# Patient Record
Sex: Female | Born: 1940 | Race: Black or African American | Hispanic: No | Marital: Single | Smoking: Former smoker
Health system: Southern US, Community
[De-identification: ages and names within clinical notes are randomized; demographics above are authoritative.]

## PROBLEM LIST (undated history)

## (undated) DIAGNOSIS — I10 Essential (primary) hypertension: Secondary | ICD-10-CM

## (undated) DIAGNOSIS — D494 Neoplasm of unspecified behavior of bladder: Secondary | ICD-10-CM

## (undated) DIAGNOSIS — Z862 Personal history of diseases of the blood and blood-forming organs and certain disorders involving the immune mechanism: Secondary | ICD-10-CM

## (undated) DIAGNOSIS — E785 Hyperlipidemia, unspecified: Secondary | ICD-10-CM

## (undated) DIAGNOSIS — R0600 Dyspnea, unspecified: Secondary | ICD-10-CM

## (undated) DIAGNOSIS — K219 Gastro-esophageal reflux disease without esophagitis: Secondary | ICD-10-CM

## (undated) DIAGNOSIS — T7840XA Allergy, unspecified, initial encounter: Secondary | ICD-10-CM

## (undated) DIAGNOSIS — Z8639 Personal history of other endocrine, nutritional and metabolic disease: Secondary | ICD-10-CM

## (undated) DIAGNOSIS — H269 Unspecified cataract: Secondary | ICD-10-CM

## (undated) DIAGNOSIS — I739 Peripheral vascular disease, unspecified: Secondary | ICD-10-CM

## (undated) DIAGNOSIS — M1990 Unspecified osteoarthritis, unspecified site: Secondary | ICD-10-CM

## (undated) DIAGNOSIS — M25569 Pain in unspecified knee: Secondary | ICD-10-CM

## (undated) DIAGNOSIS — J309 Allergic rhinitis, unspecified: Secondary | ICD-10-CM

## (undated) DIAGNOSIS — Z86010 Personal history of colonic polyps: Secondary | ICD-10-CM

## (undated) DIAGNOSIS — Z923 Personal history of irradiation: Secondary | ICD-10-CM

## (undated) HISTORY — PX: TOTAL ABDOMINAL HYSTERECTOMY: SHX209

## (undated) HISTORY — PX: BREAST SURGERY: SHX581

## (undated) HISTORY — DX: Hyperlipidemia, unspecified: E78.5

## (undated) HISTORY — DX: Personal history of diseases of the blood and blood-forming organs and certain disorders involving the immune mechanism: Z86.2

## (undated) HISTORY — DX: Allergy, unspecified, initial encounter: T78.40XA

## (undated) HISTORY — PX: TONSILLECTOMY: SHX5217

## (undated) HISTORY — PX: DILATION AND CURETTAGE OF UTERUS: SHX78

## (undated) HISTORY — DX: Essential (primary) hypertension: I10

## (undated) HISTORY — PX: COLONOSCOPY: SHX174

## (undated) HISTORY — DX: Personal history of other endocrine, nutritional and metabolic disease: Z86.39

## (undated) HISTORY — DX: Allergic rhinitis, unspecified: J30.9

## (undated) HISTORY — DX: Unspecified cataract: H26.9

## (undated) HISTORY — DX: Unspecified osteoarthritis, unspecified site: M19.90

## (undated) HISTORY — DX: Personal history of colonic polyps: Z86.010

---

## 1961-10-16 HISTORY — PX: TONSILLECTOMY: SHX5217

## 1973-10-16 HISTORY — PX: BREAST EXCISIONAL BIOPSY: SUR124

## 1998-01-25 ENCOUNTER — Ambulatory Visit (HOSPITAL_COMMUNITY): Admission: RE | Admit: 1998-01-25 | Discharge: 1998-01-25 | Payer: Self-pay | Admitting: Obstetrics & Gynecology

## 2000-07-19 ENCOUNTER — Encounter: Admission: RE | Admit: 2000-07-19 | Discharge: 2000-07-19 | Payer: Self-pay | Admitting: Internal Medicine

## 2000-07-19 ENCOUNTER — Encounter: Payer: Self-pay | Admitting: Internal Medicine

## 2001-06-25 ENCOUNTER — Other Ambulatory Visit: Admission: RE | Admit: 2001-06-25 | Discharge: 2001-06-25 | Payer: Self-pay | Admitting: Obstetrics & Gynecology

## 2001-07-23 ENCOUNTER — Encounter: Admission: RE | Admit: 2001-07-23 | Discharge: 2001-07-23 | Payer: Self-pay | Admitting: Internal Medicine

## 2001-07-23 ENCOUNTER — Encounter: Payer: Self-pay | Admitting: Internal Medicine

## 2001-09-03 ENCOUNTER — Encounter: Payer: Self-pay | Admitting: Obstetrics & Gynecology

## 2001-09-04 ENCOUNTER — Encounter (INDEPENDENT_AMBULATORY_CARE_PROVIDER_SITE_OTHER): Payer: Self-pay

## 2001-09-04 ENCOUNTER — Inpatient Hospital Stay (HOSPITAL_COMMUNITY): Admission: RE | Admit: 2001-09-04 | Discharge: 2001-09-07 | Payer: Self-pay | Admitting: Obstetrics & Gynecology

## 2002-07-25 ENCOUNTER — Encounter: Admission: RE | Admit: 2002-07-25 | Discharge: 2002-07-25 | Payer: Self-pay | Admitting: Internal Medicine

## 2002-07-25 ENCOUNTER — Encounter: Payer: Self-pay | Admitting: Internal Medicine

## 2003-07-28 ENCOUNTER — Encounter: Admission: RE | Admit: 2003-07-28 | Discharge: 2003-07-28 | Payer: Self-pay | Admitting: Internal Medicine

## 2003-07-28 ENCOUNTER — Encounter: Payer: Self-pay | Admitting: Internal Medicine

## 2004-07-28 ENCOUNTER — Encounter: Admission: RE | Admit: 2004-07-28 | Discharge: 2004-07-28 | Payer: Self-pay | Admitting: Internal Medicine

## 2004-11-04 ENCOUNTER — Ambulatory Visit: Payer: Self-pay | Admitting: Internal Medicine

## 2005-02-15 ENCOUNTER — Ambulatory Visit: Payer: Self-pay | Admitting: Internal Medicine

## 2005-06-06 ENCOUNTER — Ambulatory Visit: Payer: Self-pay | Admitting: Internal Medicine

## 2005-06-13 ENCOUNTER — Ambulatory Visit: Payer: Self-pay | Admitting: Internal Medicine

## 2005-06-16 ENCOUNTER — Ambulatory Visit: Payer: Self-pay | Admitting: Internal Medicine

## 2005-09-13 ENCOUNTER — Encounter: Admission: RE | Admit: 2005-09-13 | Discharge: 2005-09-13 | Payer: Self-pay | Admitting: Internal Medicine

## 2006-09-18 ENCOUNTER — Encounter: Admission: RE | Admit: 2006-09-18 | Discharge: 2006-09-18 | Payer: Self-pay | Admitting: Internal Medicine

## 2006-09-26 ENCOUNTER — Encounter: Admission: RE | Admit: 2006-09-26 | Discharge: 2006-09-26 | Payer: Self-pay | Admitting: Internal Medicine

## 2007-06-06 DIAGNOSIS — Z860100 Personal history of colon polyps, unspecified: Secondary | ICD-10-CM

## 2007-06-06 DIAGNOSIS — I10 Essential (primary) hypertension: Secondary | ICD-10-CM | POA: Insufficient documentation

## 2007-06-06 HISTORY — DX: Essential (primary) hypertension: I10

## 2007-06-06 HISTORY — DX: Personal history of colon polyps, unspecified: Z86.0100

## 2007-06-06 HISTORY — DX: Personal history of colonic polyps: Z86.010

## 2007-09-24 ENCOUNTER — Encounter: Admission: RE | Admit: 2007-09-24 | Discharge: 2007-09-24 | Payer: Self-pay | Admitting: Internal Medicine

## 2008-09-24 ENCOUNTER — Encounter: Admission: RE | Admit: 2008-09-24 | Discharge: 2008-09-24 | Payer: Self-pay | Admitting: Internal Medicine

## 2009-09-20 ENCOUNTER — Ambulatory Visit: Payer: Self-pay | Admitting: Internal Medicine

## 2009-09-20 DIAGNOSIS — J309 Allergic rhinitis, unspecified: Secondary | ICD-10-CM | POA: Insufficient documentation

## 2009-09-20 DIAGNOSIS — E785 Hyperlipidemia, unspecified: Secondary | ICD-10-CM

## 2009-09-20 HISTORY — DX: Hyperlipidemia, unspecified: E78.5

## 2009-09-20 HISTORY — DX: Allergic rhinitis, unspecified: J30.9

## 2009-09-21 DIAGNOSIS — Z862 Personal history of diseases of the blood and blood-forming organs and certain disorders involving the immune mechanism: Secondary | ICD-10-CM

## 2009-09-21 DIAGNOSIS — Z8639 Personal history of other endocrine, nutritional and metabolic disease: Secondary | ICD-10-CM | POA: Insufficient documentation

## 2009-09-21 HISTORY — DX: Personal history of diseases of the blood and blood-forming organs and certain disorders involving the immune mechanism: Z86.2

## 2009-09-21 HISTORY — DX: Personal history of diseases of the blood and blood-forming organs and certain disorders involving the immune mechanism: Z86.39

## 2009-09-21 LAB — CONVERTED CEMR LAB
ALT: 108 units/L — ABNORMAL HIGH (ref 0–35)
AST: 196 units/L — ABNORMAL HIGH (ref 0–37)
Albumin: 4.2 g/dL (ref 3.5–5.2)
Basophils Relative: 0.9 % (ref 0.0–3.0)
Calcium: 9.7 mg/dL (ref 8.4–10.5)
Chloride: 102 meq/L (ref 96–112)
Eosinophils Relative: 0.4 % (ref 0.0–5.0)
GFR calc non Af Amer: 106.67 mL/min (ref >60)
Glucose, Bld: 109 mg/dL — ABNORMAL HIGH (ref 70–99)
HDL: 74.9 mg/dL (ref >39.00)
Hemoglobin: 16.4 g/dL — ABNORMAL HIGH (ref 12.0–15.0)
Lymphs Abs: 1.6 10*3/uL (ref 0.7–4.0)
Monocytes Absolute: 0.7 10*3/uL (ref 0.1–1.0)
Monocytes Relative: 7.4 % (ref 3.0–12.0)
Potassium: 4 meq/L (ref 3.5–5.1)
RDW: 12 % (ref 11.5–14.6)
TSH: 1.89 microintl units/mL (ref 0.35–5.50)
Total CHOL/HDL Ratio: 3
Total Protein: 8.3 g/dL (ref 6.0–8.3)
Triglycerides: 103 mg/dL (ref 0.0–149.0)
VLDL: 20.6 mg/dL (ref 0.0–40.0)

## 2009-09-22 ENCOUNTER — Ambulatory Visit: Payer: Self-pay | Admitting: Internal Medicine

## 2009-09-22 LAB — CONVERTED CEMR LAB
HCV Ab: NEGATIVE
Hep B S Ab: NEGATIVE

## 2009-09-27 ENCOUNTER — Encounter: Admission: RE | Admit: 2009-09-27 | Discharge: 2009-09-27 | Payer: Self-pay | Admitting: Internal Medicine

## 2009-10-14 ENCOUNTER — Encounter: Admission: RE | Admit: 2009-10-14 | Discharge: 2009-10-14 | Payer: Self-pay | Admitting: Internal Medicine

## 2009-10-20 ENCOUNTER — Ambulatory Visit: Payer: Self-pay | Admitting: Internal Medicine

## 2010-03-04 ENCOUNTER — Telehealth: Payer: Self-pay | Admitting: Internal Medicine

## 2010-04-01 ENCOUNTER — Encounter (INDEPENDENT_AMBULATORY_CARE_PROVIDER_SITE_OTHER): Payer: Self-pay | Admitting: *Deleted

## 2010-04-06 ENCOUNTER — Ambulatory Visit: Payer: Self-pay | Admitting: Internal Medicine

## 2010-04-20 ENCOUNTER — Ambulatory Visit: Payer: Self-pay | Admitting: Internal Medicine

## 2010-04-20 LAB — HM COLONOSCOPY

## 2010-04-28 ENCOUNTER — Encounter: Payer: Self-pay | Admitting: Internal Medicine

## 2010-09-28 ENCOUNTER — Encounter
Admission: RE | Admit: 2010-09-28 | Discharge: 2010-09-28 | Payer: Self-pay | Source: Home / Self Care | Attending: Internal Medicine | Admitting: Internal Medicine

## 2010-11-15 NOTE — Miscellaneous (Signed)
Summary: [facility]evisit/prep  Clinical Lists Changes  Medications: Added new medication of MOVIPREP 100 GM  SOLR (PEG-KCL-NACL-NASULF-NA ASC-C) As per prep instructions. - Signed Rx of MOVIPREP 100 GM  SOLR (PEG-KCL-NACL-NASULF-NA ASC-C) As per prep instructions.;  #1 x 0;  Signed;  Entered by: Wyona Almas RN;  Authorized by: Iva Boop MD, [facility];  Method used: [facility] [address] [id number]*, [address] [place], [place], [place]  [id number], Ph: [phone] or [phone], Fax: [phone] Allergies: Changed allergy or adverse reaction from DOXYCYCLINE to DOXYCYCLINE Observations: Added new observation of ALLERGY REV: Done (04/06/2010 13:58)    Prescriptions: MOVIPREP 100 GM  SOLR (PEG-KCL-NACL-NASULF-NA ASC-C) As per prep instructions.  #1 x 0   Entered by:   Wyona Almas RN   Authorized by:   Iva Boop MD, [facility]   Signed by:   Wyona Almas RN on 04/06/2010   Method used:   [facility] [phone][facility] (retail)       [address]       [place], [place]  [id number]       Ph: [phone] or [phone]       Fax: [phone]   RxID:   [id number]

## 2010-11-15 NOTE — Progress Notes (Signed)
Summary: Schedule Recall Colonosopy   Phone Note Outgoing Call   Call placed by: Lamona Curl CMA Duncan Dull),  Mar 04, 2010 11:32 AM Call placed to: Patient Summary of Call: Patient is overdue for colonoscopy at this time. Her last colonoscopy in January 2005 showed diverticulosis and a polyp which was biopsied and found to be adenomatous. I have left a message for patient to call back.  Initial call taken by: Lamona Curl CMA Duncan Dull),  Mar 04, 2010 11:34 AM  Follow-up for Phone Call        Patient has been scheduled for June 22 for her previsit and July 6 for her colonoscopy. Follow-up by: Lamona Curl CMA Duncan Dull),  Mar 10, 2010 8:05 AM

## 2010-11-15 NOTE — Procedures (Signed)
Summary: Colonoscopy  Patient: Tracy Davidson Note: All result statuses are Final unless otherwise noted.  Tests: (1) Colonoscopy (COL)   COL Colonoscopy           DONE     [facility]     [address]     [place], [place]  [address]           COLONOSCOPY PROCEDURE REPORT           PATIENT:  Tracy Davidson, Tracy Davidson  MR#:  [id number]     BIRTHDATE:  02-23-1941, 68 yrs. old  GENDER:  female     ENDOSCOPIST:  Iva Boop, MD, [facility]           PROCEDURE DATE:  04/20/2010     PROCEDURE:  Colonoscopy with biopsy and snare polypectomy     ASA CLASS:  Class II     INDICATIONS:  surveillance and high-risk screening, history of     pre-cancerous (adenomatous) colon polyps diminutive adenoma     removed January 2005     MEDICATIONS:   Fentanyl 50 mcg IV, Versed 5 mg           DESCRIPTION OF PROCEDURE:   After the risks benefits and     alternatives of the procedure were thoroughly explained, informed     consent was obtained.  Digital rectal exam was performed and     revealed no abnormalities.   The Pentax Colonoscope [id number]     endoscope was introduced through the anus and advanced to the     cecum, which was identified by both the appendix and ileocecal     valve, without limitations.  The quality of the prep was     excellent, using MoviPrep.  The instrument was then slowly     withdrawn as the colon was fully examined. Insertion: 1:54 minutes     Withdrawal: 10:30 minutes.     <<PROCEDUREIMAGES>>           FINDINGS:  Five polyps were found. Cecum (2mm), descending (5 and     7 mm), sigmoid (4mm) and rectum (2mm). The polyps were removed     using cold biopsy forceps and were snared without cautery.     Retrieval was successful. AVM changes seen in cecum. Not bleeding     and up to 5mm size.  Mild diverticulosis was found in the sigmoid     colon.  Scattered diverticula were found in the right colon.  This     was otherwise a normal examination of the colon.   Retroflexed   views in the rectum revealed internal and external hemorrhoids.     The scope was then withdrawn from the patient and the procedure     completed.           COMPLICATIONS:  None     ENDOSCOPIC IMPRESSION:     1) Five polyps removed, 2-27mm.     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  REPEAT EXAM:  In for Colonoscopy, pending biopsy results.           Iva Boop, MD, [facility]           CC:  The Patient           n.     eSIGNED:   Iva Boop at 04/20/2010 11:24 AM           Page 2 of 3   Tracy Davidson, Tracy Davidson, [id number]  Note: An exclamation mark (!) indicates a result that was not dispersed into the flowsheet. Document Creation Date: 04/20/2010 11:24 AM _______________________________________________________________________  (1) Order result status: Final Collection or observation date-time: 04/20/2010 11:13 Requested date-time:  Receipt date-time:  Reported date-time:  Referring Physician:   Ordering Physician: Stan Head [phone]) Specimen Source:  Source: Launa Grill Order Number: [id number] Lab site:   Appended Document: Colonoscopy     Procedures Next Due Date:    Colonoscopy: 04/2013  Appended Document: Colonoscopy     Procedures Next Due Date:    Colonoscopy: 04/2013

## 2010-11-15 NOTE — Letter (Signed)
Summary: Patient Notice- Polyp Results  [facility]  [address] [place], [place] [address]   Phone: [phone]  Fax: [phone]        April 28, 2010 MRN: [id number]    [facility] [address] [place], [place]  [address]    Dear Ms. Follmer,  The polyps removed from your colon were adenomatous. This means that they were pre-cancerous or that  they had the potential to change into cancer over time.   I recommend that you have a repeat colonoscopy in 3 years to determine if you have developed any new polyps over time. If you develop any new rectal bleeding, abdominal pain or significant bowel habit changes, please contact us before then.  Please call us if you are having persistent problems or have questions about your condition that have not been fully answered at this time.  Sincerely,  Iva Boop MD, [facility]  This letter has been electronically signed by your physician.  Appended Document: Patient Notice- Polyp Results letter mailed.

## 2010-11-15 NOTE — Letter (Signed)
Summary: [facility] Instructions  [facility]  [address] [place], [place] [address]   Phone: [phone]  Fax: [phone]       ANISSIA WESSELLS    1941-07-20    MRN: [id number]        Procedure Day Dorna Bloom:  [facility]  04/20/10     Arrival Time:  9:30AM     Procedure Time:  10:30AM     Location of Procedure:                    _ X_  [facility] (4th Floor)                        PREPARATION FOR COLONOSCOPY WITH MOVIPREP   Starting 5 days prior to your procedure 04/15/10 do not eat nuts, seeds, popcorn, corn, beans, peas,  salads, or any raw vegetables.  Do not take any fiber supplements (e.g. Metamucil, Citrucel, and Benefiber).  THE DAY BEFORE YOUR PROCEDURE         DATE: 04/19/10  DAY: TUESDAY  1.  Drink clear liquids the entire day-NO SOLID FOOD  2.  Do not drink anything colored red or purple.  Avoid juices with pulp.  No orange juice.  3.  Drink at least 64 oz. (8 glasses) of fluid/clear liquids during the day to prevent dehydration and help the prep work efficiently.  CLEAR LIQUIDS INCLUDE: Water Jello Ice Popsicles Tea (sugar ok, no milk/cream) Powdered fruit flavored drinks Coffee (sugar ok, no milk/cream) Gatorade Juice: apple, white grape, white cranberry  Lemonade Clear bullion, consomm, broth Carbonated beverages (any kind) Strained chicken noodle soup Hard Candy                             4.  In the morning, mix first dose of MoviPrep solution:    Empty 1 Pouch A and 1 Pouch B into the disposable container    Add lukewarm drinking water to the top line of the container. Mix to dissolve    Refrigerate (mixed solution should be used within 24 hrs)  5.  Begin drinking the prep at 5:00 p.m. The MoviPrep container is divided by 4 marks.   Every 15 minutes drink the solution down to the next mark (approximately 8 oz) until the full liter is complete.   6.  Follow completed prep with 16 oz of clear liquid of your choice  (Nothing red or purple).  Continue to drink clear liquids until bedtime.  7.  Before going to bed, mix second dose of MoviPrep solution:    Empty 1 Pouch A and 1 Pouch B into the disposable container    Add lukewarm drinking water to the top line of the container. Mix to dissolve    Refrigerate  THE DAY OF YOUR PROCEDURE      DATE: 04/20/10  DAY: WEDNESDAY  Beginning at 5:30AM (5 hours before procedure):         1. Every 15 minutes, drink the solution down to the next mark (approx 8 oz) until the full liter is complete.  2. Follow completed prep with 16 oz. of clear liquid of your choice.    3. You may drink clear liquids until 8:30AM (2 HOURS BEFORE PROCEDURE).   MEDICATION INSTRUCTIONS  Unless otherwise instructed, you should take regular prescription medications with a small sip of water   as early as possible the morning  of your procedure.  Additional medication instructions: Hold Hydrochlorothiazide the morning of procedure.         OTHER INSTRUCTIONS  You will need a responsible adult at least 71 years of age to accompany you and drive you home.   This person must remain in the waiting room during your procedure.  Wear loose fitting clothing that is easily removed.  Leave jewelry and other valuables at home.  However, you may wish to bring a book to read or  an iPod/MP3 player to listen to music as you wait for your procedure to start.  Remove all body piercing jewelry and leave at home.  Total time from sign-in until discharge is approximately 2-3 hours.  You should go home directly after your procedure and rest.  You can resume normal activities the  day after your procedure.  The day of your procedure you should not:   Drive   Make legal decisions   Operate machinery   Drink alcohol   Return to work  You will receive specific instructions about eating, activities and medications before you leave.    The above instructions have been reviewed and  explained to me by   Wyona Almas RN  April 06, 2010 2:23 PM     I fully understand and can verbalize these instructions _____________________________ Date _________

## 2010-11-15 NOTE — Assessment & Plan Note (Signed)
Summary: per PK   Vital Signs:  Patient profile:   70 year old female Weight:      170 pounds Temp:     98.6 degrees F oral BP sitting:   138 / 74  (left arm) Cuff size:   regular  Vitals Entered By: Raechel Ache, RN (October 20, 2009 11:15 AM) CC: Talk about LFT's. Is Patient Diabetic? No   CC:  Talk about LFT's..  History of Present Illness: 70 year old patient who is seen today for follow-up of her elevated liver function studies.  A chronic hepatitis screen has been negative as has an abdominal ultrasound.  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  She has hypertension, history of colonic polyps, and dyslipidemia.  Her only medication is hydrochlorothiazide.  She feels well today.  Allergies: 1)  ! Doxycycline  Past History:  Past Medical History: Reviewed history from 09/20/2009 and no changes required. Bipolar High Cholesterol Colonic polyps, hx of Hypertension Allergic rhinitis  Physical Exam  General:  Well-developed,well-nourished,in no acute distress; alert,appropriate and cooperative throughout examination Head:  Normocephalic and atraumatic without obvious abnormalities. No apparent alopecia or balding. Eyes:  No corneal or conjunctival inflammation noted. EOMI. Perrla. Funduscopic exam benign, without hemorrhages, exudates or papilledema. Vision grossly normal. Mouth:  Oral mucosa and oropharynx without lesions or exudates.  Teeth in good repair. Neck:  No deformities, masses, or tenderness noted. Lungs:  Normal respiratory effort, chest expands symmetrically. Lungs are clear to auscultation, no crackles or wheezes. Heart:  Normal rate and regular rhythm. S1 and S2 normal without gallop, murmur, click, rub or other extra sounds. Abdomen:  Bowel sounds positive,abdomen soft and non-tender without masses, organomegaly or hernias noted.   Impression & Recommendations:  Problem # 1:  LIVER FUNCTION TESTS, ABNORMAL, HX OF (ICD-V12.2)  Problem #  2:  HYPERTENSION (ICD-401.9)  Her updated medication list for this problem includes:    Hydrochlorothiazide 25 Mg Tabs (Hydrochlorothiazide) .Marland Kitchen... 1 once daily  Her updated medication list for this problem includes:    Hydrochlorothiazide 25 Mg Tabs (Hydrochlorothiazide) .Marland Kitchen... 1 once daily  Complete Medication List: 1)  Hydrochlorothiazide 25 Mg Tabs (Hydrochlorothiazide) .Marland Kitchen.. 1 once daily  Patient Instructions: 1)  Please schedule a follow-up appointment in 3 months. 2)  Limit your Sodium (Salt). 3)  It is important that you exercise regularly at least 20 minutes 5 times a week. If you develop chest pain, have severe difficulty breathing, or feel very tired , stop exercising immediately and seek medical attention. 4)  discontinue alsoholic products

## 2010-12-13 ENCOUNTER — Other Ambulatory Visit: Payer: Self-pay | Admitting: Internal Medicine

## 2011-02-08 ENCOUNTER — Other Ambulatory Visit: Payer: Self-pay | Admitting: Internal Medicine

## 2011-03-03 NOTE — Discharge Summary (Signed)
[facility]  Patient:    Tracy Davidson, Tracy Davidson Visit Number: [id number] MRN: [id number]          Service Type: GYN Location: 9300 [id number] Attending Physician:  Mickle Mallory Dictated by:   Gerrit Friends. Aldona Bar, M.D. Admit Date:  09/04/2001 Discharge Date: 09/07/2001                             Discharge Summary  DISCHARGE DIAGNOSIS:          Persistent postmenopausal bleeding secondary                               to 8-cm submucosal leiomyoma.  PROCEDURES:                   1. Total abdominal hysterectomy.                               2. Bilateral salpingo-oophorectomy.  SURGEON:                      Gerrit Friends. Aldona Bar, M.D.  HISTORY:                      This 70 year old patient was admitted for persistent postmenopausal bleeding suspected to be coming from a large submucosal leiomyoma. She was not on hormone replacement.  HOSPITAL COURSE:              After a normal preoperative workup, she underwent a total abdominal hysterectomy and bilateral salpingo-oophorectomy on November 20 which was carried out without difficulty. Pathologic specimen revealed an 8-cm submucosal leiomyoma and an endometrial polyp. Tubes and ovaries and the remainder of the specimen was normal. The patients postoperative course was relatively uncomplicated. Her discharge hemoglobin was 14.2 with a white count of 12,800 and platelet count of 182,000. She did well on November 21. 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 She was placed on Atarax with improvement and on the morning of November 23, was ambulating well, tolerating a regular diet well, having minimal dizziness, was having normal bowel and bladder functions, her wound was clean and dry, her vital signs were stable, and she was deemed ready for discharge.  DISCHARGE MEDICATIONS:        1. Darvocet-N 100 one  every four hours as needed                                  for pain.                               2. Atarax 25 mg one every six to eight hours as                                  needed for dizziness.  DISCHARGE FOLLOWUP:           The patient is to return to the office for followup in approximately four weeks time. She was given all careful explicit instructions at the time  of discharge and understood all instructions well.  CONDITION ON DISCHARGE:       Improved. Dictated by:   Gerrit Friends. Aldona Bar, M.D. Attending Physician:  Mickle Mallory DD:  09/07/01 TD:  09/07/01 Job: [id number]

## 2011-03-03 NOTE — H&P (Signed)
[facility]  Patient:    Tracy, Davidson Visit Number: [id number] MRN: [id number]          Service Type: Attending:  Gerrit Friends. Aldona Bar, M.D. Dictated by:   Gerrit Friends. Aldona Bar, M.D. Adm. Date:  09/04/01                           History and Physical  OFFICE NUMBER:                [id number]  REASON FOR ADMISSION:         This patient is being admitted after surgery on Wednesday, September 04, 2001, at [facility].  Please try to have this history and physical to the Day Surgery Admitting area on the morning of September 04, 2001.  HISTORY:                      Tracy Davidson is a 70 year old gravida 1, para 0, aborta 1, black female, admitted with a persistent minimal postmenopausal bleeding for a total abdominal hysterectomy and bilateral salpingo-oophorectomy.  This patient has known fibroids and is on no hormone replacement therapy and has been on no hormone replacement therapy for at least five years and continues to have some intermittent spotting which, on evaluation, has always been benign.  She has had two ultrasounds done, the most recent of which was at [facility] in April 1999.  At that time, a sonohysterogram was done, and there was a submucosal fibroid within the endometrial cavity, and the endometrial lining was noted to be thin, and normal ovaries were seen.  It is the fact that she is postmenopausal, not on any hormone replacement, and having continued postmenopausal spotting that she is undergoing this procedure.  Her most recent Pap smear is September 2002 was normal, and a mammogram was done October 2002, which reportedly, according to the patient, was normal.  She has been a patient at [facility] for a number of years - dating back to 1974.  I have been seeing her personally since 67.  Concurrently, she also sees Dr. Eleonore Chiquito in the [facility] and prior to that, was a patient of Dr. Rhys Martini  and Dr. Leanora Ivanoff when they were in private practice together.  Her only medical problem is hypertension which is controlled with hydrochlorothiazide 25 mg daily.  She is on no other medications.  She has had no other serious illnesses.  ALLERGIES:                    She has essentially no known allergies.  HABITS:                       She does smoke socially and does use alcohol socially.  PAST MEDICAL HISTORY:         Essentially negative with the exception of above.  FAMILY HISTORY:               Noncontributory.  REVIEW OF SYSTEMS:            Noncontributory.  PHYSICAL EXAMINATION AT ADMISSION:  GENERAL:                      Well-developed female.  VITAL SIGNS:                  Blood pressure 140/70, pulse 80 and regular, respirations 18  and regular, temperature 98.2, weight 183, height 5 feet 5 inches.  HEENT:                        Negative.  NECK:                         Thyroid not enlarged.  CHEST:                        Clear to auscultation and percussion.  CARDIOVASCULAR:               Regular rate and rhythm with no murmur.  BREASTS:                      Negative.  ABDOMEN:                      Unremarkable.  PELVIC:                       External genitalia, BUS normal.  Introitus by digital, vault fairly well-supported.  Cervix - no gross lesions.  Uterus is upper limits of normal size, deviated somewhat to the right.  Adnexal area is negative.  Rectovaginal confirmatory.  NEUROLOGIC:                   Physiologic.  IMPRESSION:                   1. Persistent postmenopausal bleeding.                               2. 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 degeneration.  PLAN:                         1. Total abdominal hysterectomy.                               2. Bilateral salpingo-oophorectomy. Dictated by:   Gerrit Friends. Aldona Bar, M.D. Attending:  Gerrit Friends. Aldona Bar, M.D. DD:  09/03/01 TD:  09/03/01 Job:  [id number]

## 2011-03-03 NOTE — Op Note (Signed)
[facility]  Patient:    Tracy Davidson, Tracy Davidson Visit Number: [id number] MRN: [id number]          Service Type: GYN Location: [id number] Attending Physician:  Mickle Mallory Dictated by:   Gerrit Friends. Aldona Bar, M.D. Proc. Date: 09/04/01 Admit Date:  09/04/2001                             Operative Report  PREOPERATIVE DIAGNOSES:       1. Persistent postmenopausal bleeding.                               2. Leiomyomatous uterus.  POSTOPERATIVE DIAGNOSES:      1. Persistent postmenopausal bleeding.                               2. Leiomyomatous uterus.                               3. Pathology pending.  PROCEDURE:                    Total abdominal hysterectomy with bilateral salpingo-oophorectomy.  SURGEON:                      Gerrit Friends. Aldona Bar, M.D.  ASSISTANT:                    Luvenia Redden, M.D.  ANESTHESIA:                   General endotracheal.  HISTORY:                      This 70 year old female who has not been on hormone replacement therapy for a number of years, continues to have postmenopausal spotting.  In spite of a totally normal evaluation with the exception of large leiomyoma, the problem continues.  Her workup has included a sonohystogram, a D&C, several ultrasounds, several endometrial biopsies, and even a course of Evista without success.  She is now taken to the operating room for total abdominal hysterectomy and bilateral salpingo-oophorectomy.  DESCRIPTION OF PROCEDURE:     The patient was taken to the operating room. After satisfactory induction of general endotracheal anesthesia, she was prepped and draped in the supine position with a Foley catheter inserted as part of the prep.  After the patient was adequately draped, the procedure was begun.  A Pfannenstiel incision was made and, with minimal difficulty, dissected down to and through the fascia in a low transverse fashion with hemostasis created at each  layer.  The subfascial space was created inferiorly and superiorly.  The muscles were separated in the midline.  The peritoneum was identified and entered appropriately, with care taken to avoid the bowel superiorly and the bladder inferiorly.  At this time, the abdomen was inspected.  The liver edge and the undersurface of the diaphragm felt normal, as did both kidneys.  The uterus was enlarged with what was felt to be a large posterior myoma.  The ovaries were atrophic.  The tubes were normal.  The appendix was totally normal.  At this time, the OConnor-OSullivan retractor was placed and bowel was packed off without difficulty.  Hysterectomy  at this time was begun in the usual fashion.  Two long Kelly clamps were placed at the cornua of the uterus and the uterus was elevated.  The round ligaments were tied off using 0 Vicryl.  Using the Bovie, the round ligaments were opened and rendered hemostatic, and a bladder flap was created anteriorly without difficulty.  At this time, the ureters were palpated and noted to be well out of the field. The infundibulopelvic pedicles were clamped, cut and doubly suture secured, removing both ovaries and tubes.  At this time, the anterior flap was created, pushing the bladder down off of the lower uterine segment.  The uterine artery pedicles at this time were clamped, cut and suture secured with 0 Vicryl suture.  Because of the large posterior myoma obstructing view, the fundal portion of the uterus was removed above the uterine artery pedicles.  This enabled great visualization. Thereafter, again assured that the bladder was pushed off of the lower segment, using straight Heaney clamps and suture ligature of 0 Vicryl, the parametrial pedicles were taken down to the vaginal angle and the uterosacrals were then clamped with a curved Heaney and suture secured with 0 Vicryl suture.  At this time, a stab wound was made anteriorly and, using the Satinsky  scissors, the cervix was circumferentially removed without difficulty.  Thereafter, the vaginal angles were secured using a figure-of-eight 0 Vicryl suture and the vaginal cuff was closed using figure-of-eight 0 Vicryl suture, as well.  Hemostasis was noted to be very adequate.  Again, the ureters were palpated and noted to be normal in their course.  With hemostasis very adequate and the procedure completed, removal of the packs and retractors was carried out.  Again, the appendix was visualized and palpated and noted to be completely normal.  At this time, with all counts being correct and no foreign bodies noted to be remaining in the abdominal cavity, closure of the abdomen was carried out in layers.  The abdominal peritoneum was closed with 2-0 Vicryl in a running fashion.  The muscles were secured with the same.  Assured of fascial hemostasis, the fascia was then reapproximated using 0 Vicryl from angle to midline bilaterally.  The subcu tissue was then rendered hemostatic and staples were then used to close the skin.  A sterile pressure dressing was applied.  At this time, the patient was transported to the recovery room in satisfactory condition, having tolerated the procedure well.  Estimated blood loss was 150 cc.  All counts were correct x 2.  The patient had good, clear urine output during the procedure and was taken to the recovery room in satisfactory condition.  PATHOLOGY SPECIMEN:           Uterus, tubes and cervix.  CONDITION ON ARRIVAL TO THE RECOVERY ROOM:         Satisfactory. Dictated by:   Gerrit Friends. Aldona Bar, M.D. Attending Physician:  Mickle Mallory DD:  09/04/01 TD:  09/04/01 Job: [id number]

## 2011-03-09 ENCOUNTER — Encounter: Payer: Self-pay | Admitting: Internal Medicine

## 2011-03-14 ENCOUNTER — Ambulatory Visit (INDEPENDENT_AMBULATORY_CARE_PROVIDER_SITE_OTHER): Payer: PRIVATE HEALTH INSURANCE | Admitting: Internal Medicine

## 2011-03-14 ENCOUNTER — Encounter: Payer: Self-pay | Admitting: Internal Medicine

## 2011-03-14 VITALS — BP 116/70 | HR 70 | Temp 98.5°F | Resp 18 | Ht 64.5 in | Wt 167.0 lb

## 2011-03-14 DIAGNOSIS — E785 Hyperlipidemia, unspecified: Secondary | ICD-10-CM

## 2011-03-14 DIAGNOSIS — Z860100 Personal history of colon polyps, unspecified: Secondary | ICD-10-CM

## 2011-03-14 DIAGNOSIS — Z23 Encounter for immunization: Secondary | ICD-10-CM

## 2011-03-14 DIAGNOSIS — Z0000 Encounter for general adult medical examination without abnormal findings: Secondary | ICD-10-CM

## 2011-03-14 DIAGNOSIS — I10 Essential (primary) hypertension: Secondary | ICD-10-CM

## 2011-03-14 LAB — BASIC METABOLIC PANEL
BUN: 11 mg/dL (ref 6–23)
CO2: 28 mEq/L (ref 19–32)
Chloride: 102 mEq/L (ref 96–112)

## 2011-03-14 LAB — CBC WITH DIFFERENTIAL/PLATELET
Basophils Relative: 0.3 % (ref 0.0–3.0)
HCT: 48.6 % — ABNORMAL HIGH (ref 36.0–46.0)
Hemoglobin: 16.8 g/dL — ABNORMAL HIGH (ref 12.0–15.0)
Lymphocytes Relative: 17 % (ref 12.0–46.0)
Neutrophils Relative %: 76.9 % (ref 43.0–77.0)
Platelets: 147 10*3/uL — ABNORMAL LOW (ref 150.0–400.0)
RDW: 13.1 % (ref 11.5–14.6)

## 2011-03-14 LAB — HEPATIC FUNCTION PANEL
Total Bilirubin: 1.8 mg/dL — ABNORMAL HIGH (ref 0.3–1.2)
Total Protein: 7.3 g/dL (ref 6.0–8.3)

## 2011-03-14 LAB — LIPID PANEL
Cholesterol: 205 mg/dL — ABNORMAL HIGH (ref 0–200)
HDL: 65.6 mg/dL (ref >39.00)
Triglycerides: 123 mg/dL (ref 0.0–149.0)
VLDL: 24.6 mg/dL (ref 0.0–40.0)

## 2011-03-14 LAB — TSH: TSH: 2.42 u[IU]/mL (ref 0.35–5.50)

## 2011-03-14 MED ORDER — HYDROCHLOROTHIAZIDE 25 MG PO TABS: 25.0000 mg | ORAL_TABLET | Freq: Every day | ORAL | Status: DC

## 2011-03-14 NOTE — Patient Instructions (Signed)
Limit your sodium (Salt) intake    It is important that you exercise regularly, at least 20 minutes 3 to 4 times per week.  If you develop chest pain or shortness of breath seek  medical attention.  Please check your blood pressure on a regular basis.  If it is consistently greater than 150/90, please make an office appointment.  Return in 6 months for follow-up  Take a calcium supplement, plus 800-1200 units of vitamin D 

## 2011-03-14 NOTE — Progress Notes (Signed)
Subjective:    Patient ID: Tracy Davidson, female    DOB: Jul 25, 1941, 70 y.o.   MRN: [id number]  HPI  70 year old patient seen today for an annual exam medical problems include hypertension well controlled on diuretic therapy;  she has a history of colonic polyps and underwent a colonoscopy in July of last year. She's had a total abdominal hysterectomy in 2002. 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 She has a history of hypertension, treated with diuretic therapy in the past. She has been out of her medication. She has a history of colonic polyps and was notified earlier this year about follow-up colonoscopy. She takes no chronic medications. No concerns or complaints today. She also has a history mauled dyslipidemia. That apparently has not been treated.  Preventive Screening-Counseling & Management  Alcohol-Tobacco  Smoking Cessation Counseling: yes  Allergies:  1) ! Doxycycline  Past History:  Past Medical History:  Bipolar  High Cholesterol  Colonic polyps, hx of  Hypertension  Allergic rhinitis  Past Surgical History:  TAH  Breast Biopsy  D&C  T/A 1963  Hysterectomy 2002 colonoscopy 2005, July 2011  Family History:  Reviewed history from 06/06/2007 and no changes required.  Family History of Bowel disease  Family History of CAD Female 1st degree relative <50  Family History Diabetes 1st degree relative  Family History of Cardiovascular disorder  Father-died patient age 31 ?intestinal blockage  Mother died age 32 MI, PVOD  One brother-died DM, PVOD, CAD  Four sisters- 3 deceased: MI  Social History:  Reviewed history from 06/06/2007 and no changes required.  Occupation: Land  Single  Current Smoker  Alcohol use-yes  1. Risk factors, based on past  M,S,F history-  cardiovascular risk factors include hypertension and family history of coronary  artery disease    2.  Physical activities:  No exercise limitations but fairly sedentary   3. Hearing: No deficits  4.  Depression-  history depression in the past but no recent treatment 5.  ADL's:  Independent in all aspects of daily living   6.  Fall risk:  Low  7.  Home safety:  No problems identified    8.  Height weight, and visual acuity;  height and weight stable no change in visual acuity   9.  Counseling:  More regular exercise heart healthy diet calcium and vitamin D encouraged    10. Lab orders based on risk factors:  Laboratory profile including lipid panel will be reviewed       11. Referral:  Not appropriate at this time      12. Care plan:  Heart healthy diet encouraged     13. Cognitive assessment:  Alert and oriented with normal affect no cognitive dysfunction       Review of Systems  Constitutional: Negative for fever, appetite change, fatigue and unexpected weight change.  HENT: Negative for hearing loss, ear pain, nosebleeds, congestion, sore throat, mouth sores, trouble swallowing, neck stiffness, dental problem, voice change, sinus pressure and tinnitus.   Eyes: Negative for photophobia, pain, redness and visual disturbance.  Respiratory: Negative for cough, chest tightness and shortness of breath.   Cardiovascular: Negative for chest pain, palpitations and leg swelling.  Gastrointestinal: Negative for nausea, vomiting, abdominal pain, diarrhea, constipation, blood in stool, abdominal distention and rectal pain.  Genitourinary: Negative for dysuria, urgency, frequency, hematuria,  flank pain, vaginal bleeding, vaginal discharge, difficulty urinating, genital sores, vaginal pain, menstrual problem and pelvic pain.  Musculoskeletal: Negative for back pain and arthralgias.  Skin: Negative for rash.  Neurological: Negative for dizziness, syncope, speech difficulty, weakness, light-headedness, numbness and headaches.  Hematological: Negative for adenopathy. Does  not bruise/bleed easily.  Psychiatric/Behavioral: Negative for suicidal ideas, behavioral problems, self-injury, dysphoric mood and agitation. The patient is not nervous/anxious.        Objective:   Physical Exam  Constitutional: She is oriented to person, place, and time. She appears well-developed and well-nourished.  HENT:  Head: Normocephalic and atraumatic.  Right Ear: External ear normal.  Left Ear: External ear normal.  Mouth/Throat: Oropharynx is clear and moist.  Eyes: Conjunctivae and EOM are normal.  Neck: Normal range of motion. Neck supple. No JVD present. No thyromegaly present.  Cardiovascular: Normal rate, regular rhythm, normal heart sounds and intact distal pulses.   No murmur heard. Pulmonary/Chest: Effort normal and breath sounds normal. She has no wheezes. She has no rales.  Abdominal: Soft. Bowel sounds are normal. She exhibits no distension and no mass. There is no tenderness. There is no rebound and no guarding.  Musculoskeletal: Normal range of motion. She exhibits no edema and no tenderness.  Neurological: She is alert and oriented to person, place, and time. She has normal reflexes. No cranial nerve deficit. She exhibits normal muscle tone. Coordination normal.  Skin: Skin is warm and dry. No rash noted.  Psychiatric: She has a normal mood and affect. Her behavior is normal.          Assessment & Plan:   Annual health assessment  Hypertension stable  We'll continue present regimen heart healthy diet encouraged calcium and vitamin D encouraged as well as more regular exercise. We'll recheck in 6 months

## 2011-04-11 ENCOUNTER — Other Ambulatory Visit: Payer: Self-pay | Admitting: Internal Medicine

## 2011-06-06 ENCOUNTER — Other Ambulatory Visit: Payer: Self-pay | Admitting: Family Medicine

## 2011-08-07 ENCOUNTER — Other Ambulatory Visit: Payer: Self-pay | Admitting: Family Medicine

## 2011-08-24 ENCOUNTER — Other Ambulatory Visit: Payer: Self-pay | Admitting: Internal Medicine

## 2011-08-24 DIAGNOSIS — Z1231 Encounter for screening mammogram for malignant neoplasm of breast: Secondary | ICD-10-CM

## 2011-10-02 ENCOUNTER — Ambulatory Visit
Admission: RE | Admit: 2011-10-02 | Discharge: 2011-10-02 | Disposition: A | Discharge status: Home / Self Care | Payer: PRIVATE HEALTH INSURANCE | Source: Ambulatory Visit | Attending: Internal Medicine | Admitting: Internal Medicine

## 2011-10-02 DIAGNOSIS — Z1231 Encounter for screening mammogram for malignant neoplasm of breast: Secondary | ICD-10-CM

## 2011-10-03 ENCOUNTER — Other Ambulatory Visit: Payer: Self-pay | Admitting: Family Medicine

## 2011-12-04 ENCOUNTER — Other Ambulatory Visit: Payer: Self-pay | Admitting: Family Medicine

## 2012-03-14 ENCOUNTER — Encounter: Payer: Self-pay | Admitting: Internal Medicine

## 2012-03-14 ENCOUNTER — Ambulatory Visit (INDEPENDENT_AMBULATORY_CARE_PROVIDER_SITE_OTHER): Payer: PRIVATE HEALTH INSURANCE | Admitting: Internal Medicine

## 2012-03-14 VITALS — BP 140/80 | HR 82 | Temp 98.0°F | Resp 18 | Ht 64.5 in | Wt 168.0 lb

## 2012-03-14 DIAGNOSIS — J309 Allergic rhinitis, unspecified: Secondary | ICD-10-CM

## 2012-03-14 DIAGNOSIS — Z0000 Encounter for general adult medical examination without abnormal findings: Secondary | ICD-10-CM

## 2012-03-14 DIAGNOSIS — Z720 Tobacco use: Secondary | ICD-10-CM | POA: Insufficient documentation

## 2012-03-14 DIAGNOSIS — Z862 Personal history of diseases of the blood and blood-forming organs and certain disorders involving the immune mechanism: Secondary | ICD-10-CM

## 2012-03-14 DIAGNOSIS — I10 Essential (primary) hypertension: Secondary | ICD-10-CM

## 2012-03-14 DIAGNOSIS — Z136 Encounter for screening for cardiovascular disorders: Secondary | ICD-10-CM

## 2012-03-14 DIAGNOSIS — E785 Hyperlipidemia, unspecified: Secondary | ICD-10-CM

## 2012-03-14 DIAGNOSIS — Z8639 Personal history of other endocrine, nutritional and metabolic disease: Secondary | ICD-10-CM

## 2012-03-14 LAB — CBC WITH DIFFERENTIAL/PLATELET
Basophils Absolute: 0 10*3/uL (ref 0.0–0.1)
Basophils Relative: 0.3 % (ref 0.0–3.0)
HCT: 48.4 % — ABNORMAL HIGH (ref 36.0–46.0)
Hemoglobin: 16 g/dL — ABNORMAL HIGH (ref 12.0–15.0)
Lymphocytes Relative: 16.4 % (ref 12.0–46.0)
Lymphs Abs: 1.8 10*3/uL (ref 0.7–4.0)
MCHC: 33.1 g/dL (ref 30.0–36.0)
MCV: 102.8 fl — ABNORMAL HIGH (ref 78.0–100.0)
Neutrophils Relative %: 76.9 % (ref 43.0–77.0)
Platelets: 144 10*3/uL — ABNORMAL LOW (ref 150.0–400.0)
RBC: 4.71 Mil/uL (ref 3.87–5.11)
RDW: 13.6 % (ref 11.5–14.6)

## 2012-03-14 LAB — LIPID PANEL
Cholesterol: 187 mg/dL (ref 0–200)
HDL: 59.8 mg/dL (ref >39.00)
VLDL: 20.4 mg/dL (ref 0.0–40.0)

## 2012-03-14 LAB — COMPREHENSIVE METABOLIC PANEL
AST: 95 U/L — ABNORMAL HIGH (ref 0–37)
Alkaline Phosphatase: 63 U/L (ref 39–117)
Calcium: 9.3 mg/dL (ref 8.4–10.5)
Glucose, Bld: 121 mg/dL — ABNORMAL HIGH (ref 70–99)

## 2012-03-14 LAB — TSH: TSH: 2.54 u[IU]/mL (ref 0.35–5.50)

## 2012-03-14 MED ORDER — HYDROCHLOROTHIAZIDE 25 MG PO TABS: 25.0000 mg | ORAL_TABLET | Freq: Every day | ORAL | Status: DC

## 2012-03-14 NOTE — Progress Notes (Signed)
Patient ID: Tracy Davidson, female   DOB: 09-11-1941, 71 y.o.   MRN: [id number]  Subjective:    Patient ID: Tracy Davidson, female    DOB: Jun 20, 1941, 71 y.o.   MRN: [id number]  Hypertension Pertinent negatives include no chest pain, headaches, palpitations or shortness of breath.  71 -year-old patient seen today for an annual exam;  medical problems include hypertension well controlled on diuretic therapy;  she has a history of colonic polyps and underwent a colonoscopy in July of last year. She's had a total abdominal hysterectomy in 2002. She has a history mild LFT  related to EtOH. No concerns or complaints today     Preventive Screening-Counseling & Management  Alcohol-Tobacco  Smoking Cessation Counseling: yes   Allergies:  1) ! Doxycycline   Past History:  Past Medical History:  Bipolar  High Cholesterol  Colonic polyps, hx of  Hypertension  Allergic rhinitis   Past Surgical History:  TAH  Breast Biopsy  D&C  T/A 1963  Hysterectomy 2002 colonoscopy 2005, July 2011   Family History:  Reviewed history from 06/06/2007 and no changes required.  Family History of Bowel disease  Family History of CAD Female 1st degree relative <50  Family History Diabetes 1st degree relative  Family History of Cardiovascular disorder  Father-died patient age 72 ?intestinal blockage  Mother died age 725 MI, PVOD  One brother-died DM, PVOD, CAD  Four sisters- 3 deceased: MI   Social History:  Reviewed history from 06/06/2007 and no changes required.  Occupation: Land  Single  Current Smoker  Alcohol use-yes  1. Risk factors, based on past  M,S,F history-  cardiovascular risk factors include hypertension and family history of coronary artery disease    2.  Physical activities:  No exercise limitations but fairly sedentary   3. Hearing: No deficits  4.  Depression-  history depression in the past but no recent treatment 5.  ADL's:  Independent in all aspects of  daily living   6.  Fall risk:  Low  7.  Home safety:  No problems identified    8.  Height weight, and visual acuity;  height and weight stable no change in visual acuity   9.  Counseling:  More regular exercise heart healthy diet calcium and vitamin D encouraged    10. Lab orders based on risk factors:  Laboratory profile including lipid panel will be reviewed       11. Referral:  Not appropriate at this time      12. Care plan:  Heart healthy diet encouraged     13. Cognitive assessment:  Alert and oriented with normal affect no cognitive dysfunction       Review of Systems  Constitutional: Negative for fever, appetite change, fatigue and unexpected weight change.  HENT: Negative for hearing loss, ear pain, nosebleeds, congestion, sore throat, mouth sores, trouble swallowing, neck stiffness, dental problem, voice change, sinus pressure and tinnitus.   Eyes: Negative for photophobia, pain, redness and visual disturbance.  Respiratory: Negative for cough, chest tightness and shortness of breath.   Cardiovascular: Negative for chest pain, palpitations and leg swelling.  Gastrointestinal: Negative for nausea, vomiting, abdominal pain, diarrhea, constipation, blood in stool, abdominal distention and rectal pain.  Genitourinary: Negative for dysuria, urgency, frequency, hematuria, flank pain, vaginal bleeding, vaginal discharge, difficulty urinating, genital sores, vaginal pain, menstrual problem and pelvic pain.  Musculoskeletal: Negative for back pain and arthralgias.  Skin: Negative for rash.  Neurological: Negative for dizziness, syncope,  speech difficulty, weakness, light-headedness, numbness and headaches.  Hematological: Negative for adenopathy. Does not bruise/bleed easily.  Psychiatric/Behavioral: Negative for suicidal ideas, behavioral problems, self-injury, dysphoric mood and agitation. The patient is not nervous/anxious.        Objective:   Physical Exam  Constitutional:  She is oriented to person, place, and time. She appears well-developed and well-nourished.  HENT:  Head: Normocephalic and atraumatic.  Right Ear: External ear normal.  Left Ear: External ear normal.  Mouth/Throat: Oropharynx is clear and moist.  Eyes: Conjunctivae and EOM are normal.  Neck: Normal range of motion. Neck supple. No JVD present. No thyromegaly present.  Cardiovascular: Normal rate, regular rhythm, normal heart sounds and intact distal pulses.   No murmur heard. Pulmonary/Chest: Effort normal and breath sounds normal. She has no wheezes. She has no rales.  Abdominal: Soft. Bowel sounds are normal. She exhibits no distension and no mass. There is no tenderness. There is no rebound and no guarding.  Musculoskeletal: Normal range of motion. She exhibits no edema and no tenderness.  Neurological: She is alert and oriented to person, place, and time. She has normal reflexes. No cranial nerve deficit. She exhibits normal muscle tone. Coordination normal.  Skin: Skin is warm and dry. No rash noted.  Psychiatric: She has a normal mood and affect. Her behavior is normal.          Assessment & Plan:   Annual health assessment  Hypertension stable  We'll continue present regimen heart healthy diet encouraged calcium and vitamin D encouraged as well as more regular exercise. We'll recheck in 6 months

## 2012-03-14 NOTE — Patient Instructions (Signed)
Please check your blood pressure on a regular basis.  If it is consistently greater than 150/90, please make an office appointment.  Limit your sodium (Salt) intake  Smoking tobacco is very bad for your health. You should stop smoking immediately.  Take a calcium supplement, plus [phone] units of vitamin D    It is important that you exercise regularly, at least 20 minutes 3 to 4 times per week.  If you develop chest pain or shortness of breath seek  medical attention.

## 2012-03-15 ENCOUNTER — Other Ambulatory Visit: Payer: Self-pay | Admitting: Internal Medicine

## 2012-03-15 MED ORDER — POTASSIUM CHLORIDE CRYS ER 20 MEQ PO TBCR: 20.0000 meq | EXTENDED_RELEASE_TABLET | Freq: Every day | ORAL | Status: DC

## 2012-03-15 NOTE — Progress Notes (Signed)
Quick Note:  Spoke with pt- informed of lab and new rx to start - [facility] - change to med list done ______

## 2012-05-31 ENCOUNTER — Other Ambulatory Visit: Payer: Self-pay | Admitting: Internal Medicine

## 2012-06-03 ENCOUNTER — Other Ambulatory Visit: Payer: Self-pay | Admitting: Internal Medicine

## 2012-08-23 ENCOUNTER — Other Ambulatory Visit: Payer: Self-pay | Admitting: Internal Medicine

## 2012-08-23 DIAGNOSIS — Z1231 Encounter for screening mammogram for malignant neoplasm of breast: Secondary | ICD-10-CM

## 2012-10-02 ENCOUNTER — Ambulatory Visit
Admission: RE | Admit: 2012-10-02 | Discharge: 2012-10-02 | Disposition: A | Discharge status: Home / Self Care | Payer: PRIVATE HEALTH INSURANCE | Source: Ambulatory Visit | Attending: Internal Medicine | Admitting: Internal Medicine

## 2012-10-02 DIAGNOSIS — Z1231 Encounter for screening mammogram for malignant neoplasm of breast: Secondary | ICD-10-CM

## 2012-10-15 ENCOUNTER — Other Ambulatory Visit: Payer: Self-pay | Admitting: Internal Medicine

## 2012-10-15 DIAGNOSIS — R928 Other abnormal and inconclusive findings on diagnostic imaging of breast: Secondary | ICD-10-CM

## 2012-10-22 ENCOUNTER — Ambulatory Visit
Admission: RE | Admit: 2012-10-22 | Discharge: 2012-10-22 | Disposition: A | Discharge status: Home / Self Care | Payer: PRIVATE HEALTH INSURANCE | Source: Ambulatory Visit | Attending: Internal Medicine | Admitting: Internal Medicine

## 2012-10-22 DIAGNOSIS — R928 Other abnormal and inconclusive findings on diagnostic imaging of breast: Secondary | ICD-10-CM

## 2013-04-22 ENCOUNTER — Encounter: Payer: Self-pay | Admitting: Internal Medicine

## 2013-05-23 ENCOUNTER — Other Ambulatory Visit: Payer: Self-pay | Admitting: Internal Medicine

## 2013-05-28 ENCOUNTER — Other Ambulatory Visit: Payer: Self-pay | Admitting: Internal Medicine

## 2013-05-29 ENCOUNTER — Other Ambulatory Visit: Payer: Self-pay | Admitting: Internal Medicine

## 2013-07-28 ENCOUNTER — Other Ambulatory Visit: Payer: Self-pay | Admitting: Internal Medicine

## 2013-09-02 ENCOUNTER — Other Ambulatory Visit: Payer: Self-pay

## 2013-09-02 DIAGNOSIS — Z1231 Encounter for screening mammogram for malignant neoplasm of breast: Secondary | ICD-10-CM

## 2013-09-23 ENCOUNTER — Other Ambulatory Visit: Payer: Self-pay | Admitting: Internal Medicine

## 2013-09-26 ENCOUNTER — Other Ambulatory Visit: Payer: Self-pay | Admitting: Internal Medicine

## 2013-09-30 ENCOUNTER — Other Ambulatory Visit: Payer: Self-pay | Admitting: Internal Medicine

## 2013-10-06 ENCOUNTER — Ambulatory Visit
Admission: RE | Admit: 2013-10-06 | Discharge: 2013-10-06 | Disposition: A | Discharge status: Home / Self Care | Payer: Medicare Other | Source: Ambulatory Visit

## 2013-10-06 DIAGNOSIS — Z1231 Encounter for screening mammogram for malignant neoplasm of breast: Secondary | ICD-10-CM

## 2013-10-07 ENCOUNTER — Encounter: Payer: PRIVATE HEALTH INSURANCE | Admitting: Internal Medicine

## 2013-10-30 ENCOUNTER — Encounter: Payer: Self-pay | Admitting: Internal Medicine

## 2013-10-30 ENCOUNTER — Ambulatory Visit (INDEPENDENT_AMBULATORY_CARE_PROVIDER_SITE_OTHER): Payer: Medicare Other | Admitting: Internal Medicine

## 2013-10-30 VITALS — BP 146/90 | HR 88 | Temp 98.5°F | Resp 20 | Ht 63.5 in | Wt 158.0 lb

## 2013-10-30 DIAGNOSIS — Z0000 Encounter for general adult medical examination without abnormal findings: Secondary | ICD-10-CM

## 2013-10-30 DIAGNOSIS — E785 Hyperlipidemia, unspecified: Secondary | ICD-10-CM

## 2013-10-30 DIAGNOSIS — Z720 Tobacco use: Secondary | ICD-10-CM

## 2013-10-30 DIAGNOSIS — F17200 Nicotine dependence, unspecified, uncomplicated: Secondary | ICD-10-CM

## 2013-10-30 DIAGNOSIS — I10 Essential (primary) hypertension: Secondary | ICD-10-CM

## 2013-10-30 DIAGNOSIS — Z23 Encounter for immunization: Secondary | ICD-10-CM

## 2013-10-30 LAB — CBC WITH DIFFERENTIAL/PLATELET
Basophils Absolute: 0 10*3/uL (ref 0.0–0.1)
Basophils Relative: 0.3 % (ref 0.0–3.0)
EOS ABS: 0 10*3/uL (ref 0.0–0.7)
Eosinophils Relative: 0.3 % (ref 0.0–5.0)
HCT: 48.5 % — ABNORMAL HIGH (ref 36.0–46.0)
HEMOGLOBIN: 16.5 g/dL — AB (ref 12.0–15.0)
LYMPHS ABS: 1.5 10*3/uL (ref 0.7–4.0)
LYMPHS PCT: 14 % (ref 12.0–46.0)
MCHC: 33.9 g/dL (ref 30.0–36.0)
MCV: 100.2 fl — AB (ref 78.0–100.0)
MONO ABS: 0.6 10*3/uL (ref 0.1–1.0)
Monocytes Relative: 5.2 % (ref 3.0–12.0)
NEUTROS ABS: 8.8 10*3/uL — AB (ref 1.4–7.7)
Neutrophils Relative %: 80.2 % — ABNORMAL HIGH (ref 43.0–77.0)
Platelets: 156 10*3/uL (ref 150.0–400.0)
RBC: 4.84 Mil/uL (ref 3.87–5.11)
RDW: 13.2 % (ref 11.5–14.6)
WBC: 11 10*3/uL — ABNORMAL HIGH (ref 4.5–10.5)

## 2013-10-30 LAB — LIPID PANEL
CHOL/HDL RATIO: 3
CHOLESTEROL: 197 mg/dL (ref 0–200)
HDL: 68.6 mg/dL (ref >39.00)
LDL CALC: 111 mg/dL — AB (ref 0–99)
TRIGLYCERIDES: 88 mg/dL (ref 0.0–149.0)
VLDL: 17.6 mg/dL (ref 0.0–40.0)

## 2013-10-30 LAB — COMPREHENSIVE METABOLIC PANEL
ALK PHOS: 64 U/L (ref 39–117)
ALT: 37 U/L — AB (ref 0–35)
AST: 50 U/L — AB (ref 0–37)
Albumin: 4.1 g/dL (ref 3.5–5.2)
BUN: 8 mg/dL (ref 6–23)
CO2: 31 mEq/L (ref 19–32)
CREATININE: 0.7 mg/dL (ref 0.4–1.2)
Calcium: 9.6 mg/dL (ref 8.4–10.5)
Chloride: 98 mEq/L (ref 96–112)
GFR: 110.9 mL/min (ref >60.00)
Glucose, Bld: 124 mg/dL — ABNORMAL HIGH (ref 70–99)
Potassium: 3.6 mEq/L (ref 3.5–5.1)
Sodium: 138 mEq/L (ref 135–145)
Total Bilirubin: 1.2 mg/dL (ref 0.3–1.2)
Total Protein: 7.9 g/dL (ref 6.0–8.3)

## 2013-10-30 LAB — TSH: TSH: 1.02 u[IU]/mL (ref 0.35–5.50)

## 2013-10-30 MED ORDER — POTASSIUM CHLORIDE CRYS ER 20 MEQ PO TBCR: EXTENDED_RELEASE_TABLET | ORAL | Status: DC

## 2013-10-30 MED ORDER — HYDROCHLOROTHIAZIDE 25 MG PO TABS: ORAL_TABLET | ORAL | Status: DC

## 2013-10-30 MED ORDER — LISINOPRIL-HYDROCHLOROTHIAZIDE 20-25 MG PO TABS: 1.0000 | ORAL_TABLET | Freq: Every day | ORAL | Status: DC

## 2013-10-30 MED ORDER — TRIAMCINOLONE ACETONIDE 0.025 % EX OINT: 1.0000 "application " | TOPICAL_OINTMENT | Freq: Two times a day (BID) | CUTANEOUS | Status: DC

## 2013-10-30 NOTE — Patient Instructions (Signed)
Limit your sodium (Salt) intake    It is important that you exercise regularly, at least 20 minutes 3 to 4 times per week.  If you develop chest pain or shortness of breath seek  medical attention.  Moderate alcohol use  Smoking tobacco is very bad for your health. You should stop smoking immediately.

## 2013-10-30 NOTE — Progress Notes (Signed)
Patient ID: Tracy Davidson, female   DOB: [birth year], 73 y.o.   MRN: [id number]  Subjective:    Patient ID: Tracy Davidson, female    DOB: [birth year], 73 y.o.   MRN: [id number]  Hypertension Pertinent negatives include no chest pain, headaches, palpitations or shortness of breath.   73 -year-old patient seen today for an annual exam;  medical problems include hypertension well controlled on diuretic therapy;  she has a history of colonic polyps and underwent a colonoscopy in July of 2011. She's had a total abdominal hysterectomy in 2002. She has a history mild LFT  related to EtOH. No concerns or complaints today     Preventive Screening-Counseling & Management  Alcohol-Tobacco  Smoking Cessation Counseling: yes  Alcohol  "too much"  Allergies:  1) ! Doxycycline   Past History:   Bipolar  High Cholesterol  Colonic polyps, hx of  Hypertension  Allergic rhinitis   Past Surgical History:  TAH  Breast Biopsy  D&C  T/A 1963  Hysterectomy 2002 colonoscopy 2005, July 2011   Family History:   Family History of Bowel disease  Family History of CAD Female 1st degree relative <50  Family History Diabetes 1st degree relative  Family History of Cardiovascular disorder  Father-died patient age 1 ?intestinal blockage  Mother died age 74 MI, PVOD  One brother-died DM, PVOD, CAD  Four sisters- 3 deceased: MI   Social History:   Occupation: Manufacturing systems engineer  Single  Current Smoker  Alcohol use-yes  1. Risk factors, based on past  M,S,F history-  cardiovascular risk factors include hypertension and family history of coronary artery disease;   in addition her risk factors include tobacco use  2.  Physical activities:  No exercise limitations but fairly sedentary   3. Hearing: No deficits  4.  Depression-  history depression in the past but no recent treatment 5.  ADL's:  Independent in all aspects of daily living   6.  Fall risk:  Low  7.  Home safety:  No problems  identified    8.  Height weight, and visual acuity;  height and weight stable no change in visual acuity   9.  Counseling:  More regular exercise heart healthy diet calcium and vitamin D encouraged    10. Lab orders based on risk factors:  Laboratory profile including lipid panel will be reviewed       11. Referral:  Not appropriate at this time      12. Care plan:  Heart healthy diet encouraged     13. Cognitive assessment:  Alert and oriented with normal affect no cognitive dysfunction       Review of Systems  Constitutional: Negative for fever, appetite change, fatigue and unexpected weight change.  HENT: Negative for congestion, dental problem, ear pain, hearing loss, mouth sores, nosebleeds, sinus pressure, sore throat, tinnitus, trouble swallowing and voice change.   Eyes: Negative for photophobia, pain, redness and visual disturbance.  Respiratory: Negative for cough, chest tightness and shortness of breath.   Cardiovascular: Negative for chest pain, palpitations and leg swelling.  Gastrointestinal: Negative for nausea, vomiting, abdominal pain, diarrhea, constipation, blood in stool, abdominal distention and rectal pain.  Genitourinary: Negative for dysuria, urgency, frequency, hematuria, flank pain, vaginal bleeding, vaginal discharge, difficulty urinating, genital sores, vaginal pain, menstrual problem and pelvic pain.  Musculoskeletal: Negative for arthralgias, back pain and neck stiffness.  Skin: Negative for rash.  Neurological: Negative for dizziness, syncope, speech difficulty, weakness, light-headedness, numbness and headaches.  Hematological: Negative for adenopathy. Does not bruise/bleed easily.  Psychiatric/Behavioral: Negative for suicidal ideas, behavioral problems, self-injury, dysphoric mood and agitation. The patient is not nervous/anxious.        Objective:   Physical Exam  Constitutional: She is oriented to person, place, and time. She appears well-developed  and well-nourished.  Blood pressure 160/84  HENT:  Head: Normocephalic and atraumatic.  Right Ear: External ear normal.  Left Ear: External ear normal.  Mouth/Throat: Oropharynx is clear and moist.  Eyes: Conjunctivae and EOM are normal.  Neck: Normal range of motion. Neck supple. No JVD present. No thyromegaly present.  Cardiovascular: Normal rate, regular rhythm, normal heart sounds and intact distal pulses.   No murmur heard. Dorsalis pedis pulses full posterior tibial pulses not easily palpable  Pulmonary/Chest: Effort normal and breath sounds normal. She has no wheezes. She has no rales.  Abdominal: Soft. Bowel sounds are normal. She exhibits no distension and no mass. There is no tenderness. There is no rebound and no guarding.  Musculoskeletal: Normal range of motion. She exhibits no edema and no tenderness.  Mild swelling left knee  Neurological: She is alert and oriented to person, place, and time. She has normal reflexes. No cranial nerve deficit. She exhibits normal muscle tone. Coordination normal.  Skin: Skin is warm and dry. No rash noted.  Patchy dermatitis over the dorsal aspect of the right hand with some thickening and hyperpigmentation as well as scattered areas of alopecia  Psychiatric: She has a normal mood and affect. Her behavior is normal.          Assessment & Plan:   Annual health assessment  Hypertension suboptimal control. We'll switch to a combination of lisinopril hydrochlorothiazide- low salt diet home blood pressure monitoring and regular exercise all encouraged  Tobacco abuse. Total smoking cessation encouraged  We'll continue present regimen heart healthy diet encouraged calcium and vitamin D encouraged as well as more regular exercise. We'll recheck in 6 months  History of elevated LFTs related to alcohol use. moderate alcohol use

## 2013-10-30 NOTE — Progress Notes (Signed)
Pre-visit discussion using our clinic review tool. No additional management support is needed unless otherwise documented below in the visit note.  

## 2013-11-13 ENCOUNTER — Encounter: Payer: Self-pay | Admitting: Internal Medicine

## 2013-11-16 ENCOUNTER — Telehealth: Payer: Self-pay | Admitting: Internal Medicine

## 2013-11-16 NOTE — Telephone Encounter (Signed)
Relevant patient education mailed to patient.  

## 2013-11-19 ENCOUNTER — Telehealth: Payer: Self-pay | Admitting: Internal Medicine

## 2013-11-19 NOTE — Telephone Encounter (Signed)
Relevant patient education mailed to patient.  

## 2014-06-11 ENCOUNTER — Encounter: Payer: Self-pay | Admitting: Physician Assistant

## 2014-06-11 ENCOUNTER — Telehealth: Payer: Self-pay | Admitting: Internal Medicine

## 2014-06-11 ENCOUNTER — Ambulatory Visit (INDEPENDENT_AMBULATORY_CARE_PROVIDER_SITE_OTHER): Payer: Medicare Other | Admitting: Physician Assistant

## 2014-06-11 VITALS — BP 138/78 | HR 84 | Temp 99.1°F | Resp 18 | Wt 149.6 lb

## 2014-06-11 DIAGNOSIS — N390 Urinary tract infection, site not specified: Secondary | ICD-10-CM

## 2014-06-11 DIAGNOSIS — R319 Hematuria, unspecified: Secondary | ICD-10-CM

## 2014-06-11 LAB — POCT URINALYSIS DIPSTICK
Nitrite, UA: POSITIVE
PH UA: 8.5
SPEC GRAV UA: 1.015
Urobilinogen, UA: >=8

## 2014-06-11 MED ORDER — CIPROFLOXACIN HCL 500 MG PO TABS: 500.0000 mg | ORAL_TABLET | Freq: Two times a day (BID) | ORAL | Status: DC

## 2014-06-11 NOTE — Telephone Encounter (Signed)
Patient Information:  Caller Name: Carmesha  Phone: [phone]  Patient: Tracy Davidson, Tracy Davidson  Gender: Female  DOB: [birth year]  Age: 73 Years  PCP: Bluford Kaufmann (Family Practice > 16yrs old)  Office Follow Up:  Does the office need to follow up with this patient?: No  Instructions For The Office: N/A   Symptoms  Reason For Call & Symptoms: Patient reports blood in urine onset 06/10/14.  Grossly bloody urine reported; cannot see the bottom of toilet through the urine.  Go to Office Now per nursing judgment and No Protocol Available - Sick Adult guideline.  Reviewed Health History In EMR: Yes  Reviewed Medications In EMR: Yes  Reviewed Allergies In EMR: Yes  Reviewed Surgeries / Procedures: Yes  Date of Onset of Symptoms: 06/10/2014  Treatments Tried: increased liquids  Treatments Tried Worked: No  Guideline(s) Used:  No Protocol Available - Sick Adult  Disposition Per Guideline:   Go to Office Now  Reason For Disposition Reached:   Nursing judgment  Advice Given:  Call Back If:  New symptoms develop  You become worse.  Patient Will Follow Care Advice:  YES  Appointment Scheduled:  06/11/2014 13:00:00 Appointment Scheduled Provider:  Kela Millin (only sees ages 31 and up)

## 2014-06-11 NOTE — Patient Instructions (Addendum)
Cipro twice daily for 7 days for UTI.  Push fluid hydration with water.  We will call you with the results of the urine test when they're available.  If emergency symptoms discussed during visit developed, seek medical attention immediately.  Followup in 2-3 weeks for repeat urine testing, or for worsening or persistent symptoms despite treatment.    Hematuria Hematuria is blood in your urine. It can be caused by a bladder infection, kidney infection, prostate infection, kidney stone, or cancer of your urinary tract. Infections can usually be treated with medicine, and a kidney stone usually will pass through your urine. If neither of these is the cause of your hematuria, further workup to find out the reason may be needed. It is very important that you tell your health care provider about any blood you see in your urine, even if the blood stops without treatment or happens without causing pain. Blood in your urine that happens and then stops and then happens again can be a symptom of a very serious condition. Also, pain is not a symptom in the initial stages of many urinary cancers. HOME CARE INSTRUCTIONS   Drink lots of fluid, 3-4 quarts a day. If you have been diagnosed with an infection, cranberry juice is especially recommended, in addition to large amounts of water.  Avoid caffeine, tea, and carbonated beverages because they tend to irritate the bladder.  Avoid alcohol because it may irritate the prostate.  Take all medicines as directed by your health care provider.  If you were prescribed an antibiotic medicine, finish it all even if you start to feel better.  If you have been diagnosed with a kidney stone, follow your health care provider's instructions regarding straining your urine to catch the stone.  Empty your bladder often. Avoid holding urine for long periods of time.  After a bowel movement, women should cleanse front to back. Use each tissue only once.  Empty your  bladder before and after sexual intercourse if you are a female. SEEK MEDICAL CARE IF:  You develop back pain.  You have a fever.  You have a feeling of sickness in your stomach (nausea) or vomiting.  Your symptoms are not better in 3 days. Return sooner if you are getting worse. SEEK IMMEDIATE MEDICAL CARE IF:   You develop severe vomiting and are unable to keep the medicine down.  You develop severe back or abdominal pain despite taking your medicines.  You begin passing a large amount of blood or clots in your urine.  You feel extremely weak or faint, or you pass out. MAKE SURE YOU:   Understand these instructions.  Will watch your condition.  Will get help right away if you are not doing well or get worse. Document Released: 10/02/2005 Document Revised: 02/16/2014 Document Reviewed: 06/02/2013 [facility] Patient Information 2015 [place], [place]. This information is not intended to replace advice given to you by your health care provider. Make sure you discuss any questions you have with your health care provider.

## 2014-06-11 NOTE — Progress Notes (Signed)
Subjective:    Patient ID: Tracy Davidson, female    DOB: [birth year], 73 y.o.   MRN: [id number]  Hematuria This is a new problem. The current episode started yesterday (also had 1 episode of this 2 weeks ago, but resolved spontaneously, and was not seen for this.). The problem is unchanged. She describes the hematuria as gross hematuria. The hematuria occurs throughout her entire urinary stream. She reports no clotting in her urine stream. Her pain is at a severity of 0/10. She is experiencing no pain. She describes her urine color as dark red. Irritative symptoms do not include frequency, nocturia or urgency. Obstructive symptoms do not include dribbling, incomplete emptying, an intermittent stream, a slower stream, straining or a weak stream. Pertinent negatives include no abdominal pain, chills, dysuria, facial swelling, fever, flank pain, genital pain, hesitancy, inability to urinate, nausea or vomiting. She is not sexually active. Her past medical history is significant for hypertension and tobacco use. There is no history of GU trauma, kidney stones, recent infection, sickle cell disease or STDs. (Patient also denies ever having a UTI before, to her knowledge)      Review of Systems  Constitutional: Negative for fever and chills.  HENT: Negative for facial swelling.   Respiratory: Negative for cough.   Cardiovascular: Negative for chest pain.  Gastrointestinal: Negative for nausea, vomiting, abdominal pain and diarrhea.  Genitourinary: Positive for hematuria. Negative for dysuria, hesitancy, urgency, frequency, flank pain, incomplete emptying and nocturia.  Neurological: Negative for syncope and headaches.  All other systems reviewed and are negative.  Past Medical History  Diagnosis Date  . ALLERGIC RHINITIS 09/20/2009  . COLONIC POLYPS, HX OF 06/06/2007  . DYSLIPIDEMIA 09/20/2009  . HYPERTENSION 06/06/2007  . LIVER FUNCTION TESTS, ABNORMAL, HX OF 09/21/2009  . Bipolar affective  disorder     History   Social History  . Marital Status: Single    Spouse Name: N/A    Number of Children: N/A  . Years of Education: N/A   Occupational History  . Not on file.   Social History Main Topics  . Smoking status: Current Every Day Smoker -- 1.00 packs/day    Types: Cigarettes  . Smokeless tobacco: Never Used  . Alcohol Use: Yes  . Drug Use: No  . Sexual Activity: Not on file   Other Topics Concern  . Not on file   Social History Narrative  . No narrative on file    Past Surgical History  Procedure Laterality Date  . Total abdominal hysterectomy    . Dilation and curettage of uterus    . Tonsillectomy    . Breast surgery      breast bx    No family history on file.  Allergies  Allergen Reactions  . Doxycycline     REACTION: tachycardia    Current Outpatient Prescriptions on File Prior to Visit  Medication Sig Dispense Refill  . lisinopril-hydrochlorothiazide (PRINZIDE,ZESTORETIC) 20-25 MG per tablet Take 1 tablet by mouth daily.  90 tablet  3  . potassium chloride SA (K-DUR,KLOR-CON) 20 MEQ tablet TAKE 1 TABLET (20 MEQ TOTAL) BY MOUTH DAILY.  90 tablet  1  . triamcinolone (KENALOG) 0.025 % ointment Apply 1 application topically 2 (two) times daily.  30 g  3   No current facility-administered medications on file prior to visit.    EXAM: BP 138/78  Pulse 84  Temp(Src) 99.1 F (37.3 C) (Oral)  Resp 18  Wt 149 lb 9.6 oz (67.858 kg)  Objective:   Physical Exam  Nursing note and vitals reviewed. Constitutional: She is oriented to person, place, and time. She appears well-developed and well-nourished. No distress.  HENT:  Head: Normocephalic and atraumatic.  Eyes: Conjunctivae and EOM are normal. Pupils are equal, round, and reactive to light.  Cardiovascular: Normal rate, regular rhythm and intact distal pulses.   Pulmonary/Chest: Effort normal and breath sounds normal. No respiratory distress. She has no wheezes. She has no rales. She  exhibits no tenderness.  No CVA tenderness  Abdominal: Soft. There is no tenderness.  Neurological: She is alert and oriented to person, place, and time.  Skin: Skin is warm and dry. She is not diaphoretic.  Psychiatric: She has a normal mood and affect. Her behavior is normal. Judgment and thought content normal.     Lab Results  Component Value Date   WBC 11.0* 10/30/2013   HGB 16.5* 10/30/2013   HCT 48.5* 10/30/2013   PLT 156.0 10/30/2013   GLUCOSE 124* 10/30/2013   CHOL 197 10/30/2013   TRIG 88.0 10/30/2013   HDL 68.60 10/30/2013   LDLDIRECT 143.2 03/14/2011   LDLCALC 111* 10/30/2013   ALT 37* 10/30/2013   AST 50* 10/30/2013   NA 138 10/30/2013   K 3.6 10/30/2013   CL 98 10/30/2013   CREATININE 0.7 10/30/2013   BUN 8 10/30/2013   CO2 31 10/30/2013   TSH 1.02 10/30/2013    Urinalysis Component     Latest Ref Rng 06/11/2014  Color, UA      red  Clarity, UA      cloudy  Glucose      trace  Bilirubin, UA      3+  Ketones, UA      2+  Specific Gravity, UA      1.015  RBC, UA      3+  pH, UA      8.5  Protein, UA      3+  Urobilinogen, UA      >=8.0  Nitrite, UA      pos  Leukocytes, UA      large (3+)        Assessment & Plan:  Tracy Davidson was seen today for hematuria.  Diagnoses and associated orders for this visit:  Hematuria Comments: UA with 3+ blood, nit and Leuks. Culture. - POCT urinalysis dipstick; Standing - Culture, Urine - POCT urinalysis dipstick  Urinary tract infection, site not specified Comments: Due to Gross hematuria, will tx with cipro 7days. Follow up UA in 2- 3 weeks. - ciprofloxacin (CIPRO) 500 MG tablet; Take 1 tablet (500 mg total) by mouth 2 (two) times daily. - POC Urinalysis Dipstick; Future    Spoke with Dr. Burnice Logan regarding the patient, and I agree with him that we should start with aggressive treatment of UTI, with close followup of urine testing in 2-3 weeks. Patient is amenable to this.  Return precautions provided, and  patient handout on hematuria.  Plan to follow up in 2-3 weeks for repeat urine testing, or for worsening or persistent symptoms despite treatment.  Patient Instructions  Cipro twice daily for 7 days for UTI.  Push fluid hydration with water.  We will call you with the results of the urine test when they're available.  If emergency symptoms discussed during visit developed, seek medical attention immediately.  Followup in 2-3 weeks for repeat urine testing, or for worsening or persistent symptoms despite treatment.

## 2014-06-11 NOTE — Telephone Encounter (Signed)
Noted  

## 2014-06-11 NOTE — Progress Notes (Signed)
Pre visit review using our clinic review tool, if applicable. No additional management support is needed unless otherwise documented below in the visit note. 

## 2014-06-13 LAB — URINE CULTURE
Colony Count: NO GROWTH
Organism ID, Bacteria: NO GROWTH

## 2014-06-29 ENCOUNTER — Other Ambulatory Visit (INDEPENDENT_AMBULATORY_CARE_PROVIDER_SITE_OTHER): Payer: Medicare Other

## 2014-06-29 DIAGNOSIS — N390 Urinary tract infection, site not specified: Secondary | ICD-10-CM

## 2014-06-29 LAB — POCT URINALYSIS DIPSTICK
BILIRUBIN UA: NEGATIVE
Glucose, UA: NEGATIVE
Ketones, UA: NEGATIVE
LEUKOCYTES UA: NEGATIVE
NITRITE UA: NEGATIVE
Protein, UA: NEGATIVE
Spec Grav, UA: 1.01
Urobilinogen, UA: 1
pH, UA: 7

## 2014-06-29 LAB — URINALYSIS, MICROSCOPIC ONLY

## 2014-06-30 ENCOUNTER — Other Ambulatory Visit: Payer: Self-pay | Admitting: Physician Assistant

## 2014-06-30 DIAGNOSIS — R319 Hematuria, unspecified: Secondary | ICD-10-CM

## 2014-08-04 ENCOUNTER — Other Ambulatory Visit: Payer: Self-pay | Admitting: Internal Medicine

## 2014-09-03 ENCOUNTER — Other Ambulatory Visit: Payer: Self-pay

## 2014-09-03 DIAGNOSIS — Z1231 Encounter for screening mammogram for malignant neoplasm of breast: Secondary | ICD-10-CM

## 2014-10-13 ENCOUNTER — Ambulatory Visit
Admission: RE | Admit: 2014-10-13 | Discharge: 2014-10-13 | Disposition: A | Discharge status: Home / Self Care | Payer: Medicare Other | Source: Ambulatory Visit

## 2014-10-13 DIAGNOSIS — Z1231 Encounter for screening mammogram for malignant neoplasm of breast: Secondary | ICD-10-CM

## 2014-10-16 ENCOUNTER — Other Ambulatory Visit: Payer: Self-pay | Admitting: Internal Medicine

## 2015-01-18 ENCOUNTER — Other Ambulatory Visit: Payer: Self-pay | Admitting: Internal Medicine

## 2015-02-15 ENCOUNTER — Other Ambulatory Visit: Payer: Self-pay | Admitting: Internal Medicine

## 2015-02-19 ENCOUNTER — Other Ambulatory Visit: Payer: Self-pay | Admitting: Internal Medicine

## 2015-03-05 ENCOUNTER — Other Ambulatory Visit: Payer: Self-pay | Admitting: Internal Medicine

## 2015-03-22 ENCOUNTER — Other Ambulatory Visit: Payer: Self-pay | Admitting: Internal Medicine

## 2015-03-23 ENCOUNTER — Other Ambulatory Visit: Payer: Self-pay | Admitting: Internal Medicine

## 2015-04-07 ENCOUNTER — Other Ambulatory Visit: Payer: Self-pay | Admitting: Internal Medicine

## 2015-04-19 ENCOUNTER — Other Ambulatory Visit: Payer: Self-pay | Admitting: Internal Medicine

## 2015-05-15 ENCOUNTER — Other Ambulatory Visit: Payer: Self-pay | Admitting: Internal Medicine

## 2015-06-15 ENCOUNTER — Other Ambulatory Visit: Payer: Self-pay | Admitting: Internal Medicine

## 2015-07-12 ENCOUNTER — Other Ambulatory Visit: Payer: Self-pay | Admitting: Internal Medicine

## 2015-09-06 ENCOUNTER — Other Ambulatory Visit: Payer: Self-pay

## 2015-09-06 DIAGNOSIS — Z1231 Encounter for screening mammogram for malignant neoplasm of breast: Secondary | ICD-10-CM

## 2015-09-13 ENCOUNTER — Other Ambulatory Visit: Payer: Self-pay | Admitting: Internal Medicine

## 2015-10-04 ENCOUNTER — Other Ambulatory Visit: Payer: Self-pay | Admitting: Internal Medicine

## 2015-10-15 ENCOUNTER — Ambulatory Visit
Admission: RE | Admit: 2015-10-15 | Discharge: 2015-10-15 | Disposition: A | Discharge status: Home / Self Care | Payer: Medicare Other | Source: Ambulatory Visit

## 2015-10-15 DIAGNOSIS — Z1231 Encounter for screening mammogram for malignant neoplasm of breast: Secondary | ICD-10-CM

## 2015-12-08 ENCOUNTER — Ambulatory Visit (INDEPENDENT_AMBULATORY_CARE_PROVIDER_SITE_OTHER): Payer: Medicare Other | Admitting: Internal Medicine

## 2015-12-08 ENCOUNTER — Encounter: Payer: Self-pay | Admitting: Internal Medicine

## 2015-12-08 VITALS — BP 138/80 | HR 83 | Temp 98.5°F | Resp 18 | Ht 63.25 in | Wt 146.0 lb

## 2015-12-08 DIAGNOSIS — Z86010 Personal history of colonic polyps: Secondary | ICD-10-CM | POA: Diagnosis not present

## 2015-12-08 DIAGNOSIS — Z720 Tobacco use: Secondary | ICD-10-CM

## 2015-12-08 DIAGNOSIS — I10 Essential (primary) hypertension: Secondary | ICD-10-CM | POA: Diagnosis not present

## 2015-12-08 DIAGNOSIS — Z0000 Encounter for general adult medical examination without abnormal findings: Secondary | ICD-10-CM

## 2015-12-08 DIAGNOSIS — E785 Hyperlipidemia, unspecified: Secondary | ICD-10-CM | POA: Diagnosis not present

## 2015-12-08 LAB — POCT URINALYSIS DIPSTICK
GLUCOSE UA: NEGATIVE
LEUKOCYTES UA: NEGATIVE
NITRITE UA: NEGATIVE
Spec Grav, UA: 1.015
UROBILINOGEN UA: 1
pH, UA: 6.5

## 2015-12-08 LAB — CBC WITH DIFFERENTIAL/PLATELET
BASOS ABS: 0 10*3/uL (ref 0.0–0.1)
BASOS PCT: 0.3 % (ref 0.0–3.0)
Eosinophils Absolute: 0.1 10*3/uL (ref 0.0–0.7)
Eosinophils Relative: 1 % (ref 0.0–5.0)
HEMATOCRIT: 46.8 % — AB (ref 36.0–46.0)
Hemoglobin: 15.9 g/dL — ABNORMAL HIGH (ref 12.0–15.0)
LYMPHS PCT: 16.8 % (ref 12.0–46.0)
Lymphs Abs: 1.7 10*3/uL (ref 0.7–4.0)
MCHC: 34 g/dL (ref 30.0–36.0)
MCV: 102 fl — ABNORMAL HIGH (ref 78.0–100.0)
Monocytes Absolute: 0.8 10*3/uL (ref 0.1–1.0)
Monocytes Relative: 7.7 % (ref 3.0–12.0)
NEUTROS ABS: 7.5 10*3/uL (ref 1.4–7.7)
Neutrophils Relative %: 74.2 % (ref 43.0–77.0)
Platelets: 161 10*3/uL (ref 150.0–400.0)
RBC: 4.58 Mil/uL (ref 3.87–5.11)
RDW: 13.4 % (ref 11.5–15.5)
WBC: 10.1 10*3/uL (ref 4.0–10.5)

## 2015-12-08 LAB — COMPREHENSIVE METABOLIC PANEL
ALT: 28 U/L (ref 0–35)
AST: 43 U/L — AB (ref 0–37)
Albumin: 4.4 g/dL (ref 3.5–5.2)
Alkaline Phosphatase: 66 U/L (ref 39–117)
BUN: 11 mg/dL (ref 6–23)
CALCIUM: 10.1 mg/dL (ref 8.4–10.5)
CHLORIDE: 99 meq/L (ref 96–112)
CO2: 32 meq/L (ref 19–32)
CREATININE: 0.66 mg/dL (ref 0.40–1.20)
GFR: 112.19 mL/min (ref >60.00)
GLUCOSE: 114 mg/dL — AB (ref 70–99)
Potassium: 3.7 mEq/L (ref 3.5–5.1)
SODIUM: 140 meq/L (ref 135–145)
Total Bilirubin: 1 mg/dL (ref 0.2–1.2)
Total Protein: 8.1 g/dL (ref 6.0–8.3)

## 2015-12-08 LAB — LIPID PANEL
CHOL/HDL RATIO: 3
Cholesterol: 190 mg/dL (ref 0–200)
HDL: 66.8 mg/dL (ref >39.00)
LDL Cholesterol: 104 mg/dL — ABNORMAL HIGH (ref 0–99)
NonHDL: 122.87
Triglycerides: 95 mg/dL (ref 0.0–149.0)
VLDL: 19 mg/dL (ref 0.0–40.0)

## 2015-12-08 LAB — TSH: TSH: 2.25 u[IU]/mL (ref 0.35–4.50)

## 2015-12-08 NOTE — Patient Instructions (Signed)
Limit your sodium (Salt) intake  Please check your blood pressure on a regular basis.  If it is consistently greater than 150/90, please make an office appointment.    It is important that you exercise regularly, at least 20 minutes 3 to 4 times per week.  If you develop chest pain or shortness of breath seek  medical attention.  Moderate your alcohol intake  Consider final screening colonoscopy  Return in 6 months for follow-up

## 2015-12-08 NOTE — Progress Notes (Signed)
Subjective:    Patient ID: Tracy Davidson, female    DOB: [birth year], 75 y.o.   MRN: [id number]  HPI  Patient ID: Tracy Davidson, female   DOB: 04-02-1941, 75 y.o.   MRN: [id number]  Subjective:    Patient ID: Tracy Davidson, female    DOB: 1941/05/26, 75 y.o.   MRN: [id number]  Hypertension Pertinent negatives include no chest pain, headaches, palpitations or shortness of breath.   75  -year-old patient seen today for an annual exam;    Medical problems include hypertension well controlled on diuretic therapy;  she has a history of colonic polyps and underwent a colonoscopy in July of 2011. She's had a total abdominal hysterectomy in 2002. She has a history mild LFT  related to EtOH. No concerns or complaints today.  She does complain of some mild stiffness of both knees  Mammogram December 2016     Preventive Screening-Counseling & Management  Alcohol-Tobacco  Smoking Cessation Counseling: yes  Alcohol  "too much"  Allergies:  1) ! Doxycycline   Past History:   Bipolar  High Cholesterol  Colonic polyps, hx of  Hypertension  Allergic rhinitis  History of daily alcohol use/increased LFTs  Past Surgical History:  TAH  Breast Biopsy  D&C  T/A 1963  Hysterectomy 2002 colonoscopy 2005, July 2011   Family History:   Family History of Bowel disease  Family History of CAD Female 1st degree relative <50  Family History Diabetes 1st degree relative  Family History of Cardiovascular disorder  Father-died patient age 1 ?intestinal blockage  Mother died age 85 MI, PVOD  One brother-died DM, PVOD, CAD  Four sisters- 3 deceased: MI   Social History:   Occupation: Manufacturing systems engineer  Single  Current Smoker  Alcohol use-yes  1. Risk factors, based on past  M,S,F history-  cardiovascular risk factors include hypertension and family history of coronary artery disease;   in addition her risk factors include tobacco use  2.  Physical activities:  No exercise  limitations but fairly sedentary; bilateral knee pain  3. Hearing: No deficits  4.  Depression-  history depression in the past but no recent treatment 5.  ADL's:  Independent in all aspects of daily living   6.  Fall risk:  Low  7.  Home safety:  No problems identified    8.  Height weight, and visual acuity;  height and weight stable no change in visual acuity   9.  Counseling:  More regular exercise heart healthy diet calcium and vitamin D encouraged    10. Lab orders based on risk factors:  Laboratory profile including lipid panel will be reviewed       11. Referral:  Not appropriate at this time; patient will consider final colonoscopy     12. Care plan:  Heart healthy diet encouraged     13. Cognitive assessment:  Alert and oriented with normal affect no cognitive dysfunction    14.  Preventive services will include annual clinical examinations with screening lab.  Annual eye examinations recommended.  Patient will consider final colonoscopy 15.  Provider list includes ophthalmology primary care and GI     Review of Systems  Constitutional: Negative for fever, appetite change, fatigue and unexpected weight change.  HENT: Negative for congestion, dental problem, ear pain, hearing loss, mouth sores, nosebleeds, sinus pressure, sore throat, tinnitus, trouble swallowing and voice change.   Eyes: Negative for photophobia, pain, redness and visual disturbance.  Respiratory: Negative for  cough, chest tightness and shortness of breath.   Cardiovascular: Negative for chest pain, palpitations and leg swelling.  Gastrointestinal: Negative for nausea, vomiting, abdominal pain, diarrhea, constipation, blood in stool, abdominal distention and rectal pain.  Genitourinary: Negative for dysuria, urgency, frequency, hematuria, flank pain, vaginal bleeding, vaginal discharge, difficulty urinating, genital sores, vaginal pain, menstrual problem and pelvic pain.  Musculoskeletal: Negative for  arthralgias, back pain and neck stiffness.  Skin: Negative for rash.  Neurological: Negative for dizziness, syncope, speech difficulty, weakness, light-headedness, numbness and headaches.  Hematological: Negative for adenopathy. Does not bruise/bleed easily.  Psychiatric/Behavioral: Negative for suicidal ideas, behavioral problems, self-injury, dysphoric mood and agitation. The patient is not nervous/anxious.        Objective:   Physical Exam  Constitutional: She is oriented to person, place, and time. She appears well-developed and well-nourished.  Blood pressure  130/82 HENT:  Head: Normocephalic and atraumatic.  Right Ear: External ear normal.  Left Ear: External ear normal.  Mouth/Throat: Oropharynx is clear and moist.  Eyes: Conjunctivae and EOM are normal.  Neck: Normal range of motion. Neck supple. No JVD present. No thyromegaly present.  Cardiovascular: Normal rate, regular rhythm, normal heart sounds and intact distal pulses.   No murmur heard. Dorsalis pedis pulses full posterior tibial pulses not easily palpable  Pulmonary/Chest: Effort normal and breath sounds normal. She has no wheezes. She has no rales.  Abdominal: Soft. Bowel sounds are normal. She exhibits no distension and no mass. There is no tenderness. There is no rebound and no guarding.  Musculoskeletal: Normal range of motion. She exhibits no edema and no tenderness.  Mild swelling left knee  Neurological: She is alert and oriented to person, place, and time. She has normal reflexes. No cranial nerve deficit. She exhibits normal muscle tone. Coordination normal.  Skin: Skin is warm and dry. No rash noted.   Psychiatric: She has a normal mood and affect. Her behavior is normal.          Assessment & Plan:   Annual health assessment      Review of Systems  Musculoskeletal:       Bilateral knee pain       Objective:   Physical Exam  Musculoskeletal:  Mild hypertrophic changes left knee.  No  signs of active inflammation          Assessment & Plan:   Preventive health exam Knee osteoarthritis Essential hypertension, well-controlled Dyslipidemia.  Will check a lipid profile History of alcohol use and elevated LFTs.  Will review History colonic polyps.  Consider follow colonoscopy

## 2015-12-08 NOTE — Progress Notes (Signed)
Pre visit review using our clinic review tool, if applicable. No additional management support is needed unless otherwise documented below in the visit note. 

## 2016-03-06 ENCOUNTER — Other Ambulatory Visit: Payer: Self-pay | Admitting: Internal Medicine

## 2016-03-31 ENCOUNTER — Other Ambulatory Visit: Payer: Self-pay | Admitting: Internal Medicine

## 2016-06-05 ENCOUNTER — Other Ambulatory Visit: Payer: Self-pay | Admitting: Internal Medicine

## 2016-06-05 NOTE — Telephone Encounter (Signed)
Rx refill sent to pharmacy. 

## 2016-09-01 ENCOUNTER — Other Ambulatory Visit: Payer: Self-pay | Admitting: Internal Medicine

## 2016-09-04 ENCOUNTER — Other Ambulatory Visit: Payer: Self-pay | Admitting: Internal Medicine

## 2016-09-05 ENCOUNTER — Other Ambulatory Visit: Payer: Self-pay | Admitting: Internal Medicine

## 2016-09-05 DIAGNOSIS — Z1231 Encounter for screening mammogram for malignant neoplasm of breast: Secondary | ICD-10-CM

## 2016-10-17 ENCOUNTER — Ambulatory Visit
Admission: RE | Admit: 2016-10-17 | Discharge: 2016-10-17 | Disposition: A | Discharge status: Home / Self Care | Payer: Medicare Other | Source: Ambulatory Visit | Attending: Internal Medicine | Admitting: Internal Medicine

## 2016-10-17 DIAGNOSIS — Z1231 Encounter for screening mammogram for malignant neoplasm of breast: Secondary | ICD-10-CM

## 2016-10-30 ENCOUNTER — Other Ambulatory Visit: Payer: Self-pay | Admitting: Internal Medicine

## 2017-01-01 ENCOUNTER — Other Ambulatory Visit: Payer: Self-pay | Admitting: Internal Medicine

## 2017-02-26 ENCOUNTER — Other Ambulatory Visit: Payer: Self-pay | Admitting: Internal Medicine

## 2017-02-26 NOTE — Telephone Encounter (Signed)
Patient is due for physical before next refill

## 2017-03-02 ENCOUNTER — Other Ambulatory Visit: Payer: Self-pay | Admitting: Internal Medicine

## 2017-04-13 ENCOUNTER — Other Ambulatory Visit: Payer: Self-pay | Admitting: Internal Medicine

## 2017-05-13 ENCOUNTER — Other Ambulatory Visit: Payer: Self-pay | Admitting: Internal Medicine

## 2017-06-12 ENCOUNTER — Other Ambulatory Visit: Payer: Self-pay | Admitting: Internal Medicine

## 2017-06-14 ENCOUNTER — Other Ambulatory Visit: Payer: Self-pay | Admitting: Internal Medicine

## 2017-07-11 ENCOUNTER — Other Ambulatory Visit: Payer: Self-pay | Admitting: Internal Medicine

## 2017-08-13 ENCOUNTER — Other Ambulatory Visit: Payer: Self-pay | Admitting: Internal Medicine

## 2017-08-24 ENCOUNTER — Other Ambulatory Visit: Payer: Self-pay | Admitting: Internal Medicine

## 2017-09-10 ENCOUNTER — Other Ambulatory Visit: Payer: Self-pay | Admitting: Internal Medicine

## 2017-09-10 DIAGNOSIS — Z1231 Encounter for screening mammogram for malignant neoplasm of breast: Secondary | ICD-10-CM

## 2017-09-11 ENCOUNTER — Telehealth: Payer: Self-pay | Admitting: Family Medicine

## 2017-09-11 NOTE — Telephone Encounter (Signed)
Copied from [place]. Topic: Quick Communication - See Telephone Encounter >> Sep 11, 2017  3:25 PM Percell Belt A wrote: CRM for notification. See Telephone encounter for: pt called in and about her refill on her lisinopril-hydrochlorothiazide (PRINZIDE,ZESTORETIC) 20-25 MG tablet [[id number]] .  I made a cpe appt for her in [place] and she would like to know if she can get a 30 day supply until she can come in in Jan?    09/11/17.

## 2017-09-12 ENCOUNTER — Other Ambulatory Visit: Payer: Self-pay | Admitting: Internal Medicine

## 2017-09-12 MED ORDER — LISINOPRIL-HYDROCHLOROTHIAZIDE 20-25 MG PO TABS
1.0000 | ORAL_TABLET | Freq: Every day | ORAL | 0 refills | Status: DC
Start: 2017-09-12 — End: 2017-10-13

## 2017-09-12 NOTE — Telephone Encounter (Signed)
PRINZIDE,ZESTORETIC) 20-25 MG #30 was e-scribed to pharmacy.

## 2017-10-13 ENCOUNTER — Other Ambulatory Visit: Payer: Self-pay | Admitting: Internal Medicine

## 2017-10-18 ENCOUNTER — Ambulatory Visit
Admission: RE | Admit: 2017-10-18 | Discharge: 2017-10-18 | Disposition: A | Discharge status: Home / Self Care | Payer: Medicare Other | Source: Ambulatory Visit | Attending: Internal Medicine | Admitting: Internal Medicine

## 2017-10-18 DIAGNOSIS — Z1231 Encounter for screening mammogram for malignant neoplasm of breast: Secondary | ICD-10-CM

## 2017-10-24 ENCOUNTER — Telehealth: Payer: Self-pay | Admitting: *Deleted

## 2017-10-24 ENCOUNTER — Encounter: Payer: Self-pay | Admitting: Internal Medicine

## 2017-10-24 ENCOUNTER — Ambulatory Visit (INDEPENDENT_AMBULATORY_CARE_PROVIDER_SITE_OTHER): Payer: Medicare Other | Admitting: Internal Medicine

## 2017-10-24 VITALS — BP 140/80 | HR 98 | Temp 98.8°F | Ht 63.25 in | Wt 132.0 lb

## 2017-10-24 DIAGNOSIS — Z860100 Personal history of colon polyps, unspecified: Secondary | ICD-10-CM

## 2017-10-24 DIAGNOSIS — Z0000 Encounter for general adult medical examination without abnormal findings: Secondary | ICD-10-CM | POA: Diagnosis not present

## 2017-10-24 DIAGNOSIS — E785 Hyperlipidemia, unspecified: Secondary | ICD-10-CM | POA: Diagnosis not present

## 2017-10-24 DIAGNOSIS — Z720 Tobacco use: Secondary | ICD-10-CM | POA: Diagnosis not present

## 2017-10-24 DIAGNOSIS — I10 Essential (primary) hypertension: Secondary | ICD-10-CM

## 2017-10-24 DIAGNOSIS — R319 Hematuria, unspecified: Secondary | ICD-10-CM | POA: Diagnosis not present

## 2017-10-24 DIAGNOSIS — Z23 Encounter for immunization: Secondary | ICD-10-CM | POA: Diagnosis not present

## 2017-10-24 LAB — CBC WITH DIFFERENTIAL/PLATELET
BASOS PCT: 0.5 % (ref 0.0–3.0)
Basophils Absolute: 0 10*3/uL (ref 0.0–0.1)
EOS PCT: 0.5 % (ref 0.0–5.0)
Eosinophils Absolute: 0 10*3/uL (ref 0.0–0.7)
LYMPHS PCT: 13.3 % (ref 12.0–46.0)
Lymphs Abs: 1.1 10*3/uL (ref 0.7–4.0)
MCHC: 33.7 g/dL (ref 30.0–36.0)
MCV: 105.9 fl — AB (ref 78.0–100.0)
MONOS PCT: 5.5 % (ref 3.0–12.0)
Monocytes Absolute: 0.5 10*3/uL (ref 0.1–1.0)
NEUTROS ABS: 6.9 10*3/uL (ref 1.4–7.7)
Neutrophils Relative %: 80.2 % — ABNORMAL HIGH (ref 43.0–77.0)
PLATELETS: 150 10*3/uL (ref 150.0–400.0)
RBC: 5.23 Mil/uL — ABNORMAL HIGH (ref 3.87–5.11)
RDW: 13.1 % (ref 11.5–15.5)
WBC: 8.6 10*3/uL (ref 4.0–10.5)

## 2017-10-24 LAB — POCT URINALYSIS DIPSTICK
BILIRUBIN UA: NEGATIVE
Glucose, UA: NEGATIVE
Ketones, UA: NEGATIVE
Leukocytes, UA: NEGATIVE
NITRITE UA: NEGATIVE
Spec Grav, UA: 1.015 (ref 1.010–1.025)
Urobilinogen, UA: 0.2 E.U./dL
pH, UA: 7 (ref 5.0–8.0)

## 2017-10-24 LAB — COMPREHENSIVE METABOLIC PANEL
ALBUMIN: 4.6 g/dL (ref 3.5–5.2)
ALT: 22 U/L (ref 0–35)
AST: 35 U/L (ref 0–37)
Alkaline Phosphatase: 62 U/L (ref 39–117)
BUN: 9 mg/dL (ref 6–23)
CALCIUM: 10.4 mg/dL (ref 8.4–10.5)
CHLORIDE: 98 meq/L (ref 96–112)
CO2: 34 meq/L — AB (ref 19–32)
Creatinine, Ser: 0.64 mg/dL (ref 0.40–1.20)
GFR: 115.66 mL/min (ref >60.00)
Glucose, Bld: 94 mg/dL (ref 70–99)
POTASSIUM: 3.4 meq/L — AB (ref 3.5–5.1)
Sodium: 142 mEq/L (ref 135–145)
Total Bilirubin: 1 mg/dL (ref 0.2–1.2)
Total Protein: 7.7 g/dL (ref 6.0–8.3)

## 2017-10-24 LAB — LIPID PANEL
CHOL/HDL RATIO: 2
Cholesterol: 185 mg/dL (ref 0–200)
HDL: 76.7 mg/dL (ref >39.00)
LDL CALC: 90 mg/dL (ref 0–99)
NonHDL: 107.99
TRIGLYCERIDES: 91 mg/dL (ref 0.0–149.0)
VLDL: 18.2 mg/dL (ref 0.0–40.0)

## 2017-10-24 LAB — TSH: TSH: 1.89 u[IU]/mL (ref 0.35–4.50)

## 2017-10-24 NOTE — Patient Instructions (Addendum)
Limit your sodium (Salt) intake  Please check your blood pressure on a regular basis.  If it is consistently greater than 150/90, please make an office appointment.    It is important that you exercise regularly, at least 20 minutes 3 to 4 times per week.  If you develop chest pain or shortness of breath seek  medical attention.  Return in one year for follow-up  Please see your eye doctor yearly to check for eye damage  Schedule your colonoscopy to help detect colon cancer.

## 2017-10-24 NOTE — Progress Notes (Signed)
Subjective:    Patient ID: Tracy Davidson, female    DOB: 1941-09-17, 77 y.o.   MRN: [id number]  HPI  77 year old patient who is seen today for a preventive health examination as well as subsequent Medicare wellness visit.  He has not been seen in over one year.  She has a history of essential hypertension as well as history of colonic polyps. She has a history also of ongoing tobacco use.  No cardiopulmonary complaints  Patient has had a history of gross hematuria for the past 3 days No flu vaccine this year Mammogram obtained 6 days ago  Past Medical History:  Diagnosis Date  . ALLERGIC RHINITIS 09/20/2009  . Bipolar affective disorder ([place])   . COLONIC POLYPS, HX OF 06/06/2007  . DYSLIPIDEMIA 09/20/2009  . HYPERTENSION 06/06/2007  . LIVER FUNCTION TESTS, ABNORMAL, HX OF 09/21/2009     Social History   Socioeconomic History  . Marital status: Single    Spouse name: Not on file  . Number of children: Not on file  . Years of education: Not on file  . Highest education level: Not on file  Social Needs  . Financial resource strain: Not on file  . Food insecurity - worry: Not on file  . Food insecurity - inability: Not on file  . Transportation needs - medical: Not on file  . Transportation needs - non-medical: Not on file  Occupational History  . Not on file  Tobacco Use  . Smoking status: Current Every Day Smoker    Packs/day: 1.00    Types: Cigarettes  . Smokeless tobacco: Never Used  Substance and Sexual Activity  . Alcohol use: Yes  . Drug use: No  . Sexual activity: Not on file  Other Topics Concern  . Not on file  Social History Narrative  . Not on file    Past Surgical History:  Procedure Laterality Date  . BREAST EXCISIONAL BIOPSY Left 1975  . BREAST SURGERY     breast bx  . DILATION AND CURETTAGE OF UTERUS    . TONSILLECTOMY    . TOTAL ABDOMINAL HYSTERECTOMY      History reviewed. No pertinent family history.  Allergies  Allergen Reactions    . Doxycycline     REACTION: tachycardia    Current Outpatient Medications on File Prior to Visit  Medication Sig Dispense Refill  . KLOR-CON M20 20 MEQ tablet TAKE 1 TABLET BY MOUTH EVERY DAY 90 tablet 1  . lisinopril-hydrochlorothiazide (PRINZIDE,ZESTORETIC) 20-25 MG tablet TAKE 1 TABLET BY MOUTH EVERY DAY 30 tablet 0  . triamcinolone (KENALOG) 0.025 % ointment Apply 1 application topically 2 (two) times daily. 30 g 3   No current facility-administered medications on file prior to visit.     BP 140/80 (BP Location: Left Arm, Patient Position: Sitting, Cuff Size: Small)   Pulse 98   Temp 98.8 F (37.1 C) (Oral)   Ht 5' 3.25" (1.607 m)   Wt 132 lb (59.9 kg)   SpO2 96%   BMI 23.20 kg/m   Subsequent Medicare wellness visit  1. Risk factors, based on past  M,S,F history.  Cardiovascular risk factors include hypertension and ongoing tobacco use  2.  Physical activities: Very sedentary.  Walks infrequently due to knee discomfort  3.  Depression/mood: No history of major depression  4.  Hearing: No deficits  5.  ADL's: Independent  6.  Fall risk: Low  7.  Home safety: No problems identified  8.  Height weight, and  visual acuity; height and weight stable no change in visual acuity.  She does have a history of cataracts.  Has ophthalmology follow-up scheduled soon  9.  Counseling: Continue heart healthy diet.  More regular exercise encouraged  10. Lab orders based on risk factors: Laboratory update will be reviewed  11. Referral : Ophthalmology.  Refer to GI for follow-up colonoscopy  12. Care plan: Continue efforts at aggressive risk factor modification  13. Cognitive assessment: Alert and oriented with normal affect.  No cognitive dysfunction  14. Screening: Patient provided with a written and personalized 5-10 year screening schedule in the AVS.    15. Provider List Update: Ophthalmology primary care GI    Review of Systems  Constitutional: Negative.   HENT:  Negative for congestion, dental problem, hearing loss, rhinorrhea, sinus pressure, sore throat and tinnitus.   Eyes: Negative for pain, discharge and visual disturbance.  Respiratory: Negative for cough and shortness of breath.   Cardiovascular: Negative for chest pain, palpitations and leg swelling.  Gastrointestinal: Negative for abdominal distention, abdominal pain, blood in stool, constipation, diarrhea, nausea and vomiting.  Genitourinary: Positive for hematuria. Negative for difficulty urinating, dysuria, flank pain, frequency, pelvic pain, urgency, vaginal bleeding, vaginal discharge and vaginal pain.  Musculoskeletal: Positive for arthralgias and back pain. Negative for gait problem and joint swelling.  Skin: Negative for rash.  Neurological: Negative for dizziness, syncope, speech difficulty, weakness, numbness and headaches.  Hematological: Negative for adenopathy.  Psychiatric/Behavioral: Negative for agitation, behavioral problems and dysphoric mood. The patient is not nervous/anxious.        Objective:   Physical Exam  Constitutional: She is oriented to person, place, and time. She appears well-developed and well-nourished.  HENT:  Head: Normocephalic and atraumatic.  Right Ear: External ear normal.  Left Ear: External ear normal.  Mouth/Throat: Oropharynx is clear and moist.  Eyes: Conjunctivae and EOM are normal.  Neck: Normal range of motion. Neck supple. No JVD present. No thyromegaly present.  Cardiovascular: Normal rate, regular rhythm, normal heart sounds and intact distal pulses.  No murmur heard. Pedal pulses faint  Pulmonary/Chest: Effort normal and breath sounds normal. She has no wheezes. She has no rales.  Abdominal: Soft. Bowel sounds are normal. She exhibits no distension and no mass. There is no tenderness. There is no rebound and no guarding.  Genitourinary: Vagina normal.  Musculoskeletal: Normal range of motion. She exhibits no edema or tenderness.    Neurological: She is alert and oriented to person, place, and time. She has normal reflexes. No cranial nerve deficit. She exhibits normal muscle tone. Coordination normal.  Skin: Skin is warm and dry. No rash noted.  Psychiatric: She has a normal mood and affect. Her behavior is normal.          Assessment & Plan:   Preventive health examination Subsequent Medicare wellness visit  Total smoking cessation encouraged.  Flu vaccine administered we will review screening lab.  Patient has had recent mammogram  History of colonic polyps.  Schedule follow-up colonoscopy Essential hypertension well-controlled Gross hematuria.  We will set up for urological evaluation if hematuria confirmed Cataracts.  Follow-up ophthalmology  Nyoka Cowden

## 2017-10-24 NOTE — Telephone Encounter (Signed)
CRITICAL VALUE STICKER  CRITICAL VALUE: Hgb 18.7  RECEIVER (on-site recipient of call): Honor Loh  DATE & TIME NOTIFIED: 10/24/17 1334  MESSENGER (representative from lab): Santiago Glad @ [facility]  MD NOTIFIED: Dr. Bluford Kaufmann  TIME OF NOTIFICATION: 2162  RESPONSE: No new orders received.

## 2017-10-25 ENCOUNTER — Telehealth: Payer: Self-pay | Admitting: *Deleted

## 2017-10-25 NOTE — Telephone Encounter (Signed)
Patient would like to know if you would fill sign a handicap placard for her due to chronic knee/leg pain that limits mobility. States she forgot to mention this at appt yesterday. Please advise.

## 2017-10-25 NOTE — Telephone Encounter (Signed)
Yes, I will complete forms for [employer] parking placard.  Okay to mail or have patient come by for pickup

## 2017-10-25 NOTE — Addendum Note (Signed)
Addended by: Dorrene German on: 10/25/2017 11:33 AM   Modules accepted: Orders

## 2017-10-26 NOTE — Telephone Encounter (Signed)
Form completed by Dr. Burnice Logan. Mailed to address on file per pt's request. Copy sent for scanning.

## 2017-11-12 ENCOUNTER — Other Ambulatory Visit: Payer: Self-pay | Admitting: Internal Medicine

## 2017-11-27 ENCOUNTER — Ambulatory Visit (INDEPENDENT_AMBULATORY_CARE_PROVIDER_SITE_OTHER): Payer: Medicare Other | Admitting: Internal Medicine

## 2017-11-27 ENCOUNTER — Encounter: Payer: Self-pay | Admitting: *Deleted

## 2017-11-27 VITALS — BP 176/80 | HR 80 | Temp 98.3°F | Ht 63.2 in | Wt 132.0 lb

## 2017-11-27 DIAGNOSIS — D751 Secondary polycythemia: Secondary | ICD-10-CM

## 2017-11-27 DIAGNOSIS — I10 Essential (primary) hypertension: Secondary | ICD-10-CM | POA: Diagnosis not present

## 2017-11-27 DIAGNOSIS — R310 Gross hematuria: Secondary | ICD-10-CM

## 2017-11-27 DIAGNOSIS — E785 Hyperlipidemia, unspecified: Secondary | ICD-10-CM

## 2017-11-27 DIAGNOSIS — Z720 Tobacco use: Secondary | ICD-10-CM

## 2017-11-27 LAB — CBC WITH DIFFERENTIAL/PLATELET
BASOS PCT: 0.6 % (ref 0.0–3.0)
Basophils Absolute: 0 10*3/uL (ref 0.0–0.1)
EOS ABS: 0.1 10*3/uL (ref 0.0–0.7)
EOS PCT: 0.9 % (ref 0.0–5.0)
HEMATOCRIT: 54.8 % — AB (ref 36.0–46.0)
Hemoglobin: 18.5 g/dL (ref 12.0–15.0)
LYMPHS PCT: 16.9 % (ref 12.0–46.0)
Lymphs Abs: 1.3 10*3/uL (ref 0.7–4.0)
MCHC: 33.8 g/dL (ref 30.0–36.0)
MCV: 104.8 fl — ABNORMAL HIGH (ref 78.0–100.0)
Monocytes Absolute: 0.5 10*3/uL (ref 0.1–1.0)
Monocytes Relative: 6.6 % (ref 3.0–12.0)
Neutro Abs: 5.8 10*3/uL (ref 1.4–7.7)
Neutrophils Relative %: 75 % (ref 43.0–77.0)
Platelets: 137 10*3/uL — ABNORMAL LOW (ref 150.0–400.0)
RBC: 5.23 Mil/uL — AB (ref 3.87–5.11)
RDW: 13.5 % (ref 11.5–15.5)
WBC: 7.8 10*3/uL (ref 4.0–10.5)

## 2017-11-27 NOTE — Patient Instructions (Signed)
Urology consultation as discussed  Colonoscopy as scheduled  Smoking tobacco is very bad for your health. You should stop smoking immediately.  Please check your blood pressure on a regular basis.  If it is consistently greater than 150/90, please make an office appointment.  Return in 3 months for follow-up

## 2017-11-27 NOTE — Progress Notes (Signed)
Subjective:    Patient ID: Tracy Davidson, female    DOB: 1941/06/20, 77 y.o.   MRN: [id number]  HPI  77 year old patient who was seen last month for her annual physical.  At that time she gave a history of intermittent gross hematuria.  Urinalysis confirmed microscopic hematuria CBC also revealed polycythemia in the setting of ongoing tobacco use. She was asked to return today for repeat CBC. She is on lisinopril hydrochlorothiazide for essential hypertension.  Blood pressure on arrival today 180/80  Past Medical History:  Diagnosis Date  . ALLERGIC RHINITIS 09/20/2009  . Bipolar affective disorder ([place])   . COLONIC POLYPS, HX OF 06/06/2007  . DYSLIPIDEMIA 09/20/2009  . HYPERTENSION 06/06/2007  . LIVER FUNCTION TESTS, ABNORMAL, HX OF 09/21/2009     Social History   Socioeconomic History  . Marital status: Single    Spouse name: Not on file  . Number of children: Not on file  . Years of education: Not on file  . Highest education level: Not on file  Social Needs  . Financial resource strain: Not on file  . Food insecurity - worry: Not on file  . Food insecurity - inability: Not on file  . Transportation needs - medical: Not on file  . Transportation needs - non-medical: Not on file  Occupational History  . Not on file  Tobacco Use  . Smoking status: Current Every Day Smoker    Packs/day: 1.00    Types: Cigarettes  . Smokeless tobacco: Never Used  Substance and Sexual Activity  . Alcohol use: Yes  . Drug use: No  . Sexual activity: Not on file  Other Topics Concern  . Not on file  Social History Narrative  . Not on file    Past Surgical History:  Procedure Laterality Date  . BREAST EXCISIONAL BIOPSY Left 1975  . BREAST SURGERY     breast bx  . DILATION AND CURETTAGE OF UTERUS    . TONSILLECTOMY    . TOTAL ABDOMINAL HYSTERECTOMY      History reviewed. No pertinent family history.  Allergies  Allergen Reactions  . Doxycycline     REACTION: tachycardia      Current Outpatient Medications on File Prior to Visit  Medication Sig Dispense Refill  . KLOR-CON M20 20 MEQ tablet TAKE 1 TABLET BY MOUTH EVERY DAY 90 tablet 1  . lisinopril-hydrochlorothiazide (PRINZIDE,ZESTORETIC) 20-25 MG tablet TAKE 1 TABLET BY MOUTH EVERY DAY 30 tablet 0   No current facility-administered medications on file prior to visit.     BP (!) 176/80 (BP Location: Right Arm, Patient Position: Sitting, Cuff Size: Normal)   Pulse 80   Temp 98.3 F (36.8 C) (Oral)   Ht 5' 3.2" (1.605 m)   Wt 132 lb (59.9 kg)   BMI 23.23 kg/m     Review of Systems  Constitutional: Negative.   HENT: Negative for congestion, dental problem, hearing loss, rhinorrhea, sinus pressure, sore throat and tinnitus.   Eyes: Negative for pain, discharge and visual disturbance.  Respiratory: Negative for cough and shortness of breath.   Cardiovascular: Negative for chest pain, palpitations and leg swelling.  Gastrointestinal: Negative for abdominal distention, abdominal pain, blood in stool, constipation, diarrhea, nausea and vomiting.  Genitourinary: Positive for hematuria. Negative for difficulty urinating, dysuria, flank pain, frequency, pelvic pain, urgency, vaginal bleeding, vaginal discharge and vaginal pain.  Musculoskeletal: Negative for arthralgias, gait problem and joint swelling.  Skin: Negative for rash.  Neurological: Negative for dizziness, syncope, speech  difficulty, weakness, numbness and headaches.  Hematological: Negative for adenopathy.  Psychiatric/Behavioral: Negative for agitation, behavioral problems and dysphoric mood. The patient is not nervous/anxious.        Objective:   Physical Exam  Constitutional: She appears well-developed and well-nourished. No distress.  Repeat blood pressure 144/78          Assessment & Plan:  Intermittent gross hematuria.  We will set up for urological evaluation  Essential hypertension Secondary polycythemia.  Total smoking  cessation encouraged  Health maintenance.  Colonoscopy scheduled for next month  Nyoka Cowden

## 2017-12-10 ENCOUNTER — Other Ambulatory Visit: Payer: Self-pay | Admitting: Internal Medicine

## 2017-12-11 NOTE — Telephone Encounter (Signed)
Spoke to [place] at [facility] and she confirmed that the medication was received. No further action needed.

## 2017-12-13 ENCOUNTER — Other Ambulatory Visit: Payer: Self-pay

## 2017-12-13 ENCOUNTER — Ambulatory Visit (AMBULATORY_SURGERY_CENTER): Payer: Self-pay | Admitting: *Deleted

## 2017-12-13 VITALS — Ht 63.0 in | Wt 132.2 lb

## 2017-12-13 DIAGNOSIS — Z86010 Personal history of colonic polyps: Secondary | ICD-10-CM

## 2017-12-13 NOTE — Progress Notes (Signed)
No egg or soy allergy known to patient  No issues with past sedation with any surgeries  or procedures, no intubation problems  No diet pills per patient No home 02 use per patient  No blood thinners per patient  Pt denies issues with constipation  No A fib or A flutter  EMMI video sent to pt's e mail pt. Does not have Email

## 2017-12-20 ENCOUNTER — Encounter: Payer: Self-pay | Admitting: Internal Medicine

## 2017-12-27 ENCOUNTER — Other Ambulatory Visit: Payer: Self-pay

## 2017-12-27 ENCOUNTER — Encounter: Payer: Self-pay | Admitting: Internal Medicine

## 2017-12-27 ENCOUNTER — Ambulatory Visit (AMBULATORY_SURGERY_CENTER): Payer: Medicare Other | Admitting: Internal Medicine

## 2017-12-27 VITALS — BP 112/73 | HR 75 | Temp 99.3°F | Resp 18 | Ht 63.0 in | Wt 132.0 lb

## 2017-12-27 DIAGNOSIS — D128 Benign neoplasm of rectum: Secondary | ICD-10-CM

## 2017-12-27 DIAGNOSIS — D122 Benign neoplasm of ascending colon: Secondary | ICD-10-CM

## 2017-12-27 DIAGNOSIS — D125 Benign neoplasm of sigmoid colon: Secondary | ICD-10-CM

## 2017-12-27 DIAGNOSIS — Z86010 Personal history of colonic polyps: Secondary | ICD-10-CM | POA: Diagnosis present

## 2017-12-27 DIAGNOSIS — K621 Rectal polyp: Secondary | ICD-10-CM | POA: Diagnosis not present

## 2017-12-27 MED ORDER — SODIUM CHLORIDE 0.9 % IV SOLN: 500.0000 mL | Freq: Once | INTRAVENOUS | Status: DC

## 2017-12-27 NOTE — Progress Notes (Signed)
Called to room to assist during endoscopic procedure.  Patient ID and intended procedure confirmed with present staff. Received instructions for my participation in the procedure from the performing physician.  

## 2017-12-27 NOTE — Progress Notes (Signed)
No change in medical or surgical hx since PV per pt

## 2017-12-27 NOTE — Patient Instructions (Addendum)
I found and removed 3 polyps - 2 of the three were collected. The one that was not picked up was very tiny and not a problem.  You also have a condition called diverticulosis - common and not usually a problem. Please read the handout provided.  I also saw blood vessels on the surface called AVM's or angiodysplasia. These are not causing you any problems and hopefully will not.  Based upon your overall history and age I do not think you will need a routine repeat colonoscopy.  I appreciate the opportunity to care for you. Gatha Mayer, MD, FACG YOU HAD AN ENDOSCOPIC PROCEDURE TODAY AT [facility]:   Refer to the procedure report that was given to you for any specific questions about what was found during the examination.  If the procedure report does not answer your questions, please call your gastroenterologist to clarify.  If you requested that your care partner not be given the details of your procedure findings, then the procedure report has been included in a sealed envelope for you to review at your convenience later.  YOU SHOULD EXPECT: Some feelings of bloating in the abdomen. Passage of more gas than usual.  Walking can help get rid of the air that was put into your GI tract during the procedure and reduce the bloating. If you had a lower endoscopy (such as a colonoscopy or flexible sigmoidoscopy) you may notice spotting of blood in your stool or on the toilet paper. If you underwent a bowel prep for your procedure, you may not have a normal bowel movement for a few days.  Please Note:  You might notice some irritation and congestion in your nose or some drainage.  This is from the oxygen used during your procedure.  There is no need for concern and it should clear up in a day or so.  SYMPTOMS TO REPORT IMMEDIATELY:   Following lower endoscopy (colonoscopy or flexible sigmoidoscopy):  Excessive amounts of blood in the stool  Significant tenderness or  worsening of abdominal pains  Swelling of the abdomen that is new, acute  Fever of 100F or higher   For urgent or emergent issues, a gastroenterologist can be reached at any hour by calling [phone].   DIET:  We do recommend a small meal at first, but then you may proceed to your regular diet.  Drink plenty of fluids but you should avoid alcoholic beverages for 24 hours.  MEDICATIONS: Continue present medications. No Aspirin, Ibuprofen, Naproxen, or other non-steroidal anti-inflammatory drugs for 2 weeks after polyp removal.   Please see handouts given to you by your recovery nurse.  ACTIVITY:  You should plan to take it easy for the rest of today and you should NOT DRIVE or use heavy machinery until tomorrow (because of the sedation medicines used during the test).    FOLLOW UP: Our staff will call the number listed on your records the next business day following your procedure to check on you and address any questions or concerns that you may have regarding the information given to you following your procedure. If we do not reach you, we will leave a message.  However, if you are feeling well and you are not experiencing any problems, there is no need to return our call.  We will assume that you have returned to your regular daily activities without incident.  If any biopsies were taken you will be contacted by phone or by letter within the  next 1-3 weeks.  Please call us at [phone] if you have not heard about the biopsies in 3 weeks.   Thank you for allowing [place] to provide for your healthcare needs today.  SIGNATURES/CONFIDENTIALITY: You and/or your care partner have signed paperwork which will be entered into your electronic medical record.  These signatures attest to the fact that that the information above on your After Visit Summary has been reviewed and is understood.  Full responsibility of the confidentiality of this discharge information lies with you and/or your  care-partner.

## 2017-12-27 NOTE — Op Note (Signed)
[facility] Patient Name: Tracy Davidson Procedure Date: 12/27/2017 9:02 AM MRN: [id number] Endoscopist: Gatha Mayer , MD Age: 77 Referring MD:  Date of Birth: June 01, 1941 Gender: Female Account #: [id number] Procedure:                Colonoscopy Indications:              Surveillance: Personal history of adenomatous                            polyps on last colonoscopy > 5 years ago Medicines:                Propofol per Anesthesia, Monitored Anesthesia Care Procedure:                Pre-Anesthesia Assessment:                           - Prior to the procedure, a History and Physical                            was performed, and patient medications and                            allergies were reviewed. The patient's tolerance of                            previous anesthesia was also reviewed. The risks                            and benefits of the procedure and the sedation                            options and risks were discussed with the patient.                            All questions were answered, and informed consent                            was obtained. Prior Anticoagulants: The patient has                            taken no previous anticoagulant or antiplatelet                            agents. ASA Grade Assessment: II - A patient with                            mild systemic disease. After reviewing the risks                            and benefits, the patient was deemed in                            satisfactory condition to undergo the procedure.  After obtaining informed consent, the colonoscope                            was passed under direct vision. Throughout the                            procedure, the patient's blood pressure, pulse, and                            oxygen saturations were monitored continuously. The                            Model PCF-H190DL [id number]) scope was introduced      through the anus and advanced to the the cecum,                            identified by appendiceal orifice and ileocecal                            valve. The colonoscopy was performed without                            difficulty. The patient tolerated the procedure                            well. The quality of the bowel preparation was                            good. The ileocecal valve, appendiceal orifice, and                            rectum were photographed. The bowel preparation                            used was Miralax. Scope In: 9:08:24 AM Scope Out: 9:23:36 AM Scope Withdrawal Time: 0 hours 11 minutes 58 seconds  Total Procedure Duration: 0 hours 15 minutes 12 seconds  Findings:                 The perianal and digital rectal examinations were                            normal.                           A 8 mm polyp was found in the ascending colon. The                            polyp was semi-pedunculated. The polyp was removed                            with a hot snare. Resection and retrieval were                            complete. Verification  of patient identification                            for the specimen was done. Estimated blood loss:                            none.                           Two sessile polyps were found in the rectum and                            sigmoid colon. The polyps were diminutive in size.                            These polyps were removed with a cold snare.                            Resection was complete, but the polyp tissue was                            only partially retrieved. Verification of patient                            identification for the specimen was done. Estimated                            blood loss was minimal.                           Three small angiodysplastic lesions without                            bleeding were found in the ascending colon and in                            the cecum.                            Diverticula were found in the sigmoid colon,                            descending colon and ascending colon.                           The exam was otherwise without abnormality on                            direct and retroflexion views. Complications:            No immediate complications. Estimated Blood Loss:     Estimated blood loss was minimal. Impression:               - One 8 mm polyp in the ascending colon, removed  with a hot snare. Resected and retrieved.                           - Two diminutive polyps in the rectum and in the                            sigmoid colon, removed with a cold snare. Complete                            resection. Partial retrieval.                           - Three non-bleeding colonic angiodysplastic                            lesions.                           - Diverticulosis in the sigmoid colon, in the                            descending colon and in the ascending colon.                           - The examination was otherwise normal on direct                            and retroflexion views.                           - Personal history of colonic polyps. Recommendation:           - Patient has a contact number available for                            emergencies. The signs and symptoms of potential                            delayed complications were discussed with the                            patient. Return to normal activities tomorrow.                            Written discharge instructions were provided to the                            patient.                           - Resume previous diet.                           - Continue present medications.                           - No  aspirin, ibuprofen, naproxen, or other                            non-steroidal anti-inflammatory drugs for 2 weeks                            after polyp removal.                           - No  repeat colonoscopy due to age. Gatha Mayer, MD 12/27/2017 9:35:49 AM This report has been signed electronically.

## 2017-12-27 NOTE — Progress Notes (Signed)
Report given to PACU, vss 

## 2017-12-28 ENCOUNTER — Telehealth: Payer: Self-pay | Admitting: *Deleted

## 2017-12-28 NOTE — Telephone Encounter (Signed)
  Follow up Call-  Call back number 12/27/2017  Post procedure Call Back phone  # [phone]  Permission to leave phone message Yes  Some recent data might be hidden     Patient questions:  Do you have a fever, pain , or abdominal swelling? No. Pain Score  0 *  Have you tolerated food without any problems? Yes.    Have you been able to return to your normal activities? Yes.    Do you have any questions about your discharge instructions: Diet   No. Medications  No. Follow up visit  No.  Do you have questions or concerns about your Care? No.  Actions: * If pain score is 4 or above: No action needed, pain <4.

## 2018-01-02 ENCOUNTER — Encounter: Payer: Self-pay | Admitting: Internal Medicine

## 2018-01-02 NOTE — Progress Notes (Signed)
8 mm adenoma No recall (age)

## 2018-01-07 ENCOUNTER — Other Ambulatory Visit: Payer: Self-pay | Admitting: Internal Medicine

## 2018-01-31 ENCOUNTER — Other Ambulatory Visit: Payer: Self-pay | Admitting: Internal Medicine

## 2018-02-19 ENCOUNTER — Other Ambulatory Visit: Payer: Self-pay | Admitting: Internal Medicine

## 2018-02-28 ENCOUNTER — Other Ambulatory Visit: Payer: Self-pay | Admitting: Internal Medicine

## 2018-03-08 ENCOUNTER — Telehealth: Payer: Self-pay | Admitting: Family Medicine

## 2018-03-08 NOTE — Telephone Encounter (Signed)
Copied from [place] [phone]. Topic: General - Other >> Mar 08, 2018  4:05 PM Oneta Rack wrote:  Relation to pt: self Call back number:[phone]   Reason for call:  Patient got her car serviced and her handicap place card was placed in the sun visor and missing when car was returned Patient contacted mechanic and they advised they didn't see it. Patient requesting another place card, please advise >> Mar 08, 2018  4:08 PM Oneta Rack wrote:  Relation to pt: self Call back number:[phone]   Reason for call:  Patient got her car serviced and her handicap place card was placed in the sun visor and missing when car was returned Patient contacted mechanic and they advised they didn't see it. Patient requesting another place card, please advise

## 2018-03-12 NOTE — Telephone Encounter (Signed)
Noted! Placed in [facility] folder for completion if appropriate.

## 2018-03-13 NOTE — Telephone Encounter (Signed)
Pt called and asked if the placard can be mailed to her, call pt if needed

## 2018-03-13 NOTE — Telephone Encounter (Signed)
Left message informing pt that placard is ready for pick up

## 2018-03-15 NOTE — Telephone Encounter (Signed)
Spoke to pt to verify address. Placard mailed!

## 2018-03-26 ENCOUNTER — Other Ambulatory Visit: Payer: Self-pay | Admitting: Internal Medicine

## 2018-04-18 ENCOUNTER — Other Ambulatory Visit: Payer: Self-pay | Admitting: Internal Medicine

## 2018-05-16 ENCOUNTER — Other Ambulatory Visit: Payer: Self-pay | Admitting: Internal Medicine

## 2018-06-20 ENCOUNTER — Encounter: Payer: Self-pay | Admitting: Family Medicine

## 2018-06-20 ENCOUNTER — Other Ambulatory Visit: Payer: Self-pay | Admitting: Internal Medicine

## 2018-06-20 ENCOUNTER — Ambulatory Visit (INDEPENDENT_AMBULATORY_CARE_PROVIDER_SITE_OTHER): Payer: Medicare Other | Admitting: Family Medicine

## 2018-06-20 ENCOUNTER — Ambulatory Visit (INDEPENDENT_AMBULATORY_CARE_PROVIDER_SITE_OTHER): Payer: Medicare Other

## 2018-06-20 VITALS — BP 120/80 | HR 80 | Temp 98.3°F | Ht 63.0 in | Wt 125.0 lb

## 2018-06-20 DIAGNOSIS — R319 Hematuria, unspecified: Secondary | ICD-10-CM

## 2018-06-20 DIAGNOSIS — M25561 Pain in right knee: Secondary | ICD-10-CM | POA: Diagnosis not present

## 2018-06-20 DIAGNOSIS — R634 Abnormal weight loss: Secondary | ICD-10-CM

## 2018-06-20 DIAGNOSIS — M25511 Pain in right shoulder: Secondary | ICD-10-CM | POA: Diagnosis not present

## 2018-06-20 DIAGNOSIS — I10 Essential (primary) hypertension: Secondary | ICD-10-CM | POA: Diagnosis not present

## 2018-06-20 DIAGNOSIS — F17210 Nicotine dependence, cigarettes, uncomplicated: Secondary | ICD-10-CM

## 2018-06-20 NOTE — Patient Instructions (Addendum)
To schedule your appointment with urology all you need to do is call.  The number for [facility] is [phone].  Their office is here in [place].  You can try Biofreeze for your knee pain.  This can be found over-the-counter at [employer], [employer], or your local drugstore.  When your x-ray results are available we will call you. Knee Pain, Adult Many things can cause knee pain. The pain often goes away on its own with time and rest. If the pain does not go away, tests may be done to find out what is causing the pain. Follow these instructions at home: Activity  Rest your knee.  Do not do things that cause pain.  Avoid activities where both feet leave the ground at the same time (high-impact activities). Examples are running, jumping rope, and doing jumping jacks. General instructions  Take medicines only as told by your doctor.  Raise (elevate) your knee when you are resting. Make sure your knee is higher than your heart.  Sleep with a pillow under your knee.  If told, put ice on the knee: ? Put ice in a plastic bag. ? Place a towel between your skin and the bag. ? Leave the ice on for 20 minutes, 2-3 times a day.  Ask your doctor if you should wear an elastic knee support.  Lose weight if you are overweight. Being overweight can make your knee hurt more.  Do not use any tobacco products. These include cigarettes, chewing tobacco, or electronic cigarettes. If you need help quitting, ask your doctor. Smoking may slow down healing. Contact a doctor if:  The pain does not stop.  The pain changes or gets worse.  You have a fever along with knee pain.  Your knee gives out or locks up.  Your knee swells, and becomes worse. Get help right away if:  Your knee feels warm.  You cannot move your knee.  You have very bad knee pain.  You have chest pain.  You have trouble breathing. Summary  Many things can cause knee pain. The pain often goes away on its own with  time and rest.  Avoid activities that put stress on your knee. These include running and jumping rope.  Get help right away if you cannot move your knee, or if your knee feels warm, or if you have trouble breathing. This information is not intended to replace advice given to you by your health care provider. Make sure you discuss any questions you have with your health care provider. Document Released: 12/29/2008 Document Revised: 09/26/2016 Document Reviewed: 09/26/2016 Elsevier Interactive Patient Education  2017 [employer].  Steps to Quit Smoking Smoking tobacco can be bad for your health. It can also affect almost every organ in your body. Smoking puts you and people around you at risk for many serious long-lasting (chronic) diseases. Quitting smoking is hard, but it is one of the best things that you can do for your health. It is never too late to quit. What are the benefits of quitting smoking? When you quit smoking, you lower your risk for getting serious diseases and conditions. They can include:  Lung cancer or lung disease.  Heart disease.  Stroke.  Heart attack.  Not being able to have children (infertility).  Weak bones (osteoporosis) and broken bones (fractures).  If you have coughing, wheezing, and shortness of breath, those symptoms may get better when you quit. You may also get sick less often. If you are pregnant, quitting smoking  can help to lower your chances of having a baby of low birth weight. What can I do to help me quit smoking? Talk with your doctor about what can help you quit smoking. Some things you can do (strategies) include:  Quitting smoking totally, instead of slowly cutting back how much you smoke over a period of time.  Going to in-person counseling. You are more likely to quit if you go to many counseling sessions.  Using resources and support systems, such as: ? Database administrator with a Social worker. ? Phone quitlines. ? Clinical research associate. ? Support groups or group counseling. ? Text messaging programs. ? Mobile phone apps or applications.  Taking medicines. Some of these medicines may have nicotine in them. If you are pregnant or breastfeeding, do not take any medicines to quit smoking unless your doctor says it is okay. Talk with your doctor about counseling or other things that can help you.  Talk with your doctor about using more than one strategy at the same time, such as taking medicines while you are also going to in-person counseling. This can help make quitting easier. What things can I do to make it easier to quit? Quitting smoking might feel very hard at first, but there is a lot that you can do to make it easier. Take these steps:  Talk to your family and friends. Ask them to support and encourage you.  Call phone quitlines, reach out to support groups, or work with a Social worker.  Ask people who smoke to not smoke around you.  Avoid places that make you want (trigger) to smoke, such as: ? Bars. ? Parties. ? Smoke-break areas at work.  Spend time with people who do not smoke.  Lower the stress in your life. Stress can make you want to smoke. Try these things to help your stress: ? Getting regular exercise. ? Deep-breathing exercises. ? Yoga. ? Meditating. ? Doing a body scan. To do this, close your eyes, focus on one area of your body at a time from head to toe, and notice which parts of your body are tense. Try to relax the muscles in those areas.  Download or buy apps on your mobile phone or tablet that can help you stick to your quit plan. There are many free apps, such as QuitGuide from the [employer] Office manager for Disease Control and Prevention). You can find more support from smokefree.gov and other websites.  This information is not intended to replace advice given to you by your health care provider. Make sure you discuss any questions you have with your health care provider. Document Released:  07/29/2009 Document Revised: 05/30/2016 Document Reviewed: 02/16/2015 Elsevier Interactive Patient Education  2018 [employer].

## 2018-06-20 NOTE — Progress Notes (Signed)
Subjective:    Patient ID: Tracy Davidson, female    DOB: Jun 03, 1941, 77 y.o.   MRN: [id number]  No chief complaint on file.   HPI Patient was seen today for f/u on chronic conditions and TOC, previously seen by Dr. Cordelia Pen.  HTN: -Taking lisinopril- 20-25 mg daily and klor-con 20 mEq  Nicotine use: -smoking 1ppd -started in 1963  Wt loss: -pt endorses continued wt loss for a while -noted decreased appetite x 2-3 yrs. -may eat 1 meal per day as does not feel hungry  R knee pain: -pt notes h/o stiffness in b/l knees x yrs -x 2 wks notes pain in lateral R knee with radiation up and down R leg. -pain between a sharp and dull sensation. -notes first thing in am and throughout the day. -worse with standing.   -tried soltice and blu emu without relief.  Hematuria: -microscopic hematuria noted in Feb 2018 on [facility]. -pt states she did not hear anything about the referral. -still has occasional hematuria  R shoulder pain: -recently notes pain with certain movements -denies injury or inability to move arm. -has not tried anything for this  Past Medical History:  Diagnosis Date  . ALLERGIC RHINITIS 09/20/2009  . Allergy   . Arthritis   . Bipolar affective disorder ([place])   . Cataract   . COLONIC POLYPS, HX OF 06/06/2007  . DYSLIPIDEMIA 09/20/2009  . HYPERTENSION 06/06/2007  . LIVER FUNCTION TESTS, ABNORMAL, HX OF 09/21/2009    Allergies  Allergen Reactions  . Doxycycline     REACTION: tachycardia    ROS General: Denies fever, chills, night sweats  +weight loss, decreased appetite  HEENT: Denies headaches, ear pain, rhinorrhea, sore throat +blurred vision 2/2 cataracts CV: Denies CP, palpitations, SOB, orthopnea Pulm: Denies SOB, cough, wheezing GI: Denies abdominal pain, nausea, vomiting, diarrhea, constipation GU: Denies dysuria, frequency, vaginal discharge  +hematuria Msk: Denies muscle cramps   +b/l knee stiffness, R knee pain, R shoulder pain Neuro: Denies  weakness, numbness, tingling Skin: Denies rashes, bruising Psych: Denies depression, anxiety, hallucinations     Objective:    Blood pressure 120/80, pulse 80, temperature 98.3 F (36.8 C), temperature source Oral, height 5\' 3"  (1.6 m), weight 125 lb (56.7 kg), SpO2 98 %.   Gen. Pleasant, well-nourished, in no distress, normal affect   HEENT: wearing glasses, [place]/AT, face symmetric, no scleral icterus, nares patent without drainage, pharynx without erythema or exudate. Lungs: no accessory muscle use, CTAB, no wheezes or rales Cardiovascular: RRR, no m/r/g, no peripheral edema Musculoskeletal: No TTP of R shoulder, FROM, no deformities, +R cross arm causing pain in R shoulder.  Negative empty can, neers, hawkins.  R knee with crepitus, TTP of lateral R knee joint line.  No deformities, no cyanosis or clubbing, normal tone Neuro:  A&Ox3, CN II-XII intact, normal gait Skin:  Warm, no lesions/ rash   Wt Readings from Last 3 Encounters:  06/20/18 125 lb (56.7 kg)  12/27/17 132 lb (59.9 kg)  12/13/17 132 lb 3.2 oz (60 kg)    Lab Results  Component Value Date   WBC 7.8 11/27/2017   HGB 18.5 (HH) 11/27/2017   HCT 54.8 (H) 11/27/2017   PLT 137.0 (L) 11/27/2017   GLUCOSE 94 10/24/2017   CHOL 185 10/24/2017   TRIG 91.0 10/24/2017   HDL 76.70 10/24/2017   LDLDIRECT 143.2 03/14/2011   LDLCALC 90 10/24/2017   ALT 22 10/24/2017   AST 35 10/24/2017   NA 142 10/24/2017   K  3.4 (L) 10/24/2017   CL 98 10/24/2017   CREATININE 0.64 10/24/2017   BUN 9 10/24/2017   CO2 34 (H) 10/24/2017   TSH 1.89 10/24/2017    Assessment/Plan:  Essential hypertension -controlled -continue lisinopril-HCTZ 20-25 and klor-con 20 mEq  Weight loss  -concern for malignancy given h/o hematuria and cigarette use.  Will also evaluate other causes such as medications, gerd, etc. - Plan: CBC (no diff), Comprehensive metabolic panel, TSH, Urinalysis with Reflex Microscopic  Acute pain of right knee   -discussed possible causes of pain including arthritis -can use biofreeze and tylenol arthritis strength - Plan: DG Knee Complete 4 Views Right  Acute pain of right shoulder -likely 2/2 AC arthritis, impingement on exam -discussed using biofreeze or other OTC products.  Consider tylenol prn  -f/u for continue symptoms  Cigarette nicotine dependence without complication -Smoking cessation counseling greater than 3 minutes, less than 10 minutes -Patient encouraged to consider cutting down.  Currently smoking 1 ppd. -will continue to address at each visit  Hematuria, unspecified type  -referral still in place. Pt advised to contact [facility] to schedule an appointment - Plan: Urinalysis with Reflex Microscopic  F/u in the next month  [name], MD

## 2018-07-19 ENCOUNTER — Other Ambulatory Visit: Payer: Self-pay | Admitting: Internal Medicine

## 2018-07-19 NOTE — Telephone Encounter (Signed)
Now a Dr.Banks Pt  Okay for refill?

## 2018-08-12 ENCOUNTER — Other Ambulatory Visit: Payer: Self-pay | Admitting: Urology

## 2018-08-13 ENCOUNTER — Encounter (HOSPITAL_BASED_OUTPATIENT_CLINIC_OR_DEPARTMENT_OTHER): Payer: Self-pay | Admitting: *Deleted

## 2018-08-13 ENCOUNTER — Other Ambulatory Visit: Payer: Self-pay

## 2018-08-13 NOTE — Progress Notes (Signed)
Spoke with Tracy Davidson after midnight food clear liquids from midnight until 800 am then Davidson  arrrive 1220 pm 08-16-18 [facility] No meds to take Will arrange driver  Needs I stat 4 and ekg Surgery orders need 2nd sign

## 2018-08-14 ENCOUNTER — Other Ambulatory Visit: Payer: Self-pay | Admitting: Internal Medicine

## 2018-08-15 ENCOUNTER — Other Ambulatory Visit: Payer: Self-pay | Admitting: Family Medicine

## 2018-08-16 ENCOUNTER — Ambulatory Visit (HOSPITAL_BASED_OUTPATIENT_CLINIC_OR_DEPARTMENT_OTHER)
Admission: RE | Admit: 2018-08-16 | Discharge: 2018-08-16 | Disposition: A | Discharge status: Home / Self Care | Payer: Medicare Other | Source: Ambulatory Visit | Attending: Urology | Admitting: Urology

## 2018-08-16 ENCOUNTER — Encounter (HOSPITAL_BASED_OUTPATIENT_CLINIC_OR_DEPARTMENT_OTHER): Payer: Self-pay | Admitting: *Deleted

## 2018-08-16 ENCOUNTER — Ambulatory Visit (HOSPITAL_BASED_OUTPATIENT_CLINIC_OR_DEPARTMENT_OTHER): Payer: Medicare Other | Admitting: Certified Registered Nurse Anesthetist

## 2018-08-16 ENCOUNTER — Encounter (HOSPITAL_BASED_OUTPATIENT_CLINIC_OR_DEPARTMENT_OTHER)
Admission: RE | Disposition: A | Discharge status: Home / Self Care | Payer: Self-pay | Source: Ambulatory Visit | Attending: Urology

## 2018-08-16 ENCOUNTER — Other Ambulatory Visit: Payer: Self-pay

## 2018-08-16 DIAGNOSIS — F17210 Nicotine dependence, cigarettes, uncomplicated: Secondary | ICD-10-CM | POA: Insufficient documentation

## 2018-08-16 DIAGNOSIS — C673 Malignant neoplasm of anterior wall of bladder: Secondary | ICD-10-CM | POA: Insufficient documentation

## 2018-08-16 DIAGNOSIS — E7800 Pure hypercholesterolemia, unspecified: Secondary | ICD-10-CM | POA: Insufficient documentation

## 2018-08-16 DIAGNOSIS — C674 Malignant neoplasm of posterior wall of bladder: Secondary | ICD-10-CM | POA: Diagnosis not present

## 2018-08-16 DIAGNOSIS — Z79899 Other long term (current) drug therapy: Secondary | ICD-10-CM | POA: Insufficient documentation

## 2018-08-16 DIAGNOSIS — C672 Malignant neoplasm of lateral wall of bladder: Secondary | ICD-10-CM | POA: Diagnosis not present

## 2018-08-16 DIAGNOSIS — R310 Gross hematuria: Secondary | ICD-10-CM | POA: Diagnosis not present

## 2018-08-16 DIAGNOSIS — Z881 Allergy status to other antibiotic agents status: Secondary | ICD-10-CM | POA: Insufficient documentation

## 2018-08-16 DIAGNOSIS — I10 Essential (primary) hypertension: Secondary | ICD-10-CM | POA: Insufficient documentation

## 2018-08-16 HISTORY — PX: TRANSURETHRAL RESECTION OF BLADDER TUMOR WITH MITOMYCIN-C: SHX6459

## 2018-08-16 HISTORY — DX: Pain in unspecified knee: M25.569

## 2018-08-16 LAB — POCT I-STAT 4, (NA,K, GLUC, HGB,HCT)
Glucose, Bld: 87 mg/dL (ref 70–99)
HCT: 49 % — ABNORMAL HIGH (ref 36.0–46.0)
Hemoglobin: 16.7 g/dL — ABNORMAL HIGH (ref 12.0–15.0)
Potassium: 3.6 mmol/L (ref 3.5–5.1)
Sodium: 140 mmol/L (ref 135–145)

## 2018-08-16 SURGERY — TRANSURETHRAL RESECTION OF BLADDER TUMOR WITH MITOMYCIN-C
Anesthesia: General | Site: Bladder

## 2018-08-16 MED ORDER — CEFAZOLIN SODIUM 1 G IJ SOLR
INTRAMUSCULAR | Status: AC
  Filled 2018-08-16: qty 20

## 2018-08-16 MED ORDER — FENTANYL CITRATE (PF) 100 MCG/2ML IJ SOLN
25.0000 ug | INTRAMUSCULAR | Status: DC | PRN
  Filled 2018-08-16: qty 1

## 2018-08-16 MED ORDER — SODIUM CHLORIDE 0.9 % IR SOLN
Status: DC | PRN
  Administered 2018-08-16: 15000 mL via INTRAVESICAL

## 2018-08-16 MED ORDER — OXYCODONE HCL 5 MG PO TABS
5.0000 mg | ORAL_TABLET | Freq: Once | ORAL | Status: DC | PRN
  Filled 2018-08-16: qty 1

## 2018-08-16 MED ORDER — PROPOFOL 10 MG/ML IV BOLUS
INTRAVENOUS | Status: AC
  Filled 2018-08-16: qty 20

## 2018-08-16 MED ORDER — FENTANYL CITRATE (PF) 100 MCG/2ML IJ SOLN
INTRAMUSCULAR | Status: DC | PRN
  Administered 2018-08-16 (×2): 50 ug via INTRAVENOUS

## 2018-08-16 MED ORDER — LACTATED RINGERS IV SOLN
INTRAVENOUS | Status: DC
  Administered 2018-08-16: 13:00:00 via INTRAVENOUS
  Filled 2018-08-16: qty 1000

## 2018-08-16 MED ORDER — HYDROCODONE-ACETAMINOPHEN 5-325 MG PO TABS: 1.0000 | ORAL_TABLET | ORAL | 0 refills | Status: DC | PRN

## 2018-08-16 MED ORDER — CEFAZOLIN SODIUM-DEXTROSE 2-4 GM/100ML-% IV SOLN
2.0000 g | INTRAVENOUS | Status: AC
  Administered 2018-08-16: 2 g via INTRAVENOUS
  Filled 2018-08-16: qty 100

## 2018-08-16 MED ORDER — SUGAMMADEX SODIUM 200 MG/2ML IV SOLN
INTRAVENOUS | Status: AC
  Filled 2018-08-16: qty 2

## 2018-08-16 MED ORDER — OXYCODONE HCL 5 MG/5ML PO SOLN
5.0000 mg | Freq: Once | ORAL | Status: DC | PRN
  Filled 2018-08-16: qty 5

## 2018-08-16 MED ORDER — LIDOCAINE 2% (20 MG/ML) 5 ML SYRINGE
INTRAMUSCULAR | Status: AC
  Filled 2018-08-16: qty 5

## 2018-08-16 MED ORDER — SUGAMMADEX SODIUM 200 MG/2ML IV SOLN
INTRAVENOUS | Status: DC | PRN
  Administered 2018-08-16: 120 mg via INTRAVENOUS

## 2018-08-16 MED ORDER — ROCURONIUM BROMIDE 100 MG/10ML IV SOLN
INTRAVENOUS | Status: DC | PRN
  Administered 2018-08-16: 30 mg via INTRAVENOUS

## 2018-08-16 MED ORDER — LIDOCAINE HCL (CARDIAC) PF 100 MG/5ML IV SOSY
PREFILLED_SYRINGE | INTRAVENOUS | Status: DC | PRN
  Administered 2018-08-16: 50 mg via INTRAVENOUS

## 2018-08-16 MED ORDER — DEXAMETHASONE SODIUM PHOSPHATE 10 MG/ML IJ SOLN
INTRAMUSCULAR | Status: AC
  Filled 2018-08-16: qty 1

## 2018-08-16 MED ORDER — PROPOFOL 10 MG/ML IV BOLUS
INTRAVENOUS | Status: DC | PRN
  Administered 2018-08-16: 50 mg via INTRAVENOUS
  Administered 2018-08-16: 100 mg via INTRAVENOUS

## 2018-08-16 MED ORDER — ONDANSETRON HCL 4 MG/2ML IJ SOLN
INTRAMUSCULAR | Status: AC
  Filled 2018-08-16: qty 2

## 2018-08-16 MED ORDER — ONDANSETRON HCL 4 MG/2ML IJ SOLN
4.0000 mg | Freq: Once | INTRAMUSCULAR | Status: DC | PRN
  Filled 2018-08-16: qty 2

## 2018-08-16 MED ORDER — FENTANYL CITRATE (PF) 100 MCG/2ML IJ SOLN
INTRAMUSCULAR | Status: AC
  Filled 2018-08-16: qty 2

## 2018-08-16 MED ORDER — ONDANSETRON HCL 4 MG/2ML IJ SOLN
INTRAMUSCULAR | Status: DC | PRN
  Administered 2018-08-16: 4 mg via INTRAVENOUS

## 2018-08-16 MED ORDER — DEXAMETHASONE SODIUM PHOSPHATE 10 MG/ML IJ SOLN
INTRAMUSCULAR | Status: DC | PRN
  Administered 2018-08-16: 10 mg via INTRAVENOUS

## 2018-08-16 SURGICAL SUPPLY — 21 items
BAG DRAIN URO-CYSTO SKYTR STRL (DRAIN) ×2 IMPLANT
BAG URINE DRAINAGE (UROLOGICAL SUPPLIES) IMPLANT
BAG URINE LEG 19OZ MD ST LTX (BAG) IMPLANT
BAG URINE LEG 500ML (DRAIN) IMPLANT
CATH FOLEY 2WAY SLVR  5CC 22FR (CATHETERS)
CATH FOLEY 2WAY SLVR 30CC 20FR (CATHETERS) IMPLANT
CATH FOLEY 2WAY SLVR 5CC 22FR (CATHETERS) IMPLANT
CLOTH BEACON ORANGE TIMEOUT ST (SAFETY) ×2 IMPLANT
ELECT REM PT RETURN 9FT ADLT (ELECTROSURGICAL)
ELECTRODE REM PT RTRN 9FT ADLT (ELECTROSURGICAL) IMPLANT
EVACUATOR MICROVAS BLADDER (UROLOGICAL SUPPLIES) IMPLANT
GLOVE BIO SURGEON STRL SZ7.5 (GLOVE) ×2 IMPLANT
GOWN STRL REUS W/ TWL LRG LVL3 (GOWN DISPOSABLE) ×1 IMPLANT
GOWN STRL REUS W/TWL LRG LVL3 (GOWN DISPOSABLE) ×1
IV NS IRRIG 3000ML ARTHROMATIC (IV SOLUTION) ×10 IMPLANT
KIT TURNOVER CYSTO (KITS) ×2 IMPLANT
LOOP CUT BIPOLAR 24F LRG (ELECTROSURGICAL) ×2 IMPLANT
MANIFOLD NEPTUNE II (INSTRUMENTS) IMPLANT
PACK CYSTO (CUSTOM PROCEDURE TRAY) ×2 IMPLANT
SYRINGE IRR TOOMEY STRL 70CC (SYRINGE) ×2 IMPLANT
TUBE CONNECTING 12X1/4 (SUCTIONS) IMPLANT

## 2018-08-16 NOTE — Op Note (Signed)
Operative Note  Preoperative diagnosis:  1.  Bladder tumor  Postoperative diagnosis: 1.  Bladder tumor--large, multifocal  Procedure(s): 1.  Transurethral resection of bladder tumor--large  Surgeon: Link Snuffer, MD  Assistants: None  Anesthesia: General  Complications: None immediate  EBL: Minimal  Specimens: 1.  Bladder tumor  Drains/Catheters: 1.  None  Intraoperative findings: 1.  Normal urethra 2.  Bilateral ureteral orifices were normal.  Resection was well away from these. 3.  3 bladder tumors.  1 cm tumor on the posterior bladder wall, 2 cm tumor on the left lateral wall, 5 cm tumor on the anterior bladder wall.  All appeared to be completely resected.  There was some resection down to bladder muscle fibers  Indication: 77 year old female found to have bladder tumor on hematuria work-up presents for the previously mentioned operation  Description of procedure:  The patient was identified and consent was obtained.  The patient was taken to the operating room and placed in the supine position.  The patient was placed under general anesthesia.  Perioperative antibiotics were administered.  The patient was placed in dorsal lithotomy.  Patient was prepped and draped in a standard sterile fashion and a timeout was performed.  A 26 French resectoscope with the blunt obturator in place was advanced into the urethra and into the bladder.  This was exchanged for the resectoscope.  On bipolar settings, all bladder tumors were completely resected.  Given the extensive resection and the potential for invasiveness, I decided not to place gemcitabine.  All the bladder tumor bed was fulgurated as well as the surrounding area.  Resection was well away from the ureteral orifice.  There is no evidence of overt bladder perforation.  All bladder specimens were collected for permanent pathology.  The bladder mucosa was reinspected and there was no active bleeding noted.  No other bladder tumors  were seen.  Bladder was drained and the scope withdrawn.  The patient tolerated the procedure well and was stable postoperatively.  Plan: Return in 1 week for pathology review.

## 2018-08-16 NOTE — H&P (Signed)
CC/HPI: Cc: Bladder tumors  HPI:  08/09/2018  Referral by Dr. Matilde Sprang. Patient underwent a hematuria workup. CT scan revealed a bladder tumor. No upper tract abnormalities. This was performed with an without contrast with delayed images. The patient continues to have intermittent gross hematuria. Cystoscopy by the previously mentioned position was positive for multiple bladder tumors up to 2 cm in size. These apparently appeared to be superficial. Patient currently has no complaints today. She presents to discuss options for treatment.     ALLERGIES: Doxycycline - Tachycardia    MEDICATIONS: Lisinopril  Klor-Con      GU PSH: Cystoscopy - 07/17/2018 Locm 300-399Mg /Ml Iodine,1Ml - 07/12/2018     NON-GU PSH: Breast Biopsy Tonsillectomy - 1960          GU PMH: Gross hematuria - 07/11/2018 Nocturia - 07/11/2018 Urinary Frequency - 07/11/2018     NON-GU PMH: Arthritis Encounter for general adult medical examination without abnormal findings, Encounter for preventive health examination Hypercholesterolemia Hypertension     FAMILY HISTORY: Diabetes - Sister, Mother father deceased - Other mother deceased - Other    SOCIAL HISTORY: Marital Status: Divorced Current Smoking Status: Patient smokes. Has smoked since 06/16/1968. Smokes 1 pack per day.  <DIV'  Tobacco Use Assessment Completed:  Used Tobacco in last 30 days?   Drinks 2 drinks per day.  Drinks 1 caffeinated drink per day.     REVIEW OF SYSTEMS:      GU Review Female:  Patient denies frequent urination, hard to postpone urination, burning /pain with urination, get up at night to urinate, leakage of urine, stream starts and stops, trouble starting your stream, have to strain to urinate, and being pregnant.     Gastrointestinal (Upper):  Patient denies nausea, vomiting, and indigestion/ heartburn.     Gastrointestinal (Lower):  Patient denies diarrhea and constipation.     Constitutional:  Patient denies fever, night sweats,  weight loss, and fatigue.     Skin:  Patient denies skin rash/ lesion and itching.     Eyes:  Patient denies blurred vision and double vision.     Ears/ Nose/ Throat:  Patient denies sore throat and sinus problems.     Hematologic/Lymphatic:  Patient denies swollen glands and easy bruising.     Cardiovascular:  Patient denies leg swelling and chest pains.     Respiratory:  Patient denies cough and shortness of breath.     Endocrine:  Patient denies excessive thirst.     Musculoskeletal:  Patient denies back pain and joint pain.     Neurological:  Patient denies headaches and dizziness.     Psychologic:  Patient denies depression and anxiety.     VITAL SIGNS:        08/09/2018 10:33 AM      BP 159/84 mmHg      Heart Rate 109 /min      Temperature 97.2 F / 36.2 C      MULTI-SYSTEM PHYSICAL EXAMINATION:       Constitutional: Well-nourished. No physical deformities. Normally developed. Good grooming.      Respiratory: No labored breathing, no use of accessory muscles.       Cardiovascular: Normal temperature, adequate perfusion of extremities      Skin: No paleness, no jaundice      Neurologic / Psychiatric: Oriented to time, oriented to place, oriented to person. No depression, no anxiety, no agitation.      Gastrointestinal: No mass, no tenderness, no rigidity, non obese abdomen.  Eyes: Normal conjunctivae. Normal eyelids.      Musculoskeletal: Normal gait and station of head and neck.              PAST DATA REVIEWED:   Source Of History:  Patient  X-Ray Review: C.T. Abdomen/Pelvis: Reviewed Films. Reviewed Report. Discussed With Patient.     PROCEDURES:    Urinalysis w/Scope  Dipstick Dipstick Cont'd Micro  Color: Yellow Bilirubin: Neg mg/dL WBC/hpf: 6 - 10/hpf  Appearance: Cloudy Ketones: Neg mg/dL RBC/hpf: 3 - 10/hpf  Specific Gravity: 1.015 Blood: 2+ ery/uL Bacteria: Mod (26-50/hpf)  pH: 6.5 Protein: Trace mg/dL Cystals: NS (Not Seen)  Glucose: Neg mg/dL Urobilinogen:  0.2 mg/dL Casts: NS (Not Seen)   Nitrites: Neg Trichomonas: Not Present   Leukocyte Esterase: Neg leu/uL Mucous: Not Present    Epithelial Cells: 6 - 10/hpf    Yeast: NS (Not Seen)    Sperm: Not Present    ASSESSMENT:     ICD-10 Details  1 GU:  Bladder tumor/neoplasm - D41.4   2  Gross hematuria - R31.0    PLAN:   Orders  Labs Urine Culture  Document  Letter(s):  Created for Patient: Clinical Summary   Notes:  Proceed with transurethral resection of bladder tumor. She understands potential risks including but not limited to bleeding, infection, injury to surrounding structures including potential for bladder perforation which would require intraoperative repair or prolonged catheterization. This is an uncommon complication but possible.   CC: Royetta Car, M.D.  [name], M.D.

## 2018-08-16 NOTE — Anesthesia Procedure Notes (Signed)
Procedure Name: Intubation Date/Time: 08/16/2018 3:13 PM Performed by: Raenette Rover, CRNA Pre-anesthesia Checklist: Patient identified, Emergency Drugs available, Suction available and Patient being monitored Patient Re-evaluated:Patient Re-evaluated prior to induction Oxygen Delivery Method: Circle system utilized Preoxygenation: Pre-oxygenation with 100% oxygen Induction Type: IV induction Ventilation: Mask ventilation without difficulty and Oral airway inserted - appropriate to patient size Laryngoscope Size: Mac and 3 Grade View: Grade I Tube type: Oral Tube size: 7.0 mm Number of attempts: 1 Airway Equipment and Method: Stylet Placement Confirmation: ETT inserted through vocal cords under direct vision,  positive ETCO2,  CO2 detector and breath sounds checked- equal and bilateral Secured at: 21 cm Tube secured with: Tape Dental Injury: Teeth and Oropharynx as per pre-operative assessment

## 2018-08-16 NOTE — Transfer of Care (Signed)
Immediate Anesthesia Transfer of Care Note  Patient: Tracy Davidson  Procedure(s) Performed: TRANSURETHRAL RESECTION OF BLADDER TUMOR (N/A Bladder)  Patient Location: PACU  Anesthesia Type:General  Level of Consciousness: awake, alert , oriented and patient cooperative  Airway & Oxygen Therapy: Patient Spontanous Breathing and Patient connected to face mask oxygen  Post-op Assessment: Report given to RN and Post -op Vital signs reviewed and stable  Post vital signs: Reviewed and stable  Last Vitals:  Vitals Value Taken Time  BP 183/91 08/16/2018  4:08 PM  Temp    Pulse 87 08/16/2018  4:10 PM  Resp 22 08/16/2018  4:10 PM  SpO2 100 % 08/16/2018  4:10 PM  Vitals shown include unvalidated device data.  Last Pain:  Vitals:   08/16/18 1244  TempSrc:   PainSc: 0-No pain      Patients Stated Pain Goal: 7 ([id number])  Complications: No apparent anesthesia complications

## 2018-08-16 NOTE — Discharge Instructions (Addendum)
Transurethral Resection of Bladder Tumor (TURBT) or Bladder Biopsy   Definition:  Transurethral Resection of the Bladder Tumor is a surgical procedure used to diagnose and remove tumors within the bladder. TURBT is the most common treatment for early stage bladder cancer.  General instructions:     Your recent bladder surgery requires very little post hospital care but some definite precautions.  Despite the fact that no skin incisions were used, the area around the bladder incisions are raw and covered with scabs to promote healing and prevent bleeding. Certain precautions are needed to insure that the scabs are not disturbed over the next 2-4 weeks while the healing proceeds.  Because the raw surface inside your bladder and the irritating effects of urine you may expect frequency of urination and/or urgency (a stronger desire to urinate) and perhaps even getting up at night more often. This will usually resolve or improve slowly over the healing period. You may see some blood in your urine over the first 6 weeks. Do not be alarmed, even if the urine was clear for a while. Get off your feet and drink lots of fluids until clearing occurs. If you start to pass clots or don't improve call us.  Diet:  You may return to your normal diet immediately. Because of the raw surface of your bladder, alcohol, spicy foods, foods high in acid and drinks with caffeine may cause irritation or frequency and should be used in moderation. To keep your urine flowing freely and avoid constipation, drink plenty of fluids during the day (8-10 glasses). Tip: Avoid cranberry juice because it is very acidic.  Activity:  Your physical activity doesn't need to be restricted. However, if you are very active, you may see some blood in the urine. We suggest that you reduce your activity under the circumstances until the bleeding has stopped.  Bowels:  It is important to keep your bowels regular during the postoperative  period. Straining with bowel movements can cause bleeding. A bowel movement every other day is reasonable. Use a mild laxative if needed, such as milk of magnesia 2-3 tablespoons, or 2 Dulcolax tablets. Call if you continue to have problems. If you had been taking narcotics for pain, before, during or after your surgery, you may be constipated. Take a laxative if necessary.    Medication:  You should resume your pre-surgery medications unless told not to. In addition you may be given an antibiotic to prevent or treat infection. Antibiotics are not always necessary. All medication should be taken as prescribed until the bottles are finished unless you are having an unusual reaction to one of the drugs.   Post Anesthesia Home Care Instructions  Activity: Get plenty of rest for the remainder of the day. A responsible individual must stay with you for 24 hours following the procedure.  For the next 24 hours, DO NOT: -Drive a car -Operate machinery -Drink alcoholic beverages -Take any medication unless instructed by your physician -Make any legal decisions or sign important papers.  Meals: Start with liquid foods such as gelatin or soup. Progress to regular foods as tolerated. Avoid greasy, spicy, heavy foods. If nausea and/or vomiting occur, drink only clear liquids until the nausea and/or vomiting subsides. Call your physician if vomiting continues.  Special Instructions/Symptoms: Your throat may feel dry or sore from the anesthesia or the breathing tube placed in your throat during surgery. If this causes discomfort, gargle with warm salt water. The discomfort should disappear within 24 hours.       

## 2018-08-16 NOTE — Anesthesia Preprocedure Evaluation (Addendum)
Anesthesia Evaluation  Patient identified by MRN, date of birth, ID band Patient awake    Reviewed: Allergy & Precautions, NPO status , Patient's Chart, lab work & pertinent test results  History of Anesthesia Complications Negative for: history of anesthetic complications  Airway Mallampati: II  TM Distance: >3 FB Neck ROM: Full    Dental no notable dental hx.    Pulmonary Current Smoker,     + decreased breath sounds      Cardiovascular hypertension, Pt. on medications Normal cardiovascular exam     Neuro/Psych negative neurological ROS  negative psych ROS   GI/Hepatic negative GI ROS, Neg liver ROS,   Endo/Other  negative endocrine ROS  Renal/GU negative Renal ROS  negative genitourinary   Musculoskeletal negative musculoskeletal ROS (+)   Abdominal   Peds  Hematology negative hematology ROS (+)   Anesthesia Other Findings   Reproductive/Obstetrics                            Anesthesia Physical Anesthesia Plan  ASA: III  Anesthesia Plan: General   Post-op Pain Management:    Induction: Intravenous  PONV Risk Score and Plan: 3 and Ondansetron, Dexamethasone and Treatment may vary due to age or medical condition  Airway Management Planned: LMA  Additional Equipment: None  Intra-op Plan:   Post-operative Plan: Extubation in OR  Informed Consent: I have reviewed the patients History and Physical, chart, labs and discussed the procedure including the risks, benefits and alternatives for the proposed anesthesia with the patient or authorized representative who has indicated his/her understanding and acceptance.     Plan Discussed with:   Anesthesia Plan Comments:        Anesthesia Quick Evaluation

## 2018-08-19 ENCOUNTER — Encounter (HOSPITAL_BASED_OUTPATIENT_CLINIC_OR_DEPARTMENT_OTHER): Payer: Self-pay | Admitting: Urology

## 2018-08-19 ENCOUNTER — Other Ambulatory Visit: Payer: Self-pay | Admitting: Family Medicine

## 2018-08-19 NOTE — Anesthesia Postprocedure Evaluation (Signed)
Anesthesia Post Note  Patient: Tracy Davidson  Procedure(s) Performed: TRANSURETHRAL RESECTION OF BLADDER TUMOR (N/A Bladder)     Patient location during evaluation: PACU Anesthesia Type: General Level of consciousness: awake and alert Pain management: pain level controlled Vital Signs Assessment: post-procedure vital signs reviewed and stable Respiratory status: spontaneous breathing, nonlabored ventilation, respiratory function stable and patient connected to nasal cannula oxygen Cardiovascular status: blood pressure returned to baseline and stable Postop Assessment: no apparent nausea or vomiting Anesthetic complications: no    Last Vitals:  Vitals:   08/16/18 1630 08/16/18 1715  BP: (!) 163/115 (!) 178/86  Pulse: 94 85  Resp: 16 14  Temp:  37.3 C  SpO2: 99% 99%    Last Pain:  Vitals:   08/16/18 1715  TempSrc:   PainSc: 0-No pain                 Lidia Collum

## 2018-08-20 NOTE — Telephone Encounter (Signed)
Rx already sent to pharmacy for refills

## 2018-08-30 ENCOUNTER — Other Ambulatory Visit: Payer: Self-pay | Admitting: Urology

## 2018-09-10 ENCOUNTER — Encounter (HOSPITAL_BASED_OUTPATIENT_CLINIC_OR_DEPARTMENT_OTHER): Payer: Self-pay | Admitting: *Deleted

## 2018-09-10 ENCOUNTER — Other Ambulatory Visit: Payer: Self-pay

## 2018-09-10 NOTE — Progress Notes (Signed)
Spoke with Glada  npo after midnight, arrive 900 am 09-18-18 [facility] No meds to take  Records on chart/epic ekg 08-16-18 Needs I stat 4 Friend katherine carter driver

## 2018-09-11 ENCOUNTER — Other Ambulatory Visit: Payer: Self-pay | Admitting: Internal Medicine

## 2018-09-11 ENCOUNTER — Other Ambulatory Visit: Payer: Self-pay | Admitting: Family Medicine

## 2018-09-11 DIAGNOSIS — Z1231 Encounter for screening mammogram for malignant neoplasm of breast: Secondary | ICD-10-CM

## 2018-09-18 ENCOUNTER — Ambulatory Visit (HOSPITAL_BASED_OUTPATIENT_CLINIC_OR_DEPARTMENT_OTHER)
Admission: RE | Admit: 2018-09-18 | Discharge: 2018-09-18 | Disposition: A | Discharge status: Home / Self Care | Payer: Medicare Other | Source: Ambulatory Visit | Attending: Urology | Admitting: Urology

## 2018-09-18 ENCOUNTER — Encounter (HOSPITAL_BASED_OUTPATIENT_CLINIC_OR_DEPARTMENT_OTHER): Payer: Self-pay | Admitting: Certified Registered"

## 2018-09-18 ENCOUNTER — Encounter (HOSPITAL_BASED_OUTPATIENT_CLINIC_OR_DEPARTMENT_OTHER)
Admission: RE | Disposition: A | Discharge status: Home / Self Care | Payer: Self-pay | Source: Ambulatory Visit | Attending: Urology

## 2018-09-18 ENCOUNTER — Ambulatory Visit (HOSPITAL_BASED_OUTPATIENT_CLINIC_OR_DEPARTMENT_OTHER): Payer: Medicare Other | Admitting: Certified Registered"

## 2018-09-18 DIAGNOSIS — C679 Malignant neoplasm of bladder, unspecified: Secondary | ICD-10-CM | POA: Diagnosis not present

## 2018-09-18 DIAGNOSIS — F17210 Nicotine dependence, cigarettes, uncomplicated: Secondary | ICD-10-CM | POA: Diagnosis not present

## 2018-09-18 DIAGNOSIS — I10 Essential (primary) hypertension: Secondary | ICD-10-CM | POA: Insufficient documentation

## 2018-09-18 DIAGNOSIS — Z881 Allergy status to other antibiotic agents status: Secondary | ICD-10-CM | POA: Insufficient documentation

## 2018-09-18 DIAGNOSIS — Z79899 Other long term (current) drug therapy: Secondary | ICD-10-CM | POA: Insufficient documentation

## 2018-09-18 DIAGNOSIS — E7800 Pure hypercholesterolemia, unspecified: Secondary | ICD-10-CM | POA: Insufficient documentation

## 2018-09-18 HISTORY — PX: TRANSURETHRAL RESECTION OF BLADDER TUMOR: SHX2575

## 2018-09-18 LAB — POCT I-STAT 4, (NA,K, GLUC, HGB,HCT)
Glucose, Bld: 102 mg/dL — ABNORMAL HIGH (ref 70–99)
HEMATOCRIT: 48 % — AB (ref 36.0–46.0)
Hemoglobin: 16.3 g/dL — ABNORMAL HIGH (ref 12.0–15.0)
POTASSIUM: 3.4 mmol/L — AB (ref 3.5–5.1)
SODIUM: 141 mmol/L (ref 135–145)

## 2018-09-18 SURGERY — TURBT (TRANSURETHRAL RESECTION OF BLADDER TUMOR)
Anesthesia: General | Site: Bladder

## 2018-09-18 MED ORDER — ONDANSETRON HCL 4 MG/2ML IJ SOLN
4.0000 mg | Freq: Four times a day (QID) | INTRAMUSCULAR | Status: DC | PRN
  Filled 2018-09-18: qty 2

## 2018-09-18 MED ORDER — PROPOFOL 10 MG/ML IV BOLUS
INTRAVENOUS | Status: AC
  Filled 2018-09-18: qty 20

## 2018-09-18 MED ORDER — SUGAMMADEX SODIUM 200 MG/2ML IV SOLN
INTRAVENOUS | Status: AC
  Filled 2018-09-18: qty 2

## 2018-09-18 MED ORDER — DEXAMETHASONE SODIUM PHOSPHATE 10 MG/ML IJ SOLN
INTRAMUSCULAR | Status: AC
  Filled 2018-09-18: qty 1

## 2018-09-18 MED ORDER — OXYCODONE HCL 5 MG PO TABS
5.0000 mg | ORAL_TABLET | Freq: Once | ORAL | Status: DC | PRN
  Filled 2018-09-18: qty 1

## 2018-09-18 MED ORDER — FENTANYL CITRATE (PF) 100 MCG/2ML IJ SOLN
INTRAMUSCULAR | Status: AC
  Filled 2018-09-18: qty 2

## 2018-09-18 MED ORDER — LACTATED RINGERS IV SOLN
INTRAVENOUS | Status: DC
  Administered 2018-09-18: 09:00:00 via INTRAVENOUS
  Filled 2018-09-18: qty 1000

## 2018-09-18 MED ORDER — SUGAMMADEX SODIUM 200 MG/2ML IV SOLN
INTRAVENOUS | Status: DC | PRN
  Administered 2018-09-18: 115 mg via INTRAVENOUS

## 2018-09-18 MED ORDER — LIDOCAINE 2% (20 MG/ML) 5 ML SYRINGE
INTRAMUSCULAR | Status: DC | PRN
  Administered 2018-09-18: 50 mg via INTRAVENOUS

## 2018-09-18 MED ORDER — FENTANYL CITRATE (PF) 100 MCG/2ML IJ SOLN
INTRAMUSCULAR | Status: DC | PRN
  Administered 2018-09-18 (×2): 25 ug via INTRAVENOUS
  Administered 2018-09-18: 50 ug via INTRAVENOUS

## 2018-09-18 MED ORDER — LIDOCAINE 2% (20 MG/ML) 5 ML SYRINGE
INTRAMUSCULAR | Status: AC
  Filled 2018-09-18: qty 5

## 2018-09-18 MED ORDER — CEFAZOLIN SODIUM-DEXTROSE 2-4 GM/100ML-% IV SOLN
INTRAVENOUS | Status: AC
  Filled 2018-09-18: qty 100

## 2018-09-18 MED ORDER — PROPOFOL 10 MG/ML IV BOLUS
INTRAVENOUS | Status: DC | PRN
  Administered 2018-09-18: 120 mg via INTRAVENOUS

## 2018-09-18 MED ORDER — FENTANYL CITRATE (PF) 100 MCG/2ML IJ SOLN
25.0000 ug | INTRAMUSCULAR | Status: DC | PRN
  Filled 2018-09-18: qty 1

## 2018-09-18 MED ORDER — ROCURONIUM BROMIDE 10 MG/ML (PF) SYRINGE
PREFILLED_SYRINGE | INTRAVENOUS | Status: AC
  Filled 2018-09-18: qty 10

## 2018-09-18 MED ORDER — ONDANSETRON HCL 4 MG/2ML IJ SOLN
INTRAMUSCULAR | Status: AC
  Filled 2018-09-18: qty 2

## 2018-09-18 MED ORDER — DEXAMETHASONE SODIUM PHOSPHATE 10 MG/ML IJ SOLN
INTRAMUSCULAR | Status: DC | PRN
  Administered 2018-09-18: 4 mg via INTRAVENOUS

## 2018-09-18 MED ORDER — OXYCODONE HCL 5 MG/5ML PO SOLN
5.0000 mg | Freq: Once | ORAL | Status: DC | PRN
  Filled 2018-09-18: qty 5

## 2018-09-18 MED ORDER — CEFAZOLIN SODIUM-DEXTROSE 2-4 GM/100ML-% IV SOLN
2.0000 g | INTRAVENOUS | Status: AC
  Administered 2018-09-18: 2 g via INTRAVENOUS
  Filled 2018-09-18: qty 100

## 2018-09-18 MED ORDER — ONDANSETRON HCL 4 MG/2ML IJ SOLN
INTRAMUSCULAR | Status: DC | PRN
  Administered 2018-09-18: 4 mg via INTRAVENOUS

## 2018-09-18 MED ORDER — ROCURONIUM BROMIDE 10 MG/ML (PF) SYRINGE
PREFILLED_SYRINGE | INTRAVENOUS | Status: DC | PRN
  Administered 2018-09-18: 40 mg via INTRAVENOUS

## 2018-09-18 SURGICAL SUPPLY — 21 items
BAG DRAIN URO-CYSTO SKYTR STRL (DRAIN) ×2 IMPLANT
BAG URINE DRAINAGE (UROLOGICAL SUPPLIES) IMPLANT
BAG URINE LEG 19OZ MD ST LTX (BAG) IMPLANT
BAG URINE LEG 500ML (DRAIN) IMPLANT
CATH FOLEY 2WAY SLVR  5CC 22FR (CATHETERS)
CATH FOLEY 2WAY SLVR 30CC 20FR (CATHETERS) IMPLANT
CATH FOLEY 2WAY SLVR 5CC 22FR (CATHETERS) IMPLANT
CLOTH BEACON ORANGE TIMEOUT ST (SAFETY) ×2 IMPLANT
ELECT REM PT RETURN 9FT ADLT (ELECTROSURGICAL)
ELECTRODE REM PT RTRN 9FT ADLT (ELECTROSURGICAL) IMPLANT
EVACUATOR MICROVAS BLADDER (UROLOGICAL SUPPLIES) IMPLANT
GLOVE BIO SURGEON STRL SZ7.5 (GLOVE) ×2 IMPLANT
GOWN STRL REUS W/ TWL LRG LVL3 (GOWN DISPOSABLE) ×1 IMPLANT
GOWN STRL REUS W/TWL LRG LVL3 (GOWN DISPOSABLE) ×1
IV NS IRRIG 3000ML ARTHROMATIC (IV SOLUTION) ×6 IMPLANT
KIT TURNOVER CYSTO (KITS) ×2 IMPLANT
LOOP CUT BIPOLAR 24F LRG (ELECTROSURGICAL) ×2 IMPLANT
MANIFOLD NEPTUNE II (INSTRUMENTS) ×2 IMPLANT
PACK CYSTO (CUSTOM PROCEDURE TRAY) ×2 IMPLANT
SYRINGE IRR TOOMEY STRL 70CC (SYRINGE) IMPLANT
TUBE CONNECTING 12X1/4 (SUCTIONS) ×2 IMPLANT

## 2018-09-18 NOTE — Anesthesia Postprocedure Evaluation (Signed)
Anesthesia Post Note  Patient: Tracy Davidson  Procedure(s) Performed: RESTAGE TRANSURETHRAL RESECTION OF BLADDER TUMOR (TURBT) (N/A Bladder)     Patient location during evaluation: PACU Anesthesia Type: General Level of consciousness: awake and alert Pain management: pain level controlled Vital Signs Assessment: post-procedure vital signs reviewed and stable Respiratory status: spontaneous breathing, nonlabored ventilation, respiratory function stable and patient connected to nasal cannula oxygen Cardiovascular status: blood pressure returned to baseline and stable Postop Assessment: no apparent nausea or vomiting Anesthetic complications: no    Last Vitals:  Vitals:   09/18/18 1200 09/18/18 1245  BP:  (!) 167/94  Pulse: 80 90  Resp: 16 16  Temp:  36.8 C  SpO2: 100% 100%    Last Pain:  Vitals:   09/18/18 1245  TempSrc:   PainSc: 0-No pain                 [name]

## 2018-09-18 NOTE — Transfer of Care (Signed)
Immediate Anesthesia Transfer of Care Note  Patient: Tracy Davidson  Procedure(s) Performed: Procedure(s) (LRB): RESTAGE TRANSURETHRAL RESECTION OF BLADDER TUMOR (TURBT) (N/A)  Patient Location: PACU  Anesthesia Type: General  Level of Consciousness: awake, oriented, sedated and patient cooperative  Airway & Oxygen Therapy: Patient Spontanous Breathing and Patient connected to face mask oxygen  Post-op Assessment: Report given to PACU RN and Post -op Vital signs reviewed and stable  Post vital signs: Reviewed and stable  Complications: No apparent anesthesia complications Last Vitals:  Vitals Value Taken Time  BP 173/89 09/18/2018 11:19 AM  Temp    Pulse    Resp 21 09/18/2018 11:21 AM  SpO2    Vitals shown include unvalidated device data.  Last Pain:  Vitals:   09/18/18 0900  TempSrc: Oral

## 2018-09-18 NOTE — Anesthesia Preprocedure Evaluation (Signed)
Anesthesia Evaluation  Patient identified by MRN, date of birth, ID band Patient awake    Reviewed: Allergy & Precautions, H&P , NPO status , Patient's Chart, lab work & pertinent test results  Airway Mallampati: II   Neck ROM: full    Dental   Pulmonary Current Smoker,    breath sounds clear to auscultation       Cardiovascular hypertension,  Rhythm:regular Rate:Normal     Neuro/Psych    GI/Hepatic   Endo/Other    Renal/GU    Bladder CA    Musculoskeletal  (+) Arthritis ,   Abdominal   Peds  Hematology   Anesthesia Other Findings   Reproductive/Obstetrics                             Anesthesia Physical Anesthesia Plan  ASA: II  Anesthesia Plan: General   Post-op Pain Management:    Induction: Intravenous  PONV Risk Score and Plan: 2 and Ondansetron, Dexamethasone and Treatment may vary due to age or medical condition  Airway Management Planned: LMA  Additional Equipment:   Intra-op Plan:   Post-operative Plan:   Informed Consent: I have reviewed the patients History and Physical, chart, labs and discussed the procedure including the risks, benefits and alternatives for the proposed anesthesia with the patient or authorized representative who has indicated his/her understanding and acceptance.     Plan Discussed with: CRNA, Anesthesiologist and Surgeon  Anesthesia Plan Comments:         Anesthesia Quick Evaluation

## 2018-09-18 NOTE — H&P (Signed)
CC/HPI: Cc: Bladder tumors  HPI:  08/09/2018  Referral by Dr. Matilde Sprang. Patient underwent a hematuria workup. CT scan revealed a bladder tumor. No upper tract abnormalities. This was performed with an without contrast with delayed images. The patient continues to have intermittent gross hematuria. Cystoscopy by the previously mentioned position was positive for multiple bladder tumors up to 2 cm in size. These apparently appeared to be superficial. Patient currently has no complaints today. She presents to discuss options for treatment.   08/29/2018:  Patient returns after undergoing a TURBT-large about 1 week ago. 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 Invasion was below muscularis mucosa. There is no muscularis propria present. The patient has been doing well. Denies gross hematuria. CT staging was negative for metastatic disease.     ALLERGIES: Doxycycline - Tachycardia    MEDICATIONS: Lisinopril  Klor-Con     GU PSH: Cystoscopy - 07/17/2018 Cystoscopy TURBT >5 cm - 08/16/2018 Locm 300-399Mg /Ml Iodine,1Ml - 07/12/2018    NON-GU PSH: Breast Biopsy Tonsillectomy - 1960    GU PMH: Bladder tumor/neoplasm - 08/09/2018 Gross hematuria - 07/11/2018 Nocturia - 07/11/2018 Urinary Frequency - 07/11/2018    NON-GU PMH: Arthritis Encounter for general adult medical examination without abnormal findings, Encounter for preventive health examination Hypercholesterolemia Hypertension    FAMILY HISTORY: Diabetes - Sister, Mother father deceased - Other mother deceased - Other   SOCIAL HISTORY: Marital Status: Divorced Current Smoking Status: Patient smokes. Has smoked since 06/16/1968. Smokes 1 pack per day.   Tobacco Use Assessment Completed: Used Tobacco in last 30 days? Drinks 2 drinks per day.  Drinks 1 caffeinated drink per day.    REVIEW OF SYSTEMS:    GU Review Female:   Patient denies frequent  urination, hard to postpone urination, burning /pain with urination, get up at night to urinate, leakage of urine, stream starts and stops, trouble starting your stream, have to strain to urinate, and being pregnant.  Gastrointestinal (Upper):   Patient denies indigestion/ heartburn, vomiting, and nausea.  Gastrointestinal (Lower):   Patient denies diarrhea and constipation.  Constitutional:   Patient denies fever, night sweats, weight loss, and fatigue.  Skin:   Patient denies skin rash/ lesion and itching.  Eyes:   Patient denies blurred vision and double vision.  Ears/ Nose/ Throat:   Patient denies sore throat and sinus problems.  Hematologic/Lymphatic:   Patient denies swollen glands and easy bruising.  Cardiovascular:   Patient denies leg swelling and chest pains.  Respiratory:   Patient denies cough and shortness of breath.  Endocrine:   Patient denies excessive thirst.  Musculoskeletal:   Patient denies back pain and joint pain.  Neurological:   Patient denies headaches and dizziness.  Psychologic:   Patient denies depression and anxiety.   VITAL SIGNS:      08/29/2018 10:30 AM  BP 147/79 mmHg  Pulse 100 /min  Temperature 98.2 F / 36.7 C   MULTI-SYSTEM PHYSICAL EXAMINATION:    Constitutional: Well-nourished. No physical deformities. Normally developed. Good grooming.  Respiratory: No labored breathing, no use of accessory muscles.   Cardiovascular: Normal temperature, adequate perfusion of extremities  Skin: No paleness, no jaundice  Neurologic / Psychiatric: Oriented to time, oriented to place, oriented to person. No depression, no anxiety, no agitation.  Gastrointestinal: No mass, no tenderness, no rigidity, non obese abdomen.  Eyes: Normal conjunctivae. Normal eyelids.  Musculoskeletal: Normal gait and station of head and neck.     PAST DATA REVIEWED:  Source Of History:  Patient  Lab Test Review:   Path Report   PROCEDURES:          Urinalysis w/Scope Dipstick  Dipstick Cont'd Micro  Color: Yellow Bilirubin: Neg mg/dL WBC/hpf: 0 - 5/hpf  Appearance: Cloudy Ketones: Neg mg/dL RBC/hpf: 3 - 10/hpf  Specific Gravity: 1.015 Blood: 3+ ery/uL Bacteria: NS (Not Seen)  pH: 7.5 Protein: 1+ mg/dL Cystals: NS (Not Seen)  Glucose: Neg mg/dL Urobilinogen: 0.2 mg/dL Casts: NS (Not Seen)    Nitrites: Neg Trichomonas: Not Present    Leukocyte Esterase: 1+ leu/uL Mucous: Not Present      Epithelial Cells: 0 - 5/hpf      Yeast: NS (Not Seen)      Sperm: Not Present    ASSESSMENT:      ICD-10 Details  1 GU:   Bladder Cancer overlapping sites - 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 She understands potential risks including but not limited to bleeding, infection, injury to surrounding structures, possible bladder perforation, possible need for additional procedures.   Cc: [name], M.D.  Royetta Car, M.D.    igned by Link Snuffer, III, M.D. on 08/29/18 at 10:42 AM (EST

## 2018-09-18 NOTE — Anesthesia Procedure Notes (Signed)
Procedure Name: Intubation Date/Time: 09/18/2018 10:30 AM Performed by: Suan Halter, CRNA Pre-anesthesia Checklist: Patient identified, Emergency Drugs available, Suction available and Patient being monitored Patient Re-evaluated:Patient Re-evaluated prior to induction Oxygen Delivery Method: Circle system utilized Preoxygenation: Pre-oxygenation with 100% oxygen Induction Type: IV induction Ventilation: Mask ventilation without difficulty Laryngoscope Size: Mac and 3 Tube type: Oral Tube size: 7.0 mm Number of attempts: 1 Airway Equipment and Method: Stylet and Oral airway Placement Confirmation: ETT inserted through vocal cords under direct vision,  positive ETCO2 and breath sounds checked- equal and bilateral Secured at: 21 cm Tube secured with: Tape Dental Injury: Teeth and Oropharynx as per pre-operative assessment

## 2018-09-18 NOTE — Discharge Instructions (Addendum)
Transurethral Resection of Bladder Tumor (TURBT) or Bladder Biopsy   Definition:  Transurethral Resection of the Bladder Tumor is a surgical procedure used to diagnose and remove tumors within the bladder. TURBT is the most common treatment for early stage bladder cancer.  General instructions:     Your recent bladder surgery requires very little post hospital care but some definite precautions.  Despite the fact that no skin incisions were used, the area around the bladder incisions are raw and covered with scabs to promote healing and prevent bleeding. Certain precautions are needed to insure that the scabs are not disturbed over the next 2-4 weeks while the healing proceeds.  Because the raw surface inside your bladder and the irritating effects of urine you may expect frequency of urination and/or urgency (a stronger desire to urinate) and perhaps even getting up at night more often. This will usually resolve or improve slowly over the healing period. You may see some blood in your urine over the first 6 weeks. Do not be alarmed, even if the urine was clear for a while. Get off your feet and drink lots of fluids until clearing occurs. If you start to pass clots or don't improve call us.  Diet:  You may return to your normal diet immediately. Because of the raw surface of your bladder, alcohol, spicy foods, foods high in acid and drinks with caffeine may cause irritation or frequency and should be used in moderation. To keep your urine flowing freely and avoid constipation, drink plenty of fluids during the day (8-10 glasses). Tip: Avoid cranberry juice because it is very acidic.  Activity:  Your physical activity doesn't need to be restricted. However, if you are very active, you may see some blood in the urine. We suggest that you reduce your activity under the circumstances until the bleeding has stopped.  Bowels:  It is important to keep your bowels regular during the postoperative  period. Straining with bowel movements can cause bleeding. A bowel movement every other day is reasonable. Use a mild laxative if needed, such as milk of magnesia 2-3 tablespoons, or 2 Dulcolax tablets. Call if you continue to have problems. If you had been taking narcotics for pain, before, during or after your surgery, you may be constipated. Take a laxative if necessary.   Medication:  You should resume your pre-surgery medications unless told not to. In addition you may be given an antibiotic to prevent or treat infection. Antibiotics are not always necessary. All medication should be taken as prescribed until the bottles are finished unless you are having an unusual reaction to one of the drugs.  Call your surgeon if you experience:   1.  Fever over 101.0. 2.  Inability to urinate. 3.  Nausea and/or vomiting. 4.  Extreme swelling or bruising at the surgical site. 5.  Continued bleeding from the incision. 6.  Increased pain, redness or drainage from the incision. 7.  Problems related to your pain medication. 8.  Any problems and/or concerns   Post Anesthesia Home Care Instructions  Activity: Get plenty of rest for the remainder of the day. A responsible individual must stay with you for 24 hours following the procedure.  For the next 24 hours, DO NOT: -Drive a car -Paediatric nurse -Drink alcoholic beverages -Take any medication unless instructed by your physician -Make any legal decisions or sign important papers.  Meals: Start with liquid foods such as gelatin or soup. Progress to regular foods as tolerated. Avoid greasy, spicy,  heavy foods. If nausea and/or vomiting occur, drink only clear liquids until the nausea and/or vomiting subsides. Call your physician if vomiting continues.  Special Instructions/Symptoms: Your throat may feel dry or sore from the anesthesia or the breathing tube placed in your throat during surgery. If this causes discomfort, gargle with warm salt  water. The discomfort should disappear within 24 hours.

## 2018-09-18 NOTE — Op Note (Signed)
Operative Note  Preoperative diagnosis:  1.  Bladder cancer  Postoperative diagnosis: 1.  Bladder cancer  Procedure(s): 1.  Transurethral resection of bladder tumor, restage, medium  Surgeon: Link Snuffer, MD  Assistants: None  Anesthesia: General  Complications: None immediate  EBL: Minimal  Specimens: 1.  Bladder tumor  Drains/Catheters: 1.  None  Intraoperative findings: Normal urethra 2.  3 separate resection sites re-resected down to muscle fibers and sent for specimen.  Maximum resection site was about 2 cm.  Indication: 77 year old female with a history of bladder cancer recently underwent a TURBT that revealed invasion into lamina propria but no muscularis propria was present.  Presents for the previously mentioned operation.  Description of procedure:  The patient was identified and consent was obtained.  The patient was taken to the operating room and placed in the supine position.  The patient was placed under general anesthesia.  Perioperative antibiotics were administered.  The patient was placed in dorsal lithotomy.  Patient was prepped and draped in a standard sterile fashion and a timeout was performed.  A 26 French resectoscope with the visual obturator in place was advanced into the urethra and into the bladder.  Visual obturator was exchanged for the working element.  On bipolar settings, the areas of interest were resected down to muscle fibers.  The resection beds were fulgurated.  All specimen was collected and sent for permanent specimen.  I reinspected the bladder mucosa and there was no obvious active bleeding.  I drained the bladder and withdrew the scope.  Patient tolerated the procedure well and was stable postoperatively.  Plan: Follow-up in 1 week for pathology review

## 2018-09-19 ENCOUNTER — Encounter (HOSPITAL_BASED_OUTPATIENT_CLINIC_OR_DEPARTMENT_OTHER): Payer: Self-pay | Admitting: Urology

## 2018-10-24 ENCOUNTER — Ambulatory Visit
Admission: RE | Admit: 2018-10-24 | Discharge: 2018-10-24 | Disposition: A | Discharge status: Home / Self Care | Payer: Medicare Other | Source: Ambulatory Visit | Attending: Family Medicine | Admitting: Family Medicine

## 2018-10-24 DIAGNOSIS — Z1231 Encounter for screening mammogram for malignant neoplasm of breast: Secondary | ICD-10-CM

## 2018-11-01 ENCOUNTER — Other Ambulatory Visit: Payer: Self-pay | Admitting: Family Medicine

## 2018-11-01 MED ORDER — LISINOPRIL-HYDROCHLOROTHIAZIDE 20-25 MG PO TABS: 1.0000 | ORAL_TABLET | Freq: Every day | ORAL | 0 refills | Status: DC

## 2018-11-01 NOTE — Telephone Encounter (Signed)
Requested Prescriptions  Pending Prescriptions Disp Refills  . lisinopril-hydrochlorothiazide (PRINZIDE,ZESTORETIC) 20-25 MG tablet 90 tablet 0    Sig: Take 1 tablet by mouth daily.     Cardiovascular:  ACEI + Diuretic Combos Failed - 11/01/2018  1:28 PM      Failed - K in normal range and within 180 days    Potassium  Date Value Ref Range Status  09/18/2018 3.4 (L) 3.5 - 5.1 mmol/L Final         Failed - Cr in normal range and within 180 days    Creatinine, Ser  Date Value Ref Range Status  10/24/2017 0.64 0.40 - 1.20 mg/dL Final         Failed - Ca in normal range and within 180 days    Calcium  Date Value Ref Range Status  10/24/2017 10.4 8.4 - 10.5 mg/dL Final         Failed - Last BP in normal range    BP Readings from Last 1 Encounters:  09/18/18 (!) 167/94         Passed - Na in normal range and within 180 days    Sodium  Date Value Ref Range Status  09/18/2018 141 135 - 145 mmol/L Final         Passed - Patient is not pregnant      Passed - Valid encounter within last 6 months    Recent Outpatient Visits          4 months ago Essential hypertension   Therapist, music at [place], [place]   11 months ago Essential hypertension   Therapist, music at [facility], Doretha Sou, [place]   1 year ago Essential hypertension   Therapist, music at [facility], Doretha Sou, [place]   2 years ago [place] at [facility], Doretha Sou, [place]   4 years ago Hematuria   Therapist, music at [facility], Imogene Burn, [place]

## 2018-11-01 NOTE — Telephone Encounter (Signed)
Copied from [place] [id number]. Topic: Quick Communication - Rx Refill/Question >> Nov 01, 2018  1:11 PM Keene Breath wrote: Medication: lisinopril-hydrochlorothiazide (PRINZIDE,ZESTORETIC) 20-25 MG tablet  Patient called to request a refill for the above medication  Preferred Pharmacy (with phone number or street name): [id number] [place], [place] [phone] (Phone) [phone] (Fax)

## 2019-02-10 ENCOUNTER — Telehealth: Payer: Self-pay | Admitting: Family Medicine

## 2019-02-10 NOTE — Telephone Encounter (Signed)
Copied from [place] [id number]. Topic: Quick Communication - Rx Refill/Question >> Feb 10, 2019  2:00 PM Nils Flack, Marland Kitchen wrote: Medication: lisinopril-hydrochlorothiazide (PRINZIDE,ZESTORETIC) 20-25 MG tablet Has the patient contacted their pharmacy? Yes.   (Agent: If no, request that the patient contact the pharmacy for the refill.) (Agent: If yes, when and what did the pharmacy advise?)  Preferred Pharmacy (with phone number or street name): [place] [address] [facility]  Said that pharm is waiting on provider to call it in   Agent: Please be advised that RX refills may take up to 3 business days. We ask that you follow-up with your pharmacy.

## 2019-02-11 NOTE — Telephone Encounter (Signed)
Called pt LVM for pt to return my call in the office regarding an virtual appointment for a med refill f/u

## 2019-02-12 ENCOUNTER — Other Ambulatory Visit: Payer: Self-pay

## 2019-02-12 ENCOUNTER — Encounter: Payer: Self-pay | Admitting: Family Medicine

## 2019-02-12 ENCOUNTER — Ambulatory Visit (INDEPENDENT_AMBULATORY_CARE_PROVIDER_SITE_OTHER): Payer: Medicare Other | Admitting: Family Medicine

## 2019-02-12 DIAGNOSIS — M1711 Unilateral primary osteoarthritis, right knee: Secondary | ICD-10-CM

## 2019-02-12 DIAGNOSIS — I10 Essential (primary) hypertension: Secondary | ICD-10-CM | POA: Diagnosis not present

## 2019-02-12 MED ORDER — DICLOFENAC SODIUM 1 % TD GEL: 4.0000 g | Freq: Three times a day (TID) | TRANSDERMAL | 3 refills | Status: DC | PRN

## 2019-02-12 MED ORDER — LISINOPRIL-HYDROCHLOROTHIAZIDE 20-25 MG PO TABS: 1.0000 | ORAL_TABLET | Freq: Every day | ORAL | 0 refills | Status: DC

## 2019-02-12 NOTE — Progress Notes (Signed)
Virtual Visit via Telephone Note  I connected with Tracy Davidson on 02/12/19 at  2:00 PM EDT by telephone and verified that I am speaking with the correct person using two identifiers.   I discussed the limitations, risks, security and privacy concerns of performing an evaluation and management service by telephone and the availability of in person appointments. I also discussed with the patient that there may be a patient responsible charge related to this service. The patient expressed understanding and agreed to proceed.  Location patient: home Location provider: home office Participants present for the call: patient, provider Patient did not have a visit in the prior 7 days to address this/these issue(s).   History of Present Illness: Pt states she is doing well overall.  She has seen the urologist for her hematuria.  She completed 6 wks of treatment.  Has a f/u appt in May.  Pt requesting refill on bp med.  Taking lisinopril-HCTZ 20-25 mg daily.  States bp has been good at urology office.  Not checking bp at home.  Pt still having some R knee pain/stiffness.  Seen in Sept, xray with severe tricompartmental arthritis.  Has been taking tylenol, used biofreeze, and blue emu ointment without relief.  Note the pain seems to be on the lateral R knee and goes up into R lateral leg and R hip.  At time pt feels unstable.  Using a cane for ambulation.   Observations/Objective: Patient sounds cheerful and well on the phone. I do not appreciate any SOB. Speech and thought processing are grossly intact. Patient reported vitals:  Assessment and Plan: Essential hypertension  - Plan: lisinopril-hydrochlorothiazide (ZESTORETIC) 20-25 MG tablet -discussed lifestyle modifications  Primary osteoarthritis of right knee  -discussed treatment options - Plan: diclofenac sodium (VOLTAREN) 1 % GEL -if symptoms continue of pain in R hip continues pt to notify clinic.  Will consider xray R hip.  Also  discussed Ortho referral for continued symptoms  Follow Up Instructions: F/u prn in the next month, sooner if needed  I did not refer this patient for an OV in the next 24 hours for this/these issue(s).  I discussed the assessment and treatment plan with the patient. The patient was provided an opportunity to ask questions and all were answered. The patient agreed with the plan and demonstrated an understanding of the instructions.   The patient was advised to call back or seek an in-person evaluation if the symptoms worsen or if the condition fails to improve as anticipated.  I provided 15 minutes of non-face-to-face time during this encounter.   Billie Ruddy, MD

## 2019-02-17 NOTE — Telephone Encounter (Signed)
Pt had a virtual appointment on 02/12/2019

## 2019-04-07 ENCOUNTER — Other Ambulatory Visit: Payer: Self-pay | Admitting: Family Medicine

## 2019-04-08 ENCOUNTER — Telehealth: Payer: Self-pay | Admitting: Family Medicine

## 2019-04-08 NOTE — Telephone Encounter (Signed)
Pt has an appt on 04-10-2019 and needs a refill on [facility] [address]

## 2019-04-10 ENCOUNTER — Ambulatory Visit (INDEPENDENT_AMBULATORY_CARE_PROVIDER_SITE_OTHER): Payer: Medicare Other | Admitting: Family Medicine

## 2019-04-10 ENCOUNTER — Other Ambulatory Visit: Payer: Self-pay

## 2019-04-10 ENCOUNTER — Encounter: Payer: Self-pay | Admitting: Family Medicine

## 2019-04-10 DIAGNOSIS — M25562 Pain in left knee: Secondary | ICD-10-CM

## 2019-04-10 DIAGNOSIS — M8588 Other specified disorders of bone density and structure, other site: Secondary | ICD-10-CM | POA: Diagnosis not present

## 2019-04-10 DIAGNOSIS — M1711 Unilateral primary osteoarthritis, right knee: Secondary | ICD-10-CM | POA: Diagnosis not present

## 2019-04-10 MED ORDER — TRAMADOL HCL 50 MG PO TABS: 25.0000 mg | ORAL_TABLET | Freq: Two times a day (BID) | ORAL | 0 refills | Status: AC | PRN

## 2019-04-10 NOTE — Progress Notes (Signed)
Virtual Visit via Telephone Note  I connected with Tracy Davidson on 04/10/19 at 11:00 AM EDT by telephone and verified that I am speaking with the correct person using two identifiers.   I discussed the limitations, risks, security and privacy concerns of performing an evaluation and management service by telephone and the availability of in person appointments. I also discussed with the patient that there may be a patient responsible charge related to this service. The patient expressed understanding and agreed to proceed.  Location patient: home Location provider: work or home office Participants present for the call: patient, provider Patient did not have a visit in the prior 7 days to address this/these issue(s).   History of Present Illness: Pt is a 78 yo female with pmh sig for HTN, OA, HLD, h/o cataracts now with L knee pain.  States she has never had pain in R knee like she has in her L knee. Pain started 2 wks ago when pt was sitting in her chair.  Tried extra strength tylenol, biofreeze, and voltaren gel.  Pt denies edema, erythema, or recent falls.  Pt occasionally hears noise from that knee but it is not new.  R knee not causing any issues at the moment.  OA noted on xray.   Observations/Objective: Patient sounds cheerful and well on the phone. I do not appreciate any SOB. Speech and thought processing are grossly intact. Patient reported vitals:  Assessment and Plan: Acute pain of left knee  -likely 2/2 OA, fx less likely as no injury reported however has h/o of osteopenia per xray R knee. -discussed obtaining xray of L knee.  Pt wishes to wait until Ortho appt -discussed supportive care such as heat, massage, rest, topical creams. -Discussed short course of tramadol prn until ortho appt. - Plan: Ambulatory referral to Orthopedic Surgery, traMADol (ULTRAM) 50 MG tablet  Primary osteoarthritis of right knee  -stable -last xray 06/20/18 - Plan: Ambulatory referral to  Orthopedic Surgery, traMADol (ULTRAM) 50 MG tablet  Osteopenia of other site -severe osteopenia noted on xray of R knee  -discussed taking Vitamin D and calcium daily - Plan: DG Bone Density  Follow Up Instructions: F/u prn  I did not refer this patient for an OV in the next 24 hours for this/these issue(s).  I discussed the assessment and treatment plan with the patient. The patient was provided an opportunity to ask questions and all were answered. The patient agreed with the plan and demonstrated an understanding of the instructions.   The patient was advised to call back or seek an in-person evaluation if the symptoms worsen or if the condition fails to improve as anticipated.  I provided 12 minutes of non-face-to-face time during this encounter.   Billie Ruddy, MD

## 2019-04-10 NOTE — Telephone Encounter (Signed)
This Rx was d/c by Dr Volanda Napoleon on 04/10/2019

## 2019-04-23 ENCOUNTER — Ambulatory Visit (INDEPENDENT_AMBULATORY_CARE_PROVIDER_SITE_OTHER): Payer: Medicare Other | Admitting: Orthopedic Surgery

## 2019-04-23 ENCOUNTER — Other Ambulatory Visit: Payer: Self-pay

## 2019-04-23 ENCOUNTER — Ambulatory Visit: Payer: Self-pay

## 2019-04-23 ENCOUNTER — Encounter: Payer: Self-pay | Admitting: Orthopedic Surgery

## 2019-04-23 DIAGNOSIS — M170 Bilateral primary osteoarthritis of knee: Secondary | ICD-10-CM | POA: Diagnosis not present

## 2019-04-23 DIAGNOSIS — M25562 Pain in left knee: Secondary | ICD-10-CM | POA: Diagnosis not present

## 2019-04-23 DIAGNOSIS — M25561 Pain in right knee: Secondary | ICD-10-CM

## 2019-04-23 DIAGNOSIS — G8929 Other chronic pain: Secondary | ICD-10-CM

## 2019-04-24 ENCOUNTER — Encounter: Payer: Self-pay | Admitting: Orthopedic Surgery

## 2019-04-24 DIAGNOSIS — M170 Bilateral primary osteoarthritis of knee: Secondary | ICD-10-CM | POA: Diagnosis not present

## 2019-04-24 MED ORDER — BUPIVACAINE HCL 0.25 % IJ SOLN
4.0000 mL | INTRAMUSCULAR | Status: AC | PRN
  Administered 2019-04-24: 4 mL via INTRA_ARTICULAR

## 2019-04-24 MED ORDER — LIDOCAINE HCL 1 % IJ SOLN
5.0000 mL | INTRAMUSCULAR | Status: AC | PRN
  Administered 2019-04-24: 5 mL

## 2019-04-24 MED ORDER — METHYLPREDNISOLONE ACETATE 40 MG/ML IJ SUSP
40.0000 mg | INTRAMUSCULAR | Status: AC | PRN
  Administered 2019-04-24: 40 mg via INTRA_ARTICULAR

## 2019-04-24 NOTE — Progress Notes (Signed)
Office Visit Note   Patient: Tracy Davidson           Date of Birth: October 05, 1941           MRN: [id number] Visit Date: 04/23/2019 Requested by: Billie Ruddy, MD [place],  [place] [address] PCP: Billie Ruddy, MD  Subjective: Chief Complaint  Patient presents with  . Right Knee - Pain  . Left Knee - Pain    HPI: Tracy Davidson is a patient with bilateral knee pain left worse than right.  She ambulates with a cane.  In general she ambulates in her household but does not get out too much.  Denies any history of injury.  Symptoms have been going on for several months.  She describes weakness and giving way.  Denies any numbness and tingling in the pain does not wake her from sleep.  No history of surgery or injections in either knee.  Pain is worse with standing and going up and down stairs.  Patient does live by herself.  She has no stairs at her house.  She does use a cane to assist with ambulation.              ROS: All systems reviewed are negative as they relate to the chief complaint within the history of present illness.  Patient denies  fevers or chills.   Assessment & Plan: Visit Diagnoses:  1. Chronic pain of both knees     Plan: Impression is bilateral knee pain left worse than right.  She wants to avoid surgical intervention due to the fact that she does live by herself and does not have much family support around.  Nonetheless I think we can help her at least temporarily with an injection.  That is done today.  Plan to inject the right knee in several weeks.  Follow-Up Instructions: Return in about 4 weeks (around 05/21/2019).   Orders:  Orders Placed This Encounter  Procedures  . XR Knee 1-2 Views Right  . XR Knee 1-2 Views Left   No orders of the defined types were placed in this encounter.     Procedures: Large Joint Inj: L knee on 04/24/2019 9:41 PM Indications: diagnostic evaluation, joint swelling and pain Details: 18 G 1.5 in needle,  superolateral approach  Arthrogram: No  Medications: 5 mL lidocaine 1 %; 40 mg methylPREDNISolone acetate 40 MG/ML; 4 mL bupivacaine 0.25 % Outcome: tolerated well, no immediate complications Procedure, treatment alternatives, risks and benefits explained, specific risks discussed. Consent was given by the patient. Immediately prior to procedure a time out was called to verify the correct patient, procedure, equipment, support staff and site/side marked as required. Patient was prepped and draped in the usual sterile fashion.       Clinical Data: No additional findings.  Objective: Vital Signs: There were no vitals taken for this visit.  Physical Exam:   Constitutional: Patient appears well-developed HEENT:  Head: Normocephalic Eyes:EOM are normal Neck: Normal range of motion Cardiovascular: Normal rate Pulmonary/chest: Effort normal Neurologic: Patient is alert Skin: Skin is warm Psychiatric: Patient has normal mood and affect    Ortho Exam: Ortho exam demonstrates slight valgus alignment bilateral lower extremities with palpable pedal pulses.  No masses lymphadenopathy or skin changes palpable in either knee.  Extensor mechanism is intact bilaterally.  No groin pain with internal X rotation of the leg.  She has stable collateral cruciate ligaments.  Muscle tone symmetric bilaterally with no atrophy.  I  cannot palpate the calcified loose body in the posterior aspect of that left knee.  She does have a mild effusion in the left knee no effusion in the right knee.  Specialty Comments:  No specialty comments available.  Imaging: No results found.   PMFS History: Patient Active Problem List   Diagnosis Date Noted  . Tobacco abuse 03/14/2012  . LIVER FUNCTION TESTS, ABNORMAL, HX OF 09/21/2009  . Dyslipidemia 09/20/2009  . ALLERGIC RHINITIS 09/20/2009  . Essential hypertension 06/06/2007  . COLONIC POLYPS, HX OF 06/06/2007   Past Medical History:  Diagnosis Date  .  ALLERGIC RHINITIS 09/20/2009  . Allergy   . Arthritis   . Cataract    both eyes, right worse thsn left  . COLONIC POLYPS, HX OF 06/06/2007  . DYSLIPIDEMIA 09/20/2009  . HYPERTENSION 06/06/2007  . Knee pain    stiff and pain on side  . LIVER FUNCTION TESTS, ABNORMAL, HX OF    feb or march 2019    Family History  Problem Relation Age of Onset  . Colon cancer Neg Hx   . Colon polyps Neg Hx   . Esophageal cancer Neg Hx   . Rectal cancer Neg Hx   . Stomach cancer Neg Hx   . Pancreatic cancer Neg Hx   . Breast cancer Neg Hx     Past Surgical History:  Procedure Laterality Date  . BREAST EXCISIONAL BIOPSY Left 1975  . BREAST SURGERY     breast bx  . COLONOSCOPY    . DILATION AND CURETTAGE OF UTERUS    . TONSILLECTOMY    . TOTAL ABDOMINAL HYSTERECTOMY     complete  . 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 TRANSURETHRAL RESECTION OF BLADDER TUMOR WITH MITOMYCIN-C N/A 08/16/2018   Procedure: TRANSURETHRAL RESECTION OF BLADDER TUMOR;  Surgeon: Lucas Mallow, MD;  Location: [facility];  Service: Urology;  Laterality: N/A;   Social History   Occupational History  . Not on file  Tobacco Use  . Smoking status: Current Every Day Smoker    Packs/day: 1.00    Years: 50.00    Pack years: 50.00    Types: Cigarettes  . Smokeless tobacco: Never Used  Substance and Sexual Activity  . Alcohol use: Yes    Alcohol/week: 2.0 standard drinks    Types: 2 Shots of liquor per week    Comment: daily 2 -3 per day  . Drug use: No  . Sexual activity: Not on file

## 2019-04-28 ENCOUNTER — Telehealth: Payer: Self-pay | Admitting: Orthopedic Surgery

## 2019-04-28 NOTE — Telephone Encounter (Signed)
Please advise Knee injection done 07/08.

## 2019-04-28 NOTE — Telephone Encounter (Signed)
Try OTC first along with ice.  If that does not work tramadol 1/day for 10 days may help.

## 2019-04-28 NOTE — Telephone Encounter (Signed)
Patient having pain going down the leg. Wants to know what she can take for this pain?  Please call patient to advise [phone]

## 2019-04-29 NOTE — Telephone Encounter (Signed)
IC s/w patient and advised  

## 2019-05-07 ENCOUNTER — Ambulatory Visit
Admission: RE | Admit: 2019-05-07 | Discharge: 2019-05-07 | Disposition: A | Discharge status: Home / Self Care | Payer: Medicare Other | Source: Ambulatory Visit | Attending: Family Medicine | Admitting: Family Medicine

## 2019-05-07 ENCOUNTER — Other Ambulatory Visit: Payer: Self-pay

## 2019-05-07 DIAGNOSIS — M8588 Other specified disorders of bone density and structure, other site: Secondary | ICD-10-CM

## 2019-05-08 ENCOUNTER — Telehealth: Payer: Self-pay | Admitting: Family Medicine

## 2019-05-08 ENCOUNTER — Other Ambulatory Visit: Payer: Self-pay | Admitting: *Deleted

## 2019-05-08 DIAGNOSIS — I10 Essential (primary) hypertension: Secondary | ICD-10-CM

## 2019-05-08 MED ORDER — LISINOPRIL-HYDROCHLOROTHIAZIDE 20-25 MG PO TABS: 1.0000 | ORAL_TABLET | Freq: Every day | ORAL | 0 refills | Status: DC

## 2019-05-08 NOTE — Telephone Encounter (Signed)
Rx refill sent.

## 2019-05-08 NOTE — Telephone Encounter (Signed)
Copied from [place] [id number]. Topic: Quick Communication - Rx Refill/Question >> May 08, 2019 12:44 PM Leward Quan A wrote: Medication: lisinopril-hydrochlorothiazide (ZESTORETIC) 20-25 MG tablet   Has the patient contacted their pharmacy? Yes.   (Agent: If no, request that the patient contact the pharmacy for the refill.) (Agent: If yes, when and what did the pharmacy advise?)  Preferred Pharmacy (with phone number or street name): [id number] [place], [place] [phone] (Phone) [phone] (Fax)    Agent: Please be advised that RX refills may take up to 3 business days. We ask that you follow-up with your pharmacy.

## 2019-05-14 ENCOUNTER — Telehealth: Payer: Self-pay | Admitting: *Deleted

## 2019-05-14 NOTE — Telephone Encounter (Signed)
Copied from [place] [id number]. Topic: General - Other >> May 13, 2019  3:48 PM Sheran Luz wrote: Patient requesting call back from [facility] regarding bone density scan.

## 2019-05-14 NOTE — Telephone Encounter (Signed)
Spoke with patient. Patient wanted to know what osteopetrosis is and can she take calcium OTC. Explained what osteopetrosis is and that she can take OTC.

## 2019-05-28 ENCOUNTER — Ambulatory Visit (INDEPENDENT_AMBULATORY_CARE_PROVIDER_SITE_OTHER): Payer: Medicare Other | Admitting: Orthopedic Surgery

## 2019-05-28 ENCOUNTER — Other Ambulatory Visit: Payer: Self-pay

## 2019-05-28 ENCOUNTER — Ambulatory Visit: Payer: Self-pay

## 2019-05-28 DIAGNOSIS — M545 Low back pain, unspecified: Secondary | ICD-10-CM

## 2019-05-28 MED ORDER — MELOXICAM 7.5 MG PO TABS: 7.5000 mg | ORAL_TABLET | Freq: Every day | ORAL | 0 refills | Status: DC

## 2019-05-28 MED ORDER — MELOXICAM 7.5 MG PO TABS: ORAL_TABLET | ORAL | 0 refills | Status: DC

## 2019-05-28 NOTE — Progress Notes (Signed)
lumbar

## 2019-05-30 ENCOUNTER — Encounter: Payer: Self-pay | Admitting: Orthopedic Surgery

## 2019-05-30 NOTE — Progress Notes (Signed)
Office Visit Note   Patient: Tracy Davidson           Date of Birth: 1941-03-11           MRN: [id number] Visit Date: 05/28/2019 Requested by: Billie Ruddy, MD [place],  [place] [address] PCP: Billie Ruddy, MD  Subjective: No chief complaint on file.   HPI: Tracy Davidson is a patient who presents for follow-up.  Had an injection 04/23/2019.  This was into the left knee.  The pain in the left knee is actually better but now she reports bilateral tibial pain along with decreased standing and walking endurance.  She has what she describes as "a little" low back pain.  She has been using extra strength Tylenol and salon pas.  Not taking any pain medicine.  We were considering right knee injection but her right knee is feeling a little bit better now.  She does describe some paresthesias in both legs..              ROS: All systems reviewed are negative as they relate to the chief complaint within the history of present illness.  Patient denies  fevers or chills.   Assessment & Plan: Visit Diagnoses:  1. Low back pain, unspecified back pain laterality, unspecified chronicity, unspecified whether sciatica present     Plan: Impression is low back pain which is mild with what sounds like bilateral neurogenic claudication.  Pedal pulses are palpable.  Radiographs do show some spondylolisthesis.  I talked with her at length about getting an MRI scan and potentially doing injections into her back.  She may also need surgery if she has significant spinal stenosis which by her symptoms I suspect she might have.  Rena try Mobic instead 7-1/2 mg a day for 30 days and then depending on her standing and walking endurance and progression we can potentially do MRI scanning at that time.  Follow-Up Instructions: Return if symptoms worsen or fail to improve.   Orders:  Orders Placed This Encounter  Procedures  . XR Lumbar Spine 2-3 Views   Meds ordered this encounter  Medications   . meloxicam (MOBIC) 7.5 MG tablet    Sig: Take 1 tablet (7.5 mg total) by mouth daily.    Dispense:  30 tablet    Refill:  0  . meloxicam (MOBIC) 7.5 MG tablet    Sig: 1 po q d x 30 days    Dispense:  30 tablet    Refill:  0      Procedures: No procedures performed   Clinical Data: No additional findings.  Objective: Vital Signs: There were no vitals taken for this visit.  Physical Exam:   Constitutional: Patient appears well-developed HEENT:  Head: Normocephalic Eyes:EOM are normal Neck: Normal range of motion Cardiovascular: Normal rate Pulmonary/chest: Effort normal Neurologic: Patient is alert Skin: Skin is warm Psychiatric: Patient has normal mood and affect    Ortho Exam: Ortho exam demonstrates full active and passive range of motion of the knees hips and ankles.  No nerve root tension signs.  Both feet are perfused.  Pedal pulses palpable.  Reflexes symmetric.  Bilateral Achilles and patella.  No muscle atrophy in the legs.  Not much in the way of pain with forward lateral bending.  No right or left knee effusion is present.  Specialty Comments:  No specialty comments available.  Imaging: No results found.   PMFS History: Patient Active Problem List   Diagnosis Date  Noted  . Tobacco abuse 03/14/2012  . LIVER FUNCTION TESTS, ABNORMAL, HX OF 09/21/2009  . Dyslipidemia 09/20/2009  . ALLERGIC RHINITIS 09/20/2009  . Essential hypertension 06/06/2007  . COLONIC POLYPS, HX OF 06/06/2007   Past Medical History:  Diagnosis Date  . ALLERGIC RHINITIS 09/20/2009  . Allergy   . Arthritis   . Cataract    both eyes, right worse thsn left  . COLONIC POLYPS, HX OF 06/06/2007  . DYSLIPIDEMIA 09/20/2009  . HYPERTENSION 06/06/2007  . Knee pain    stiff and pain on side  . LIVER FUNCTION TESTS, ABNORMAL, HX OF    feb or march 2019    Family History  Problem Relation Age of Onset  . Colon cancer Neg Hx   . Colon polyps Neg Hx   . Esophageal cancer Neg Hx   .  Rectal cancer Neg Hx   . Stomach cancer Neg Hx   . Pancreatic cancer Neg Hx   . Breast cancer Neg Hx     Past Surgical History:  Procedure Laterality Date  . BREAST EXCISIONAL BIOPSY Left 1975  . BREAST SURGERY     breast bx  . COLONOSCOPY    . DILATION AND CURETTAGE OF UTERUS    . TONSILLECTOMY    . TOTAL ABDOMINAL HYSTERECTOMY     complete  . 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 TRANSURETHRAL RESECTION OF BLADDER TUMOR WITH MITOMYCIN-C N/A 08/16/2018   Procedure: TRANSURETHRAL RESECTION OF BLADDER TUMOR;  Surgeon: Lucas Mallow, MD;  Location: [facility];  Service: Urology;  Laterality: N/A;   Social History   Occupational History  . Not on file  Tobacco Use  . Smoking status: Current Every Day Smoker    Packs/day: 1.00    Years: 50.00    Pack years: 50.00    Types: Cigarettes  . Smokeless tobacco: Never Used  Substance and Sexual Activity  . Alcohol use: Yes    Alcohol/week: 2.0 standard drinks    Types: 2 Shots of liquor per week    Comment: daily 2 -3 per day  . Drug use: No  . Sexual activity: Not on file

## 2019-06-10 ENCOUNTER — Telehealth: Payer: Self-pay

## 2019-06-10 NOTE — Telephone Encounter (Signed)
Copied from [place] [id number]. 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    Abigail Butts would like for provider to assist pt further with this.   CB: [phone] - if needed.

## 2019-06-12 NOTE — Telephone Encounter (Signed)
Please advise 

## 2019-06-16 NOTE — Telephone Encounter (Signed)
ok 

## 2019-06-17 ENCOUNTER — Other Ambulatory Visit: Payer: Self-pay

## 2019-06-17 DIAGNOSIS — I779 Disorder of arteries and arterioles, unspecified: Secondary | ICD-10-CM

## 2019-06-17 NOTE — Telephone Encounter (Signed)
Referral to vascular surgery has been placed

## 2019-06-24 NOTE — Telephone Encounter (Signed)
Referral already placed.

## 2019-07-22 ENCOUNTER — Telehealth (INDEPENDENT_AMBULATORY_CARE_PROVIDER_SITE_OTHER): Payer: Medicare Other | Admitting: Family Medicine

## 2019-07-22 ENCOUNTER — Other Ambulatory Visit: Payer: Self-pay

## 2019-07-22 DIAGNOSIS — K5900 Constipation, unspecified: Secondary | ICD-10-CM

## 2019-07-22 DIAGNOSIS — R630 Anorexia: Secondary | ICD-10-CM

## 2019-07-22 DIAGNOSIS — K219 Gastro-esophageal reflux disease without esophagitis: Secondary | ICD-10-CM

## 2019-07-22 MED ORDER — OMEPRAZOLE 20 MG PO CPDR: 20.0000 mg | DELAYED_RELEASE_CAPSULE | Freq: Every day | ORAL | 3 refills | Status: DC

## 2019-07-22 NOTE — Progress Notes (Signed)
Virtual Visit via Telephone Note  I connected with Tracy Davidson on 07/22/19 at  1:00 PM EDT by telephone and verified that I am speaking with the correct person using two identifiers.   I discussed the limitations, risks, security and privacy concerns of performing an evaluation and management service by telephone and the availability of in person appointments. I also discussed with the patient that there may be a patient responsible charge related to this service. The patient expressed understanding and agreed to proceed.  Location patient: home Location provider: work or home office Participants present for the call: patient, provider Patient did not have a visit in the prior 7 days to address this/these issue(s).   History of Present Illness: Pt is a 78 yo female with pmh sig for HTN, polycythemia, tobacco use, seasonal allergies seen for acute concern.  Notes "gathering of water in mouth" x 3-4 days.  Happens at night, then started occurring during the day.  Pt endorses constipation, no appetite x 1 wk, rhinorrhea, and an occasional productive cough that happens when the water gathers in her mouth.  Unsure if having chills.  Last "good bowel movement was Saturday".  Used dulcolax.  Not drinking much water. Pt denies nausea, emesis, fever, bloating, .  Eating a few carrots, mashed potatoes, broccoli, salisbury steak, watermelon.  Pt does not recall sick contacts. Pt has not been in an store in 2 wks.    Pt being followed by vein and vascular for pain in lateral thighs.   Observations/Objective: Patient sounds cheerful and well on the phone. I do not appreciate any SOB. Speech and thought processing are grossly intact. Patient reported vitals:  Assessment and Plan: Mild acid reflux  -watery mouth possibly 2/2 increased gastric acid. - Plan: omeprazole (PRILOSEC) 20 MG capsule  Constipation, unspecified constipation type -ok to use ducolax -discussed trying Miralax -pt encouraged  to increase po intake of water/fluids, and increase fiber intake.  Decreased appetite -discussed possible causes including increased gastric acid, viral etiology-including COVID, stress/anxiety, constipation -will start prilosec -discussed importance of staying hydrated, eating small meals/advancing diet as tolerated. -consider COVID testing.   Follow Up Instructions: In the next few days prn for continued symptoms  I did not refer this patient for an OV in the next 24 hours for this/these issue(s).  I discussed the assessment and treatment plan with the patient. The patient was provided an opportunity to ask questions and all were answered. The patient agreed with the plan and demonstrated an understanding of the instructions.   The patient was advised to call back or seek an in-person evaluation if the symptoms worsen or if the condition fails to improve as anticipated.  I provided 22 minutes of non-face-to-face time during this encounter.   Billie Ruddy, MD

## 2019-07-23 ENCOUNTER — Other Ambulatory Visit: Payer: Self-pay

## 2019-07-23 DIAGNOSIS — I739 Peripheral vascular disease, unspecified: Secondary | ICD-10-CM

## 2019-07-25 DIAGNOSIS — C678 Malignant neoplasm of overlapping sites of bladder: Secondary | ICD-10-CM | POA: Diagnosis not present

## 2019-07-28 ENCOUNTER — Other Ambulatory Visit: Payer: Self-pay | Admitting: Family Medicine

## 2019-07-28 DIAGNOSIS — I10 Essential (primary) hypertension: Secondary | ICD-10-CM

## 2019-07-29 ENCOUNTER — Ambulatory Visit (HOSPITAL_COMMUNITY)
Admission: RE | Admit: 2019-07-29 | Discharge: 2019-07-29 | Disposition: A | Discharge status: Home / Self Care | Payer: Medicare Other | Source: Ambulatory Visit | Attending: Vascular Surgery | Admitting: Vascular Surgery

## 2019-07-29 ENCOUNTER — Other Ambulatory Visit: Payer: Self-pay

## 2019-07-29 ENCOUNTER — Ambulatory Visit (INDEPENDENT_AMBULATORY_CARE_PROVIDER_SITE_OTHER): Payer: Medicare Other | Admitting: Vascular Surgery

## 2019-07-29 ENCOUNTER — Encounter: Payer: Self-pay | Admitting: Vascular Surgery

## 2019-07-29 DIAGNOSIS — I739 Peripheral vascular disease, unspecified: Secondary | ICD-10-CM

## 2019-07-29 MED ORDER — CILOSTAZOL 100 MG PO TABS: 100.0000 mg | ORAL_TABLET | Freq: Two times a day (BID) | ORAL | 11 refills | Status: DC

## 2019-07-29 NOTE — Progress Notes (Signed)
Patient name: Tracy Davidson MRN: [id number] DOB: 12-20-1940 Sex: female  REASON FOR CONSULT: Evaluate right leg artery disease  HPI: Tracy Davidson is a 78 y.o. female, with history of hypertension and dyslipidemia that presents for evaluation of possible arterial disease in her right leg.  Patient states she had a home health screening when they identified a blood flow problem in the right leg.  She states she has been having issues with her right leg for a while that has been worse over the last month.  Has been having some problems in the leg for 3-4+ months.  She describes a lot of cramping pain in her calf and thigh that is particularly worse when walking.  She has somewhat limited ambulation but does walk around the house.  No tissue loss on her lower extremities.  No pain in the feet themselves or toes.  She has had no previous revascularizations.  She does smoke a pack a day and has done so for years.  Does not take an aspirin.  Past Medical History:  Diagnosis Date  . ALLERGIC RHINITIS 09/20/2009  . Allergy   . Arthritis   . Cataract    both eyes, right worse thsn left  . COLONIC POLYPS, HX OF 06/06/2007  . DYSLIPIDEMIA 09/20/2009  . HYPERTENSION 06/06/2007  . Knee pain    stiff and pain on side  . LIVER FUNCTION TESTS, ABNORMAL, HX OF    feb or march 2019    Past Surgical History:  Procedure Laterality Date  . BREAST EXCISIONAL BIOPSY Left 1975  . BREAST SURGERY     breast bx  . COLONOSCOPY    . DILATION AND CURETTAGE OF UTERUS    . TONSILLECTOMY    . TOTAL ABDOMINAL HYSTERECTOMY     complete  . 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 TRANSURETHRAL RESECTION OF BLADDER TUMOR WITH MITOMYCIN-C N/A 08/16/2018   Procedure: TRANSURETHRAL RESECTION OF BLADDER TUMOR;  Surgeon:  Lucas Mallow, MD;  Location: [facility];  Service: Urology;  Laterality: N/A;    Family History  Problem Relation Age of Onset  . Colon cancer Neg Hx   . Colon polyps Neg Hx   . Esophageal cancer Neg Hx   . Rectal cancer Neg Hx   . Stomach cancer Neg Hx   . Pancreatic cancer Neg Hx   . Breast cancer Neg Hx     SOCIAL HISTORY: Social History   Socioeconomic History  . Marital status: Single    Spouse name: Not on file  . Number of children: Not on file  . Years of education: Not on file  . Highest education level: Not on file  Occupational History  . Not on file  Social Needs  . Financial resource strain: Not on file  . Food insecurity    Worry: Not on file    Inability: Not on file  . Transportation needs    Medical: Not on file    Non-medical: Not on file  Tobacco Use  . Smoking status: Current Every Day Smoker    Packs/day: 1.00    Years: 50.00    Pack years: 50.00    Types: Cigarettes  . Smokeless tobacco: Never Used  Substance and Sexual Activity  . Alcohol use: Yes  Alcohol/week: 2.0 standard drinks    Types: 2 Shots of liquor per week    Comment: daily 2 -3 per day  . Drug use: No  . Sexual activity: Not on file  Lifestyle  . Physical activity    Days per week: Not on file    Minutes per session: Not on file  . Stress: Not on file  Relationships  . Social Herbalist on phone: Not on file    Gets together: Not on file    Attends religious service: Not on file    Active member of club or organization: Not on file    Attends meetings of clubs or organizations: Not on file    Relationship status: Not on file  . Intimate partner violence    Fear of current or ex partner: Not on file    Emotionally abused: Not on file    Physically abused: Not on file    Forced sexual activity: Not on file  Other Topics Concern  . Not on file  Social History Narrative  . Not on file    Allergies  Allergen Reactions  .  Doxycycline     REACTION: tachycardia    Current Outpatient Medications  Medication Sig Dispense Refill  . lisinopril-hydrochlorothiazide (ZESTORETIC) 20-25 MG tablet TAKE 1 TABLET BY MOUTH EVERY DAY 90 tablet 0  . Menthol, Topical Analgesic, 2.5 % LIQD Apply topically daily. blu emu to side of right knee and thigh and hip    . omeprazole (PRILOSEC) 20 MG capsule Take 1 capsule (20 mg total) by mouth daily. 30 capsule 3  . potassium chloride SA (KLOR-CON M20) 20 MEQ tablet Take 1 tablet (20 mEq total) by mouth daily. 90 tablet 2  . cilostazol (PLETAL) 100 MG tablet Take 1 tablet (100 mg total) by mouth 2 (two) times daily before a meal. 60 tablet 11  . diclofenac sodium (VOLTAREN) 1 % GEL Apply 4 g topically 3 (three) times daily as needed. (Patient not taking: Reported on 07/29/2019) 100 g 3  . HYDROcodone-acetaminophen (NORCO) 5-325 MG tablet Take 1 tablet by mouth every 4 (four) hours as needed for moderate pain. (Patient not taking: Reported on 07/29/2019) 10 tablet 0  . meloxicam (MOBIC) 7.5 MG tablet Take 1 tablet (7.5 mg total) by mouth daily. (Patient not taking: Reported on 07/29/2019) 30 tablet 0  . meloxicam (MOBIC) 7.5 MG tablet 1 po q d x 30 days (Patient not taking: Reported on 07/29/2019) 30 tablet 0  . Menthol, Topical Analgesic, (BIOFREEZE) 4 % GEL Apply topically. prn     No current facility-administered medications for this visit.     REVIEW OF SYSTEMS:  [X]  denotes positive finding, [ ]  denotes negative finding Cardiac  Comments:  Chest pain or chest pressure:    Shortness of breath upon exertion:    Short of breath when lying flat:    Irregular heart rhythm:        Vascular    Pain in calf, thigh, or hip brought on by ambulation: x   Pain in feet at night that wakes you up from your sleep:     Blood clot in your veins:    Leg swelling:         Pulmonary    Oxygen at home:    Productive cough:     Wheezing:         Neurologic    Sudden weakness in arms or  legs:     Sudden  numbness in arms or legs:     Sudden onset of difficulty speaking or slurred speech:    Temporary loss of vision in one eye:     Problems with dizziness:         Gastrointestinal    Blood in stool:     Vomited blood:         Genitourinary    Burning when urinating:     Blood in urine:        Psychiatric    Major depression:         Hematologic    Bleeding problems:    Problems with blood clotting too easily:        Skin    Rashes or ulcers:        Constitutional    Fever or chills:      PHYSICAL EXAM: Vitals:   07/29/19 0956  BP: (!) 163/100  Pulse: 71  Resp: 14  Temp: (!) 97 F (36.1 C)  TempSrc: Temporal  SpO2: 99%  Weight: 112 lb (50.8 kg)  Height: 5\' 3"  (1.6 m)    GENERAL: The patient is a well-nourished female, in no acute distress. The vital signs are documented above. CARDIAC: There is a regular rate and rhythm.  VASCULAR:  1+ palpable right radial pulse 2+ palpable left radial pulse 2+ palpable femoral pulses bilateral Left dorsalis pedis strongly palpable Right dorsalis pedis palpable but weaker PULMONARY: There is good air exchange bilaterally without wheezing or rales. ABDOMEN: Soft and non-tender with normal pitched bowel sounds.  MUSCULOSKELETAL: There are no major deformities or cyanosis. NEUROLOGIC: No focal weakness or paresthesias are detected. SKIN: There are no ulcers or rashes noted. PSYCHIATRIC: The patient has a normal affect.  DATA:   I independently reviewed ABIs which are 0.87 on the right but she has no posterior tibial signal and abnormal toe pressure. On the left she has an ABI 1.04 with a biphasic waveform with flat toe tracing.  Assessment/Plan:  78 year old female with history of heavy tobacco abuse that presents with a mild to moderate peripheral arterial disease.  Her symptoms are a bit vague but she does state that her right leg is worse than the left and her ABIs are lower on the right with a very weak  dorsalis pedis and no posterior tibial pulse on exam.  She does state worse with walking..  Discussed that certainly her symptoms could be consistent with claudication.  Discussed that we typically try to manage claudication conservatively given it is not a limb threatening situation.  I have recommended that she start Pletal twice daily to see if this helps her lower extremity symptoms and claudication symptoms as well as starting a baby aspirin daily.  I will plan to see her back in 3 months with repeat ABI's and we we can monitor symptoms.  Certainly discussed that if she has progression we could always do an arteriogram to further evaluate.  ABI's would suggest tibial and small vessel disease.   Marty Heck, MD [facility] [place] Office: [phone] Pager: [phone]

## 2019-08-18 ENCOUNTER — Telehealth: Payer: Self-pay | Admitting: Family Medicine

## 2019-08-18 NOTE — Telephone Encounter (Signed)
Copied from [place] [id number]. Topic: General - Other >> Aug 18, 2019  3:57 PM Keene Breath wrote: Reason for CRM: Patient called to ask the nurse or doctor if she should continue to take the Prilosec medication and why.  Please advise and call to discuss at [phone]

## 2019-08-19 ENCOUNTER — Ambulatory Visit: Payer: Medicare Other | Admitting: Family Medicine

## 2019-08-22 NOTE — Telephone Encounter (Signed)
Spoke with pt aware that she needs to pick up Rx and start taking for her acid reflux, pt verbalized understanding

## 2019-09-22 ENCOUNTER — Other Ambulatory Visit: Payer: Self-pay | Admitting: Family Medicine

## 2019-09-22 DIAGNOSIS — Z1231 Encounter for screening mammogram for malignant neoplasm of breast: Secondary | ICD-10-CM

## 2019-09-25 ENCOUNTER — Telehealth: Payer: Self-pay

## 2019-09-25 NOTE — Telephone Encounter (Signed)
Pt called and said that she is having increased pain in her legs. She said that she has been doing the recommended things that Dr Carlis Abbott had advised her of but really just feels like she needs something more.   Spoke with Dr Carlis Abbott and called pt to offer the [place] that was commented on in his notes. She declined and said that she would rather wait till January to discuss possible surgery with him but did request that we work her in for a visit   Appt made   York Cerise, [place]

## 2019-10-03 ENCOUNTER — Ambulatory Visit (INDEPENDENT_AMBULATORY_CARE_PROVIDER_SITE_OTHER): Payer: Medicare Other | Admitting: Physician Assistant

## 2019-10-03 ENCOUNTER — Other Ambulatory Visit: Payer: Self-pay

## 2019-10-03 VITALS — BP 184/98 | HR 110 | Temp 95.1°F | Ht 63.0 in | Wt 113.0 lb

## 2019-10-03 DIAGNOSIS — I739 Peripheral vascular disease, unspecified: Secondary | ICD-10-CM | POA: Diagnosis not present

## 2019-10-03 DIAGNOSIS — Z720 Tobacco use: Secondary | ICD-10-CM

## 2019-10-03 NOTE — Progress Notes (Signed)
Established PAD   History of Present Illness   Tracy Davidson is a 78 y.o. (08/15/1941) female who presents to further discuss pain in BLE.  She was evaluated by Dr. Carlis Abbott in October of this year however states that her symptoms have worsened.  Her main complaint is burning tingling pain along the outside of her right leg extending from her lateral calf up to her hip and buttock.  She cannot describe any pattern when this pain occurs.  It can come on at rest as well as with movement.  She states over the past couple weeks she has noticed the same sensation starting in her left hip and lateral/posterior thigh however is not as intense.  She denies any classic claudication pain in her calves with ambulation.  ABIs performed in October demonstrate right ABI of 0.9 and left ABI of 1.  She is an everyday smoker.  She has not had any change in symptoms since starting Pletal in October.  She does have some lower back pain.  The patient's PMH, PSH, SH, and FamHx were reviewed and are unchanged from prior visit.  Current Outpatient Medications  Medication Sig Dispense Refill  . calcium carbonate (CALCIUM 600) 600 MG TABS tablet Take 600 mg by mouth 2 (two) times daily with a meal.    . Cholecalciferol (VITAMIN D) 50 MCG (2000 UT) CAPS Take 1 capsule by mouth daily.    . cilostazol (PLETAL) 100 MG tablet Take 1 tablet (100 mg total) by mouth 2 (two) times daily before a meal. 60 tablet 11  . lisinopril-hydrochlorothiazide (ZESTORETIC) 20-25 MG tablet TAKE 1 TABLET BY MOUTH EVERY DAY 90 tablet 0  . omeprazole (PRILOSEC) 20 MG capsule Take 1 capsule (20 mg total) by mouth daily. 30 capsule 3  . potassium chloride SA (KLOR-CON M20) 20 MEQ tablet Take 1 tablet (20 mEq total) by mouth daily. 90 tablet 2   No current facility-administered medications for this visit.    On ROS today: 10 system ROS is negative unless otherwise noted in HPI   Physical Examination   Vitals:   10/03/19 1457  BP: (!)  184/98  Pulse: (!) 110  Temp: (!) 95.1 F (35.1 C)  TempSrc: Temporal  SpO2: 96%  Weight: 113 lb (51.3 kg)  Height: 5\' 3"  (1.6 m)   Body mass index is 20.02 kg/m.  General Alert, O x 3, WD, NAD  Pulmonary Sym exp, good B air movt,   Cardiac RRR, Nl S1, S2  Vascular Vessel Right Left  Radial Palpable Palpable  Aorta Not palpable N/A  Femoral Palpable Palpable  Popliteal palpable palpable  PT Not palpable Faintly palpable  DP palpable ATA palpable ATA    Gastro- intestinal soft, non-distended, non-tender to palpation,   Musculo- skeletal M/S 5/5 throughout  , Extremities without ischemic changes  , No edema present, No visible varicosities , No Lipodermatosclerosis present  Neurologic Pain and light touch intact in extremities , Motor exam as listed above    Non-Invasive Vascular imaging   ABI (07/2019)  R:   ABI: 0.87  L:   ABI: 1.05    Medical Decision Making   Tracy Davidson is a 78 y.o. female who presents with BLE burning and tingling pain along lateral/posterior leg R more than L  Based on ABIs from prior visit and on physical exam I do not believe symptoms are related to arterial disease.  She has 2+ palpable femoral pulses bilaterally and 2+ palpable anterior tibial  artery pulses bilaterally.  Patient describes her pain with characteristics of burning and tingling along the posterior\lateral leg right more than left and are more likely neuropathic in nature.  Differential includes lumbar stenosis radiculopathy and sciatica.  She will be referred back to her primary care provider Dr. Volanda Napoleon for further work-up.  I discussed with the patient that aortogram is an invasive way of evaluating circulation.  She does not have a threatened limb.  I will further discuss utility of aortogram with Dr. Carlis Abbott however I believe it may be worthwhile to pursue other etiology prior to performing angiography.    Dagoberto Ligas PA-C [facility]  [place] Office: [phone]  Clinic MD: Donzetta Matters

## 2019-10-06 ENCOUNTER — Ambulatory Visit (INDEPENDENT_AMBULATORY_CARE_PROVIDER_SITE_OTHER): Payer: Medicare Other

## 2019-10-06 VITALS — Ht 63.0 in | Wt 112.0 lb

## 2019-10-06 DIAGNOSIS — Z0000 Encounter for general adult medical examination without abnormal findings: Secondary | ICD-10-CM

## 2019-10-06 NOTE — Progress Notes (Signed)
This visit is being conducted via phone call due to the COVID-19 pandemic. This patient has given me verbal consent via phone to conduct this visit, patient states they are participating from their home address. Some vital signs may be absent or patient reported.   Patient identification: identified by name, DOB, and current address.  Location provider: [facility], Office Persons participating in the virtual visit: Tracy Davidson and Tracy Forts, LPN.   Subjective:   Tracy Davidson is a 78 y.o. female who presents for Medicare Annual (Subsequent) preventive examination.  Tracy Davidson is complaining of chronic, worsening, BLE pain. She has recently seen vascular provider for this. She states the leg pain makes it difficult for her to walk/stand. She is not getting any physical exercise and continues to smoke. She stated she doesn't have much of an appetite and usually consumes only one meal daily and occasionally drinks Boost.   Review of Systems:   Cardiac Risk Factors include: advanced age (>52men, >57 women);smoking/ tobacco exposure;sedentary lifestyle;hypertension     Objective:     Vitals: Ht 5\' 3"  (1.6 m)   Wt 112 lb (50.8 kg)   BMI 19.84 kg/m   Body mass index is 19.84 kg/m.  Advanced Directives 10/06/2019 09/18/2018 08/16/2018 12/27/2017  Does Patient Have a Medical Advance Directive? No No No No  Would patient like information on creating a medical advance directive? No - Patient declined Yes (MAU/Ambulatory/Procedural Areas - Information given) Yes (MAU/Ambulatory/Procedural Areas - Information given) -    Tobacco Social History   Tobacco Use  Smoking Status Current Every Day Smoker  . Packs/day: 1.00  . Years: 50.00  . Pack years: 50.00  . Types: Cigarettes  Smokeless Tobacco Never Used     Ready to quit: Not Answered Counseling given: Not Answered   Clinical Intake:  Pre-visit preparation completed: Yes  Pain : 0-10 Pain Score: 7  Pain Type:  Chronic pain Pain Location: Leg Pain Descriptors / Indicators: Burning, Tingling     Nutritional Status: BMI of 19-24  Normal Nutritional Risks: None Diabetes: No  How often do you need to have someone help you when you read instructions, pamphlets, or other written materials from your doctor or pharmacy?: 1 - Never What is the last grade level you completed in school?: master's degree  Interpreter Needed?: No  Information entered by :: Tracy Forts, LPN.  Past Medical History:  Diagnosis Date  . ALLERGIC RHINITIS 09/20/2009  . Allergy   . Arthritis   . Cataract    both eyes, right worse thsn left  . COLONIC POLYPS, HX OF 06/06/2007  . DYSLIPIDEMIA 09/20/2009  . HYPERTENSION 06/06/2007  . Knee pain    stiff and pain on side  . LIVER FUNCTION TESTS, ABNORMAL, HX OF    feb or march 2019   Past Surgical History:  Procedure Laterality Date  . BREAST EXCISIONAL BIOPSY Left 1975  . BREAST SURGERY     breast bx  . COLONOSCOPY    . DILATION AND CURETTAGE OF UTERUS    . TONSILLECTOMY    . TOTAL ABDOMINAL HYSTERECTOMY     complete  . TRANSURETHRAL RESECTION OF BLADDER TUMOR N/A 09/18/2018   Procedure: RESTAGE TRANSURETHRAL RESECTION OF BLADDER TUMOR (TURBT);  Surgeon: Lucas Mallow, MD;  Location: [facility];  Service: Urology;  Laterality: N/A;  . TRANSURETHRAL RESECTION OF BLADDER TUMOR WITH MITOMYCIN-C N/A 08/16/2018   Procedure: TRANSURETHRAL RESECTION OF BLADDER TUMOR;  Surgeon: Marton Redwood III,  MD;  Location: [facility];  Service: Urology;  Laterality: N/A;   Family History  Problem Relation Age of Onset  . Colon cancer Neg Hx   . Colon polyps Neg Hx   . Esophageal cancer Neg Hx   . Rectal cancer Neg Hx   . Stomach cancer Neg Hx   . Pancreatic cancer Neg Hx   . Breast cancer Neg Hx    Social History   Socioeconomic History  . Marital status: Single    Spouse name: Not on file  . Number of children: 0  . Years of  education: 6 years college  . Highest education level: Master's degree (e.g., MA, MS, MEng, MEd, MSW, MBA)  Occupational History  . Occupation: retired  Tobacco Use  . Smoking status: Current Every Day Smoker    Packs/day: 1.00    Years: 50.00    Pack years: 50.00    Types: Cigarettes  . Smokeless tobacco: Never Used  Substance and Sexual Activity  . Alcohol use: Yes    Alcohol/week: 2.0 standard drinks    Types: 2 Shots of liquor per week    Comment: daily 2 -3 per day  . Drug use: No  . Sexual activity: Not on file  Other Topics Concern  . Not on file  Social History Narrative  . Not on file   Social Determinants of Health   Financial Resource Strain: Low Risk   . Difficulty of Paying Living Expenses: Not hard at all  Food Insecurity: No Food Insecurity  . Worried About Charity fundraiser in the Last Year: Never true  . Ran Out of Food in the Last Year: Never true  Transportation Needs:   . Lack of Transportation (Medical): Not on file  . Lack of Transportation (Non-Medical): Not on file  Physical Activity:   . Days of Exercise per Week: Not on file  . Minutes of Exercise per Session: Not on file  Stress:   . Feeling of Stress : Not on file  Social Connections:   . Frequency of Communication with Friends and Family: Not on file  . Frequency of Social Gatherings with Friends and Family: Not on file  . Attends Religious Services: Not on file  . Active Member of Clubs or Organizations: Not on file  . Attends Archivist Meetings: Not on file  . Marital Status: Not on file    Outpatient Encounter Medications as of 10/06/2019  Medication Sig  . calcium carbonate (CALCIUM 600) 600 MG TABS tablet Take 600 mg by mouth 2 (two) times daily with a meal.  . Cholecalciferol (VITAMIN D) 50 MCG (2000 UT) CAPS Take 1 capsule by mouth daily.  . cilostazol (PLETAL) 100 MG tablet Take 1 tablet (100 mg total) by mouth 2 (two) times daily before a meal.  .  lisinopril-hydrochlorothiazide (ZESTORETIC) 20-25 MG tablet TAKE 1 TABLET BY MOUTH EVERY DAY  . omeprazole (PRILOSEC) 20 MG capsule Take 1 capsule (20 mg total) by mouth daily.  . potassium chloride SA (KLOR-CON M20) 20 MEQ tablet Take 1 tablet (20 mEq total) by mouth daily.   No facility-administered encounter medications on file as of 10/06/2019.    Activities of Daily Living In your present state of health, do you have any difficulty performing the following activities: 10/06/2019  Hearing? N  Vision? N  Difficulty concentrating or making decisions? N  Walking or climbing stairs? N  Dressing or bathing? N  Preparing Food and eating ? N  Using the Toilet? N  In the past six months, have you accidently leaked urine? N  Do you have problems with loss of bowel control? N  Managing your Medications? N  Managing your Finances? N  Housekeeping or managing your Housekeeping? N  Some recent data might be hidden    Patient Care Team: Billie Ruddy, MD as PCP - General (Family Medicine)    Assessment:   This is a routine wellness examination for Kaydynce.  Exercise Activities and Dietary recommendations Current Exercise Habits: The patient does not participate in regular exercise at present, Exercise limited by: Other - see comments(PVD pain in legs)  Goals    . Have 3 meals a day     Try to eat 3 meals per day; even if they are small.        Fall Risk Fall Risk  10/06/2019 12/08/2015 10/30/2013  Falls in the past year? - No Yes  Number falls in past yr: 1 - 2 or more  Injury with Fall? 0 - -  Risk for fall due to : History of fall(s);Medication side effect - Impaired balance/gait  Follow up Falls evaluation completed;Education provided;Falls prevention discussed - -   Is the patient's home free of loose throw rugs in walkways, pet beds, electrical cords, etc?   yes      Grab bars in the bathroom? yes      Handrails on the stairs?   yes      Adequate lighting?   yes  Timed  Get Up and Go performed: N/A due to telephone visit.  Depression Screen PHQ 2/9 Scores 10/06/2019 12/08/2015 10/30/2013  PHQ - 2 Score 0 0 0     Cognitive Function     6CIT Screen 10/06/2019  What Year? 0 points  What month? 0 points  What time? 0 points  Count back from 20 0 points  Months in reverse 0 points  Repeat phrase 0 points  Total Score 0    Immunization History  Administered Date(s) Administered  . Influenza Split 08/24/2013  . Influenza Whole 08/16/1997  . Influenza, High Dose Seasonal PF 10/24/2017, 08/21/2018, 07/29/2019, 07/29/2019  . Influenza-Unspecified 09/06/2014, 08/07/2015, 08/21/2018  . Pneumococcal Conjugate-13 10/30/2013  . Pneumococcal Polysaccharide-23 10/16/2008  . Tdap 03/14/2011    Qualifies for Shingles Vaccine?Yes, patient instructed to check with her pharmacy for her out of pocket costs as this will be her most affordable option.  Screening Tests Health Maintenance  Topic Date Due  . MAMMOGRAM  10/25/2019  . TETANUS/TDAP  03/13/2021  . INFLUENZA VACCINE  Completed  . DEXA SCAN  Completed  . PNA vac Low Risk Adult  Completed    Cancer Screenings: Lung: Low Dose CT Chest recommended if Age 32-80 years, 30 pack-year currently smoking OR have quit w/in 15years. Patient does not qualify. Breast:  Up to date on Mammogram? Yes   Up to date of Bone Density/Dexa? Yes Colorectal: yes and no longer medically necessary.  Additional Screenings:  Hepatitis C Screening: N/A due to her age.     Plan:   Ms. Waldrip was encouraged to try to increase eating to  3 small meals daily and to continue drinking Boost. Smoking cessation encouraged. She is scheduled for a mammogram in late January 2021. Appointment was scheduled with Dr. Volanda Napoleon for a physical + labs. Vascular suggested she follow up with Dr. Volanda Napoleon for her leg pain.  I have personally reviewed and noted the following in the patient's chart:   .  Medical and social history . Use of alcohol,  tobacco or illicit drugs  . Current medications and supplements . Functional ability and status . Nutritional status . Physical activity . Advanced directives . List of other physicians . Hospitalizations, surgeries, and ER visits in previous 12 months . Vitals . Screenings to include cognitive, depression, and falls . Referrals and appointments  In addition, I have reviewed and discussed with patient certain preventive protocols, quality metrics, and best practice recommendations. A written personalized care plan for preventive services as well as general preventive health recommendations were provided to patient.     Tracy Forts, LPN  [id number]

## 2019-10-06 NOTE — Patient Instructions (Addendum)
[name] , Thank you for taking time to participate in your Medicare Wellness Visit. I appreciate your ongoing commitment to your health goals. Please review the following plan we discussed and let me know if I can assist you in the future.   Screening recommendations/referrals: Colorectal Screening: completed 12/27/2017. Not necessary to have another one. Mammogram: next one scheduled 11/12/2019. Bone Density: completed on 05/07/2019. Currently up to date.  Vision and Dental Exams: Recommended annual ophthalmology exams for early detection of glaucoma and other disorders of the eye Recommended annual dental exams for proper oral hygiene  Diabetic Exams: Diabetic Eye Exam: N/A Diabetic Foot Exam: N/A  Vaccinations: Influenza vaccine: completed on 07/29/2019; repeat in Fall 2021. Pneumococcal vaccine: completed 10/30/2013; currently up to date. Tdap vaccine: completed on 03/14/2011. Due again around 03/13/2021.  Shingles vaccine: Please contact your pharmacy to determine your out of pocket expense for the Shingrix vaccine. This is a series of two injections that can be given 2-6 months apart. Advanced directives: Advance directives discussed with you today. Advance directives discussed with you today. You have declined to receive documents for completion. Goals: Recommend to drink at least 6-8 8oz glasses of water per day. Try to eat 3 meals per day; even if they are small.  Recommend to remove any items from the home that may cause slips or trips.  Recommend to begin DASH diet (low sodium)as directed below  Recommend to continue efforts to reduce smoking habits until no longer smoking. Smoking Cessation literature is attached below.  Next appointment: Please schedule your Annual Wellness Visit with your Nurse Health Advisor in one year.  Preventive Care 40 Years and Older, Female Preventive care refers to lifestyle choices and visits with your health care provider that can promote  health and wellness. What does preventive care include?  A yearly physical exam. This is also called an annual well check.  Dental exams once or twice a year.  Routine eye exams. Ask your health care provider how often you should have your eyes checked.  Personal lifestyle choices, including:  Daily care of your teeth and gums.  Regular physical activity.  Eating a healthy diet.  Avoiding tobacco and drug use.  Limiting alcohol use.  Practicing safe sex.  Taking low-dose aspirin every day if recommended by your health care provider.  Taking vitamin and mineral supplements as recommended by your health care provider. What happens during an annual well check? The services and screenings done by your health care provider during your annual well check will depend on your age, overall health, lifestyle risk factors, and family history of disease. Counseling  Your health care provider may ask you questions about your:  Alcohol use.  Tobacco use.  Drug use.  Emotional well-being.  Home and relationship well-being.  Sexual activity.  Eating habits.  History of falls.  Memory and ability to understand (cognition).  Work and work Statistician.  Reproductive health. Screening  You may have the following tests or measurements:  Height, weight, and BMI.  Blood pressure.  Lipid and cholesterol levels. These may be checked every 5 years, or more frequently if you are over 21 years old.  Skin check.  Lung cancer screening. You may have this screening every year starting at age 38 if you have a 30-pack-year history of smoking and currently smoke or have quit within the past 15 years.  Fecal occult blood test (FOBT) of the stool. You may have this test every year starting at age 20.  Flexible sigmoidoscopy or colonoscopy. You may have a sigmoidoscopy every 5 years or a colonoscopy every 10 years starting at age 84.  Hepatitis C blood test.  Hepatitis B blood  test.  Sexually transmitted disease (STD) testing.  Diabetes screening. This is done by checking your blood sugar (glucose) after you have not eaten for a while (fasting). You may have this done every 1-3 years.  Bone density scan. This is done to screen for osteoporosis. You may have this done starting at age 40.  Mammogram. This may be done every 1-2 years. Talk to your health care provider about how often you should have regular mammograms. Talk with your health care provider about your test results, treatment options, and if necessary, the need for more tests. Vaccines  Your health care provider may recommend certain vaccines, such as:  Influenza vaccine. This is recommended every year.  Tetanus, diphtheria, and acellular pertussis (Tdap, Td) vaccine. You may need a Td booster every 10 years.  Zoster vaccine. You may need this after age 46.  Pneumococcal 13-valent conjugate (PCV13) vaccine. One dose is recommended after age 49.  Pneumococcal polysaccharide (PPSV23) vaccine. One dose is recommended after age 82. Talk to your health care provider about which screenings and vaccines you need and how often you need them. This information is not intended to replace advice given to you by your health care provider. Make sure you discuss any questions you have with your health care provider. Document Released: 10/29/2015 Document Revised: 06/21/2016 Document Reviewed: 08/03/2015 Elsevier Interactive Patient Education  2017 [employer] Prevention in the Home Falls can cause injuries. They can happen to people of all ages. There are many things you can do to make your home safe and to help prevent falls. What can I do on the outside of my home?  Regularly fix the edges of walkways and driveways and fix any cracks.  Remove anything that might make you trip as you walk through a door, such as a raised step or threshold.  Trim any bushes or trees on the path to your home.  Use  bright outdoor lighting.  Clear any walking paths of anything that might make someone trip, such as rocks or tools.  Regularly check to see if handrails are loose or broken. Make sure that both sides of any steps have handrails.  Any raised decks and porches should have guardrails on the edges.  Have any leaves, snow, or ice cleared regularly.  Use sand or salt on walking paths during winter.  Clean up any spills in your garage right away. This includes oil or grease spills. What can I do in the bathroom?  Use night lights.  Install grab bars by the toilet and in the tub and shower. Do not use towel bars as grab bars.  Use non-skid mats or decals in the tub or shower.  If you need to sit down in the shower, use a plastic, non-slip stool.  Keep the floor dry. Clean up any water that spills on the floor as soon as it happens.  Remove soap buildup in the tub or shower regularly.  Attach bath mats securely with double-sided non-slip rug tape.  Do not have throw rugs and other things on the floor that can make you trip. What can I do in the bedroom?  Use night lights.  Make sure that you have a light by your bed that is easy to reach.  Do not use any sheets or blankets  that are too big for your bed. They should not hang down onto the floor.  Have a firm chair that has side arms. You can use this for support while you get dressed.  Do not have throw rugs and other things on the floor that can make you trip. What can I do in the kitchen?  Clean up any spills right away.  Avoid walking on wet floors.  Keep items that you use a lot in easy-to-reach places.  If you need to reach something above you, use a strong step stool that has a grab bar.  Keep electrical cords out of the way.  Do not use floor polish or wax that makes floors slippery. If you must use wax, use non-skid floor wax.  Do not have throw rugs and other things on the floor that can make you trip. What can  I do with my stairs?  Do not leave any items on the stairs.  Make sure that there are handrails on both sides of the stairs and use them. Fix handrails that are broken or loose. Make sure that handrails are as long as the stairways.  Check any carpeting to make sure that it is firmly attached to the stairs. Fix any carpet that is loose or worn.  Avoid having throw rugs at the top or bottom of the stairs. If you do have throw rugs, attach them to the floor with carpet tape.  Make sure that you have a light switch at the top of the stairs and the bottom of the stairs. If you do not have them, ask someone to add them for you. What else can I do to help prevent falls?  Wear shoes that:  Do not have high heels.  Have rubber bottoms.  Are comfortable and fit you well.  Are closed at the toe. Do not wear sandals.  If you use a stepladder:  Make sure that it is fully opened. Do not climb a closed stepladder.  Make sure that both sides of the stepladder are locked into place.  Ask someone to hold it for you, if possible.  Clearly mark and make sure that you can see:  Any grab bars or handrails.  First and last steps.  Where the edge of each step is.  Use tools that help you move around (mobility aids) if they are needed. These include:  Canes.  Walkers.  Scooters.  Crutches.  Turn on the lights when you go into a dark area. Replace any light bulbs as soon as they burn out.  Set up your furniture so you have a clear path. Avoid moving your furniture around.  If any of your floors are uneven, fix them.  If there are any pets around you, be aware of where they are.  Review your medicines with your doctor. Some medicines can make you feel dizzy. This can increase your chance of falling. Ask your doctor what other things that you can do to help prevent falls. This information is not intended to replace advice given to you by your health care provider. Make sure you  discuss any questions you have with your health care provider. Document Released: 07/29/2009 Document Revised: 03/09/2016 Document Reviewed: 11/06/2014 Elsevier Interactive Patient Education  2017 [employer].

## 2019-10-16 ENCOUNTER — Other Ambulatory Visit: Payer: Self-pay | Admitting: Family Medicine

## 2019-10-16 DIAGNOSIS — I10 Essential (primary) hypertension: Secondary | ICD-10-CM

## 2019-10-28 DIAGNOSIS — C678 Malignant neoplasm of overlapping sites of bladder: Secondary | ICD-10-CM | POA: Diagnosis not present

## 2019-10-28 DIAGNOSIS — C679 Malignant neoplasm of bladder, unspecified: Secondary | ICD-10-CM | POA: Diagnosis not present

## 2019-11-04 ENCOUNTER — Encounter (HOSPITAL_COMMUNITY): Payer: Medicare Other

## 2019-11-04 ENCOUNTER — Ambulatory Visit: Payer: Medicare Other | Admitting: Vascular Surgery

## 2019-11-04 DIAGNOSIS — C678 Malignant neoplasm of overlapping sites of bladder: Secondary | ICD-10-CM | POA: Diagnosis not present

## 2019-11-12 ENCOUNTER — Other Ambulatory Visit: Payer: Self-pay

## 2019-11-12 ENCOUNTER — Ambulatory Visit
Admission: RE | Admit: 2019-11-12 | Discharge: 2019-11-12 | Disposition: A | Discharge status: Home / Self Care | Payer: Medicare Other | Source: Ambulatory Visit | Attending: Family Medicine | Admitting: Family Medicine

## 2019-11-12 DIAGNOSIS — Z1231 Encounter for screening mammogram for malignant neoplasm of breast: Secondary | ICD-10-CM

## 2019-11-13 ENCOUNTER — Other Ambulatory Visit: Payer: Self-pay | Admitting: Family Medicine

## 2019-11-13 ENCOUNTER — Encounter: Payer: Self-pay | Admitting: Family Medicine

## 2019-11-13 DIAGNOSIS — K219 Gastro-esophageal reflux disease without esophagitis: Secondary | ICD-10-CM

## 2019-11-13 LAB — HM MAMMOGRAPHY

## 2019-11-17 DIAGNOSIS — B029 Zoster without complications: Secondary | ICD-10-CM

## 2019-11-17 HISTORY — DX: Zoster without complications: B02.9

## 2019-11-24 ENCOUNTER — Other Ambulatory Visit: Payer: Self-pay

## 2019-11-24 ENCOUNTER — Emergency Department (HOSPITAL_COMMUNITY): Payer: Medicare Other

## 2019-11-24 ENCOUNTER — Emergency Department (HOSPITAL_COMMUNITY)
Admission: EM | Admit: 2019-11-24 | Discharge: 2019-11-25 | Disposition: A | Discharge status: Home / Self Care | Payer: Medicare Other | Attending: Emergency Medicine | Admitting: Emergency Medicine

## 2019-11-24 ENCOUNTER — Telehealth: Payer: Self-pay | Admitting: Family Medicine

## 2019-11-24 ENCOUNTER — Encounter (HOSPITAL_COMMUNITY): Payer: Self-pay | Admitting: Emergency Medicine

## 2019-11-24 DIAGNOSIS — I10 Essential (primary) hypertension: Secondary | ICD-10-CM | POA: Diagnosis not present

## 2019-11-24 DIAGNOSIS — R000 Tachycardia, unspecified: Secondary | ICD-10-CM | POA: Diagnosis not present

## 2019-11-24 DIAGNOSIS — R21 Rash and other nonspecific skin eruption: Secondary | ICD-10-CM | POA: Diagnosis present

## 2019-11-24 DIAGNOSIS — B029 Zoster without complications: Secondary | ICD-10-CM

## 2019-11-24 DIAGNOSIS — Z79899 Other long term (current) drug therapy: Secondary | ICD-10-CM | POA: Diagnosis not present

## 2019-11-24 DIAGNOSIS — R0602 Shortness of breath: Secondary | ICD-10-CM | POA: Diagnosis not present

## 2019-11-24 DIAGNOSIS — F17210 Nicotine dependence, cigarettes, uncomplicated: Secondary | ICD-10-CM | POA: Diagnosis not present

## 2019-11-24 DIAGNOSIS — I739 Peripheral vascular disease, unspecified: Secondary | ICD-10-CM | POA: Diagnosis not present

## 2019-11-24 LAB — CBC
HCT: 59.1 % — ABNORMAL HIGH (ref 36.0–46.0)
Hemoglobin: 19.8 g/dL — ABNORMAL HIGH (ref 12.0–15.0)
MCH: 34.4 pg — ABNORMAL HIGH (ref 26.0–34.0)
MCHC: 33.5 g/dL (ref 30.0–36.0)
MCV: 102.8 fL — ABNORMAL HIGH (ref 80.0–100.0)
Platelets: 159 10*3/uL (ref 150–400)
RBC: 5.75 MIL/uL — ABNORMAL HIGH (ref 3.87–5.11)
RDW: 12.2 % (ref 11.5–15.5)
WBC: 8 10*3/uL (ref 4.0–10.5)
nRBC: 0 % (ref 0.0–0.2)

## 2019-11-24 LAB — TROPONIN I (HIGH SENSITIVITY)
Troponin I (High Sensitivity): 5 ng/L (ref <18)
Troponin I (High Sensitivity): 5 ng/L (ref <18)

## 2019-11-24 LAB — BASIC METABOLIC PANEL
Anion gap: 13 (ref 5–15)
BUN: 11 mg/dL (ref 8–23)
CO2: 30 mmol/L (ref 22–32)
Calcium: 11 mg/dL — ABNORMAL HIGH (ref 8.9–10.3)
Chloride: 96 mmol/L — ABNORMAL LOW (ref 98–111)
Creatinine, Ser: 0.69 mg/dL (ref 0.44–1.00)
GFR calc Af Amer: >60 mL/min (ref >60)
GFR calc non Af Amer: >60 mL/min (ref >60)
Glucose, Bld: 86 mg/dL (ref 70–99)
Potassium: 3.2 mmol/L — ABNORMAL LOW (ref 3.5–5.1)
Sodium: 139 mmol/L (ref 135–145)

## 2019-11-24 MED ORDER — VALACYCLOVIR HCL 1 G PO TABS: 1000.0000 mg | ORAL_TABLET | Freq: Three times a day (TID) | ORAL | 0 refills | Status: DC

## 2019-11-24 MED ORDER — DEXAMETHASONE 4 MG PO TABS
10.0000 mg | ORAL_TABLET | Freq: Once | ORAL | Status: AC
  Administered 2019-11-25: 10 mg via ORAL
  Filled 2019-11-24: qty 3

## 2019-11-24 MED ORDER — SODIUM CHLORIDE 0.9% FLUSH: 3.0000 mL | Freq: Once | INTRAVENOUS | Status: DC

## 2019-11-24 NOTE — ED Triage Notes (Signed)
Pt endorses pain to left shoulder. Pt has rash up arm to her back and neck with open blisters. Pt reports some mild SOB but is a smoker.

## 2019-11-24 NOTE — Discharge Instructions (Addendum)
Follow up with your doctor.  Return for spreading of the rash or fever.  Take the medication as prescribed.  Follow-up with your family doctor.

## 2019-11-25 ENCOUNTER — Ambulatory Visit: Payer: Medicare Other | Admitting: Family Medicine

## 2019-11-25 ENCOUNTER — Telehealth: Payer: Self-pay | Admitting: Family Medicine

## 2019-11-25 NOTE — ED Provider Notes (Signed)
[facility] DEPARTMENT Provider Note   CSN: [id number] Arrival date & time: 11/24/19  1702     History Chief Complaint  Patient presents with  . Arm Pain    Tracy Davidson is a 79 y.o. female.  79 yo F with a chief complaints of a rash.  Noticed about 3 to 4 days ago.  Tells me that its not painful but told someone in triage that it was.  No fevers.  Unsure if she had chickenpox in the past.  Rash goes from the left side of her neck down her arm.  Not affecting the other side.  The history is provided by the patient.  Arm Pain Pertinent negatives include no chest pain, no headaches and no shortness of breath.  Illness Severity:  Moderate Onset quality:  Gradual Duration:  3 days Timing:  Constant Progression:  Worsening Chronicity:  New Associated symptoms: rash   Associated symptoms: no chest pain, no congestion, no fever, no headaches, no myalgias, no nausea, no rhinorrhea, no shortness of breath, no vomiting and no wheezing        Past Medical History:  Diagnosis Date  . ALLERGIC RHINITIS 09/20/2009  . Allergy   . Arthritis   . Cataract    both eyes, right worse thsn left  . COLONIC POLYPS, HX OF 06/06/2007  . DYSLIPIDEMIA 09/20/2009  . HYPERTENSION 06/06/2007  . Knee pain    stiff and pain on side  . LIVER FUNCTION TESTS, ABNORMAL, HX OF    feb or march 2019    Patient Active Problem List   Diagnosis Date Noted  . PAD (peripheral artery disease) ([place]) 07/29/2019  . Tobacco abuse 03/14/2012  . LIVER FUNCTION TESTS, ABNORMAL, HX OF 09/21/2009  . Dyslipidemia 09/20/2009  . ALLERGIC RHINITIS 09/20/2009  . Essential hypertension 06/06/2007  . COLONIC POLYPS, HX OF 06/06/2007    Past Surgical History:  Procedure Laterality Date  . BREAST EXCISIONAL BIOPSY Left 1975  . BREAST SURGERY     breast bx  . COLONOSCOPY    . DILATION AND CURETTAGE OF UTERUS    . TONSILLECTOMY    . TOTAL ABDOMINAL HYSTERECTOMY     complete  .  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 TRANSURETHRAL RESECTION OF BLADDER TUMOR WITH MITOMYCIN-C N/A 08/16/2018   Procedure: TRANSURETHRAL RESECTION OF BLADDER TUMOR;  Surgeon: Lucas Mallow, MD;  Location: [facility];  Service: Urology;  Laterality: N/A;     OB History   No obstetric history on file.     Family History  Problem Relation Age of Onset  . Colon cancer Neg Hx   . Colon polyps Neg Hx   . Esophageal cancer Neg Hx   . Rectal cancer Neg Hx   . Stomach cancer Neg Hx   . Pancreatic cancer Neg Hx   . Breast cancer Neg Hx     Social History   Tobacco Use  . Smoking status: Current Every Day Smoker    Packs/day: 1.00    Years: 50.00    Pack years: 50.00    Types: Cigarettes  . Smokeless tobacco: Never Used  Substance Use Topics  . Alcohol use: Yes    Alcohol/week: 2.0 standard drinks    Types: 2 Shots of liquor per week  Comment: daily 2 -3 per day  . Drug use: No    Home Medications Prior to Admission medications   Medication Sig Start Date End Date Taking? Authorizing Provider  calcium carbonate (CALCIUM 600) 600 MG TABS tablet Take 600 mg by mouth 2 (two) times daily with a meal.    [provider]  Cholecalciferol (VITAMIN D) 50 MCG (2000 UT) CAPS Take 1 capsule by mouth daily.    [provider]  cilostazol (PLETAL) 100 MG tablet Take 1 tablet (100 mg total) by mouth 2 (two) times daily before a meal. 07/29/19   Marty Heck, MD  lisinopril-hydrochlorothiazide (ZESTORETIC) 20-25 MG tablet TAKE 1 TABLET BY MOUTH EVERY DAY 10/16/19   Billie Ruddy, MD  omeprazole (PRILOSEC) 20 MG capsule TAKE 1 CAPSULE BY MOUTH EVERY DAY 11/13/19   Billie Ruddy, MD  potassium chloride SA (KLOR-CON M20) 20 MEQ tablet Take 1 tablet  (20 mEq total) by mouth daily. 04/10/19   Billie Ruddy, MD  valACYclovir (VALTREX) 1000 MG tablet Take 1 tablet (1,000 mg total) by mouth 3 (three) times daily. 11/24/19   Deno Etienne, DO    Allergies    Doxycycline  Review of Systems   Review of Systems  Constitutional: Negative for chills and fever.  HENT: Negative for congestion and rhinorrhea.   Eyes: Negative for redness and visual disturbance.  Respiratory: Negative for shortness of breath and wheezing.   Cardiovascular: Negative for chest pain and palpitations.  Gastrointestinal: Negative for nausea and vomiting.  Genitourinary: Negative for dysuria and urgency.  Musculoskeletal: Negative for arthralgias and myalgias.  Skin: Positive for rash. Negative for pallor and wound.  Neurological: Negative for dizziness and headaches.    Physical Exam Updated Vital Signs BP (!) 156/84 (BP Location: Left Arm)   Pulse (!) 102   Temp 97.8 F (36.6 C) (Oral)   Resp 17   Ht 5\' 3"  (1.6 m)   Wt 49.9 kg   SpO2 96%   BMI 19.49 kg/m   Physical Exam Vitals and nursing note reviewed.  Constitutional:      General: She is not in acute distress.    Appearance: She is well-developed. She is not diaphoretic.  HENT:     Head: Normocephalic and atraumatic.  Eyes:     Pupils: Pupils are equal, round, and reactive to light.  Cardiovascular:     Rate and Rhythm: Normal rate and regular rhythm.     Heart sounds: No murmur. No friction rub. No gallop.   Pulmonary:     Effort: Pulmonary effort is normal.     Breath sounds: No wheezing or rales.  Abdominal:     General: There is no distension.     Palpations: Abdomen is soft.     Tenderness: There is no abdominal tenderness.  Musculoskeletal:        General: No tenderness.     Cervical back: Normal range of motion and neck supple.  Skin:    General: Skin is warm and dry.     Findings: Rash present.     Comments: Vesicular rash not crossing midline on the left arm starting at the base  of the neck.  Neurological:     Mental Status: She is alert and oriented to person, place, and time.  Psychiatric:        Behavior: Behavior normal.     ED Results / Procedures / Treatments   Labs (all labs ordered are listed, but only abnormal results  are displayed) Labs Reviewed  BASIC METABOLIC PANEL - Abnormal; Notable for the following components:      Result Value   Potassium 3.2 (*)    Chloride 96 (*)    Calcium 11.0 (*)    All other components within normal limits  CBC - Abnormal; Notable for the following components:   RBC 5.75 (*)    Hemoglobin 19.8 (*)    HCT 59.1 (*)    MCV 102.8 (*)    MCH 34.4 (*)    All other components within normal limits  TROPONIN I (HIGH SENSITIVITY)  TROPONIN I (HIGH SENSITIVITY)    EKG EKG Interpretation  Date/Time:  Monday November 24 2019 17:29:24 EST Ventricular Rate:  111 PR Interval:  156 QRS Duration: 64 QT Interval:  332 QTC Calculation: 451 R Axis:   65 Text Interpretation: Sinus tachycardia Right atrial enlargement Cannot rule out Anterior infarct , age undetermined Abnormal ECG Since last tracing rate faster Otherwise no significant change Confirmed by Deno Etienne [phone]) on 11/24/2019 11:18:29 PM   Radiology DG Chest 2 View  Result Date: 11/24/2019 CLINICAL DATA:  Tobacco abuse, short of breath, left arm rash for 3 days EXAM: CHEST - 2 VIEW COMPARISON:  None. FINDINGS: The heart size and mediastinal contours are within normal limits. Both lungs are clear. The visualized skeletal structures are unremarkable. IMPRESSION: No active cardiopulmonary disease. Electronically Signed   By: Randa Ngo M.D.   On: 11/24/2019 18:04    Procedures Procedures (including critical care time)  Medications Ordered in ED Medications  sodium chloride flush (NS) 0.9 % injection 3 mL (has no administration in time range)  dexamethasone (DECADRON) tablet 10 mg (has no administration in time range)    ED Course  I have reviewed the  triage vital signs and the nursing notes.  Pertinent labs & imaging results that were available during my care of the patient were reviewed by me and considered in my medical decision making (see chart for details).    MDM Rules/Calculators/A&P                      79 yo F with a chief complaints of a rash.  Clinically the patient has shingles.  I am unsure why she had a work-up done in triage.  Her troponin is negative.  Chest x-ray was performed viewed by me negative for focal infiltrate.  She is mildly tachycardic which I suspect is due to pain.  Will start on acyclovir.  Give a dose of Decadron.  PCP follow-up.  12:04 AM:  I have discussed the diagnosis/risks/treatment options with the patient and believe the pt to be eligible for discharge home to follow-up with PCP. We also discussed returning to the ED immediately if new or worsening sx occur. We discussed the sx which are most concerning (e.g., sudden worsening pain, fever, inability to tolerate by mouth) that necessitate immediate return. Medications administered to the patient during their visit and any new prescriptions provided to the patient are listed below.  Medications given during this visit Medications  sodium chloride flush (NS) 0.9 % injection 3 mL (has no administration in time range)  dexamethasone (DECADRON) tablet 10 mg (has no administration in time range)     The patient appears reasonably screen and/or stabilized for discharge and I doubt any other medical condition or other [facility] requiring further screening, evaluation, or treatment in the ED at this time prior to discharge.   Final Clinical Impression(s) /  ED Diagnoses Final diagnoses:  Herpes zoster without complication    Rx / DC Orders ED Discharge Orders         Ordered    valACYclovir (VALTREX) 1000 MG tablet  3 times daily     11/24/19 2357           Deno Etienne, DO 11/25/19 0004

## 2019-11-25 NOTE — Telephone Encounter (Signed)
Pt was seen in [facility] ER and was diagnosed with shingles they prescribed her vlacyclovir. She wants to know is there anything else she needs to do or not do.     Patient Phone: [phone]

## 2019-11-25 NOTE — Telephone Encounter (Signed)
FYI  Spoke with pt advised to take medication prescribed at the ED, Pt also advised to say hydrated and if still not feeling better after completing the 7 days dose of antibiotic to contact our office for further evaluation

## 2019-11-25 NOTE — ED Notes (Signed)
Discharge instructions reviewed with pt. Pt verbalized understanding.   

## 2019-11-26 NOTE — Telephone Encounter (Signed)
Patient called today wanting to know if it will be okay to still have her 2nd COVID vaccine on Tuesday since she was diagnosed with Shingles on Monday at the ER.  The patient also has an appointment on 12/04/2019 with Dr. Volanda Napoleon for her physical.  Please advise

## 2019-11-26 NOTE — Telephone Encounter (Signed)
Pt should postpone 2nd COVID vaccine until resolution of the rash.

## 2019-11-26 NOTE — Telephone Encounter (Signed)
Spoke with pt verbalized understanding of Dr Volanda Napoleon recommendation.

## 2019-11-26 NOTE — Telephone Encounter (Signed)
Please advise 

## 2019-12-03 ENCOUNTER — Other Ambulatory Visit: Payer: Self-pay

## 2019-12-04 ENCOUNTER — Encounter: Payer: Self-pay | Admitting: Family Medicine

## 2019-12-09 ENCOUNTER — Encounter: Payer: Self-pay | Admitting: Vascular Surgery

## 2019-12-09 ENCOUNTER — Other Ambulatory Visit: Payer: Self-pay

## 2019-12-09 ENCOUNTER — Ambulatory Visit (INDEPENDENT_AMBULATORY_CARE_PROVIDER_SITE_OTHER): Payer: Medicare Other | Admitting: Vascular Surgery

## 2019-12-09 ENCOUNTER — Ambulatory Visit (HOSPITAL_COMMUNITY)
Admission: RE | Admit: 2019-12-09 | Discharge: 2019-12-09 | Disposition: A | Discharge status: Home / Self Care | Payer: Medicare Other | Source: Ambulatory Visit | Attending: Vascular Surgery | Admitting: Vascular Surgery

## 2019-12-09 VITALS — BP 150/91 | HR 115 | Temp 97.3°F | Resp 14 | Ht 63.0 in | Wt 109.0 lb

## 2019-12-09 DIAGNOSIS — I739 Peripheral vascular disease, unspecified: Secondary | ICD-10-CM | POA: Diagnosis not present

## 2019-12-09 NOTE — Progress Notes (Signed)
Virtual Visit via Telephone Note  I connected with Tracy Davidson on 12/09/2019 using the [employer] by telephone and verified that I was speaking with the correct person using two identifiers. P   The limitations of evaluation and management by telemedicine and the availability of in person appointments have been previously discussed with the patient and are documented in the patients chart. The patient expressed understanding and consented to proceed.  PCP: Billie Ruddy, MD   Chief Complaint: Follow-up for PAD  History of Present Illness: Tracy Davidson is a 79 y.o. female with with history of hypertension and dyslipidemia that presents for follow-up on the phone to discuss her right lower extremity symptoms.  She was seen initially in October last year and had 3 to 4 months of symptoms in the right leg described as cramping in the calf and other vague symptoms.  She only had mildly abnormal ABIs and we elected conservative management.  I did trial Pletal with her.  On follow-up today she states no improvement but things are not worse.  She feels like there is pain that radiates down the leg and then comes back up the leg even when she is walking and sitting still.  No tissue loss or other wounds.  Pain is same walking or sitting.  She does smoke a pack a day.  She was seen in follow-up by PA in December with the same vague symptoms and referred back to her PCP to rule out neuropathy and other etiologies but states that the visit was delayed.     Past Medical History:  Diagnosis Date  . ALLERGIC RHINITIS 09/20/2009  . Allergy   . Arthritis   . Cataract    both eyes, right worse thsn left  . COLONIC POLYPS, HX OF 06/06/2007  . DYSLIPIDEMIA 09/20/2009  . HYPERTENSION 06/06/2007  . Knee pain    stiff and pain on side  . LIVER FUNCTION TESTS, ABNORMAL, HX OF    feb or march 2019    Past Surgical History:  Procedure Laterality Date  . BREAST EXCISIONAL BIOPSY Left 1975  .  BREAST SURGERY     breast bx  . COLONOSCOPY    . DILATION AND CURETTAGE OF UTERUS    . TONSILLECTOMY    . TOTAL ABDOMINAL HYSTERECTOMY     complete  . 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 TRANSURETHRAL RESECTION OF BLADDER TUMOR WITH MITOMYCIN-C N/A 08/16/2018   Procedure: TRANSURETHRAL RESECTION OF BLADDER TUMOR;  Surgeon: Lucas Mallow, MD;  Location: [facility];  Service: Urology;  Laterality: N/A;    Current Meds  Medication Sig  . calcium carbonate (CALCIUM 600) 600 MG TABS tablet Take 600 mg by mouth 2 (two) times daily with a meal.  . Cholecalciferol (VITAMIN D) 50 MCG (2000 UT) CAPS Take 1 capsule by mouth daily.  . cilostazol (PLETAL) 100 MG tablet Take 1 tablet (100 mg total) by mouth 2 (two) times daily before a meal.  . lisinopril-hydrochlorothiazide (ZESTORETIC) 20-25 MG tablet TAKE 1 TABLET BY MOUTH EVERY DAY  . omeprazole (PRILOSEC) 20 MG capsule TAKE 1 CAPSULE BY MOUTH EVERY DAY  . potassium chloride SA (KLOR-CON M20) 20 MEQ tablet Take 1 tablet (20 mEq total) by mouth daily.    12 system ROS was  negative unless otherwise noted in HPI   Observations/Objective:  ABIs today are 0.75 on the right biphasic and 0.92 on the left biphasic  Assessment and Plan:  79 year old female with vague right lower extremity symptoms in the setting of heavy tobacco abuse and evidence of at least moderate PAD.  In discussing her symptoms further today it is still unclear to me if this is truly claudication/PAD or some other etiology like neuropathy etc.  Her symptoms occur all the time even when she is not walking and they radiate down and then back up the leg which is very unusual.  Her ABIs are not terrible at 0.75 with a biphasic waveform at the ankle.  I discussed that  I think she should follow-up with her PCP as previously recommended to rule out any other etiologies and if Dr. Volanda Napoleon does not feel there is any other explanation for this the next step would be a lower extremity arteriogram and possible intervention.  I will have her follow-up with me after PCP visit.   Follow Up Instructions:   Follow up after PCP visit.   I discussed the assessment and treatment plan with the patient. The patient was provided an opportunity to ask questions and all were answered. The patient agreed with the plan and demonstrated an understanding of the instructions.   The patient was advised to call back or seek an in-person evaluation if the symptoms worsen or if the condition fails to improve as anticipated.  I spent 10 minutes with the patient via telephone encounter.   Signed, Marty Heck [facility] Office: [phone]  12/09/2019, 12:22 PM

## 2019-12-15 NOTE — Telephone Encounter (Signed)
Note was not needed

## 2019-12-16 ENCOUNTER — Telehealth (INDEPENDENT_AMBULATORY_CARE_PROVIDER_SITE_OTHER): Payer: Medicare Other | Admitting: Family Medicine

## 2019-12-16 DIAGNOSIS — B029 Zoster without complications: Secondary | ICD-10-CM

## 2019-12-16 DIAGNOSIS — I739 Peripheral vascular disease, unspecified: Secondary | ICD-10-CM | POA: Diagnosis not present

## 2019-12-16 DIAGNOSIS — E876 Hypokalemia: Secondary | ICD-10-CM | POA: Diagnosis not present

## 2019-12-16 DIAGNOSIS — M25622 Stiffness of left elbow, not elsewhere classified: Secondary | ICD-10-CM | POA: Diagnosis not present

## 2019-12-16 NOTE — Progress Notes (Signed)
Virtual Visit via Telephone Note  I connected with Tracy Davidson on 12/16/19 at  4:30 PM EST by telephone and verified that I am speaking with the correct person using two identifiers.   I discussed the limitations, risks, security and privacy concerns of performing an evaluation and management service by telephone and the availability of in person appointments. I also discussed with the patient that there may be a patient responsible charge related to this service. The patient expressed understanding and agreed to proceed.  Location patient: home Location provider: work or home office Participants present for the call: patient, provider Patient did not have a visit in the prior 7 days to address this/these issue(s).   History of Present Illness: Pt is a 79 yo female with pmh sig for PAD, HTN, tobacco use, HLD.   Pt seen in ED for shingles on inner L arm/shoulder 11/24/19.  Denies pain in area of rash.  Treated with valtrex.  Now having stiffness and weakness in L arm.  Denies edema, numbness, and tingling in arm.    Pt with h/o PAD.  Endorses pain in R leg occurring all the time. Pt smoking cigarettes. Seen by Vascular Surgery. States cilostazol is not helping the pain.    Pt had the 2nd COVID vaccine on last Friday without issue.    Observations/Objective: Patient sounds cheerful and well on the phone. I do not appreciate any SOB. Speech and thought processing are grossly intact. Patient reported vitals:  Assessment and Plan: Stiffness of left upper arm joint -unclear if 2/2 herpes zoster  -discussed need for in person visit to further evaluate  -appt scheduled for tomorrow.  Pt will reschedule if unable to arrange transportation.  PAD (peripheral artery disease) ([place]) -continue f/u with Vascular  Herpes zoster without complication -improved  Hypokalemia -potassium 3.2 on 11/24/19 in ED -continue klor-con 20 mEq daily -will recheck potassium  Follow Up Instructions: F/u in  person on 12/17/19  I did not refer this patient for an OV in the next 24 hours for this/these issue(s).  I discussed the assessment and treatment plan with the patient. The patient was provided an opportunity to ask questions and all were answered. The patient agreed with the plan and demonstrated an understanding of the instructions.   The patient was advised to call back or seek an in-person evaluation if the symptoms worsen or if the condition fails to improve as anticipated.  I provided 15 minutes of non-face-to-face time during this encounter.   Tracy Ruddy, MD

## 2019-12-17 ENCOUNTER — Ambulatory Visit (INDEPENDENT_AMBULATORY_CARE_PROVIDER_SITE_OTHER): Payer: Medicare Other | Admitting: Family Medicine

## 2019-12-17 ENCOUNTER — Other Ambulatory Visit: Payer: Self-pay

## 2019-12-17 ENCOUNTER — Ambulatory Visit (INDEPENDENT_AMBULATORY_CARE_PROVIDER_SITE_OTHER): Payer: Medicare Other

## 2019-12-17 ENCOUNTER — Encounter: Payer: Self-pay | Admitting: Family Medicine

## 2019-12-17 VITALS — BP 148/90 | HR 110 | Temp 97.8°F | Wt 112.0 lb

## 2019-12-17 DIAGNOSIS — B028 Zoster with other complications: Secondary | ICD-10-CM

## 2019-12-17 DIAGNOSIS — M25512 Pain in left shoulder: Secondary | ICD-10-CM | POA: Diagnosis not present

## 2019-12-17 DIAGNOSIS — M25612 Stiffness of left shoulder, not elsewhere classified: Secondary | ICD-10-CM

## 2019-12-17 DIAGNOSIS — M19012 Primary osteoarthritis, left shoulder: Secondary | ICD-10-CM | POA: Diagnosis not present

## 2019-12-17 NOTE — Progress Notes (Signed)
Subjective:    Patient ID: Tracy Davidson, female    DOB: Feb 17, 1941, 79 y.o.   MRN: [id number]  No chief complaint on file.   HPI Patient was seen today for f/u on L shoulder.  Pt with stiffness and pain in L shoulder s/p shingles outbreak 11/23/18.  Pt unable to lift L arm. Completed course of valtrex.  Pt denies falls or other injury.  Past Medical History:  Diagnosis Date  . ALLERGIC RHINITIS 09/20/2009  . Allergy   . Arthritis   . Cataract    both eyes, right worse thsn left  . COLONIC POLYPS, HX OF 06/06/2007  . DYSLIPIDEMIA 09/20/2009  . HYPERTENSION 06/06/2007  . Knee pain    stiff and pain on side  . LIVER FUNCTION TESTS, ABNORMAL, HX OF    feb or march 2019    Allergies  Allergen Reactions  . Doxycycline     REACTION: tachycardia    ROS General: Denies fever, chills, night sweats, changes in weight, changes in appetite HEENT: Denies headaches, ear pain, changes in vision, rhinorrhea, sore throat CV: Denies CP, palpitations, SOB, orthopnea Pulm: Denies SOB, cough, wheezing GI: Denies abdominal pain, nausea, vomiting, diarrhea, constipation GU: Denies dysuria, hematuria, frequency, vaginal discharge Msk: Denies muscle cramps, joint pains  +L shoulder and arm pain and stiffness Neuro: Denies weakness, numbness, tingling Skin: Denies rashes, bruising  +rash on LUE and back Psych: Denies depression, anxiety, hallucinations      Objective:    Blood pressure (!) 148/90, pulse (!) 110, temperature 97.8 F (36.6 C), temperature source Temporal, weight 112 lb (50.8 kg), SpO2 98 %.   Gen. Pleasant, well-nourished, in no distress, normal affect   HEENT: /AT, face symmetric, no scleral icterus, PERRLA, nares patent without drainage Lungs: no accessory muscle use, CTAB, no wheezes or rales Cardiovascular: RRR, no m/r/g, no peripheral edema Musculoskeletal: No edema or obvious deformity of b/l shoulders.  Pt thin, clavicles prominent. TTP of distal L clavicle and L  lateral scapular border.  No cyanosis or clubbing, normal tone Neuro:  A&Ox3, CN II-XII intact, normal gait Skin:  Warm, dry.   LUE and L upper back/shoulder with healing dried lesions.  L posterior shoulder with hyperpigmented area with excoriation.   Wt Readings from Last 3 Encounters:  12/17/19 112 lb (50.8 kg)  12/09/19 109 lb (49.4 kg)  11/24/19 110 lb (49.9 kg)    Lab Results  Component Value Date   WBC 8.0 11/24/2019   HGB 19.8 (H) 11/24/2019   HCT 59.1 (H) 11/24/2019   PLT 159 11/24/2019   GLUCOSE 86 11/24/2019   CHOL 185 10/24/2017   TRIG 91.0 10/24/2017   HDL 76.70 10/24/2017   LDLDIRECT 143.2 03/14/2011   LDLCALC 90 10/24/2017   ALT 22 10/24/2017   AST 35 10/24/2017   NA 139 11/24/2019   K 3.2 (L) 11/24/2019   CL 96 (L) 11/24/2019   CREATININE 0.69 11/24/2019   BUN 11 11/24/2019   CO2 30 11/24/2019   TSH 1.89 10/24/2017    Assessment/Plan:  Herpes zoster with other complication -resolving  Stiffness of left shoulder joint  -discussed possible causes including arthritis, adhesive capsulitis -will obtain imaging.  Further recommendations based on imaging. -consider PT - Plan: DG Shoulder Left  Acute pain of left shoulder  -Tylenol prn  -consider Tramadol for continued pain - Plan: DG Shoulder Left  F/u prn  [name], MD

## 2019-12-17 NOTE — Patient Instructions (Signed)
Shingles  Shingles, which is also known as herpes zoster, is an infection that causes a painful skin rash and fluid-filled blisters. It is caused by a virus. Shingles only develops in people who:  Have had chickenpox.  Have been given a medicine to protect against chickenpox (have been vaccinated). Shingles is rare in this group. What are the causes? Shingles is caused by varicella-zoster virus (VZV). This is the same virus that causes chickenpox. After a person is exposed to VZV, the virus stays in the body in an inactive (dormant) state. Shingles develops if the virus is reactivated. This can happen many years after the first (initial) exposure to VZV. It is not known what causes this virus to be reactivated. What increases the risk? People who have had chickenpox or received the chickenpox vaccine are at risk for shingles. Shingles infection is more common in people who:  Are older than age 39.  Have a weakened disease-fighting system (immune system), such as people with: ? HIV. ? AIDS. ? Cancer.  Are taking medicines that weaken the immune system, such as transplant medicines.  Are experiencing a lot of stress. What are the signs or symptoms? Early symptoms of this condition include itching, tingling, and pain in an area on your skin. Pain may be described as burning, stabbing, or throbbing. A few days or weeks after early symptoms start, a painful red rash appears. The rash is usually on one side of the body and has a band-like or belt-like pattern. The rash eventually turns into fluid-filled blisters that break open, change into scabs, and dry up in about 2-3 weeks. At any time during the infection, you may also develop:  A fever.  Chills.  A headache.  An upset stomach. How is this diagnosed? This condition is diagnosed with a skin exam. Skin or fluid samples may be taken from the blisters before a diagnosis is made. These samples are examined under a microscope or sent to  a lab for testing. How is this treated? The rash may last for several weeks. There is not a specific cure for this condition. Your health care provider will probably prescribe medicines to help you manage pain, recover more quickly, and avoid long-term problems. Medicines may include:  Antiviral drugs.  Anti-inflammatory drugs.  Pain medicines.  Anti-itching medicines (antihistamines). If the area involved is on your face, you may be referred to a specialist, such as an eye doctor (ophthalmologist) or an ear, nose, and throat (ENT) doctor (otolaryngologist) to help you avoid eye problems, chronic pain, or disability. Follow these instructions at home: Medicines  Take over-the-counter and prescription medicines only as told by your health care provider.  Apply an anti-itch cream or numbing cream to the affected area as told by your health care provider. Relieving itching and discomfort   Apply cold, wet cloths (cold compresses) to the area of the rash or blisters as told by your health care provider.  Cool baths can be soothing. Try adding baking soda or dry oatmeal to the water to reduce itching. Do not bathe in hot water. Blister and rash care  Keep your rash covered with a loose bandage (dressing). Wear loose-fitting clothing to help ease the pain of material rubbing against the rash.  Keep your rash and blisters clean by washing the area with mild soap and cool water as told by your health care provider.  Check your rash every day for signs of infection. Check for: ? More redness, swelling, or pain. ? Fluid  or blood. ? Warmth. ? Pus or a bad smell.  Do not scratch your rash or pick at your blisters. To help avoid scratching: ? Keep your fingernails clean and cut short. ? Wear gloves or mittens while you sleep, if scratching is a problem. General instructions  Rest as told by your health care provider.  Keep all follow-up visits as told by your health care provider. This  is important.  Wash your hands often with soap and water. If soap and water are not available, use hand sanitizer. Doing this lowers your chance of getting a bacterial skin infection.  Before your blisters change into scabs, your shingles infection can cause chickenpox in people who have never had it or have never been vaccinated against it. To prevent this from happening, avoid contact with other people, especially: ? Babies. ? Pregnant women. ? Children who have eczema. ? Elderly people who have transplants. ? People who have chronic illnesses, such as cancer or AIDS. Contact a health care provider if:  Your pain is not relieved with prescribed medicines.  Your pain does not get better after the rash heals.  You have signs of infection in the rash area, such as: ? More redness, swelling, or pain around the rash. ? Fluid or blood coming from the rash. ? The rash area feeling warm to the touch. ? Pus or a bad smell coming from the rash. Get help right away if:  The rash is on your face or nose.  You have facial pain, pain around your eye area, or loss of feeling on one side of your face.  You have difficulty seeing.  You have ear pain or have ringing in your ear.  You have a loss of taste.  Your condition gets worse. Summary  Shingles, which is also known as herpes zoster, is an infection that causes a painful skin rash and fluid-filled blisters.  This condition is diagnosed with a skin exam. Skin or fluid samples may be taken from the blisters and examined before the diagnosis is made.  Keep your rash covered with a loose bandage (dressing). Wear loose-fitting clothing to help ease the pain of material rubbing against the rash.  Before your blisters change into scabs, your shingles infection can cause chickenpox in people who have never had it or have never been vaccinated against it. This information is not intended to replace advice given to you by your health care  provider. Make sure you discuss any questions you have with your health care provider. Document Revised: 01/24/2019 Document Reviewed: 06/06/2017 Elsevier Patient Education  [place].  Shoulder Pain Many things can cause shoulder pain, including:  An injury to the shoulder.  Overuse of the shoulder.  Arthritis. The source of the pain can be:  Inflammation.  An injury to the shoulder joint.  An injury to a tendon, ligament, or bone. Follow these instructions at home: Pay attention to changes in your symptoms. Let your health care provider know about them. Follow these instructions to relieve your pain. If you have a sling:  Wear the sling as told by your health care provider. Remove it only as told by your health care provider.  Loosen the sling if your fingers tingle, become numb, or turn cold and blue.  Keep the sling clean.  If the sling is not waterproof: ? Do not let it get wet. Remove it to shower or bathe.  Move your arm as little as possible, but keep your hand moving  to prevent swelling. Managing pain, stiffness, and swelling   If directed, put ice on the painful area: ? Put ice in a plastic bag. ? Place a towel between your skin and the bag. ? Leave the ice on for 20 minutes, 2-3 times per day. Stop applying ice if it does not help with the pain.  Squeeze a soft ball or a foam pad as much as possible. This helps to keep the shoulder from swelling. It also helps to strengthen the arm. General instructions  Take over-the-counter and prescription medicines only as told by your health care provider.  Keep all follow-up visits as told by your health care provider. This is important. Contact a health care provider if:  Your pain gets worse.  Your pain is not relieved with medicines.  New pain develops in your arm, hand, or fingers. Get help right away if:  Your arm, hand, or fingers: ? Tingle. ? Become numb. ? Become swollen. ? Become  painful. ? Turn white or blue. Summary  Shoulder pain can be caused by an injury, overuse, or arthritis.  Pay attention to changes in your symptoms. Let your health care provider know about them.  This condition may be treated with a sling, ice, and pain medicines.  Contact your health care provider if the pain gets worse or new pain develops. Get help right away if your arm, hand, or fingers tingle or become numb, swollen, or painful.  Keep all follow-up visits as told by your health care provider. This is important. This information is not intended to replace advice given to you by your health care provider. Make sure you discuss any questions you have with your health care provider. Document Revised: 04/16/2018 Document Reviewed: 04/16/2018 Elsevier Patient Education  [place].

## 2020-01-09 ENCOUNTER — Telehealth: Payer: Self-pay | Admitting: Family Medicine

## 2020-01-09 ENCOUNTER — Other Ambulatory Visit: Payer: Self-pay

## 2020-01-09 MED ORDER — POTASSIUM CHLORIDE CRYS ER 20 MEQ PO TBCR: 20.0000 meq | EXTENDED_RELEASE_TABLET | Freq: Every day | ORAL | 2 refills | Status: DC

## 2020-01-09 NOTE — Telephone Encounter (Signed)
Rx sent to pt pharmacy as requested 

## 2020-01-09 NOTE — Telephone Encounter (Signed)
Medication Refill: [facility]acy: [facility] [address] [place]: [phone]

## 2020-01-13 ENCOUNTER — Other Ambulatory Visit: Payer: Self-pay | Admitting: Family Medicine

## 2020-01-13 DIAGNOSIS — I10 Essential (primary) hypertension: Secondary | ICD-10-CM

## 2020-01-28 ENCOUNTER — Other Ambulatory Visit: Payer: Self-pay

## 2020-01-29 ENCOUNTER — Encounter: Payer: Self-pay | Admitting: Family Medicine

## 2020-01-29 ENCOUNTER — Ambulatory Visit (INDEPENDENT_AMBULATORY_CARE_PROVIDER_SITE_OTHER): Payer: Medicare Other | Admitting: Family Medicine

## 2020-01-29 VITALS — BP 138/78 | HR 127 | Temp 97.6°F | Wt 111.4 lb

## 2020-01-29 DIAGNOSIS — I739 Peripheral vascular disease, unspecified: Secondary | ICD-10-CM

## 2020-01-29 DIAGNOSIS — F17210 Nicotine dependence, cigarettes, uncomplicated: Secondary | ICD-10-CM

## 2020-01-29 DIAGNOSIS — Z0000 Encounter for general adult medical examination without abnormal findings: Secondary | ICD-10-CM

## 2020-01-29 DIAGNOSIS — M19012 Primary osteoarthritis, left shoulder: Secondary | ICD-10-CM

## 2020-01-29 DIAGNOSIS — R000 Tachycardia, unspecified: Secondary | ICD-10-CM

## 2020-01-29 LAB — LIPID PANEL
Cholesterol: 199 mg/dL (ref 0–200)
HDL: 98.9 mg/dL (ref >39.00)
LDL Cholesterol: 88 mg/dL (ref 0–99)
NonHDL: 99.89
Total CHOL/HDL Ratio: 2
Triglycerides: 60 mg/dL (ref 0.0–149.0)
VLDL: 12 mg/dL (ref 0.0–40.0)

## 2020-01-29 LAB — CBC WITH DIFFERENTIAL/PLATELET
Basophils Absolute: 0 10*3/uL (ref 0.0–0.1)
Basophils Relative: 0.7 % (ref 0.0–3.0)
Eosinophils Absolute: 0 10*3/uL (ref 0.0–0.7)
Eosinophils Relative: 0.4 % (ref 0.0–5.0)
HCT: 50.2 % — ABNORMAL HIGH (ref 36.0–46.0)
Hemoglobin: 17.3 g/dL — ABNORMAL HIGH (ref 12.0–15.0)
Lymphocytes Relative: 17.4 % (ref 12.0–46.0)
Lymphs Abs: 1.2 10*3/uL (ref 0.7–4.0)
MCHC: 34.5 g/dL (ref 30.0–36.0)
MCV: 103.6 fl — ABNORMAL HIGH (ref 78.0–100.0)
Monocytes Absolute: 0.5 10*3/uL (ref 0.1–1.0)
Monocytes Relative: 7.3 % (ref 3.0–12.0)
Neutro Abs: 5.3 10*3/uL (ref 1.4–7.7)
Neutrophils Relative %: 74.2 % (ref 43.0–77.0)
Platelets: 163 10*3/uL (ref 150.0–400.0)
RBC: 4.85 Mil/uL (ref 3.87–5.11)
RDW: 14.7 % (ref 11.5–15.5)
WBC: 7.1 10*3/uL (ref 4.0–10.5)

## 2020-01-29 LAB — BASIC METABOLIC PANEL
BUN: 9 mg/dL (ref 6–23)
CO2: 32 mEq/L (ref 19–32)
Calcium: 11.5 mg/dL — ABNORMAL HIGH (ref 8.4–10.5)
Chloride: 97 mEq/L (ref 96–112)
Creatinine, Ser: 0.66 mg/dL (ref 0.40–1.20)
GFR: 104.41 mL/min (ref >60.00)
Glucose, Bld: 95 mg/dL (ref 70–99)
Potassium: 3.8 mEq/L (ref 3.5–5.1)
Sodium: 139 mEq/L (ref 135–145)

## 2020-01-29 LAB — HEMOGLOBIN A1C: Hgb A1c MFr Bld: 4.5 % — ABNORMAL LOW (ref 4.6–6.5)

## 2020-01-29 LAB — TSH: TSH: 2.35 u[IU]/mL (ref 0.35–4.50)

## 2020-01-29 LAB — T4, FREE: Free T4: 1 ng/dL (ref 0.60–1.60)

## 2020-01-29 MED ORDER — ASPIRIN EC 81 MG PO TBEC: 81.0000 mg | DELAYED_RELEASE_TABLET | Freq: Every day | ORAL | 11 refills | Status: DC

## 2020-01-29 NOTE — Progress Notes (Signed)
Subjective:   Tracy Davidson is a 79 y.o. female who presents for Medicare Annual (Subsequent) preventive examination.  Pt notes heart racing.  Started this am. Denies chest pain, pressure, n/v, dizziness, HA, numbness or tingling in LUEs.  Smoking cigarettes 1 pack/day.  Not checking BP at home.  Had follow-up with vein and vascular for PAD.  States had ultrasound done but may need to have dye injected into the vessels and legs for better imaging.  Patient notes continued left shoulder stiffness.  Now interested in Physical therapy.         Objective:     Vitals: BP 138/78 (BP Location: Left Arm, Patient Position: Sitting, Cuff Size: Normal)   Pulse (!) 127   Temp 97.6 F (36.4 C) (Temporal)   Wt 111 lb 6.4 oz (50.5 kg)   SpO2 96%   BMI 19.73 kg/m   Body mass index is 19.73 kg/m.  Advanced Directives 10/06/2019 09/18/2018 08/16/2018 12/27/2017  Does Patient Have a Medical Advance Directive? No No No No  Would patient like information on creating a medical advance directive? No - Patient declined Yes (MAU/Ambulatory/Procedural Areas - Information given) Yes (MAU/Ambulatory/Procedural Areas - Information given) -    Tobacco Social History   Tobacco Use  Smoking Status Current Every Day Smoker  . Packs/day: 1.00  . Years: 50.00  . Pack years: 50.00  . Types: Cigarettes  Smokeless Tobacco Never Used     Ready to quit: Not Answered Counseling given: Not Answered   Past Medical History:  Diagnosis Date  . ALLERGIC RHINITIS 09/20/2009  . Allergy   . Arthritis   . Cataract    both eyes, right worse thsn left  . COLONIC POLYPS, HX OF 06/06/2007  . DYSLIPIDEMIA 09/20/2009  . HYPERTENSION 06/06/2007  . Knee pain    stiff and pain on side  . LIVER FUNCTION TESTS, ABNORMAL, HX OF    feb or march 2019   Past Surgical History:  Procedure Laterality Date  . BREAST EXCISIONAL BIOPSY Left 1975  . BREAST SURGERY     breast bx  . COLONOSCOPY    . DILATION AND CURETTAGE OF  UTERUS    . TONSILLECTOMY    . TOTAL ABDOMINAL HYSTERECTOMY     complete  . 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 TRANSURETHRAL RESECTION OF BLADDER TUMOR WITH MITOMYCIN-C N/A 08/16/2018   Procedure: TRANSURETHRAL RESECTION OF BLADDER TUMOR;  Surgeon: Lucas Mallow, MD;  Location: [facility];  Service: Urology;  Laterality: N/A;   Family History  Problem Relation Age of Onset  . Colon cancer Neg Hx   . Colon polyps Neg Hx   . Esophageal cancer Neg Hx   . Rectal cancer Neg Hx   . Stomach cancer Neg Hx   . Pancreatic cancer Neg Hx   . Breast cancer Neg Hx    Social History   Socioeconomic History  . Marital status: Single    Spouse name: Not on file  . Number of children: 0  . Years of education: 6 years college  . Highest education level: Master's degree (e.g., MA, MS, MEng, MEd, MSW, MBA)  Occupational History  . Occupation: retired  Tobacco Use  . Smoking status: Current Every Day Smoker    Packs/day: 1.00    Years:  50.00    Pack years: 50.00    Types: Cigarettes  . Smokeless tobacco: Never Used  Substance and Sexual Activity  . Alcohol use: Yes    Alcohol/week: 2.0 standard drinks    Types: 2 Shots of liquor per week    Comment: daily 2 -3 per day  . Drug use: No  . Sexual activity: Not on file  Other Topics Concern  . Not on file  Social History Narrative  . Not on file   Social Determinants of Health   Financial Resource Strain: Low Risk   . Difficulty of Paying Living Expenses: Not hard at all  Food Insecurity: No Food Insecurity  . Worried About Charity fundraiser in the Last Year: Never true  . Ran Out of Food in the Last Year: Never true  Transportation Needs:   . Lack of Transportation (Medical):   Marland Kitchen Lack of Transportation  (Non-Medical):   Physical Activity:   . Days of Exercise per Week:   . Minutes of Exercise per Session:   Stress:   . Feeling of Stress :   Social Connections:   . Frequency of Communication with Friends and Family:   . Frequency of Social Gatherings with Friends and Family:   . Attends Religious Services:   . Active Member of Clubs or Organizations:   . Attends Archivist Meetings:   Marland Kitchen Marital Status:     Outpatient Encounter Medications as of 01/29/2020  Medication Sig  . calcium carbonate (CALCIUM 600) 600 MG TABS tablet Take 600 mg by mouth 2 (two) times daily with a meal.  . Cholecalciferol (VITAMIN D) 50 MCG (2000 UT) CAPS Take 1 capsule by mouth daily.  . cilostazol (PLETAL) 100 MG tablet Take 1 tablet (100 mg total) by mouth 2 (two) times daily before a meal.  . lisinopril-hydrochlorothiazide (ZESTORETIC) 20-25 MG tablet TAKE 1 TABLET BY MOUTH EVERY DAY  . omeprazole (PRILOSEC) 20 MG capsule TAKE 1 CAPSULE BY MOUTH EVERY DAY  . potassium chloride SA (KLOR-CON M20) 20 MEQ tablet Take 1 tablet (20 mEq total) by mouth daily.   No facility-administered encounter medications on file as of 01/29/2020.    Activities of Daily Living In your present state of health, do you have any difficulty performing the following activities: 10/06/2019  Hearing? N  Vision? N  Difficulty concentrating or making decisions? N  Walking or climbing stairs? N  Dressing or bathing? N  Preparing Food and eating ? N  Using the Toilet? N  In the past six months, have you accidently leaked urine? N  Do you have problems with loss of bowel control? N  Managing your Medications? N  Managing your Finances? N  Housekeeping or managing your Housekeeping? N  Some recent data might be hidden    Patient Care Team: Billie Ruddy, MD as PCP - General (Family Medicine)    Assessment:   This is a routine wellness examination for Tracy Davidson.  Exercise Activities and Dietary recommendations     Goals    . Have 3 meals a day     Try to eat 3 meals per day; even if they are small.        Fall Risk Fall Risk  10/06/2019 12/08/2015 10/30/2013  Falls in the past year? - No Yes  Number falls in past yr: 1 - 2 or more  Injury with Fall? 0 - -  Risk for fall due to : History of fall(s);Medication side effect -  Impaired balance/gait  Follow up Falls evaluation completed;Education provided;Falls prevention discussed - -   Is the patient's home free of loose throw rugs in walkways, pet beds, electrical cords, etc?   yes      Grab bars in the bathroom? no      Handrails on the stairs?   no      Adequate lighting?   yes  Depression Screen PHQ 2/9 Scores 01/29/2020 10/06/2019 12/08/2015 10/30/2013  PHQ - 2 Score 0 0 0 0     Cognitive Function  A&O x3.  Able to state the current year, month, day, date, location, state, president.       Immunization History  Administered Date(s) Administered  . Influenza Split 08/24/2013  . Influenza Whole 08/16/1997  . Influenza, High Dose Seasonal PF 10/24/2017, 08/21/2018, 07/29/2019, 07/29/2019  . Influenza-Unspecified 09/06/2014, 08/07/2015, 08/21/2018  . Pneumococcal Conjugate-13 10/30/2013  . Pneumococcal Polysaccharide-23 10/16/2008  . Tdap 03/14/2011    Qualifies for Shingles Vaccine?yes.  S/p shingles in February and March 2021.  Screening Tests Health Maintenance  Topic Date Due  . INFLUENZA VACCINE  05/16/2020  . MAMMOGRAM  11/12/2020  . TETANUS/TDAP  03/13/2021  . DEXA SCAN  Completed  . PNA vac Low Risk Adult  Completed    Cancer Screenings: Lung: Low Dose CT Chest recommended if Age 85-80 years, 30 pack-year currently smoking OR have quit w/in 15years. Patient does qualify. Breast:  Up to date on Mammogram?  Not indicated 2/2 age Up to date of Bone Density/Dexa? Yes Colorectal: Not indicated 2/2 age  BP 138/78 (BP Location: Left Arm, Patient Position: Sitting, Cuff Size: Normal)   Pulse (!) 127   Temp 97.6 F (36.4  C) (Temporal)   Wt 111 lb 6.4 oz (50.5 kg)   SpO2 96%   BMI 19.73 kg/m   Gen. Pleasant, well developed, well-nourished, in NAD HEENT -wearing glasses [place]/AT, PERRL, EOMI, no scleral icterus, no nasal drainage Lungs: no use of accessory muscles, CTAB, no wheezes, rales or rhonchi Cardiovascular: Tachycardia, No r/g/m, no peripheral edema Abdomen: BS present, soft, nontender,nondistended Musculoskeletal: Decreased ROM of LUE.  no deformities, moves all four extremities, no cyanosis or clubbing, normal tone Neuro:  A&Ox3, CN II-XII intact, normal gait Skin:  Warm, dry, intact, no lesions     Plan:    Medicare annual wellness visit, subsequent -Preventative care discussed including diet, exercise, advanced directives.  Patient strongly encouraged to make will/HC POA/POA.  Given information  Tachycardia  -asymptomatic -EKG changes visit with sinus tachycardia, right atrial enlargement, and new ST elevation in V2.  Cannot rule out ischemia.  Compared to previous study from November 24, 2019 -Pt advised to proceeed to ED, however she declines.  Referral to Cards.  Appt made for tomorrow 4/15 -Start ASA 81 mg -Given precautions for development of symptoms - Plan: EKG 12-Lead, CBC with Differential/Platelet, TSH, T4, Free, Hemoglobin W5I, Basic metabolic panel, Ambulatory referral to Cardiology  Arthritis of left acromioclavicular joint  - Plan: Ambulatory referral to Orthopedic Surgery, Ambulatory referral to Physical Therapy  Cigarette nicotine dependence without complication  -Smoking cessation counseling greater than 3 minutes, less than 10 minutes -Currently smoking 1 pack/day -Discussed various aids to help patient quit but declines at this time -Given handout plan-continue monitor - Plan: CBC with Differential/Platelet, Lipid panel  PAD (peripheral artery disease) (HCC) -Continue cilostazol -Continue follow-up with vein and vascular  I have personally reviewed and noted the  following in the patient's chart:   . Medical and  social history . Use of alcohol, tobacco or illicit drugs  . Current medications and supplements . Functional ability and status . Nutritional status . Physical activity . Advanced directives . List of other physicians . Hospitalizations, surgeries, and ER visits in previous 12 months . Vitals . Screenings to include cognitive, depression, and falls . Referrals and appointments  In addition, I have reviewed and discussed with patient certain preventive protocols, quality metrics, and best practice recommendations. A written personalized care plan for preventive services as well as general preventive health recommendations were provided to patient.     Billie Ruddy, MD  01/29/2020

## 2020-01-29 NOTE — Patient Instructions (Signed)
Preventive Care 80 Years and Older, Female Preventive care refers to lifestyle choices and visits with your health care provider that can promote health and wellness. This includes:  A yearly physical exam. This is also called an annual well check.  Regular dental and eye exams.  Immunizations.  Screening for certain conditions.  Healthy lifestyle choices, such as diet and exercise. What can I expect for my preventive care visit? Physical exam Your health care provider will check:  Height and weight. These may be used to calculate body mass index (BMI), which is a measurement that tells if you are at a healthy weight.  Heart rate and blood pressure.  Your skin for abnormal spots. Counseling Your health care provider may ask you questions about:  Alcohol, tobacco, and drug use.  Emotional well-being.  Home and relationship well-being.  Sexual activity.  Eating habits.  History of falls.  Memory and ability to understand (cognition).  Work and work Statistician.  Pregnancy and menstrual history. What immunizations do I need?  Influenza (flu) vaccine  This is recommended every year. Tetanus, diphtheria, and pertussis (Tdap) vaccine  You may need a Td booster every 10 years. Varicella (chickenpox) vaccine  You may need this vaccine if you have not already been vaccinated. Zoster (shingles) vaccine  You may need this after age 33. Pneumococcal conjugate (PCV13) vaccine  One dose is recommended after age 33. Pneumococcal polysaccharide (PPSV23) vaccine  One dose is recommended after age 72. Measles, mumps, and rubella (MMR) vaccine  You may need at least one dose of MMR if you were born in 1957 or later. You may also need a second dose. Meningococcal conjugate (MenACWY) vaccine  You may need this if you have certain conditions. Hepatitis A vaccine  You may need this if you have certain conditions or if you travel or work in places where you may be exposed  to hepatitis A. Hepatitis B vaccine  You may need this if you have certain conditions or if you travel or work in places where you may be exposed to hepatitis B. Haemophilus influenzae type b (Hib) vaccine  You may need this if you have certain conditions. You may receive vaccines as individual doses or as more than one vaccine together in one shot (combination vaccines). Talk with your health care provider about the risks and benefits of combination vaccines. What tests do I need? Blood tests  Lipid and cholesterol levels. These may be checked every 5 years, or more frequently depending on your overall health.  Hepatitis C test.  Hepatitis B test. Screening  Lung cancer screening. You may have this screening every year starting at age 39 if you have a 30-pack-year history of smoking and currently smoke or have quit within the past 15 years.  Colorectal cancer screening. All adults should have this screening starting at age 36 and continuing until age 15. Your health care provider may recommend screening at age 23 if you are at increased risk. You will have tests every 1-10 years, depending on your results and the type of screening test.  Diabetes screening. This is done by checking your blood sugar (glucose) after you have not eaten for a while (fasting). You may have this done every 1-3 years.  Mammogram. This may be done every 1-2 years. Talk with your health care provider about how often you should have regular mammograms.  BRCA-related cancer screening. This may be done if you have a family history of breast, ovarian, tubal, or peritoneal cancers.  Other tests  Sexually transmitted disease (STD) testing.  Bone density scan. This is done to screen for osteoporosis. You may have this done starting at age 36. Follow these instructions at home: Eating and drinking  Eat a diet that includes fresh fruits and vegetables, whole grains, lean protein, and low-fat dairy products. Limit  your intake of foods with high amounts of sugar, saturated fats, and salt.  Take vitamin and mineral supplements as recommended by your health care provider.  Do not drink alcohol if your health care provider tells you not to drink.  If you drink alcohol: ? Limit how much you have to 0-1 drink a day. ? Be aware of how much alcohol is in your drink. In the [place] one drink equals one 12 oz bottle of beer (355 mL), one 5 oz glass of wine (148 mL), or one 1 oz glass of hard liquor (44 mL). Lifestyle  Take daily care of your teeth and gums.  Stay active. Exercise for at least 30 minutes on 5 or more days each week.  Do not use any products that contain nicotine or tobacco, such as cigarettes, e-cigarettes, and chewing tobacco. If you need help quitting, ask your health care provider.  If you are sexually active, practice safe sex. Use a condom or other form of protection in order to prevent STIs (sexually transmitted infections).  Talk with your health care provider about taking a low-dose aspirin or statin. What's next?  Go to your health care provider once a year for a well check visit.  Ask your health care provider how often you should have your eyes and teeth checked.  Stay up to date on all vaccines. This information is not intended to replace advice given to you by your health care provider. Make sure you discuss any questions you have with your health care provider. Document Revised: 09/26/2018 Document Reviewed: 09/26/2018 Elsevier Patient Education  [place].  Sinus Tachycardia  Sinus tachycardia is a kind of fast heartbeat. In sinus tachycardia, the heart beats more than 100 times a minute. Sinus tachycardia starts in a part of the heart called the sinus node. Sinus tachycardia may be harmless, or it may be a sign of a serious condition. What are the causes? This condition may be caused by:  Exercise or exertion.  A fever.  Pain.  Loss of body fluids  (dehydration).  Severe bleeding (hemorrhage).  Anxiety and stress.  Certain substances, including: ? Alcohol. ? Caffeine. ? Tobacco and nicotine products. ? Cold medicines. ? Illegal drugs.  Medical conditions including: ? Heart disease. ? An infection. ? An overactive thyroid (hyperthyroidism). ? A lack of red blood cells (anemia). What are the signs or symptoms? Symptoms of this condition include:  A feeling that the heart is beating quickly (palpitations).  Suddenly noticing your heartbeat (cardiac awareness).  Dizziness.  Tiredness (fatigue).  Shortness of breath.  Chest pain.  Nausea.  Fainting. How is this diagnosed? This condition is diagnosed with:  A physical exam.  Other tests, such as: ? Blood tests. ? An electrocardiogram (ECG). This test measures the electrical activity of the heart. ? Ambulatory cardiac monitor. This records your heartbeats for 24 hours or more. You may be referred to a heart specialist (cardiologist). How is this treated? Treatment for this condition depends on the cause or the underlying condition. Treatment may involve:  Treating the underlying condition.  Taking new medicines or changing your current medicines as told by your health care provider.  Making changes to your diet or lifestyle. Follow these instructions at home: Lifestyle   Do not use any products that contain nicotine or tobacco, such as cigarettes and e-cigarettes. If you need help quitting, ask your health care provider.  Do not use illegal drugs, such as cocaine.  Learn relaxation methods to help you when you get stressed or anxious. These include deep breathing.  Avoid caffeine or other stimulants. Alcohol use   Do not drink alcohol if: ? Your health care provider tells you not to drink. ? You are pregnant, may be pregnant, or are planning to become pregnant.  If you drink alcohol, limit how much you have: ? 0-1 drink a day for women. ? 0-2  drinks a day for men.  Be aware of how much alcohol is in your drink. In the [place] one drink equals one typical bottle of beer (12 oz), one-half glass of wine (5 oz), or one shot of hard liquor (1 oz). General instructions  Drink enough fluids to keep your urine pale yellow.  Take over-the-counter and prescription medicines only as told by your health care provider.  Keep all follow-up visits as told by your health care provider. This is important. Contact a health care provider if you have:  A fever.  Vomiting or diarrhea that does not go away. Get help right away if you:  Have pain in your chest, upper arms, jaw, or neck.  Become weak or dizzy.  Feel faint.  Have palpitations that do not go away. Summary  In sinus tachycardia, the heart beats more than 100 times a minute.  Sinus tachycardia may be harmless, or it may be a sign of a serious condition.  Treatment for this condition depends on the cause or the underlying condition.  Get help right away if you have pain in your chest, upper arms, jaw, or neck. This information is not intended to replace advice given to you by your health care provider. Make sure you discuss any questions you have with your health care provider. Document Revised: 11/21/2017 Document Reviewed: 11/21/2017 Elsevier Patient Education  [place].  Steps to Quit Smoking Smoking tobacco is the leading cause of preventable death. It can affect almost every organ in the body. Smoking puts you and people around you at risk for many serious, long-lasting (chronic) diseases. Quitting smoking can be hard, but it is one of the best things that you can do for your health. It is never too late to quit. How do I get ready to quit? When you decide to quit smoking, make a plan to help you succeed. Before you quit:  Pick a date to quit. Set a date within the next 2 weeks to give you time to prepare.  Write down the reasons why you are quitting. Keep  this list in places where you will see it often.  Tell your family, friends, and co-workers that you are quitting. Their support is important.  Talk with your doctor about the choices that may help you quit.  Find out if your health insurance will pay for these treatments.  Know the people, places, things, and activities that make you want to smoke (triggers). Avoid them. What first steps can I take to quit smoking?  Throw away all cigarettes at home, at work, and in your car.  Throw away the things that you use when you smoke, such as ashtrays and lighters.  Clean your car. Make sure to empty the ashtray.  Clean your  home, including curtains and carpets. What can I do to help me quit smoking? Talk with your doctor about taking medicines and seeing a counselor at the same time. You are more likely to succeed when you do both.  If you are pregnant or breastfeeding, talk with your doctor about counseling or other ways to quit smoking. Do not take medicine to help you quit smoking unless your doctor tells you to do so. To quit smoking: Quit right away  Quit smoking totally, instead of slowly cutting back on how much you smoke over a period of time.  Go to counseling. You are more likely to quit if you go to counseling sessions regularly. Take medicine You may take medicines to help you quit. Some medicines need a prescription, and some you can buy over-the-counter. Some medicines may contain a drug called nicotine to replace the nicotine in cigarettes. Medicines may:  Help you to stop having the desire to smoke (cravings).  Help to stop the problems that come when you stop smoking (withdrawal symptoms). Your doctor may ask you to use:  Nicotine patches, gum, or lozenges.  Nicotine inhalers or sprays.  Non-nicotine medicine that is taken by mouth. Find resources Find resources and other ways to help you quit smoking and remain smoke-free after you quit. These resources are most  helpful when you use them often. They include:  Online chats with a Social worker.  Phone quitlines.  Printed Furniture conservator/restorer.  Support groups or group counseling.  Text messaging programs.  Mobile phone apps. Use apps on your mobile phone or tablet that can help you stick to your quit plan. There are many free apps for mobile phones and tablets as well as websites. Examples include Quit Guide from the [employer] and smokefree.gov  What things can I do to make it easier to quit?   Talk to your family and friends. Ask them to support and encourage you.  Call a phone quitline (1-800-QUIT-NOW), reach out to support groups, or work with a Social worker.  Ask people who smoke to not smoke around you.  Avoid places that make you want to smoke, such as: ? Bars. ? Parties. ? Smoke-break areas at work.  Spend time with people who do not smoke.  Lower the stress in your life. Stress can make you want to smoke. Try these things to help your stress: ? Getting regular exercise. ? Doing deep-breathing exercises. ? Doing yoga. ? Meditating. ? Doing a body scan. To do this, close your eyes, focus on one area of your body at a time from head to toe. Notice which parts of your body are tense. Try to relax the muscles in those areas. How will I feel when I quit smoking? Day 1 to 3 weeks Within the first 24 hours, you may start to have some problems that come from quitting tobacco. These problems are very bad 2-3 days after you quit, but they do not often last for more than 2-3 weeks. You may get these symptoms:  Mood swings.  Feeling restless, nervous, angry, or annoyed.  Trouble concentrating.  Dizziness.  Strong desire for high-sugar foods and nicotine.  Weight gain.  Trouble pooping (constipation).  Feeling like you may vomit (nausea).  Coughing or a sore throat.  Changes in how the medicines that you take for other issues work in your body.  Depression.  Trouble sleeping  (insomnia). Week 3 and afterward After the first 2-3 weeks of quitting, you may start to notice more positive  results, such as:  Better sense of smell and taste.  Less coughing and sore throat.  Slower heart rate.  Lower blood pressure.  Clearer skin.  Better breathing.  Fewer sick days. Quitting smoking can be hard. Do not give up if you fail the first time. Some people need to try a few times before they succeed. Do your best to stick to your quit plan, and talk with your doctor if you have any questions or concerns. Summary  Smoking tobacco is the leading cause of preventable death. Quitting smoking can be hard, but it is one of the best things that you can do for your health.  When you decide to quit smoking, make a plan to help you succeed.  Quit smoking right away, not slowly over a period of time.  When you start quitting, seek help from your doctor, family, or friends. This information is not intended to replace advice given to you by your health care provider. Make sure you discuss any questions you have with your health care provider. Document Revised: 06/27/2019 Document Reviewed: 12/21/2018 Elsevier Patient Education  [place].  Aspirin and Your Heart  Aspirin is a medicine that prevents the cells in the blood that are used for clotting, called platelets, from sticking together. Aspirin can be used to help reduce the risk of blood clots, heart attacks, and other heart-related problems. Can I take aspirin? Your health care provider will help you determine whether it is safe and beneficial for you to take aspirin daily. Taking aspirin daily may be helpful if you:  Have had a heart attack or chest pain.  Are at risk for a heart attack.  Have undergone open-heart surgery, such as coronary artery bypass surgery (CABG).  Have had coronary angioplasty or a stent.  Have had certain types of stroke or transient ischemic attack (TIA).  Have peripheral artery  disease (PAD).  Have chronic heart rhythm problems such as atrial fibrillation and cannot take an anticoagulant.  Have valve disease or have had surgery on a valve. What are the risks? Daily use of aspirin can cause side effects. Some of these include:  Bleeding. Bleeding problems can be minor or serious. An example of a minor problem is a cut that does not stop bleeding. An example of a more serious problem is stomach bleeding or, rarely, bleeding into the brain. Your risk of bleeding is increased if you are also taking non-steroidal anti-inflammatory drugs (NSAIDs).  Increased bruising.  Upset stomach.  An allergic reaction. People who have nasal polyps have an increased risk of developing an aspirin allergy. General guidelines  Take aspirin only as told by your health care provider. Make sure that you understand how much you should take and what form you should take. The two forms of aspirin are: ? Non-enteric-coated.This type of aspirin does not have a coating and is absorbed quickly. This type of aspirin also comes in a chewable form. ? Enteric-coated. This type of aspirin has a coating that releases the medicine very slowly. Enteric-coated aspirin might cause less stomach upset than non-enteric-coated aspirin. This type of aspirin should not be chewed or crushed.  Limit alcohol intake to no more than 1 drink a day for nonpregnant women and 2 drinks a day for men. Drinking alcohol increases your risk of bleeding. One drink equals 12 oz of beer, 5 oz of wine, or 1 oz of hard liquor. Contact a health care provider if you:  Have unusual bleeding or bruising.  Have stomach pain or nausea.  Have ringing in your ears.  Have an allergic reaction that causes: ? Hives. ? Itchy skin. ? Swelling of the lips, tongue, or face. Get help right away if you:  Notice that your bowel movements are bloody, dark red, or black in color.  Vomit or cough up blood.  Have blood in your  urine.  Cough, have noisy breathing (wheeze), or feel short of breath.  Have chest pain, especially if the pain spreads to the arms, back, neck, or jaw.  Have a severe headache, or a headache with confusion, or dizziness. These symptoms may represent a serious problem that is an emergency. Do not wait to see if the symptoms will go away. Get medical help right away. Call your local emergency services (911 in the [place] Do not drive yourself to the hospital. Summary  Aspirin can be used to help reduce the risk of blood clots, heart attacks, and other heart-related problems.  Daily use of aspirin can increase your risk of side effects. Your health care provider will help you determine whether it is safe and beneficial for you to take aspirin daily.  Take aspirin only as told by your health care provider. Make sure that you understand how much you can take and what form you can take. This information is not intended to replace advice given to you by your health care provider. Make sure you discuss any questions you have with your health care provider. Document Revised: 08/02/2017 Document Reviewed: 08/02/2017 Elsevier Patient Education  2020 [employer].

## 2020-01-30 ENCOUNTER — Other Ambulatory Visit: Payer: Self-pay

## 2020-01-30 ENCOUNTER — Encounter: Payer: Self-pay | Admitting: Family Medicine

## 2020-01-30 ENCOUNTER — Telehealth: Payer: Self-pay | Admitting: Family Medicine

## 2020-01-30 ENCOUNTER — Encounter: Payer: Self-pay | Admitting: Cardiology

## 2020-01-30 ENCOUNTER — Ambulatory Visit: Payer: Medicare Other | Admitting: Cardiology

## 2020-01-30 VITALS — BP 146/80 | HR 120 | Ht 63.0 in | Wt 109.0 lb

## 2020-01-30 DIAGNOSIS — I739 Peripheral vascular disease, unspecified: Secondary | ICD-10-CM

## 2020-01-30 DIAGNOSIS — I10 Essential (primary) hypertension: Secondary | ICD-10-CM | POA: Diagnosis not present

## 2020-01-30 DIAGNOSIS — R0602 Shortness of breath: Secondary | ICD-10-CM | POA: Diagnosis not present

## 2020-01-30 DIAGNOSIS — R000 Tachycardia, unspecified: Secondary | ICD-10-CM | POA: Insufficient documentation

## 2020-01-30 DIAGNOSIS — D751 Secondary polycythemia: Secondary | ICD-10-CM | POA: Insufficient documentation

## 2020-01-30 NOTE — Progress Notes (Signed)
Cardiology Office Note:    Date:  01/30/2020   ID:  Tracy Davidson, DOB 14-Feb-1941, MRN [id number]  PCP:  Billie Ruddy, MD  Cardiologist:  Shirlee More, MD   Referring MD: Billie Ruddy, MD  ASSESSMENT:    1. Sinus tachycardia   2. Essential hypertension   3. SOB (shortness of breath) on exertion   4. Hypercalcemia   5. PAD (peripheral artery disease) (HCC)    PLAN:    In order of problems listed above:  1. Her presenting problem is sinus tachycardia without anemia or thyroid abnormality.  She is taking no rate stimulating medications.  We discussed that this is an alert response rather than a cause and looking at her EKG her cigarette smoking history and her shortness of breath I suspect she has significant COPD and that is the precipitant.  We will check an echocardiogram to exclude cardiomyopathy I told her we can mitigate the tachycardia of is bothersome by altering her blood pressure medications either using a selective beta-blocker like Bystolic or rate limiting calcium channel blocker like Cardizem but she said the symptoms are not really bothersome and she prefers not to change her medications.  See her back in the office after the echocardiogram is performed I strongly encouraged her to stop smoking 2. Stable BP at target continue current treatment 3. Check echocardiogram but I suspect she has significant COPD she is aware this she seems committed given information encouraged to stop smoking 4. On her recent labs she may require evaluation for hyper parathyroidism 5. She has responded to treatment with Pletal however it is contraindicated with heart failure and will check echocardiogram in the office  Next appointment in 4 weeks   Medication Adjustments/Labs and Tests Ordered: Current medicines are reviewed at length with the patient today.  Concerns regarding medicines are outlined above.  Orders Placed This Encounter  Procedures  . ECHOCARDIOGRAM COMPLETE   No  orders of the defined types were placed in this encounter.    Chief Complaint  Patient presents with  . New Patient (Initial Visit)    History of Present Illness:    Tracy Davidson is a 79 y.o. female who is being seen today for the evaluation of rapid heart rate at the request of Billie Ruddy, MD. She has a history of hypertension and dyslipidemia labs yesterday showed elevated calcium 11.5 elevated hemoglobin 17.5 lipid profile showed total cholesterol 199 LDL of 88 HDL 99 and free TSH and T4 were normal.  I reviewed the EKG personally from yesterday sinus tachycardia minor nonspecific ST changes right atrial enlargement there is no pattern of infarction.  She has PAD with abnormal ABIs moderate on the right mild on the left and takes Pletal.  Infrequently she is aware of her heartbeat and rapid she noticed it yesterday.  She has not done as well in the last months and she has developed shortness of breath when she does activities like going to the store which is a new finding.  She has now smoked for close to 55 years.  Is not had chest pain syncope takes no over-the-counter proarrhythmic drugs has no history of congenital or rheumatic heart disease.  She is not using bronchodilators.  Presently no cough or wheezing and having no orthopnea.  I reviewed her studies above with the patient.  At this time she is not having claudication. Past Medical History:  Diagnosis Date  . ALLERGIC RHINITIS 09/20/2009  . Allergy   .  Arthritis   . Cataract    both eyes, right worse thsn left  . COLONIC POLYPS, HX OF 06/06/2007  . DYSLIPIDEMIA 09/20/2009  . HYPERTENSION 06/06/2007  . Knee pain    stiff and pain on side  . LIVER FUNCTION TESTS, ABNORMAL, HX OF    feb or march 2019    Past Surgical History:  Procedure Laterality Date  . BREAST EXCISIONAL BIOPSY Left 1975  . BREAST SURGERY     breast bx  . COLONOSCOPY    . DILATION AND CURETTAGE OF UTERUS    . TONSILLECTOMY    . TOTAL ABDOMINAL  HYSTERECTOMY     complete  . 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 TRANSURETHRAL RESECTION OF BLADDER TUMOR WITH MITOMYCIN-C N/A 08/16/2018   Procedure: TRANSURETHRAL RESECTION OF BLADDER TUMOR;  Surgeon: Lucas Mallow, MD;  Location: [facility];  Service: Urology;  Laterality: N/A;    Current Medications: Current Meds  Medication Sig  . calcium carbonate (CALCIUM 600) 600 MG TABS tablet Take 600 mg by mouth 2 (two) times daily with a meal.  . Cholecalciferol (VITAMIN D) 50 MCG (2000 UT) CAPS Take 1 capsule by mouth daily.  . cilostazol (PLETAL) 100 MG tablet Take 1 tablet (100 mg total) by mouth 2 (two) times daily before a meal.  . lisinopril-hydrochlorothiazide (ZESTORETIC) 20-25 MG tablet TAKE 1 TABLET BY MOUTH EVERY DAY  . omeprazole (PRILOSEC) 20 MG capsule TAKE 1 CAPSULE BY MOUTH EVERY DAY  . potassium chloride SA (KLOR-CON M20) 20 MEQ tablet Take 1 tablet (20 mEq total) by mouth daily.     Allergies:   Doxycycline   Social History   Socioeconomic History  . Marital status: Single    Spouse name: Not on file  . Number of children: 0  . Years of education: 6 years college  . Highest education level: Master's degree (e.g., MA, MS, MEng, MEd, MSW, MBA)  Occupational History  . Occupation: retired  Tobacco Use  . Smoking status: Current Every Day Smoker    Packs/day: 1.00    Years: 50.00    Pack years: 50.00    Types: Cigarettes  . Smokeless tobacco: Never Used  Substance and Sexual Activity  . Alcohol use: Yes    Alcohol/week: 2.0 standard drinks    Types: 2 Shots of liquor per week    Comment: daily 2 -3 per day  . Drug use: No  . Sexual activity: Not on file  Other Topics Concern  . Not on file  Social History Narrative  . Not on file   Social  Determinants of Health   Financial Resource Strain: Low Risk   . Difficulty of Paying Living Expenses: Not hard at all  Food Insecurity: No Food Insecurity  . Worried About Charity fundraiser in the Last Year: Never true  . Ran Out of Food in the Last Year: Never true  Transportation Needs:   . Lack of Transportation (Medical):   Marland Kitchen Lack of Transportation (Non-Medical):   Physical Activity:   . Days of Exercise per Week:   . Minutes of Exercise per Session:   Stress:   . Feeling of Stress :   Social Connections:   . Frequency of Communication with Friends and Family:   . Frequency of Social Gatherings  with Friends and Family:   . Attends Religious Services:   . Active Member of Clubs or Organizations:   . Attends Archivist Meetings:   Marland Kitchen Marital Status:      Family History: The patient's \ family history is negative for Colon cancer, Colon polyps, Esophageal cancer, Rectal cancer, Stomach cancer, Pancreatic cancer, and Breast cancer.  ROS:   Review of Systems  Constitution: Positive for weight loss.  HENT: Negative.   Eyes: Negative.   Cardiovascular: Positive for dyspnea on exertion and palpitations.  Respiratory: Positive for shortness of breath.   Endocrine: Negative.   Hematologic/Lymphatic: Negative.   Skin: Negative.   Musculoskeletal: Positive for joint pain.  Gastrointestinal: Negative.   Genitourinary: Negative.   Neurological: Negative.   Psychiatric/Behavioral: Negative.   Allergic/Immunologic: Negative.    Please see the history of present illness.     All other systems reviewed and are negative.  EKGs/Labs/Other Studies Reviewed:    The following studies were reviewed today:  Recent Labs: 01/29/2020: BUN 9; Creatinine, Ser 0.66; Hemoglobin 17.3; Platelets 163.0; Potassium 3.8; Sodium 139; TSH 2.35  Recent Lipid Panel    Component Value Date/Time   CHOL 199 01/29/2020 1118   TRIG 60.0 01/29/2020 1118   HDL 98.90 01/29/2020 1118   CHOLHDL  2 01/29/2020 1118   VLDL 12.0 01/29/2020 1118   LDLCALC 88 01/29/2020 1118   LDLDIRECT 143.2 03/14/2011 1111    Physical Exam:    VS:  BP (!) 146/80 (BP Location: Left Arm, Patient Position: Sitting, Cuff Size: Small)   Pulse (!) 120   Ht 5\' 3"  (1.6 m)   Wt 109 lb (49.4 kg)   SpO2 98%   BMI 19.31 kg/m     Wt Readings from Last 3 Encounters:  01/30/20 109 lb (49.4 kg)  01/29/20 111 lb 6.4 oz (50.5 kg)  12/17/19 112 lb (50.8 kg)     GEN: She appears frail well nourished, well developed in no acute distress HEENT: Normal NECK: No JVD; No carotid bruits LYMPHATICS: No lymphadenopathy CARDIAC: Rapid heart rate at rest RRR, no murmurs, rubs, gallops RESPIRATORY:  Clear to auscultation without rales, wheezing or rhonchi  ABDOMEN: Soft, non-tender, non-distended MUSCULOSKELETAL:  No edema; No deformity  SKIN: Warm and dry NEUROLOGIC:  Alert and oriented x 3 PSYCHIATRIC:  Normal affect     Signed, Shirlee More, MD  01/30/2020 10:24 AM    [place]

## 2020-01-30 NOTE — Telephone Encounter (Signed)
Pt was returning Nancy's call and needs a call back at [phone]

## 2020-01-30 NOTE — Patient Instructions (Addendum)
Medication Instructions:  Your physician recommends that you continue on your current medications as directed. Please refer to the Current Medication list given to you today.  *If you need a refill on your cardiac medications before your next appointment, please call your pharmacy*   Lab Work: None If you have labs (blood work) drawn today and your tests are completely normal, you will receive your results only by: Marland Kitchen MyChart Message (if you have MyChart) OR . A paper copy in the mail If you have any lab test that is abnormal or we need to change your treatment, we will call you to review the results.   Testing/Procedures: Your physician has requested that you have an echocardiogram. Echocardiography is a painless test that uses sound waves to create images of your heart. It provides your doctor with information about the size and shape of your heart and how well your heart's chambers and valves are working. This procedure takes approximately one hour. There are no restrictions for this procedure.     Follow-Up: At [facility], you and your health needs are our priority.  As part of our continuing mission to provide you with exceptional heart care, we have created designated Provider Care Teams.  These Care Teams include your primary Cardiologist (physician) and Advanced Practice Providers (APPs -  Physician Assistants and Nurse Practitioners) who all work together to provide you with the care you need, when you need it.  We recommend signing up for the patient portal called "MyChart".  Sign up information is provided on this After Visit Summary.  MyChart is used to connect with patients for Virtual Visits (Telemedicine).  Patients are able to view lab/test results, encounter notes, upcoming appointments, etc.  Non-urgent messages can be sent to your provider as well.   To learn more about what you can do with MyChart, go to [email or web address].    Your next appointment:   3-6  week(s)  The format for your next appointment:   In Person  Provider:   Shirlee More, MD   Other Instructions  Coping with Quitting Smoking  Quitting smoking is a physical and mental challenge. You will face cravings, withdrawal symptoms, and temptation. Before quitting, work with your health care provider to make a plan that can help you cope. Preparation can help you quit and keep you from giving in. How can I cope with cravings? Cravings usually last for 5-10 minutes. If you get through it, the craving will pass. Consider taking the following actions to help you cope with cravings:  Keep your mouth busy: ? Chew sugar-free gum. ? Suck on hard candies or a straw. ? Brush your teeth.  Keep your hands and body busy: ? Immediately change to a different activity when you feel a craving. ? Squeeze or play with a ball. ? Do an activity or a hobby, like making bead jewelry, practicing needlepoint, or working with wood. ? Mix up your normal routine. ? Take a short exercise break. Go for a quick walk or run up and down stairs. ? Spend time in public places where smoking is not allowed.  Focus on doing something kind or helpful for someone else.  Call a friend or family member to talk during a craving.  Join a support group.  Call a quit line, such as 1-800-QUIT-NOW.  Talk with your health care provider about medicines that might help you cope with cravings and make quitting easier for you. How can I deal with withdrawal symptoms? Your body  may experience negative effects as it tries to get used to not having nicotine in the system. These effects are called withdrawal symptoms. They may include:  Feeling hungrier than normal.  Trouble concentrating.  Irritability.  Trouble sleeping.  Feeling depressed.  Restlessness and agitation.  Craving a cigarette. To manage withdrawal symptoms:  Avoid places, people, and activities that trigger your cravings.  Remember why you  want to quit.  Get plenty of sleep.  Avoid coffee and other caffeinated drinks. These may worsen some of your symptoms. How can I handle social situations? Social situations can be difficult when you are quitting smoking, especially in the first few weeks. To manage this, you can:  Avoid parties, bars, and other social situations where people might be smoking.  Avoid alcohol.  Leave right away if you have the urge to smoke.  Explain to your family and friends that you are quitting smoking. Ask for understanding and support.  Plan activities with friends or family where smoking is not an option. What are some ways I can cope with stress? Wanting to smoke may cause stress, and stress can make you want to smoke. Find ways to manage your stress. Relaxation techniques can help. For example:  Breathe slowly and deeply, in through your nose and out through your mouth.  Listen to soothing, relaxing music.  Talk with a family member or friend about your stress.  Light a candle.  Soak in a bath or take a shower.  Think about a peaceful place. What are some ways I can prevent weight gain? Be aware that many people gain weight after they quit smoking. However, not everyone does. To keep from gaining weight, have a plan in place before you quit and stick to the plan after you quit. Your plan should include:  Having healthy snacks. When you have a craving, it may help to: ? Eat plain popcorn, crunchy carrots, celery, or other cut vegetables. ? Chew sugar-free gum.  Changing how you eat: ? Eat small portion sizes at meals. ? Eat 4-6 small meals throughout the day instead of 1-2 large meals a day. ? Be mindful when you eat. Do not watch television or do other things that might distract you as you eat.  Exercising regularly: ? Make time to exercise each day. If you do not have time for a long workout, do short bouts of exercise for 5-10 minutes several times a day. ? Do some form of  strengthening exercise, like weight lifting, and some form of aerobic exercise, like running or swimming.  Drinking plenty of water or other low-calorie or no-calorie drinks. Drink 6-8 glasses of water daily, or as much as instructed by your health care provider. Summary  Quitting smoking is a physical and mental challenge. You will face cravings, withdrawal symptoms, and temptation to smoke again. Preparation can help you as you go through these challenges.  You can cope with cravings by keeping your mouth busy (such as by chewing gum), keeping your body and hands busy, and making calls to family, friends, or a helpline for people who want to quit smoking.  You can cope with withdrawal symptoms by avoiding places where people smoke, avoiding drinks with caffeine, and getting plenty of rest.  Ask your health care provider about the different ways to prevent weight gain, avoid stress, and handle social situations. This information is not intended to replace advice given to you by your health care provider. Make sure you discuss any questions you have  with your health care provider. Document Revised: 09/14/2017 Document Reviewed: 09/29/2016 Elsevier Patient Education  2020 [employer].

## 2020-02-02 ENCOUNTER — Other Ambulatory Visit: Payer: Self-pay

## 2020-02-02 ENCOUNTER — Ambulatory Visit (HOSPITAL_BASED_OUTPATIENT_CLINIC_OR_DEPARTMENT_OTHER)
Admission: RE | Admit: 2020-02-02 | Discharge: 2020-02-02 | Disposition: A | Discharge status: Home / Self Care | Payer: Medicare Other | Source: Ambulatory Visit | Attending: Cardiology | Admitting: Cardiology

## 2020-02-02 DIAGNOSIS — I10 Essential (primary) hypertension: Secondary | ICD-10-CM

## 2020-02-02 NOTE — Progress Notes (Signed)
  Echocardiogram 2D Echocardiogram has been performed.  Tracy Davidson 02/02/2020, 2:02 PM

## 2020-02-03 ENCOUNTER — Telehealth: Payer: Self-pay

## 2020-02-03 ENCOUNTER — Ambulatory Visit: Payer: Medicare Other | Attending: Family Medicine | Admitting: Physical Therapy

## 2020-02-03 ENCOUNTER — Encounter: Payer: Self-pay | Admitting: Physical Therapy

## 2020-02-03 DIAGNOSIS — M25512 Pain in left shoulder: Secondary | ICD-10-CM | POA: Diagnosis not present

## 2020-02-03 DIAGNOSIS — M6281 Muscle weakness (generalized): Secondary | ICD-10-CM | POA: Diagnosis not present

## 2020-02-03 DIAGNOSIS — M25612 Stiffness of left shoulder, not elsewhere classified: Secondary | ICD-10-CM | POA: Insufficient documentation

## 2020-02-03 NOTE — Telephone Encounter (Signed)
Tried calling patient. Phone number does not work.

## 2020-02-03 NOTE — Therapy (Signed)
[facility] [address], [place], [place], [address] Phone: [phone]   Fax:  [phone]  Physical Therapy Evaluation  Patient Details  Name: Tracy Davidson MRN: [id number] Date of Birth: Nov 12, 1940 Referring Provider (PT): Dr. [name]    Encounter Date: 02/03/2020  PT End of Session - 02/03/20 1841    Visit Number  1    Date for PT Re-Evaluation  03/30/20    Authorization Type  [employer] Medicare    PT Start Time  0800    PT Stop Time  0841    PT Time Calculation (min)  41 min    Activity Tolerance  Patient tolerated treatment well       Past Medical History:  Diagnosis Date  . ALLERGIC RHINITIS 09/20/2009  . Allergy   . Arthritis   . Cataract    both eyes, right worse thsn left  . COLONIC POLYPS, HX OF 06/06/2007  . DYSLIPIDEMIA 09/20/2009  . HYPERTENSION 06/06/2007  . Knee pain    stiff and pain on side  . LIVER FUNCTION TESTS, ABNORMAL, HX OF    feb or march 2019    Past Surgical History:  Procedure Laterality Date  . BREAST EXCISIONAL BIOPSY Left 1975  . BREAST SURGERY     breast bx  . COLONOSCOPY    . DILATION AND CURETTAGE OF UTERUS    . TONSILLECTOMY    . TOTAL ABDOMINAL HYSTERECTOMY     complete  . 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 TRANSURETHRAL RESECTION OF BLADDER TUMOR WITH MITOMYCIN-C N/A 08/16/2018   Procedure: TRANSURETHRAL RESECTION OF BLADDER TUMOR;  Surgeon: Lucas Mallow, MD;  Location: [facility];  Service: Urology;  Laterality: N/A;    There were no vitals filed for this visit.   Subjective Assessment - 02/03/20 0806    Subjective  In February, began to have left shoulder difficulty raising it.  Went to ED.  Had EKG and bloodwork and they said it was  shingles.  Dr. Volanda Napoleon said it has arthritis in the [facility] joint.  Still can't raise it up.  Difficulty changing sheets and holding a pot in her left hand.    Pertinent History  had ECHO yesterday after physical with Dr. Volanda Napoleon    Limitations  House hold activities    Diagnostic tests  x-ray [facility] joint arthritis    Patient Stated Goals  use this arm better    Currently in Pain?  No/denies    Pain Score  0-No pain    Pain Location  Shoulder    Pain Orientation  Left    Pain Type  Chronic pain    Aggravating Factors   random no patterns    Pain Relieving Factors  nothing         [facility] PT Assessment - 02/03/20 0001      Assessment   Medical Diagnosis  left AC joint arthritis     Referring Provider (PT)  Dr. [name]     Onset Date/Surgical Date  --   February   Hand Dominance  Right    Next MD Visit  as needed    Prior Therapy  no      Precautions   Precautions  None      Restrictions  Weight Bearing Restrictions  No      Balance Screen   Has the patient fallen in the past 6 months  No    Has the patient had a decrease in activity level because of a fear of falling?   Yes   cane in community, sometimes in house   Is the patient reluctant to leave their home because of a fear of falling?   No      Home Environment   Living Environment  Private residence    Living Arrangements  Alone    Type of [place]    Additional Comments  tub chair b/c can't stand too long       Prior Function   Level of Independence  Independent with basic ADLs    Vocation  Retired    Leisure  read, watch TV       Observation/Other Assessments   Focus on Therapeutic Outcomes (FOTO)   54% limitation       AROM   Right Shoulder Flexion  158 Degrees    Right Shoulder ABduction  162 Degrees    Right Shoulder Internal Rotation  --   T1   Right Shoulder External Rotation  37 Degrees    Left Shoulder Flexion  0 Degrees    Left Shoulder ABduction  0 Degrees    Left Shoulder Internal Rotation  --    L4   Left Shoulder External Rotation  0 Degrees      PROM   Overall PROM Comments  passive left flexion in supine to 90 degrees, abduction to 60 degees, external rotation to 40 degrees       Strength   Overall Strength Comments  biceps 3/5    Right Shoulder Flexion  4/5    Right Shoulder Extension  4/5    Right Shoulder ABduction  4/5    Right Shoulder Internal Rotation  4/5    Right Shoulder External Rotation  4/5    Left Shoulder Flexion  0/5    Left Shoulder Extension  3-/5    Left Shoulder ABduction  0/5    Left Shoulder Internal Rotation  0/5    Left Shoulder External Rotation  0/5      Lag time at 0 degrees   Findings  Positive    Side  Left      Drop Arm test   Findings  Positive    Side  Left                Objective measurements completed on examination: See above findings.              PT Education - 02/03/20 1842    Education Details  Access Code: [email or web address] supine clasped hand elevation;  yellow band rows and extensions    Person(s) Educated  Patient    Methods  Explanation;Demonstration;Handout    Comprehension  Returned demonstration;Verbalized understanding       PT Short Term Goals - 02/03/20 2112      PT SHORT TERM GOAL #1   Title  The patient will demonstrate compliance with initial HEP and basic self care    Time  4    Period  Weeks    Status  New    Target Date  03/02/20      PT SHORT TERM GOAL #2   Title  The patient will report a 25% improvement in shoulder pain and function    Time  4    Period  Weeks    Status  New        PT Long Term Goals - 02/03/20 2115      PT LONG TERM GOAL #1   Title  The patient will be independent in HEP for self management    Time  8    Period  Weeks    Status  New    Target Date  03/30/20      PT LONG TERM GOAL #2   Title  The patient will report a 40% improvement in left shoulder pain and function with ADLs    Time  8    Period  Weeks    Status  New      PT LONG TERM GOAL #3    Title  The patient will be able to use her left UE > 50% of the time with  eating, light meal prep and grooming/dressing tasks    Time  8    Period  Weeks    Status  New      PT LONG TERM GOAL #4   Title  The patient will have an improved FOTO functional outcome score from 54% limitation to 38%    Time  8    Period  Weeks    Status  New             Plan - 02/03/20 1842    Clinical Impression Statement  The patient reports the onset of difficulty elevating her left arm (dominant side) in February for no apparent reason.  She was diagnosed with shingles at that time and had cardiac and bloodwork up.  She had an x-ray which indicated arthritis of AC joint.  She reports she is unable to reach for things, change her bed sheets or hold a pot with her left UE.  In the seated position she is unable to raise her arm forward or out to the side at all.  She has 0 degrees of external rotation as well.  In supine, passively she has 90 degrees flexion but she is unable to keep her arm elevated actively.  Passive abduction 60 degrees and passive external rotation 40 degrees.  + drop arm test and lag signs.  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  Although full ROM is unlikely, she may benefit from strengthening of deltoids, biceps and triceps (supportive musculature) to improve function since she lives alone.    Personal Factors and Comorbidities  Age;Comorbidity 1;Comorbidity 2;Fitness;Transportation    Comorbidities  HTN; vascular issues in right LE; unable to drive right now; advanced age affecting tissue quality    Examination-Activity Limitations  Bathing;Reach Overhead;Carry;Sleep;Lift;Hygiene/Grooming;Dressing    Examination-Participation Restrictions  Meal Prep;Community Activity;Driving;Cleaning;Laundry    Stability/Clinical Decision Making  Evolving/Moderate complexity    Clinical Decision Making  Moderate    Rehab Potential  Good    PT Frequency  1x / week    PT  Duration  8 weeks    PT Treatment/Interventions  ADLs/Self Care Home Management;Cryotherapy;Electrical Stimulation;Ultrasound;Moist Heat;Iontophoresis 4mg /ml Dexamethasone;Therapeutic activities;Therapeutic exercise;Manual techniques;Patient/family education;Taping    PT Next Visit Plan  review yellow band rows, extensions and supine clasped hand elevation;  start isometrics, bicep curls; low level UE Ranger    PT Home Exercise Plan  Access Code: [email or web address]       Patient will benefit from skilled therapeutic intervention in order to improve the following deficits and impairments:  Decreased range of motion, Impaired UE functional use, Decreased activity tolerance, Impaired perceived functional ability, Pain,  Decreased strength  Visit Diagnosis: Stiffness of left shoulder, not elsewhere classified - Plan: PT plan of care cert/re-cert  Muscle weakness (generalized) - Plan: PT plan of care cert/re-cert  Left shoulder pain, unspecified chronicity - Plan: PT plan of care cert/re-cert     Problem List Patient Active Problem List   Diagnosis Date Noted  . 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 Sinus tachycardia 01/30/2020  . SOB (shortness of breath) on exertion 01/30/2020  . Hypercalcemia 01/30/2020  . PAD (peripheral artery disease) ([place]) 07/29/2019  . Tobacco abuse 03/14/2012  . LIVER FUNCTION TESTS, ABNORMAL, HX OF 09/21/2009  . Dyslipidemia 09/20/2009  . ALLERGIC RHINITIS 09/20/2009  . Essential hypertension 06/06/2007  . COLONIC POLYPS, HX OF 06/06/2007   Ruben Im, PT 02/03/20 9:22 PM Phone: [phone] Fax: [phone] Alvera Singh 02/03/2020, 9:21 PM  [facility] [address], [place], [place], [address] Phone: [phone]   Fax:  [phone]  Name: Tracy Davidson MRN: [id number] Date of Birth: 1941/07/18

## 2020-02-03 NOTE — Telephone Encounter (Signed)
-----   Message from Richardo Priest, MD sent at 02/03/2020 12:13 PM EDT ----- Normal or stable result  Normal, the rapid heart rate is not due to underlying heart [place].

## 2020-02-03 NOTE — Telephone Encounter (Signed)
Spoke with pt verbalized understanding of her lab results and Dr Volanda Napoleon recommendations

## 2020-02-03 NOTE — Patient Instructions (Signed)
Access Code: [email or web address] URL: [email or web address] Date: 02/03/2020 Prepared by: Ruben Im  Exercises Supine Shoulder Flexion AAROM with Hands Clasped - 1 x daily - 7 x weekly - 1 sets - 10 reps Standing Single Arm Row with Resistance Thumb Up - 1 x daily - 7 x weekly - 1 sets - 10 reps Single Arm Shoulder Extension with Anchored Resistance - 1 x daily - 7 x weekly - 10 reps - 3 sets

## 2020-02-04 NOTE — Telephone Encounter (Signed)
Spoke with patient regarding results.  Patient verbalizes understanding and is agreeable to plan of care. Advised patient to call back with any issues or concerns.  

## 2020-02-05 ENCOUNTER — Other Ambulatory Visit: Payer: Self-pay | Admitting: Urology

## 2020-02-05 DIAGNOSIS — C678 Malignant neoplasm of overlapping sites of bladder: Secondary | ICD-10-CM | POA: Diagnosis not present

## 2020-02-07 ENCOUNTER — Encounter: Payer: Self-pay | Admitting: Cardiology

## 2020-02-09 ENCOUNTER — Ambulatory Visit: Payer: Medicare Other | Admitting: Physical Therapy

## 2020-02-09 ENCOUNTER — Other Ambulatory Visit: Payer: Self-pay

## 2020-02-09 ENCOUNTER — Encounter: Payer: Self-pay | Admitting: Physical Therapy

## 2020-02-09 DIAGNOSIS — M6281 Muscle weakness (generalized): Secondary | ICD-10-CM | POA: Diagnosis not present

## 2020-02-09 DIAGNOSIS — M25512 Pain in left shoulder: Secondary | ICD-10-CM | POA: Diagnosis not present

## 2020-02-09 DIAGNOSIS — M25612 Stiffness of left shoulder, not elsewhere classified: Secondary | ICD-10-CM

## 2020-02-09 NOTE — Patient Instructions (Signed)
Access Code: [email or web address]: [email or web address]: 04/26/2021Prepared by: [place]  Seated Shoulder Circles AAROM with [place] into Wall - 2 x daily - 7 x weekly - 1 sets - 10 reps  Seated Shoulder Flexion Extension AAROM with [place] into Wall - 2 x daily - 7 x weekly - 1 sets - 10 reps  Seated Shoulder External Rotation AAROM with [place] - 2 x daily - 7 x weekly - 1 sets - 10 reps  Supine Shoulder Flexion AAROM with Hands Clasped - 3 x daily - 7 x weekly - 1 sets - 10 reps  Sidelying Shoulder Flexion 15 Degrees - 1 x daily - 7 x weekly - 1 sets - 10 reps  Seated Shoulder Flexion Slide at Table Top with Forearm in Neutral - 1 x daily - 7 x weekly - 1 sets - 10 reps

## 2020-02-09 NOTE — Therapy (Signed)
[facility] [address] [place], [place], [address] Phone: [phone]   Fax:  [phone]  Physical Therapy Treatment  Patient Details  Name: Tracy Davidson MRN: [id number] Date of Birth: Jan 15, 1941 Referring Provider (PT): Dr. [name]    Encounter Date: 02/09/2020  PT End of Session - 02/09/20 1619    Visit Number  2    Date for PT Re-Evaluation  03/30/20    Authorization Type  [employer]HC Medicare    PT Start Time  9030    PT Stop Time  1615    PT Time Calculation (min)  40 min    Activity Tolerance  Patient tolerated treatment well       Past Medical History:  Diagnosis Date  . ALLERGIC RHINITIS 09/20/2009  . Allergy   . Arthritis   . Cataract    both eyes, right worse thsn left  . COLONIC POLYPS, HX OF 06/06/2007  . DYSLIPIDEMIA 09/20/2009  . HYPERTENSION 06/06/2007  . Knee pain    stiff and pain on side  . LIVER FUNCTION TESTS, ABNORMAL, HX OF    feb or march 2019    Past Surgical History:  Procedure Laterality Date  . BREAST EXCISIONAL BIOPSY Left 1975  . BREAST SURGERY     breast bx  . COLONOSCOPY    . DILATION AND CURETTAGE OF UTERUS    . TONSILLECTOMY    . TOTAL ABDOMINAL HYSTERECTOMY     complete  . 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 TRANSURETHRAL RESECTION OF BLADDER TUMOR WITH MITOMYCIN-C N/A 08/16/2018   Procedure: TRANSURETHRAL RESECTION OF BLADDER TUMOR;  Surgeon: Lucas Mallow, MD;  Location: [facility];  Service: Urology;  Laterality: N/A;    There were no vitals filed for this visit.  Subjective Assessment - 02/09/20 1542    Subjective  Pt reports she has been doing the supine exercise 3x/day. Has not done the band exercises (shoulder row/ shoulder ext)    "Nothing's really changed"    Pertinent History  had ECHO yesterday after physical with Dr. Volanda Napoleon    Limitations  House hold activities    Diagnostic tests  x-ray [facility] joint arthritis    Patient Stated Goals  use this arm better    Currently in Pain?  No/denies    Pain Score  0-No pain         [facility] PT Assessment - 02/09/20 0001      Assessment   Medical Diagnosis  left AC joint arthritis     Referring Provider (PT)  Dr. [name]     Onset Date/Surgical Date  --   February   Hand Dominance  Right    Next MD Visit  as needed    Prior Therapy  no      PROM   PROM Assessment Site  Shoulder    Right/Left Shoulder  Left    Left Shoulder Flexion  128 Degrees   supine, AAROM with assist from [place] Adult PT Treatment/Exercise - 02/09/20 0001      Exercises   Exercises  Shoulder      Shoulder Exercises: Supine   Flexion  AAROM;Both;10 reps   trial with cane; prefers RUE assist    Other Supine  Exercises  cane bench press x 3 reps; painful in Lt shoulder upon return - able to make it to 90 deg flexion      Shoulder Exercises: Seated   Row  Both;10 reps;Theraband    Theraband Level (Shoulder Row)  Level 1 (Yellow)    Row Limitations  tactile cues for form     External Rotation  AAROM;Left;10 reps   with cane   Other Seated Exercises  shoulder rolls x 5 reps each direction     Other Seated Exercises  scap retraction x 5 reps, 5 sec hold (limited motion with Lt shoulder) - given tactile cues.   Lt hand on Cane-  10 reps forward/backward (row mtion);  CW x 10, CCW x 10       Shoulder Exercises: Sidelying   External Rotation  AAROM;Left;5 reps   assist from PTA   Flexion  AROM;AAROM;Left;10 reps   to 90 deg    ABduction  AROM;AAROM;Left;10 reps    Other Sidelying Exercises  sliding Lt hand onto Lt hip x 5 reps       Shoulder Exercises: Standing   Extension  AAROM;Both;10 reps   cane behind back   Other Standing Exercises  Lt hand AROM IR, hand behind back ( to  T10) x 2 reps      Shoulder Exercises: Pulleys   Flexion  2 minutes    ABduction  2 minutes   with some discomfort in deltoid.             PT Education - 02/09/20 1737    Education Details  HEP    Person(s) Educated  Patient    Methods  Explanation;Demonstration;Verbal cues;Handout    Comprehension  Verbalized understanding;Returned demonstration       PT Short Term Goals - 02/03/20 2112      PT SHORT TERM GOAL #1   Title  The patient will demonstrate compliance with initial HEP and basic self care    Time  4    Period  Weeks    Status  New    Target Date  03/02/20      PT SHORT TERM GOAL #2   Title  The patient will report a 25% improvement in shoulder pain and function    Time  4    Period  Weeks    Status  New        PT Long Term Goals - 02/03/20 2115      PT LONG TERM GOAL #1   Title  The patient will be independent in HEP for self management    Time  8    Period  Weeks    Status  New    Target Date  03/30/20      PT LONG TERM GOAL #2   Title  The patient will report a 40% improvement in left shoulder pain and function with ADLs    Time  8    Period  Weeks    Status  New      PT LONG TERM GOAL #3   Title  The patient will be able to use her left UE > 50% of the time with  eating, light meal prep and grooming/dressing tasks    Time  8    Period  Weeks    Status  New      PT LONG TERM GOAL #4   Title  The patient will have an improved FOTO functional outcome score from 54% limitation to 38%  Time  8    Period  Weeks    Status  New            Plan - 02/09/20 1733    Clinical Impression Statement  Improved Lt shoulder PROM and AAROM. Pt reported some pain in Lt shoulder (generalized) with eccentric motions during AAROM; reduced with rest.  Continued limited AROM in Lt shoulder limiting her functional activities. Pt declined modalities.  Updated HEP to assist with improved compliance.    Personal Factors and Comorbidities  Age;Comorbidity  1;Comorbidity 2;Fitness;Transportation    Comorbidities  HTN; vascular issues in right LE; unable to drive right now; advanced age affecting tissue quality    Examination-Activity Limitations  Bathing;Reach Overhead;Carry;Sleep;Lift;Hygiene/Grooming;Dressing    Examination-Participation Restrictions  Meal Prep;Community Activity;Driving;Cleaning;Laundry    Stability/Clinical Decision Making  Evolving/Moderate complexity    Rehab Potential  Good    PT Frequency  1x / week    PT Duration  8 weeks    PT Treatment/Interventions  ADLs/Self Care Home Management;Cryotherapy;Electrical Stimulation;Ultrasound;Moist Heat;Iontophoresis 4mg /ml Dexamethasone;Therapeutic activities;Therapeutic exercise;Manual techniques;Patient/family education;Taping    PT Next Visit Plan  continue AAROM, start isometrics, bicep curls; low level UE Ranger    PT Home Exercise Plan  Access Code: [email or web address]    Consulted and Agree with Plan of Care  Patient       Patient will benefit from skilled therapeutic intervention in order to improve the following deficits and impairments:  Decreased range of motion, Impaired UE functional use, Decreased activity tolerance, Impaired perceived functional ability, Pain, Decreased strength  Visit Diagnosis: Stiffness of left shoulder, not elsewhere classified  Muscle weakness (generalized)  Left shoulder pain, unspecified chronicity     Problem List Patient Active Problem List   Diagnosis Date Noted  . 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 Sinus tachycardia 01/30/2020  . SOB (shortness of breath) on exertion 01/30/2020  . Hypercalcemia 01/30/2020  . PAD (peripheral artery disease) ([place]) 07/29/2019  . Tobacco abuse 03/14/2012  . LIVER FUNCTION TESTS, ABNORMAL, HX OF 09/21/2009  . Dyslipidemia 09/20/2009  . ALLERGIC RHINITIS 09/20/2009  . Essential hypertension 06/06/2007  . COLONIC POLYPS, HX OF 06/06/2007   Kerin Perna, PTA 02/09/20 5:39 PM  [facility] [address] [place], [place], [address] Phone: [phone]   Fax:  [phone]  Name: Tracy Davidson MRN: [id number] Date of Birth: September 30, 1941

## 2020-02-10 ENCOUNTER — Ambulatory Visit (INDEPENDENT_AMBULATORY_CARE_PROVIDER_SITE_OTHER): Payer: Medicare Other | Admitting: Vascular Surgery

## 2020-02-10 ENCOUNTER — Encounter: Payer: Self-pay | Admitting: Vascular Surgery

## 2020-02-10 VITALS — BP 155/95 | HR 120 | Temp 97.5°F | Resp 14 | Ht 63.0 in | Wt 111.3 lb

## 2020-02-10 DIAGNOSIS — I739 Peripheral vascular disease, unspecified: Secondary | ICD-10-CM

## 2020-02-10 NOTE — Progress Notes (Signed)
Patient name: Tracy Davidson MRN: [id number] DOB: [birth year] Sex: female  REASON FOR CONSULT: F/U right leg pain  HPI: Tracy Davidson is a 79 y.o. female, with history of hypertension and dyslipidemia that presents for follow-up of her right leg arterial disease.  She initially described some cramping in the calf and thigh particularly worse when walking but then other times when she is not ambulatory.  This is not improved and she feels the leg pain has actually gotten worse.  We tried to be very conservative with this as I was not certain that this was truly a vascular problem.  No pain in the foot itself.  She was sent back to her PCP to be reevaluated and states she has had no relief.  Past Medical History:  Diagnosis Date  . ALLERGIC RHINITIS 09/20/2009  . Allergy   . Arthritis   . Cataract    both eyes, right worse thsn left  . COLONIC POLYPS, HX OF 06/06/2007  . DYSLIPIDEMIA 09/20/2009  . HYPERTENSION 06/06/2007  . Knee pain    stiff and pain on side  . LIVER FUNCTION TESTS, ABNORMAL, HX OF    feb or march 2019  . Shingles 11/2019    Past Surgical History:  Procedure Laterality Date  . BREAST EXCISIONAL BIOPSY Left 1975  . BREAST SURGERY     breast bx  . COLONOSCOPY    . DILATION AND CURETTAGE OF UTERUS    . TONSILLECTOMY    . TOTAL ABDOMINAL HYSTERECTOMY     complete  . TRANSURETHRAL RESECTION OF BLADDER TUMOR N/A 09/18/2018   Procedure: RESTAGE TRANSURETHRAL RESECTION OF BLADDER TUMOR (TURBT);  Surgeon: Lucas Mallow, MD;  Location: [facility];  Service: Urology;  Laterality: N/A;  . TRANSURETHRAL RESECTION OF BLADDER TUMOR WITH MITOMYCIN-C N/A 08/16/2018   Procedure: TRANSURETHRAL RESECTION OF BLADDER TUMOR;  Surgeon: Lucas Mallow, MD;  Location: [facility];  Service: Urology;  Laterality: N/A;    Family History  Problem Relation Age of Onset  . Colon cancer Neg Hx   . Colon polyps Neg Hx   . Esophageal cancer Neg Hx    . Rectal cancer Neg Hx   . Stomach cancer Neg Hx   . Pancreatic cancer Neg Hx   . Breast cancer Neg Hx     SOCIAL HISTORY: Social History   Socioeconomic History  . Marital status: Single    Spouse name: Not on file  . Number of children: 0  . Years of education: 6 years college  . Highest education level: Master's degree (e.g., MA, MS, MEng, MEd, MSW, MBA)  Occupational History  . Occupation: retired  Tobacco Use  . Smoking status: Current Every Day Smoker    Packs/day: 1.00    Years: 50.00    Pack years: 50.00    Types: Cigarettes  . Smokeless tobacco: Never Used  Substance and Sexual Activity  . Alcohol use: Yes    Alcohol/week: 2.0 standard drinks    Types: 2 Shots of liquor per week    Comment: daily 2 -3 per day  . Drug use: No  . Sexual activity: Not on file  Other Topics Concern  . Not on file  Social History Narrative  . Not on file   Social Determinants of Health   Financial Resource Strain: Low Risk   . Difficulty of Paying Living Expenses: Not hard at all  Food Insecurity: No Food Insecurity  .  Worried About Charity fundraiser in the Last Year: Never true  . Ran Out of Food in the Last Year: Never true  Transportation Needs:   . Lack of Transportation (Medical):   Marland Kitchen Lack of Transportation (Non-Medical):   Physical Activity:   . Days of Exercise per Week:   . Minutes of Exercise per Session:   Stress:   . Feeling of Stress :   Social Connections:   . Frequency of Communication with Friends and Family:   . Frequency of Social Gatherings with Friends and Family:   . Attends Religious Services:   . Active Member of Clubs or Organizations:   . Attends Archivist Meetings:   Marland Kitchen Marital Status:   Intimate Partner Violence:   . Fear of Current or Ex-Partner:   . Emotionally Abused:   Marland Kitchen Physically Abused:   . Sexually Abused:     Allergies  Allergen Reactions  . Doxycycline     REACTION: tachycardia    Current Outpatient  Medications  Medication Sig Dispense Refill  . aspirin EC 81 MG tablet Take 1 tablet (81 mg total) by mouth daily. 30 tablet 11  . calcium carbonate (CALCIUM 600) 600 MG TABS tablet Take 600 mg by mouth 2 (two) times daily with a meal.    . Cholecalciferol (VITAMIN D) 50 MCG (2000 UT) CAPS Take 1 capsule by mouth daily.    . cilostazol (PLETAL) 100 MG tablet Take 1 tablet (100 mg total) by mouth 2 (two) times daily before a meal. 60 tablet 11  . lisinopril-hydrochlorothiazide (ZESTORETIC) 20-25 MG tablet TAKE 1 TABLET BY MOUTH EVERY DAY 90 tablet 0  . omeprazole (PRILOSEC) 20 MG capsule TAKE 1 CAPSULE BY MOUTH EVERY DAY 90 capsule 1  . potassium chloride SA (KLOR-CON M20) 20 MEQ tablet Take 1 tablet (20 mEq total) by mouth daily. 90 tablet 2   No current facility-administered medications for this visit.    REVIEW OF SYSTEMS:  [X]  denotes positive finding, [ ]  denotes negative finding Cardiac  Comments:  Chest pain or chest pressure:    Shortness of breath upon exertion:    Short of breath when lying flat:    Irregular heart rhythm:        Vascular    Pain in calf, thigh, or hip brought on by ambulation: x   Pain in feet at night that wakes you up from your sleep:     Blood clot in your veins:    Leg swelling:         Pulmonary    Oxygen at home:    Productive cough:     Wheezing:         Neurologic    Sudden weakness in arms or legs:     Sudden numbness in arms or legs:     Sudden onset of difficulty speaking or slurred speech:    Temporary loss of vision in one eye:     Problems with dizziness:         Gastrointestinal    Blood in stool:     Vomited blood:         Genitourinary    Burning when urinating:     Blood in urine:        Psychiatric    Major depression:         Hematologic    Bleeding problems:    Problems with blood clotting too easily:        Skin  Rashes or ulcers:        Constitutional    Fever or chills:      PHYSICAL EXAM: Vitals:    02/10/20 1022  BP: (!) 155/95  Pulse: (!) 120  Resp: 14  Temp: (!) 97.5 F (36.4 C)  TempSrc: Temporal  SpO2: 99%  Weight: 111 lb 4.8 oz (50.5 kg)  Height: 5\' 3"  (1.6 m)    GENERAL: The patient is a well-nourished female, in no acute distress. The vital signs are documented above. CARDIAC: There is a regular rate and rhythm.  VASCULAR:  Palpable femoral pulses bilaterally PULMONARY: There is good air exchange bilaterally without wheezing or rales. ABDOMEN: Soft and non-tender with normal pitched bowel sounds.  MUSCULOSKELETAL: There are no major deformities or cyanosis. NEUROLOGIC: No focal weakness or paresthesias are detected. SKIN: There are no ulcers or rashes noted. PSYCHIATRIC: The patient has a normal affect.  DATA:   I independently reviewed ABIs which are 0.75 on right, biphasic.  Assessment/Plan:  79 year old female with history of heavy tobacco abuse that presents with a mild to moderate peripheral arterial disease and ongoing right leg symptoms.  She does not feel the symptoms in her right leg are any better with Pletal and attempted walking therapy.  I discussed in depth with her that I am not certain this is truly a vascular problem given the description of her symptoms.  She states things are getting worse and she wants further evaluation.  As a result, I have offered arteriogram and possible intervention.  Does not feel her symptoms are manageable at this point.  Risks and benefit discussed.     Marty Heck, MD [facility] Office: [place]

## 2020-02-13 ENCOUNTER — Other Ambulatory Visit: Payer: Self-pay

## 2020-02-23 ENCOUNTER — Other Ambulatory Visit: Payer: Self-pay | Admitting: Family Medicine

## 2020-02-23 DIAGNOSIS — Z8639 Personal history of other endocrine, nutritional and metabolic disease: Secondary | ICD-10-CM

## 2020-02-25 ENCOUNTER — Ambulatory Visit: Payer: Medicare Other | Attending: Family Medicine | Admitting: Physical Therapy

## 2020-02-25 ENCOUNTER — Encounter: Payer: Self-pay | Admitting: Physical Therapy

## 2020-02-25 ENCOUNTER — Other Ambulatory Visit: Payer: Self-pay

## 2020-02-25 DIAGNOSIS — M25612 Stiffness of left shoulder, not elsewhere classified: Secondary | ICD-10-CM | POA: Diagnosis not present

## 2020-02-25 DIAGNOSIS — M25512 Pain in left shoulder: Secondary | ICD-10-CM | POA: Insufficient documentation

## 2020-02-25 DIAGNOSIS — M6281 Muscle weakness (generalized): Secondary | ICD-10-CM | POA: Diagnosis not present

## 2020-02-25 NOTE — Therapy (Signed)
[facility] [address] [place], [place], [address] Phone: [phone]   Fax:  [phone]  Physical Therapy Treatment  Patient Details  Name: Tracy Davidson MRN: [id number] Date of Birth: [birth year] Referring Provider (PT): Dr. [name]    Encounter Date: 02/25/2020  PT End of Session - 02/25/20 1109    Visit Number  3    Date for PT Re-Evaluation  03/30/20    Authorization Type  UHC Medicare    PT Start Time  1107    PT Stop Time  1150    PT Time Calculation (min)  43 min    Activity Tolerance  Patient tolerated treatment well    Behavior During Therapy  [facility] for tasks assessed/performed       Past Medical History:  Diagnosis Date  . ALLERGIC RHINITIS 09/20/2009  . Allergy   . Arthritis   . Cataract    both eyes, right worse thsn left  . COLONIC POLYPS, HX OF 06/06/2007  . DYSLIPIDEMIA 09/20/2009  . HYPERTENSION 06/06/2007  . Knee pain    stiff and pain on side  . LIVER FUNCTION TESTS, ABNORMAL, HX OF    feb or march 2019  . Shingles 11/2019    Past Surgical History:  Procedure Laterality Date  . BREAST EXCISIONAL BIOPSY Left 1975  . BREAST SURGERY     breast bx  . COLONOSCOPY    . DILATION AND CURETTAGE OF UTERUS    . TONSILLECTOMY    . TOTAL ABDOMINAL HYSTERECTOMY     complete  . 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 TRANSURETHRAL RESECTION OF BLADDER TUMOR WITH MITOMYCIN-C N/A 08/16/2018   Procedure: TRANSURETHRAL RESECTION OF BLADDER TUMOR;  Surgeon: Lucas Mallow, MD;  Location: [facility];  Service: Urology;  Laterality: N/A;    There were no vitals filed for this visit.  Subjective Assessment - 02/25/20 1107    Subjective  Sore today.    Pertinent History  had  ECHO yesterday after physical with Dr. Volanda Napoleon    Currently in Pain?  Yes    Pain Score  6     Pain Location  Shoulder    Pain Orientation  Right    Pain Descriptors / Indicators  Sore    Aggravating Factors   random    Pain Relieving Factors  exercises might help a little    Multiple Pain Sites  No                        [facility] Adult PT Treatment/Exercise - 02/25/20 0001      Shoulder Exercises: Seated   Row  AROM;Strengthening;Both;Theraband   3x5, TC to facilitate rhomboids   Theraband Level (Shoulder Row)  Level 1 (Yellow)    Flexion  --   10x with UE ranger, then side to side10,    Flexion Limitations  Fingers clasped flexion AAROM 10x2    Other Seated Exercises  shoulder rolls x 10 reps each direction     Other Seated Exercises  scap squuezes 10x    TC for correct technique     Shoulder Exercises: Isometric Strengthening   External Rotation  5X5"   TC from PTA, inhibit upper traps and facilitate the infraspi  Moist Heat Therapy   Number Minutes Moist Heat  10 Minutes    Moist Heat Location  Shoulder   Post session seated.               PT Short Term Goals - 02/03/20 2112      PT SHORT TERM GOAL #1   Title  The patient will demonstrate compliance with initial HEP and basic self care    Time  4    Period  Weeks    Status  New    Target Date  03/02/20      PT SHORT TERM GOAL #2   Title  The patient will report a 25% improvement in shoulder pain and function    Time  4    Period  Weeks    Status  New        PT Long Term Goals - 02/03/20 2115      PT LONG TERM GOAL #1   Title  The patient will be independent in HEP for self management    Time  8    Period  Weeks    Status  New    Target Date  03/30/20      PT LONG TERM GOAL #2   Title  The patient will report a 40% improvement in left shoulder pain and function with ADLs    Time  8    Period  Weeks    Status  New      PT LONG TERM GOAL #3   Title  The patient will be able  to use her left UE > 50% of the time with  eating, light meal prep and grooming/dressing tasks    Time  8    Period  Weeks    Status  New      PT LONG TERM GOAL #4   Title  The patient will have an improved FOTO functional outcome score from 54% limitation to 38%    Time  8    Period  Weeks    Status  New            Plan - 02/25/20 1141    Clinical Impression Statement  Pt arrives with similiar pain and similar complaints. Pt needs frequent tactile and verbal cuing to inhibit her upper traps and get some muscular contractions/activation of any of her posterior shoulder muscles. Pt does well with fingers clasped flexion, lifting to about 50 degrees via eyeball. Nothing appeared to be painful and pt did request to remain sitting vs supine.    Personal Factors and Comorbidities  Age;Comorbidity 1;Comorbidity 2;Fitness;Transportation    Comorbidities  HTN; vascular issues in right LE; unable to drive right now; advanced age affecting tissue quality    Examination-Activity Limitations  Bathing;Reach Overhead;Carry;Sleep;Lift;Hygiene/Grooming;Dressing    Examination-Participation Restrictions  Meal Prep;Community Activity;Driving;Cleaning;Laundry    Stability/Clinical Decision Making  Evolving/Moderate complexity    Rehab Potential  Good    PT Frequency  1x / week    PT Treatment/Interventions  ADLs/Self Care Home Management;Cryotherapy;Electrical Stimulation;Ultrasound;Moist Heat;Iontophoresis 4mg /ml Dexamethasone;Therapeutic activities;Therapeutic exercise;Manual techniques;Patient/family education;Taping    PT Next Visit Plan  continue AAROM, isometrics, bicep curls; low level UE Ranger    PT Home Exercise Plan  Access Code: [email or web address]       Patient will benefit from skilled therapeutic intervention in order to improve the following deficits and impairments:  Decreased range of motion, Impaired UE functional use, Decreased activity tolerance, Impaired perceived functional ability, Pain,  Decreased strength  Visit Diagnosis: Stiffness of left shoulder, not elsewhere classified  Muscle weakness (generalized)  Left shoulder pain, unspecified chronicity     Problem List Patient Active Problem List   Diagnosis Date Noted  . 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 Sinus tachycardia 01/30/2020  . SOB (shortness of breath) on exertion 01/30/2020  . Hypercalcemia 01/30/2020  . PAD (peripheral artery disease) ([place]) 07/29/2019  . Tobacco abuse 03/14/2012  . LIVER FUNCTION TESTS, ABNORMAL, HX OF 09/21/2009  . Dyslipidemia 09/20/2009  . ALLERGIC RHINITIS 09/20/2009  . Essential hypertension 06/06/2007  . COLONIC POLYPS, HX OF 06/06/2007    Kayleeann Huxford, PTA 02/25/2020, 1:11 PM  [facility] [address] [place], [place], [address] Phone: [phone]   Fax:  [phone]  Name: MAKAILEE NUDELMAN MRN: [id number] Date of Birth: [birth year]

## 2020-03-01 ENCOUNTER — Other Ambulatory Visit: Payer: Self-pay

## 2020-03-01 ENCOUNTER — Ambulatory Visit: Payer: Medicare Other | Admitting: Physical Therapy

## 2020-03-01 DIAGNOSIS — M6281 Muscle weakness (generalized): Secondary | ICD-10-CM | POA: Diagnosis not present

## 2020-03-01 DIAGNOSIS — M25612 Stiffness of left shoulder, not elsewhere classified: Secondary | ICD-10-CM

## 2020-03-01 DIAGNOSIS — M25512 Pain in left shoulder: Secondary | ICD-10-CM

## 2020-03-01 NOTE — Therapy (Signed)
[facility] [address] [place], [place], [address] Phone: [phone]   Fax:  [phone]  Physical Therapy Treatment  Patient Details  Name: Tracy Davidson MRN: [id number] Date of Birth: 09/02/1941 Referring Provider (PT): Dr. [name]    Encounter Date: 03/01/2020  PT End of Session - 03/01/20 1055    Visit Number  4    Date for PT Re-Evaluation  03/30/20    Authorization Type  UHC Medicare    PT Start Time  1021   pt late   PT Stop Time  1057   heat   PT Time Calculation (min)  36 min    Activity Tolerance  Patient tolerated treatment well;Patient limited by fatigue       Past Medical History:  Diagnosis Date  . ALLERGIC RHINITIS 09/20/2009  . Allergy   . Arthritis   . Cataract    both eyes, right worse thsn left  . COLONIC POLYPS, HX OF 06/06/2007  . DYSLIPIDEMIA 09/20/2009  . HYPERTENSION 06/06/2007  . Knee pain    stiff and pain on side  . LIVER FUNCTION TESTS, ABNORMAL, HX OF    feb or march 2019  . Shingles 11/2019    Past Surgical History:  Procedure Laterality Date  . BREAST EXCISIONAL BIOPSY Left 1975  . BREAST SURGERY     breast bx  . COLONOSCOPY    . DILATION AND CURETTAGE OF UTERUS    . TONSILLECTOMY    . TOTAL ABDOMINAL HYSTERECTOMY     complete  . TRANSURETHRAL RESECTION OF BLADDER TUMOR N/A 09/18/2018   Procedure: RESTAGE TRANSURETHRAL RESECTION OF BLADDER TUMOR (TURBT);  Surgeon: Lucas Mallow, MD;  Location: [facility];  Service: Urology;  Laterality: N/A;  . TRANSURETHRAL RESECTION OF BLADDER TUMOR WITH MITOMYCIN-C N/A 08/16/2018   Procedure: TRANSURETHRAL RESECTION OF BLADDER TUMOR;  Surgeon: Lucas Mallow, MD;  Location: [facility];  Service: Urology;  Laterality: N/A;    There were no vitals filed for this visit.  Subjective Assessment - 03/01/20 1022    Subjective  I'm making a little bit of improvement.  The ex's seem to  help.    Currently in Pain?  No/denies    Pain Score  0-No pain         [facility] PT Assessment - 03/01/20 0001      AROM   Left Shoulder Flexion  30 Degrees    Left Shoulder ABduction  44 Degrees    Left Shoulder Internal Rotation  --   L1   Left Shoulder External Rotation  30 Degrees                    [facility] Adult PT Treatment/Exercise - 03/01/20 0001      Shoulder Exercises: Seated   Extension  Strengthening;Left;20 reps;Theraband    Theraband Level (Shoulder Extension)  Level 1 (Yellow)    Row  Strengthening;Left;20 reps;Theraband    Theraband Level (Shoulder Row)  Level 1 (Yellow)    Other Seated Exercises  yellow band biceps 20x       Shoulder Exercises: Standing   Shoulder Elevation Limitations  wall push ups with light support of left UE on wall 15x   Other Standing Exercises  UE Ranger on 1st and 2nd steps 10x each     Other Standing Exercises  1x wall finger ladder to L16 but patient is unable to lower slowly (arms drops suddenly)  Shoulder Exercises: ROM/Strengthening   Other ROM/Strengthening Exercises  railing pillow case slides 10x    Other ROM/Strengthening Exercises  blue ball railing rolls 10x       Shoulder Exercises: Isometric Strengthening   Flexion  5X5"    Flexion Limitations  manually seated    Extension  5X5"    Extension Limitations  manually seated     ABduction  5X5"    ABduction Limitations  manually seated       Moist Heat Therapy   Number Minutes Moist Heat  5 Minutes   concurrent with ex    Moist Heat Location  Shoulder   and post session seated              PT Short Term Goals - 03/01/20 1105      PT SHORT TERM GOAL #1   Title  The patient will demonstrate compliance with initial HEP and basic self care    Status  Achieved      PT SHORT TERM GOAL #2   Title  The patient will report a 25% improvement in shoulder pain and function    Status  Achieved        PT Long Term Goals - 02/03/20 2115      PT  LONG TERM GOAL #1   Title  The patient will be independent in HEP for self management    Time  8    Period  Weeks    Status  New    Target Date  03/30/20      PT LONG TERM GOAL #2   Title  The patient will report a 40% improvement in left shoulder pain and function with ADLs    Time  8    Period  Weeks    Status  New      PT LONG TERM GOAL #3   Title  The patient will be able to use her left UE > 50% of the time with  eating, light meal prep and grooming/dressing tasks    Time  8    Period  Weeks    Status  New      PT LONG TERM GOAL #4   Title  The patient will have an improved FOTO functional outcome score from 54% limitation to 38%    Time  8    Period  Weeks    Status  New            Plan - 03/01/20 1056    Clinical Impression Statement  The patient has some improvement in shoulder ROM although still signficantly limited most likely due to rotator cuff insufficency.  Treatment focus on strengthening supportive musculature of the UE needed for improved function.   Therapist closely monitoring response with all treatment interventions.  After treatment session she reports muscle fatigue but minimal pain.  STGS met.    Comorbidities  HTN; vascular issues in right LE; unable to drive right now; advanced age affecting tissue quality    PT Frequency  1x / week    PT Duration  8 weeks    PT Treatment/Interventions  ADLs/Self Care Home Management;Cryotherapy;Electrical Stimulation;Ultrasound;Moist Heat;Iontophoresis 75m/ml Dexamethasone;Therapeutic activities;Therapeutic exercise;Manual techniques;Patient/family education;Taping    PT Next Visit Plan  yellow band rows and extensions;  continue AAROM, isometrics, bicep curls; low level UE Ranger;  pt has some upcoming medical procedures which may affect attendance    PT Home Exercise Plan  Access Code: [email or web address]      Patient  will benefit from skilled therapeutic intervention in order to improve the following deficits and  impairments:  Decreased range of motion, Impaired UE functional use, Decreased activity tolerance, Impaired perceived functional ability, Pain, Decreased strength  Visit Diagnosis: Stiffness of left shoulder, not elsewhere classified  Muscle weakness (generalized)  Left shoulder pain, unspecified chronicity     Problem List Patient Active Problem List   Diagnosis Date Noted  . 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 Sinus tachycardia 01/30/2020  . SOB (shortness of breath) on exertion 01/30/2020  . Hypercalcemia 01/30/2020  . PAD (peripheral artery disease) ([place]) 07/29/2019  . Tobacco abuse 03/14/2012  . LIVER FUNCTION TESTS, ABNORMAL, HX OF 09/21/2009  . Dyslipidemia 09/20/2009  . ALLERGIC RHINITIS 09/20/2009  . Essential hypertension 06/06/2007  . COLONIC POLYPS, HX OF 06/06/2007   Ruben Im, PT 03/01/20 11:07 AM Phone: [phone] Fax: [phone] Alvera Singh 03/01/2020, 11:06 AM  [facility] [address], [place], [place], [address] Phone: [phone]   Fax:  [phone]  Name: Tracy Davidson MRN: [id number] Date of Birth: 06/24/1941

## 2020-03-02 ENCOUNTER — Telehealth: Payer: Self-pay

## 2020-03-02 ENCOUNTER — Other Ambulatory Visit (HOSPITAL_COMMUNITY)
Admission: RE | Admit: 2020-03-02 | Discharge: 2020-03-02 | Disposition: A | Discharge status: Home / Self Care | Payer: Medicare Other | Source: Ambulatory Visit | Attending: Vascular Surgery | Admitting: Vascular Surgery

## 2020-03-02 DIAGNOSIS — Z01812 Encounter for preprocedural laboratory examination: Secondary | ICD-10-CM | POA: Insufficient documentation

## 2020-03-02 DIAGNOSIS — Z20822 Contact with and (suspected) exposure to covid-19: Secondary | ICD-10-CM | POA: Insufficient documentation

## 2020-03-02 LAB — SARS CORONAVIRUS 2 (TAT 6-24 HRS): SARS Coronavirus 2: NEGATIVE

## 2020-03-02 NOTE — Telephone Encounter (Signed)
Pt called with questions regarding procedure tomorrow/location. Pt verbalized understanding and has no further questions. Will call if anything arises.

## 2020-03-03 ENCOUNTER — Encounter (HOSPITAL_COMMUNITY)
Admission: RE | Disposition: A | Discharge status: Home / Self Care | Payer: Self-pay | Source: Home / Self Care | Attending: Vascular Surgery

## 2020-03-03 ENCOUNTER — Other Ambulatory Visit: Payer: Self-pay

## 2020-03-03 ENCOUNTER — Ambulatory Visit (HOSPITAL_COMMUNITY)
Admission: RE | Admit: 2020-03-03 | Discharge: 2020-03-03 | Disposition: A | Discharge status: Home / Self Care | Payer: Medicare Other | Attending: Vascular Surgery | Admitting: Vascular Surgery

## 2020-03-03 DIAGNOSIS — E785 Hyperlipidemia, unspecified: Secondary | ICD-10-CM | POA: Insufficient documentation

## 2020-03-03 DIAGNOSIS — I739 Peripheral vascular disease, unspecified: Secondary | ICD-10-CM | POA: Diagnosis not present

## 2020-03-03 DIAGNOSIS — I10 Essential (primary) hypertension: Secondary | ICD-10-CM | POA: Insufficient documentation

## 2020-03-03 DIAGNOSIS — Z79899 Other long term (current) drug therapy: Secondary | ICD-10-CM | POA: Diagnosis not present

## 2020-03-03 DIAGNOSIS — F17210 Nicotine dependence, cigarettes, uncomplicated: Secondary | ICD-10-CM | POA: Diagnosis not present

## 2020-03-03 DIAGNOSIS — I70211 Atherosclerosis of native arteries of extremities with intermittent claudication, right leg: Secondary | ICD-10-CM | POA: Insufficient documentation

## 2020-03-03 DIAGNOSIS — Z7982 Long term (current) use of aspirin: Secondary | ICD-10-CM | POA: Insufficient documentation

## 2020-03-03 DIAGNOSIS — Z881 Allergy status to other antibiotic agents status: Secondary | ICD-10-CM | POA: Diagnosis not present

## 2020-03-03 DIAGNOSIS — M79604 Pain in right leg: Secondary | ICD-10-CM | POA: Diagnosis not present

## 2020-03-03 HISTORY — PX: ABDOMINAL AORTOGRAM W/LOWER EXTREMITY: CATH118223

## 2020-03-03 HISTORY — PX: PERIPHERAL VASCULAR INTERVENTION: CATH118257

## 2020-03-03 LAB — POCT I-STAT, CHEM 8
BUN: 9 mg/dL (ref 8–23)
Calcium, Ion: 1.36 mmol/L (ref 1.15–1.40)
Chloride: 99 mmol/L (ref 98–111)
Creatinine, Ser: 0.6 mg/dL (ref 0.44–1.00)
Glucose, Bld: 103 mg/dL — ABNORMAL HIGH (ref 70–99)
HCT: 55 % — ABNORMAL HIGH (ref 36.0–46.0)
Hemoglobin: 18.7 g/dL — ABNORMAL HIGH (ref 12.0–15.0)
Potassium: 3.4 mmol/L — ABNORMAL LOW (ref 3.5–5.1)
Sodium: 137 mmol/L (ref 135–145)
TCO2: 33 mmol/L — ABNORMAL HIGH (ref 22–32)

## 2020-03-03 SURGERY — ABDOMINAL AORTOGRAM W/LOWER EXTREMITY
Anesthesia: LOCAL | Laterality: Right

## 2020-03-03 MED ORDER — LABETALOL HCL 5 MG/ML IV SOLN: 10.0000 mg | INTRAVENOUS | Status: DC | PRN

## 2020-03-03 MED ORDER — ONDANSETRON HCL 4 MG/2ML IJ SOLN: 4.0000 mg | Freq: Four times a day (QID) | INTRAMUSCULAR | Status: DC | PRN

## 2020-03-03 MED ORDER — ATORVASTATIN CALCIUM 10 MG PO TABS
10.0000 mg | ORAL_TABLET | Freq: Every day | ORAL | 11 refills | Status: DC
Start: 2020-03-03 — End: 2021-02-17

## 2020-03-03 MED ORDER — LIDOCAINE HCL (PF) 1 % IJ SOLN
INTRAMUSCULAR | Status: DC | PRN
  Administered 2020-03-03: 15 mL via INTRADERMAL

## 2020-03-03 MED ORDER — LABETALOL HCL 5 MG/ML IV SOLN
INTRAVENOUS | Status: DC | PRN
  Administered 2020-03-03 (×2): 10 mg via INTRAVENOUS

## 2020-03-03 MED ORDER — IODIXANOL 320 MG/ML IV SOLN
INTRAVENOUS | Status: DC | PRN
  Administered 2020-03-03: 170 mL via INTRA_ARTERIAL

## 2020-03-03 MED ORDER — SODIUM CHLORIDE 0.9 % WEIGHT BASED INFUSION: 1.0000 mL/kg/h | INTRAVENOUS | Status: DC

## 2020-03-03 MED ORDER — LABETALOL HCL 5 MG/ML IV SOLN
INTRAVENOUS | Status: AC
  Filled 2020-03-03: qty 4

## 2020-03-03 MED ORDER — METOPROLOL TARTRATE 5 MG/5ML IV SOLN
INTRAVENOUS | Status: AC
  Filled 2020-03-03: qty 5

## 2020-03-03 MED ORDER — CLOPIDOGREL BISULFATE 300 MG PO TABS
ORAL_TABLET | ORAL | Status: DC | PRN
  Administered 2020-03-03: 300 mg via ORAL

## 2020-03-03 MED ORDER — FENTANYL CITRATE (PF) 100 MCG/2ML IJ SOLN
INTRAMUSCULAR | Status: DC | PRN
  Administered 2020-03-03: 25 ug via INTRAVENOUS

## 2020-03-03 MED ORDER — ATORVASTATIN CALCIUM 10 MG PO TABS: 10.0000 mg | ORAL_TABLET | Freq: Every day | ORAL | Status: DC

## 2020-03-03 MED ORDER — SODIUM CHLORIDE 0.9 % IV SOLN: 250.0000 mL | INTRAVENOUS | Status: DC | PRN

## 2020-03-03 MED ORDER — CLOPIDOGREL BISULFATE 75 MG PO TABS: 75.0000 mg | ORAL_TABLET | Freq: Every day | ORAL | 11 refills | Status: DC

## 2020-03-03 MED ORDER — ACETAMINOPHEN 325 MG PO TABS: 650.0000 mg | ORAL_TABLET | ORAL | Status: DC | PRN

## 2020-03-03 MED ORDER — CLOPIDOGREL BISULFATE 75 MG PO TABS: 300.0000 mg | ORAL_TABLET | Freq: Once | ORAL | Status: DC

## 2020-03-03 MED ORDER — LIDOCAINE HCL (PF) 1 % IJ SOLN
INTRAMUSCULAR | Status: AC
  Filled 2020-03-03: qty 30

## 2020-03-03 MED ORDER — FENTANYL CITRATE (PF) 100 MCG/2ML IJ SOLN
INTRAMUSCULAR | Status: AC
  Filled 2020-03-03: qty 2

## 2020-03-03 MED ORDER — MIDAZOLAM HCL 2 MG/2ML IJ SOLN
INTRAMUSCULAR | Status: DC | PRN
  Administered 2020-03-03: 0.5 mg via INTRAVENOUS

## 2020-03-03 MED ORDER — HYDRALAZINE HCL 20 MG/ML IJ SOLN: 5.0000 mg | INTRAMUSCULAR | Status: DC | PRN

## 2020-03-03 MED ORDER — MIDAZOLAM HCL 2 MG/2ML IJ SOLN
INTRAMUSCULAR | Status: AC
  Filled 2020-03-03: qty 2

## 2020-03-03 MED ORDER — HEPARIN (PORCINE) IN NACL 1000-0.9 UT/500ML-% IV SOLN
INTRAVENOUS | Status: AC
  Filled 2020-03-03: qty 1000

## 2020-03-03 MED ORDER — SODIUM CHLORIDE 0.9% FLUSH: 3.0000 mL | INTRAVENOUS | Status: DC | PRN

## 2020-03-03 MED ORDER — HEPARIN SODIUM (PORCINE) 1000 UNIT/ML IJ SOLN
INTRAMUSCULAR | Status: DC | PRN
  Administered 2020-03-03: 5000 [IU] via INTRAVENOUS

## 2020-03-03 MED ORDER — SODIUM CHLORIDE 0.9 % IV SOLN: INTRAVENOUS | Status: DC

## 2020-03-03 MED ORDER — SODIUM CHLORIDE 0.9% FLUSH: 3.0000 mL | Freq: Two times a day (BID) | INTRAVENOUS | Status: DC

## 2020-03-03 MED ORDER — CLOPIDOGREL BISULFATE 75 MG PO TABS: 75.0000 mg | ORAL_TABLET | Freq: Every day | ORAL | Status: DC

## 2020-03-03 MED ORDER — HEPARIN (PORCINE) IN NACL 1000-0.9 UT/500ML-% IV SOLN
INTRAVENOUS | Status: DC | PRN
  Administered 2020-03-03 (×2): 500 mL

## 2020-03-03 SURGICAL SUPPLY — 19 items
BALLN MUSTANG 6X60X75 (BALLOONS) ×3
BALLOON MUSTANG 6X60X75 (BALLOONS) IMPLANT
CATH OMNI FLUSH 5F 65CM (CATHETERS) ×1 IMPLANT
CLOSURE MYNX CONTROL 6F/7F (Vascular Products) ×1 IMPLANT
COVER DOME SNAP 22 D (MISCELLANEOUS) ×1 IMPLANT
GLIDEWIRE ADV .035X180CM (WIRE) ×1 IMPLANT
KIT ENCORE 26 ADVANTAGE (KITS) ×1 IMPLANT
KIT MICROPUNCTURE NIT STIFF (SHEATH) ×1 IMPLANT
KIT PV (KITS) ×3 IMPLANT
SHEATH FLEX ANSEL ANG 6F 45CM (SHEATH) ×1 IMPLANT
SHEATH PINNACLE 5F 10CM (SHEATH) ×1 IMPLANT
SHEATH PINNACLE 6F 10CM (SHEATH) ×1 IMPLANT
SHEATH PROBE COVER 6X72 (BAG) ×1 IMPLANT
STENT INNOVA 7X60X130 (Permanent Stent) ×1 IMPLANT
SYR MEDRAD MARK V 150ML (SYRINGE) ×1 IMPLANT
TRANSDUCER W/STOPCOCK (MISCELLANEOUS) ×3 IMPLANT
TRAY PV CATH (CUSTOM PROCEDURE TRAY) ×3 IMPLANT
WIRE BENTSON .035X145CM (WIRE) ×1 IMPLANT
WIRE ROSEN-J .035X260CM (WIRE) ×1 IMPLANT

## 2020-03-03 NOTE — Discharge Instructions (Signed)
Continue aspirin daily and start Plavix daily.  Also recommend starting a statin daily.  Prescription for Plavix and statin sent to pharmacy.  Will arrange follow-up in 1 month in our office.   Femoral Site Care This sheet gives you information about how to care for yourself after your procedure. Your health care provider may also give you more specific instructions. If you have problems or questions, contact your health care provider. What can I expect after the procedure? After the procedure, it is common to have:  Bruising that usually fades within 1-2 weeks.  Tenderness at the site. Follow these instructions at home: Wound care  Follow instructions from your health care provider about how to take care of your insertion site. Make sure you: ? Wash your hands with soap and water before you change your bandage (dressing). If soap and water are not available, use hand sanitizer. ? Change your dressing as told by your health care provider. ? Leave stitches (sutures), skin glue, or adhesive strips in place. These skin closures may need to stay in place for 2 weeks or longer. If adhesive strip edges start to loosen and curl up, you may trim the loose edges. Do not remove adhesive strips completely unless your health care provider tells you to do that.  Do not take baths, swim, or use a hot tub until your health care provider approves.  You may shower 24-48 hours after the procedure or as told by your health care provider. ? Gently wash the site with plain soap and water. ? Pat the area dry with a clean towel. ? Do not rub the site. This may cause bleeding.  Do not apply powder or lotion to the site. Keep the site clean and dry.  Check your femoral site every day for signs of infection. Check for: ? Redness, swelling, or pain. ? Fluid or blood. ? Warmth. ? Pus or a bad smell. Activity  For the first 2-3 days after your procedure, or as long as directed: ? Avoid climbing stairs as much  as possible. ? Do not squat.  Do not lift anything that is heavier than 10 lb (4.5 kg), or the limit that you are told, until your health care provider says that it is safe.  Rest as directed. ? Avoid sitting for a long time without moving. Get up to take short walks every 1-2 hours.  Do not drive for 24 hours if you were given a medicine to help you relax (sedative). General instructions  Take over-the-counter and prescription medicines only as told by your health care provider.  Keep all follow-up visits as told by your health care provider. This is important. Contact a health care provider if you have:  A fever or chills.  You have redness, swelling, or pain around your insertion site. Get help right away if:  The catheter insertion area swells very fast.  You pass out.  You suddenly start to sweat or your skin gets clammy.  The catheter insertion area is bleeding, and the bleeding does not stop when you hold steady pressure on the area.  The area near or just beyond the catheter insertion site becomes pale, cool, tingly, or numb. These symptoms may represent a serious problem that is an emergency. Do not wait to see if the symptoms will go away. Get medical help right away. Call your local emergency services (911 in the [place] Do not drive yourself to the hospital. Summary  After the procedure, it is common  to have bruising that usually fades within 1-2 weeks.  Check your femoral site every day for signs of infection.  Do not lift anything that is heavier than 10 lb (4.5 kg), or the limit that you are told, until your health care provider says that it is safe. This information is not intended to replace advice given to you by your health care provider. Make sure you discuss any questions you have with your health care provider. Document Revised: 10/15/2017 Document Reviewed: 10/15/2017 Elsevier Patient Education  2020 [employer].

## 2020-03-03 NOTE — H&P (Signed)
History and Physical Interval Note:  03/03/2020 8:13 AM  Tracy Davidson  has presented today for surgery, with the diagnosis of PAD.  The various methods of treatment have been discussed with the patient and family. After consideration of risks, benefits and other options for treatment, the patient has consented to  Procedure(s): ABDOMINAL AORTOGRAM W/LOWER EXTREMITY (Bilateral) as a surgical intervention.  The patient's history has been reviewed, patient examined, no change in status, stable for surgery.  I have reviewed the patient's chart and labs.  Questions were answered to the patient's satisfaction.    Right leg arteriogram and possible intervention.    Tracy Davidson  Patient name: Tracy Davidson MRN: [id number] DOB: Mar 08, 1941 Sex: female  REASON FOR CONSULT: F/U right leg pain  HPI:  Tracy Davidson is a 79 y.o. female, with history of hypertension and dyslipidemia that presents for follow-up of her right leg arterial disease. She initially described some cramping in the calf and thigh particularly worse when walking but then other times when she is not ambulatory. This is not improved and she feels the leg pain has actually gotten worse. We tried to be very conservative with this as I was not certain that this was truly a vascular problem. No pain in the foot itself. She was sent back to her PCP to be reevaluated and states she has had no relief.      Past Medical History:  Diagnosis Date  . ALLERGIC RHINITIS 09/20/2009  . Allergy   . Arthritis   . Cataract    both eyes, right worse thsn left  . COLONIC POLYPS, HX OF 06/06/2007  . DYSLIPIDEMIA 09/20/2009  . HYPERTENSION 06/06/2007  . Knee pain    stiff and pain on side  . LIVER FUNCTION TESTS, ABNORMAL, HX OF    feb or march 2019  . Shingles 11/2019        Past Surgical History:  Procedure Laterality Date  . BREAST EXCISIONAL BIOPSY Left 1975  . BREAST SURGERY     breast bx  . COLONOSCOPY    . DILATION AND  CURETTAGE OF UTERUS    . TONSILLECTOMY    . TOTAL ABDOMINAL HYSTERECTOMY     complete  . 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 TRANSURETHRAL RESECTION OF BLADDER TUMOR WITH MITOMYCIN-C N/A 08/16/2018   Procedure: TRANSURETHRAL RESECTION OF BLADDER TUMOR; Surgeon: Lucas Mallow, MD; Location: [facility]; Service: Urology; Laterality: N/A;        Family History  Problem Relation Age of Onset  . Colon cancer Neg Hx   . Colon polyps Neg Hx   . Esophageal cancer Neg Hx   . Rectal cancer Neg Hx   . Stomach cancer Neg Hx   . Pancreatic cancer Neg Hx   . Breast cancer Neg Hx    SOCIAL HISTORY:  Social History        Socioeconomic History  . Marital status: Single    Spouse name: Not on file  . Number of children: 0  . Years of education: 6 years college  . Highest education level: Master's degree (e.g., MA, MS, MEng, MEd, MSW, MBA)  Occupational History  . Occupation: retired  Tobacco Use  . Smoking status: Current Every Day Smoker    Packs/day: 1.00    Years: 50.00  Pack years: 50.00    Types: Cigarettes  . Smokeless tobacco: Never Used  Substance and Sexual Activity  . Alcohol use: Yes    Alcohol/week: 2.0 standard drinks    Types: 2 Shots of liquor per week    Comment: daily 2 -3 per day  . Drug use: No  . Sexual activity: Not on file  Other Topics Concern  . Not on file  Social History Narrative  . Not on file   Social Determinants of Health      Financial Resource Strain: Low Risk   . Difficulty of Paying Living Expenses: Not hard at all  Food Insecurity: No Food Insecurity  . Worried About Charity fundraiser in the Last Year: Never true  . Ran Out of Food in the Last Year: Never true  Transportation Needs:   . Lack of Transportation (Medical):    Marland Kitchen Lack of Transportation (Non-Medical):   Physical Activity:   . Days of Exercise per Week:   . Minutes of Exercise per Session:   Stress:   . Feeling of Stress :   Social Connections:   . Frequency of Communication with Friends and Family:   . Frequency of Social Gatherings with Friends and Family:   . Attends Religious Services:   . Active Member of Clubs or Organizations:   . Attends Archivist Meetings:   Marland Kitchen Marital Status:   Intimate Partner Violence:   . Fear of Current or Ex-Partner:   . Emotionally Abused:   Marland Kitchen Physically Abused:   . Sexually Abused:         Allergies  Allergen Reactions  . Doxycycline     REACTION: tachycardia         Current Outpatient Medications  Medication Sig Dispense Refill  . aspirin EC 81 MG tablet Take 1 tablet (81 mg total) by mouth daily. 30 tablet 11  . calcium carbonate (CALCIUM 600) 600 MG TABS tablet Take 600 mg by mouth 2 (two) times daily with a meal.    . Cholecalciferol (VITAMIN D) 50 MCG (2000 UT) CAPS Take 1 capsule by mouth daily.    . cilostazol (PLETAL) 100 MG tablet Take 1 tablet (100 mg total) by mouth 2 (two) times daily before a meal. 60 tablet 11  . lisinopril-hydrochlorothiazide (ZESTORETIC) 20-25 MG tablet TAKE 1 TABLET BY MOUTH EVERY DAY 90 tablet 0  . omeprazole (PRILOSEC) 20 MG capsule TAKE 1 CAPSULE BY MOUTH EVERY DAY 90 capsule 1  . potassium chloride SA (KLOR-CON M20) 20 MEQ tablet Take 1 tablet (20 mEq total) by mouth daily. 90 tablet 2   No current facility-administered medications for this visit.   REVIEW OF SYSTEMS:  [X]  denotes positive finding, [ ]  denotes negative finding  Cardiac  Comments:  Chest pain or chest pressure:    Shortness of breath upon exertion:    Short of breath when lying flat:    Irregular heart rhythm:        Vascular    Pain in calf, thigh, or hip brought on by ambulation: x   Pain in feet at night that wakes you up from your sleep:     Blood clot in your veins:     Leg swelling:         Pulmonary    Oxygen at home:    Productive cough:     Wheezing:         Neurologic    Sudden weakness in arms or legs:  Sudden numbness in arms or legs:     Sudden onset of difficulty speaking or slurred speech:    Temporary loss of vision in one eye:     Problems with dizziness:         Gastrointestinal    Blood in stool:     Vomited blood:         Genitourinary    Burning when urinating:     Blood in urine:        Psychiatric    Major depression:         Hematologic    Bleeding problems:    Problems with blood clotting too easily:        Skin    Rashes or ulcers:        Constitutional    Fever or chills:    PHYSICAL EXAM:     Vitals:   02/10/20 1022  BP: (!) 155/95  Pulse: (!) 120  Resp: 14  Temp: (!) 97.5 F (36.4 C)  TempSrc: Temporal  SpO2: 99%  Weight: 111 lb 4.8 oz (50.5 kg)  Height: 5\' 3"  (1.6 m)   GENERAL: The patient is a well-nourished female, in no acute distress. The vital signs are documented above.  CARDIAC: There is a regular rate and rhythm.  VASCULAR:  Palpable femoral pulses bilaterally  PULMONARY: There is good air exchange bilaterally without wheezing or rales.  ABDOMEN: Soft and non-tender with normal pitched bowel sounds.  MUSCULOSKELETAL: There are no major deformities or cyanosis.  NEUROLOGIC: No focal weakness or paresthesias are detected.  SKIN: There are no ulcers or rashes noted.  PSYCHIATRIC: The patient has a normal affect.  DATA:  I independently reviewed ABIs which are 0.75 on right, biphasic.  Assessment/Plan:  79 year old female with history of heavy tobacco abuse that presents with a mild to moderate peripheral arterial disease and ongoing right leg symptoms. She does not feel the symptoms in her right leg are any better with Pletal and attempted walking therapy. I discussed in depth with her that I am not certain this is truly a vascular problem given the description of her symptoms. She  states things are getting worse and she wants further evaluation. As a result, I have offered arteriogram and possible intervention. Does not feel her symptoms are manageable at this point. Risks and benefit discussed.  Tracy Heck, MD  [facility] [place]  Office: [place]

## 2020-03-03 NOTE — Progress Notes (Signed)
Ambulated in hallway and th the bathroom to void tol well. No bleeding noted before or after ambulation.

## 2020-03-03 NOTE — Op Note (Signed)
Patient name: Tracy Davidson MRN: [id number] DOB: 03-16-1941 Sex: female  03/03/2020 Pre-operative Diagnosis: Right leg pain with concern for underlying peripheral vascular disease and failure of medical management Post-operative diagnosis:  Same Surgeon:  Marty Heck, MD Procedure Performed: 1.  Ultrasound-guided access of left common femoral artery 2.  Aortogram including catheter selection of the aorta 3.  Bilateral lower extremity arteriogram with runoff 4.  Right external iliac artery angioplasty with stent placement (primarily stented with 7 mm x 60 mm Innova postdilated with a 6 mm x 60 mm Mustang) 5.  Mynx closure of the left common femoral artery 6.  38 minutes of monitored moderate conscious sedation time  Indications: 79 year old female that was initially seen in consultation with moderate peripheral vascular disease in the right leg and very vague symptoms.  I ultimately sent her back to her primary care doctor multiple times for further evaluation of neuropathy and other etiologies with no improvement and has had continued progression of her symptoms.  She presents today for planned arteriogram and possible intervention given ABI 0.75 and inability to tolerate her symptoms.  Risks and benefits were discussed.  Findings:   Aortogram showed patent renal arteries bilaterally as well as a patent infrarenal aorta.  Bilateral common iliac arteries widely patent.  On the right she had a high-grade almost napkin ring stenosis in the external iliac artery with moderate disease proximal to the lesion over about a 40 mm segment.  Right lower extremity runoff showed a patent common femoral, profunda, SFA, popliteal and at least two-vessel runoff via the anterior tibial and peroneal.  Left lower extremity runoff showed a patent common femoral, profunda, SFA, popliteal and apparent three-vessel runoff although little bit more sluggish which may have been from her  sheath.  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  No evidence of residual stenosis.  Has preserved runoff in the right lower extremity.   Procedure:  The patient was identified in the holding area and taken to room 8.  The patient was then placed supine on the table and prepped and draped in the usual sterile fashion.  A time out was called.  Ultrasound was used to evaluate the left common femoral artery.  It was patent .  A digital ultrasound image was acquired.  A micropuncture needle was used to access the left common femoral artery under ultrasound guidance.  An 018 wire was advanced without resistance and a micropuncture sheath was placed.  The 018 wire was removed and a benson wire was placed.  The micropuncture sheath was exchanged for a 5 french sheath.  An omniflush catheter was advanced over the wire to the level of L-1.  An abdominal angiogram was obtained.  Next the catheter was pulled down and bilateral lower extremity runoff was obtained.  After evaluating all the pictures we elected to intervene on the right external iliac stenosis documented above.  A Glidewire advantage was then used with the Omni Flush catheter to select the right iliac and the lesion was crossed.  We then advanced our Omni Flush catheter into the right common iliac and got dedicated right iliac imaging.  We then placed a long 6 [place] Ansell sheath in the left groin over the aortic bifurcation.  Patient was given 100 units per kilogram heparin.  Then exchanged for a long Rosen wire down the  SFA and the sheath was pulled back to localize the lesion in the external iliac.  We selected a 7 mm x 60 mm self-expanding Innova and the lesion was primarily stented and then postdilated with a 6 mm x 60 mm Mustang.  There was no evidence of residual stenosis.  Patient had preserved runoff in the  right lower extremity.  At that point time exchanged for a short 6 French sheath in the left groin.  A mynx closure device was deployed after all other wires and catheters were removed.  She was taken to PACU in stable condition.  Plan: Patient will be discharged on aspirin Plavix and statin with follow-up in 1 month with right iliac duplex and ABIs.   Marty Heck, MD [facility] [place] Office: [place]

## 2020-03-03 NOTE — Progress Notes (Signed)
Rennis Harding called and informed of VS.

## 2020-03-04 MED FILL — Metoprolol Tartrate IV Soln 5 MG/5ML: INTRAVENOUS | Qty: 5 | Status: AC

## 2020-03-04 NOTE — Progress Notes (Addendum)
Noted pt had vascular surgery with stenting yesterday 03-03-2020 and Dr C. Clark started her on Plavix.  Called and left message with Jeannene Patella , OR scheduler for Dr Gloriann Loan, to inform Dr Gloriann Loan pt status change , procedure she had done, and that she is now taking plavix.  Pt will need clearance for surgery on 03-17-2020 and plavix.

## 2020-03-09 ENCOUNTER — Telehealth: Payer: Self-pay

## 2020-03-09 NOTE — Telephone Encounter (Signed)
Tracy Davidson with [facility] called to see if Plavix could be held for her to have a bladder biopsy done. Spoke with Dr Carlis Abbott and he said that he would like patient to stay on Plavix for a month given he put in stents. He said that after the month she will go to aspirin and that could be held if needed for the procedure.   Called and left a message advising Tracy Davidson of the above.   [name] Mindi Junker

## 2020-03-09 NOTE — Progress Notes (Addendum)
Left voicemail message with pam or scheduler for dr bell, no clearance note or plavix and aspirin  instructions from dr Carlis Abbott seen in epic for 03-17-2020 surgery

## 2020-03-10 ENCOUNTER — Ambulatory Visit: Payer: Medicare Other | Admitting: Physical Therapy

## 2020-03-10 ENCOUNTER — Other Ambulatory Visit: Payer: Self-pay

## 2020-03-10 DIAGNOSIS — M25512 Pain in left shoulder: Secondary | ICD-10-CM

## 2020-03-10 DIAGNOSIS — M6281 Muscle weakness (generalized): Secondary | ICD-10-CM

## 2020-03-10 DIAGNOSIS — M25612 Stiffness of left shoulder, not elsewhere classified: Secondary | ICD-10-CM

## 2020-03-10 NOTE — Therapy (Signed)
[facility] [address] [place], [place], [address] Phone: [phone]   Fax:  [phone]  Physical Therapy Treatment  Patient Details  Name: Tracy Davidson MRN: [id number] Date of Birth: 1940/12/19 Referring Provider (PT): Dr. [name]    Encounter Date: 03/10/2020  PT End of Session - 03/10/20 1111    Visit Number  5    Date for PT Re-Evaluation  03/30/20    Authorization Type  [employer] Medicare    PT Start Time  1111   pt late   PT Stop Time  1142    PT Time Calculation (min)  31 min    Activity Tolerance  Patient tolerated treatment well    Behavior During Therapy  [facility] for tasks assessed/performed       Past Medical History:  Diagnosis Date  . ALLERGIC RHINITIS 09/20/2009  . Allergy   . Arthritis   . Cataract    both eyes, right worse thsn left  . COLONIC POLYPS, HX OF 06/06/2007  . DYSLIPIDEMIA 09/20/2009  . HYPERTENSION 06/06/2007  . Knee pain    stiff and pain on side  . LIVER FUNCTION TESTS, ABNORMAL, HX OF    feb or march 2019  . Shingles 11/2019    Past Surgical History:  Procedure Laterality Date  . ABDOMINAL AORTOGRAM W/LOWER EXTREMITY Bilateral 03/03/2020   Procedure: ABDOMINAL AORTOGRAM W/LOWER EXTREMITY;  Surgeon: Marty Heck, MD;  Location: [facility];  Service: Cardiovascular;  Laterality: Bilateral;  . BREAST EXCISIONAL BIOPSY Left 1975  . BREAST SURGERY     breast bx  . COLONOSCOPY    . DILATION AND CURETTAGE OF UTERUS    . PERIPHERAL VASCULAR INTERVENTION Right 03/03/2020   Procedure: PERIPHERAL VASCULAR INTERVENTION;  Surgeon: Marty Heck, MD;  Location: [facility];  Service: Cardiovascular;  Laterality: Right;  . TONSILLECTOMY    . TOTAL ABDOMINAL HYSTERECTOMY     complete  . 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 TRANSURETHRAL RESECTION OF BLADDER TUMOR WITH MITOMYCIN-C N/A 08/16/2018   Procedure: TRANSURETHRAL RESECTION OF BLADDER TUMOR;  Surgeon: Lucas Mallow, MD;  Location: [facility];  Service: Urology;  Laterality: N/A;    There were no vitals filed for this visit.                     [facility] Adult PT Treatment/Exercise - 03/10/20 0001      Shoulder Exercises: Seated   Extension  Strengthening;Left;20 reps;Theraband    Theraband Level (Shoulder Extension)  Level 1 (Yellow)   VC to relax Lt upper trap   Row  Strengthening;Left;20 reps;Theraband    Theraband Level (Shoulder Row)  Level 1 (Yellow)    Flexion Limitations  Fingers clasped flexion AAROM 10x2    Other Seated Exercises  red band 10x    Other Seated Exercises  UE ranger flexion 2x10,       Shoulder Exercises: Standing   Shoulder Elevation Limitations  wall push ups with light support   10x   Other Standing Exercises  wall finger ladder to L19  5x, improved control/no dropping today      Shoulder Exercises: ROM/Strengthening   UBE (Upper Arm Bike)  L1 2 min forward    Other ROM/Strengthening Exercises  Seated blue ball rolls forrward and back on hi lo table 2x10      Shoulder Exercises: Isometric Strengthening   Flexion  5X5"    Flexion Limitations  manually seated    External Rotation  5X5"   TC from PTA, inhibit upper traps and facilitate the infraspi   Internal Rotation  5X5"               PT Short Term Goals - 03/01/20 1105      PT SHORT TERM GOAL #1   Title  The patient will demonstrate compliance with initial HEP and basic self care    Status  Achieved      PT SHORT TERM GOAL #2   Title  The patient will report a 25% improvement in shoulder pain and function    Status  Achieved        PT Long Term Goals - 02/03/20 2115      PT LONG TERM GOAL #1   Title  The patient will be independent in HEP  for self management    Time  8    Period  Weeks    Status  New    Target Date  03/30/20      PT LONG TERM GOAL #2   Title  The patient will report a 40% improvement in left shoulder pain and function with ADLs    Time  8    Period  Weeks    Status  New      PT LONG TERM GOAL #3   Title  The patient will be able to use her left UE > 50% of the time with  eating, light meal prep and grooming/dressing tasks    Time  8    Period  Weeks    Status  New      PT LONG TERM GOAL #4   Title  The patient will have an improved FOTO functional outcome score from 54% limitation to 38%    Time  8    Period  Weeks    Status  New            Plan - 03/10/20 1111    Clinical Impression Statement  Pt arrives late but pain free in her shoulder. When pt stands she is limited in how long she can stand due to leg pain.  She climbed higher on the finger ladder today with no pain and performed bicep curls with red band vs yellow. Pt still has diffuculty keeping upper traps down as she performs her rows and extensions, although this is showing improvement.    Personal Factors and Comorbidities  Age;Comorbidity 1;Comorbidity 2;Fitness;Transportation    Comorbidities  HTN; vascular issues in right LE; unable to drive right now; advanced age affecting tissue quality    Examination-Activity Limitations  Bathing;Reach Overhead;Carry;Sleep;Lift;Hygiene/Grooming;Dressing    Examination-Participation Restrictions  Meal Prep;Community Activity;Driving;Cleaning;Laundry    Stability/Clinical Decision Making  Evolving/Moderate complexity    Rehab Potential  Good    PT Frequency  1x / week    PT Duration  8 weeks    PT Treatment/Interventions  ADLs/Self Care Home Management;Cryotherapy;Electrical Stimulation;Ultrasound;Moist Heat;Iontophoresis 4mg /ml Dexamethasone;Therapeutic activities;Therapeutic exercise;Manual techniques;Patient/family education;Taping    PT Next Visit Plan  1 more visit then pt to have a  surgical procedure.    PT Home Exercise Plan  Access Code: [email or web address]    Consulted and Agree with Plan of Care  Patient       Patient will benefit from skilled therapeutic  intervention in order to improve the following deficits and impairments:  Decreased range of motion, Impaired UE functional use, Decreased activity tolerance, Impaired perceived functional ability, Pain, Decreased strength  Visit Diagnosis: Stiffness of left shoulder, not elsewhere classified  Muscle weakness (generalized)  Left shoulder pain, unspecified chronicity     Problem List Patient Active Problem List   Diagnosis Date Noted  . 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 Sinus tachycardia 01/30/2020  . SOB (shortness of breath) on exertion 01/30/2020  . Hypercalcemia 01/30/2020  . PAD (peripheral artery disease) ([place]) 07/29/2019  . Tobacco abuse 03/14/2012  . LIVER FUNCTION TESTS, ABNORMAL, HX OF 09/21/2009  . Dyslipidemia 09/20/2009  . ALLERGIC RHINITIS 09/20/2009  . Essential hypertension 06/06/2007  . COLONIC POLYPS, HX OF 06/06/2007    Bereket Gernert, PTA 03/10/2020, 11:43 AM  [facility] [address] [place], [place], [address] Phone: [phone]   Fax:  [phone]  Name: Tracy Davidson MRN: [id number] Date of Birth: [birth year]

## 2020-03-11 ENCOUNTER — Ambulatory Visit (INDEPENDENT_AMBULATORY_CARE_PROVIDER_SITE_OTHER): Payer: Medicare Other | Admitting: Family Medicine

## 2020-03-11 ENCOUNTER — Encounter: Payer: Self-pay | Admitting: Family Medicine

## 2020-03-11 ENCOUNTER — Telehealth: Payer: Self-pay | Admitting: Family Medicine

## 2020-03-11 DIAGNOSIS — Z8639 Personal history of other endocrine, nutritional and metabolic disease: Secondary | ICD-10-CM

## 2020-03-11 DIAGNOSIS — I739 Peripheral vascular disease, unspecified: Secondary | ICD-10-CM

## 2020-03-11 DIAGNOSIS — R000 Tachycardia, unspecified: Secondary | ICD-10-CM

## 2020-03-11 LAB — BASIC METABOLIC PANEL
BUN: 10 mg/dL (ref 6–23)
CO2: 31 mEq/L (ref 19–32)
Calcium: 11 mg/dL — ABNORMAL HIGH (ref 8.4–10.5)
Chloride: 98 mEq/L (ref 96–112)
Creatinine, Ser: 0.66 mg/dL (ref 0.40–1.20)
GFR: 104.38 mL/min (ref >60.00)
Glucose, Bld: 95 mg/dL (ref 70–99)
Potassium: 4.1 mEq/L (ref 3.5–5.1)
Sodium: 136 mEq/L (ref 135–145)

## 2020-03-11 LAB — CBC WITH DIFFERENTIAL/PLATELET
Basophils Absolute: 0 10*3/uL (ref 0.0–0.1)
Basophils Relative: 0.6 % (ref 0.0–3.0)
Eosinophils Absolute: 0.1 10*3/uL (ref 0.0–0.7)
Eosinophils Relative: 0.9 % (ref 0.0–5.0)
HCT: 51.5 % — ABNORMAL HIGH (ref 36.0–46.0)
Hemoglobin: 17.4 g/dL — ABNORMAL HIGH (ref 12.0–15.0)
Lymphocytes Relative: 18.5 % (ref 12.0–46.0)
Lymphs Abs: 1.1 10*3/uL (ref 0.7–4.0)
MCHC: 33.8 g/dL (ref 30.0–36.0)
MCV: 106.2 fl — ABNORMAL HIGH (ref 78.0–100.0)
Monocytes Absolute: 0.4 10*3/uL (ref 0.1–1.0)
Monocytes Relative: 7.2 % (ref 3.0–12.0)
Neutro Abs: 4.5 10*3/uL (ref 1.4–7.7)
Neutrophils Relative %: 72.8 % (ref 43.0–77.0)
Platelets: 186 10*3/uL (ref 150.0–400.0)
RBC: 4.85 Mil/uL (ref 3.87–5.11)
RDW: 13.6 % (ref 11.5–15.5)
WBC: 6.1 10*3/uL (ref 4.0–10.5)

## 2020-03-11 LAB — ALBUMIN: Albumin: 4.4 g/dL (ref 3.5–5.2)

## 2020-03-11 LAB — VITAMIN D 25 HYDROXY (VIT D DEFICIENCY, FRACTURES): VITD: >120 ng/mL

## 2020-03-11 NOTE — Telephone Encounter (Signed)
FYI

## 2020-03-11 NOTE — Patient Instructions (Signed)
Hypercalcemia Hypercalcemia is when the level of calcium in a person's blood is above normal. The body needs calcium to make bones and keep them strong. Calcium also helps the muscles, nerves, brain, and heart work the way they should. Most of the calcium in the body is in the bones. There is also some calcium in the blood. Hypercalcemia can happen when calcium comes out of the bones, or when the kidneys are not able to remove calcium from the blood. Hypercalcemia can be mild or severe. What are the causes? There are many possible causes of hypercalcemia. Common causes of this condition include:  Hyperparathyroidism. This is a condition in which the body produces too much parathyroid hormone. There are four parathyroid glands in your neck. These glands produce a chemical messenger (hormone) that helps the body absorb calcium from foods and helps your bones release calcium.  Certain kinds of cancer. Less common causes of hypercalcemia include:  Getting too much calcium or vitamin D from your diet.  Kidney failure.  Hyperthyroidism.  Severe dehydration.  Being on bed rest or being inactive for a long time.  Certain medicines.  Infections. What increases the risk? You are more likely to develop this condition if you:  Are female.  Are 80 years of age or older.  Have a family history of hypercalcemia. What are the signs or symptoms? Mild hypercalcemia that starts slowly may not cause symptoms. Severe, sudden hypercalcemia is more likely to cause symptoms, such as:  Being more thirsty than usual.  Needing to urinate more often than usual.  Abdominal pain.  Nausea and vomiting.  Constipation.  Muscle pain, twitching, or weakness.  Feeling very tired. How is this diagnosed?  Hypercalcemia is usually diagnosed with a blood test. You may also have tests to help determine what is causing this condition, such as imaging tests and more blood tests. How is this  treated? Treatment for hypercalcemia depends on the cause. Treatment may include:  Receiving fluids through an IV.  Medicines that: ? Keep calcium levels steady after receiving fluids (loop diuretics). ? Keep calcium in your bones (bisphosphonates). ? Lower the calcium level in your blood.  Surgery to remove overactive parathyroid glands.  A procedure that filters your blood to correct calcium levels (hemodialysis). Follow these instructions at home:   Take over-the-counter and prescription medicines only as told by your health care provider.  Follow instructions from your health care provider about eating or drinking restrictions.  Drink enough fluid to keep your urine pale yellow.  Stay active. Weight-bearing exercise helps to keep calcium in your bones. Follow instructions from your health care provider about what type and level of exercise is safe for you.  Keep all follow-up visits as told by your health care provider. This is important. Contact a health care provider if you have:  A fever.  A heartbeat that is irregular or very fast.  Changes in mood, memory, or personality. Get help right away if you:  Have severe abdominal pain.  Have chest pain.  Have trouble breathing.  Become very confused and sleepy.  Lose consciousness. Summary  Hypercalcemia is when the level of calcium in a person's blood is above normal. The body needs calcium to make bones and keep them strong. Calcium also helps the muscles, nerves, brain, and heart work the way they should.  There are many possible causes of hypercalcemia, and treatment depends on the cause.  Take over-the-counter and prescription medicines only as told by your health care  provider.  Follow instructions from your health care provider about eating or drinking restrictions. This information is not intended to replace advice given to you by your health care provider. Make sure you discuss any questions you have with  your health care provider. Document Revised: 10/29/2018 Document Reviewed: 07/08/2018 Elsevier Patient Education  2020 [place].  Peripheral Vascular Disease Peripheral vascular disease (PVD) is a disease of the blood vessels. A simple term for PVD is poor circulation. In most cases, PVD narrows the blood vessels that carry blood from your heart to the rest of your body. This can result in a decreased supply of blood to your arms, legs, and internal organs, like your stomach or kidneys. However, it most often affects a person's lower legs and feet. There are two types of PVD.  Organic PVD. This is the more common type. It is caused by damage to the structure of blood vessels.  Functional PVD. This is caused by conditions that make blood vessels contract and tighten (spasm). Without treatment, PVD tends to get worse over time. PVD can also lead to acute limb ischemia. This is when an arm or leg suddenly has trouble getting enough blood. This is a medical emergency. What are the causes?  Each type of PVD has many different causes. The most common cause of PVD is buildup of a fatty material (plaque) inside your arteries (atherosclerosis). Small amounts of plaque can break off from the walls of the blood vessels and become lodged in a smaller artery. This blocks blood flow and can cause acute limb ischemia. Other common causes of PVD include:  Blood clots that form inside of blood vessels.  Injuries to blood vessels.  Diseases that cause inflammation of blood vessels or cause blood vessel spasms.  Health behaviors and health history that increase your risk of developing PVD. What increases the risk? You are more likely to develop this condition if:  You have a family history of PVD.  You have certain medical conditions, including: ? High cholesterol. ? Diabetes. ? High blood pressure (hypertension). ? Coronary heart disease. ? Past problems with blood clots. ? Past injury, such  as burns or a broken bone. These may have damaged blood vessels in your limbs. ? Buerger disease. This is caused by inflamed blood vessels in your hands and feet. ? Some forms of arthritis. ? Rare birth defects that affect the arteries in your legs. ? Kidney disease.  You use tobacco or smoke.  You do not get enough exercise.  You are obese.  You are age 63 or older. What are the signs or symptoms? This condition may cause different symptoms. Your symptoms depend on what part of your body is not getting enough blood. Some common signs and symptoms include:  Cramps in your lower legs. This may be a symptom of poor leg circulation (claudication).  Pain and weakness in your legs. This happens while you are physically active but goes away when you rest (intermittent claudication).  Leg pain when at rest.  Leg numbness, tingling, or weakness.  Coldness in a leg or foot, especially when compared with the other leg.  Skin or hair changes. These can include: ? Hair loss. ? Shiny skin. ? Pale or bluish skin. ? Thick toenails.  Inability to get or maintain an erection (erectile dysfunction).  Fatigue. People with PVD are more likely to develop ulcers and sores on their toes, feet, or legs. These may take longer than normal to heal. How is this  diagnosed? This condition is diagnosed based on:  Your signs and symptoms.  A physical exam and your medical history.  Other tests to find out what is causing your PVD and to determine its severity. Tests may include: ? Blood pressure recordings from your arms and legs and measurements of the strength of your pulses (pulse volume recordings). ? Imaging studies using sound waves to take pictures of the blood flow through your blood vessels (Doppler ultrasound). ? Injecting a dye into your blood vessels before having imaging studies using:  X-rays (angiogram or arteriogram).  Computer-generated X-rays (CT angiogram).  A powerful  electromagnetic field and a computer (magnetic resonance angiogram or MRA). How is this treated? Treatment for PVD depends on the cause of your condition and how severe your symptoms are. It also depends on your age. Underlying causes need to be treated and controlled. These include long-term (chronic) conditions, such as diabetes, high cholesterol, and high blood pressure. Treatment includes:  Lifestyle changes, such as: ? Quitting smoking. ? Exercising regularly. ? Following a low-fat, low-cholesterol diet.  Taking medicines, such as: ? Blood thinners to prevent blood clots. ? Medicines to improve blood flow. ? Medicines to improve your blood cholesterol levels.  Surgical procedures, such as: ? A procedure that uses an inflated balloon to open a blocked artery and improve blood flow (angioplasty). ? A procedure to put in a wire mesh tube to keep a blocked artery open (stent implant). ? Surgery to reroute blood flow around a blocked artery (peripheral bypass surgery). ? Surgery to remove dead tissue from an infected wound on the affected limb. ? Amputation. This is surgical removal of the affected limb. It may be necessary in cases of acute limb ischemia where there has been no improvement through medical or surgical treatments. Follow these instructions at home: Lifestyle  Do not use any products that contain nicotine or tobacco, such as cigarettes and e-cigarettes. If you need help quitting, ask your health care provider.  Lose weight if you are overweight, and maintain a healthy weight as discussed by your health care provider.  Eat a diet that is low in fat and cholesterol. If you need help, ask your health care provider.  Exercise regularly. Ask your health care provider to suggest some good activities for you. General instructions  Take over-the-counter and prescription medicines only as told by your health care provider.  Take good care of your feet: ? Wear comfortable  shoes that fit well. ? Check your feet often for any cuts or sores.  Keep all follow-up visits as told by your health care provider. This is important. Contact a health care provider if:  You have cramps in your legs while walking.  You have leg pain when you are at rest.  You have coldness in a leg or foot.  Your skin changes.  You have erectile dysfunction.  You have cuts or sores on your feet that are not healing. Get help right away if:  Your arm or leg turns cold, numb, and blue.  Your arms or legs become red, warm, swollen, painful, or numb.  You have chest pain or trouble breathing.  You suddenly have weakness in your face, arm, or leg.  You become very confused or lose the ability to speak.  You suddenly have a very bad headache or lose your vision. Summary  Peripheral vascular disease (PVD) is a disease of the blood vessels.  In most cases, PVD narrows the blood vessels that carry blood from  your heart to the rest of your body.  PVD may cause different symptoms. Your symptoms depend on what part of your body is not getting enough blood.  Treatment for PVD depends on the cause of your condition and how severe your symptoms are. This information is not intended to replace advice given to you by your health care provider. Make sure you discuss any questions you have with your health care provider. Document Revised: 09/14/2017 Document Reviewed: 11/09/2016 Elsevier Patient Education  [place].  Sinus Tachycardia  Sinus tachycardia is a kind of fast heartbeat. In sinus tachycardia, the heart beats more than 100 times a minute. Sinus tachycardia starts in a part of the heart called the sinus node. Sinus tachycardia may be harmless, or it may be a sign of a serious condition. What are the causes? This condition may be caused by:  Exercise or exertion.  A fever.  Pain.  Loss of body fluids (dehydration).  Severe bleeding (hemorrhage).  Anxiety and  stress.  Certain substances, including: ? Alcohol. ? Caffeine. ? Tobacco and nicotine products. ? Cold medicines. ? Illegal drugs.  Medical conditions including: ? Heart disease. ? An infection. ? An overactive thyroid (hyperthyroidism). ? A lack of red blood cells (anemia). What are the signs or symptoms? Symptoms of this condition include:  A feeling that the heart is beating quickly (palpitations).  Suddenly noticing your heartbeat (cardiac awareness).  Dizziness.  Tiredness (fatigue).  Shortness of breath.  Chest pain.  Nausea.  Fainting. How is this diagnosed? This condition is diagnosed with:  A physical exam.  Other tests, such as: ? Blood tests. ? An electrocardiogram (ECG). This test measures the electrical activity of the heart. ? Ambulatory cardiac monitor. This records your heartbeats for 24 hours or more. You may be referred to a heart specialist (cardiologist). How is this treated? Treatment for this condition depends on the cause or the underlying condition. Treatment may involve:  Treating the underlying condition.  Taking new medicines or changing your current medicines as told by your health care provider.  Making changes to your diet or lifestyle. Follow these instructions at home: Lifestyle   Do not use any products that contain nicotine or tobacco, such as cigarettes and e-cigarettes. If you need help quitting, ask your health care provider.  Do not use illegal drugs, such as cocaine.  Learn relaxation methods to help you when you get stressed or anxious. These include deep breathing.  Avoid caffeine or other stimulants. Alcohol use   Do not drink alcohol if: ? Your health care provider tells you not to drink. ? You are pregnant, may be pregnant, or are planning to become pregnant.  If you drink alcohol, limit how much you have: ? 0-1 drink a day for women. ? 0-2 drinks a day for men.  Be aware of how much alcohol is in your  drink. In the [place] one drink equals one typical bottle of beer (12 oz), one-half glass of wine (5 oz), or one shot of hard liquor (1 oz). General instructions  Drink enough fluids to keep your urine pale yellow.  Take over-the-counter and prescription medicines only as told by your health care provider.  Keep all follow-up visits as told by your health care provider. This is important. Contact a health care provider if you have:  A fever.  Vomiting or diarrhea that does not go away. Get help right away if you:  Have pain in your chest, upper arms, jaw, or  neck.  Become weak or dizzy.  Feel faint.  Have palpitations that do not go away. Summary  In sinus tachycardia, the heart beats more than 100 times a minute.  Sinus tachycardia may be harmless, or it may be a sign of a serious condition.  Treatment for this condition depends on the cause or the underlying condition.  Get help right away if you have pain in your chest, upper arms, jaw, or neck. This information is not intended to replace advice given to you by your health care provider. Make sure you discuss any questions you have with your health care provider. Document Revised: 11/21/2017 Document Reviewed: 11/21/2017 Elsevier Patient Education  [place].

## 2020-03-11 NOTE — Telephone Encounter (Signed)
Saa from [facility] stated that the Pt has a critical vitamin D greater than 120

## 2020-03-13 ENCOUNTER — Other Ambulatory Visit (HOSPITAL_COMMUNITY): Payer: Medicare Other

## 2020-03-13 NOTE — Telephone Encounter (Signed)
Aware. 

## 2020-03-15 ENCOUNTER — Encounter: Payer: Self-pay | Admitting: Family Medicine

## 2020-03-15 LAB — VITAMIN D 1,25 DIHYDROXY
Vitamin D 1, 25 (OH)2 Total: 17 pg/mL — ABNORMAL LOW (ref 18–72)
Vitamin D2 1, 25 (OH)2: <8 pg/mL
Vitamin D3 1, 25 (OH)2: 17 pg/mL

## 2020-03-15 LAB — PTH, INTACT AND CALCIUM
Calcium: 11.1 mg/dL — ABNORMAL HIGH (ref 8.6–10.4)
PTH: 7 pg/mL — ABNORMAL LOW (ref 14–64)

## 2020-03-15 NOTE — Progress Notes (Signed)
Subjective:    Patient ID: Tracy Davidson, female    DOB: 1941/08/15, 79 y.o.   MRN: [id number]  No chief complaint on file. Pt seen during Epic downtime.  HPI Patient was seen today for f/u.  Pt wanted to f/u on labs from 5/19.  Hypercalcemia and hypokalemia noted.  Pt mentions since last visit she saw Cardiology for tachycardia.  Pt denies CP, SOB, dizziness.  Pt had a stent placed 03/03/20 for LE PAD.  Pt with continued L shoulder stiffness and pain, seen by PT.  Has f/u with Urology next wk.  Past Medical History:  Diagnosis Date  . ALLERGIC RHINITIS 09/20/2009  . Allergy   . Arthritis   . Cataract    both eyes, right worse thsn left  . COLONIC POLYPS, HX OF 06/06/2007  . DYSLIPIDEMIA 09/20/2009  . HYPERTENSION 06/06/2007  . Knee pain    stiff and pain on side  . LIVER FUNCTION TESTS, ABNORMAL, HX OF    feb or march 2019  . Shingles 11/2019    Allergies  Allergen Reactions  . Doxycycline Palpitations    ROS General: Denies fever, chills, night sweats, changes in weight, changes in appetite HEENT: Denies headaches, ear pain, changes in vision, rhinorrhea, sore throat CV: Denies CP, palpitations, SOB, orthopnea Pulm: Denies SOB, cough, wheezing GI: Denies abdominal pain, nausea, vomiting, diarrhea, constipation GU: Denies dysuria, hematuria, frequency, vaginal discharge Msk: Denies muscle cramps, joint pains Neuro: Denies weakness, numbness, tingling Skin: Denies rashes, bruising Psych: Denies depression, anxiety, hallucinations     Objective:    Blood pressure 138/86, pulse (!) 112, temperature 97.9 F (36.6 C), temperature source Temporal, weight 109 lb (49.4 kg), SpO2 99 %.   Gen. Pleasant, well-nourished, in no distress, normal affect  HEENT: [place]/AT, face symmetric, no scleral icterus, PERRLA, EOMI, nares patent without drainage Lungs: no accessory muscle use, CTAB, no wheezes or rales Cardiovascular: tachycardia, no m/r/g, no peripheral edema Musculoskeletal:  LROM L shoulder, No deformities, no cyanosis or clubbing, normal tone Neuro:  A&Ox3, CN II-XII intact, normal gait Skin:  Warm, no lesions/ rash  Wt Readings from Last 3 Encounters:  03/11/20 109 lb (49.4 kg)  03/03/20 110 lb (49.9 kg)  02/10/20 111 lb 4.8 oz (50.5 kg)    Lab Results  Component Value Date   WBC 6.1 03/11/2020   HGB 17.4 (H) 03/11/2020   HCT 51.5 (H) 03/11/2020   PLT 186.0 03/11/2020   GLUCOSE 95 03/11/2020   CHOL 199 01/29/2020   TRIG 60.0 01/29/2020   HDL 98.90 01/29/2020   LDLDIRECT 143.2 03/14/2011   LDLCALC 88 01/29/2020   ALT 22 10/24/2017   AST 35 10/24/2017   NA 136 03/11/2020   K 4.1 03/11/2020   CL 98 03/11/2020   CREATININE 0.66 03/11/2020   BUN 10 03/11/2020   CO2 31 03/11/2020   TSH 2.35 01/29/2020   HGBA1C 4.5 (L) 01/29/2020    Assessment/Plan:  Hypercalcemia  -Ca2+ 11.5 on 01/29/20 - Plan: Vitamin D, 1,25-dihydroxy, Vitamin D, 25-hydroxy, Albumin, CBC with Differential/Platelet, PTH, Intact and Calcium  Sinus tachycardia -continue f/u with cardiology  PAD (peripheral artery disease) ([place]) -continue f/u with Vascular -continue cilostazol and plavix  History of hypokalemia  -K+ 3.4 on 03/03/20 -repeat lab -continue klor-con 20 mEq daily - Plan: Basic metabolic panel  F/u in 1 month prn  [name], MD

## 2020-03-17 ENCOUNTER — Ambulatory Visit: Payer: Medicare Other | Attending: Family Medicine | Admitting: Physical Therapy

## 2020-03-17 ENCOUNTER — Other Ambulatory Visit: Payer: Self-pay

## 2020-03-17 ENCOUNTER — Encounter: Payer: Self-pay | Admitting: Physical Therapy

## 2020-03-17 DIAGNOSIS — M25612 Stiffness of left shoulder, not elsewhere classified: Secondary | ICD-10-CM | POA: Diagnosis not present

## 2020-03-17 DIAGNOSIS — M6281 Muscle weakness (generalized): Secondary | ICD-10-CM

## 2020-03-17 DIAGNOSIS — M25512 Pain in left shoulder: Secondary | ICD-10-CM | POA: Diagnosis not present

## 2020-03-17 NOTE — Therapy (Signed)
[facility] [address] [place], [place], [address] Phone: [phone]   Fax:  [phone]  Physical Therapy Treatment  Patient Details  Name: Tracy Davidson MRN: [id number] Date of Birth: [birth year] Referring Provider (PT): Dr. [name]    Encounter Date: 03/17/2020  PT End of Session - 03/17/20 1025    Visit Number  6    Date for PT Re-Evaluation  03/30/20    Authorization Type  [employer] Medicare    PT Start Time  3614   pt late   PT Stop Time  1055    PT Time Calculation (min)  32 min    Activity Tolerance  Patient tolerated treatment well    Behavior During Therapy  [facility] for tasks assessed/performed       Past Medical History:  Diagnosis Date  . ALLERGIC RHINITIS 09/20/2009  . Allergy   . Arthritis   . Cataract    both eyes, right worse thsn left  . COLONIC POLYPS, HX OF 06/06/2007  . DYSLIPIDEMIA 09/20/2009  . HYPERTENSION 06/06/2007  . Knee pain    stiff and pain on side  . LIVER FUNCTION TESTS, ABNORMAL, HX OF    feb or march 2019  . Shingles 11/2019    Past Surgical History:  Procedure Laterality Date  . ABDOMINAL AORTOGRAM W/LOWER EXTREMITY Bilateral 03/03/2020   Procedure: ABDOMINAL AORTOGRAM W/LOWER EXTREMITY;  Surgeon: Marty Heck, MD;  Location: [facility];  Service: Cardiovascular;  Laterality: Bilateral;  . BREAST EXCISIONAL BIOPSY Left 1975  . BREAST SURGERY     breast bx  . COLONOSCOPY    . DILATION AND CURETTAGE OF UTERUS    . PERIPHERAL VASCULAR INTERVENTION Right 03/03/2020   Procedure: PERIPHERAL VASCULAR INTERVENTION;  Surgeon: Marty Heck, MD;  Location: [facility];  Service: Cardiovascular;  Laterality: Right;  . TONSILLECTOMY    . TOTAL ABDOMINAL HYSTERECTOMY     complete  . TRANSURETHRAL RESECTION OF BLADDER TUMOR N/A 09/18/2018   Procedure: RESTAGE TRANSURETHRAL RESECTION OF BLADDER TUMOR (TURBT);  Surgeon: Lucas Mallow, MD;  Location:  [facility];  Service: Urology;  Laterality: N/A;  . TRANSURETHRAL RESECTION OF BLADDER TUMOR WITH MITOMYCIN-C N/A 08/16/2018   Procedure: TRANSURETHRAL RESECTION OF BLADDER TUMOR;  Surgeon: Lucas Mallow, MD;  Location: [facility];  Service: Urology;  Laterality: N/A;    There were no vitals filed for this visit.  Subjective Assessment - 03/17/20 1028    Subjective  My legs are not good. Shoulder less painful/    Pertinent History  had ECHO yesterday after physical with Dr. Volanda Napoleon    Currently in Pain?  No/denies                        [facility] Adult PT Treatment/Exercise - 03/17/20 0001      Shoulder Exercises: Seated   Flexion Limitations  Fingers clasped flexion AAROM 10x2    Other Seated Exercises  bicep curls 2# 10x    Other Seated Exercises  UE ranger flexion 2x10,       Shoulder Exercises: ROM/Strengthening   UBE (Upper Arm Bike)  L1 3 min to end session, PTA present to monitor pain      Shoulder Exercises: Isometric Strengthening   Flexion  5X10"    External Rotation  5X10"    Internal Rotation  5X10"      Shoulder Exercises: Stretch  Other Shoulder Stretches  Lt upper trap stretch 3x 10 sec hold               PT Short Term Goals - 03/01/20 1105      PT SHORT TERM GOAL #1   Title  The patient will demonstrate compliance with initial HEP and basic self care    Status  Achieved      PT SHORT TERM GOAL #2   Title  The patient will report a 25% improvement in shoulder pain and function    Status  Achieved        PT Long Term Goals - 02/03/20 2115      PT LONG TERM GOAL #1   Title  The patient will be independent in HEP for self management    Time  8    Period  Weeks    Status  New    Target Date  03/30/20      PT LONG TERM GOAL #2   Title  The patient will report a 40% improvement in left shoulder pain and function with ADLs    Time  8    Period  Weeks    Status  New      PT LONG TERM GOAL #3    Title  The patient will be able to use her left UE > 50% of the time with  eating, light meal prep and grooming/dressing tasks    Time  8    Period  Weeks    Status  New      PT LONG TERM GOAL #4   Title  The patient will have an improved FOTO functional outcome score from 54% limitation to 38%    Time  8    Period  Weeks    Status  New            Plan - 03/17/20 1025    Clinical Impression Statement  Pt arrives late. Pt reports she continues to have issues with her legs and would prefer to sit more than stand. She is scheduled to see her urologist soon and this may involve a procedure?. Pt could perform iosmetrics longer and without pain. Pt has some minor pain with assisted exercises where she lifted her arm. No lasting pain once exercise was completed.    Personal Factors and Comorbidities  Age;Comorbidity 1;Comorbidity 2;Fitness;Transportation    Comorbidities  HTN; vascular issues in right LE; unable to drive right now; advanced age affecting tissue quality    Examination-Activity Limitations  Bathing;Reach Overhead;Carry;Sleep;Lift;Hygiene/Grooming;Dressing    Examination-Participation Restrictions  Meal Prep;Community Activity;Driving;Cleaning;Laundry    Stability/Clinical Decision Making  Evolving/Moderate complexity    Rehab Potential  Good    PT Frequency  1x / week    PT Duration  8 weeks    PT Treatment/Interventions  ADLs/Self Care Home Management;Cryotherapy;Electrical Stimulation;Ultrasound;Moist Heat;Iontophoresis 4mg /ml Dexamethasone;Therapeutic activities;Therapeutic exercise;Manual techniques;Patient/family education;Taping    PT Next Visit Plan  discuss with pt what her "schedule" is regarding her urology appt coming up., it may be her last PT visit but it may not? She does have 1 more scheduled visit.    PT Home Exercise Plan  Access Code: [email or web address]    Consulted and Agree with Plan of Care  Patient       Patient will benefit from skilled therapeutic intervention  in order to improve the following deficits and impairments:  Decreased range of motion, Impaired UE functional use, Decreased activity tolerance, Impaired perceived functional ability, Pain, Decreased strength  Visit Diagnosis: Stiffness of left shoulder, not elsewhere classified  Muscle weakness (generalized)  Left shoulder pain, unspecified chronicity     Problem List Patient Active Problem List   Diagnosis Date Noted  . 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 Sinus tachycardia 01/30/2020  . SOB (shortness of breath) on exertion 01/30/2020  . Hypercalcemia 01/30/2020  . PAD (peripheral artery disease) ([place]) 07/29/2019  . Tobacco abuse 03/14/2012  . LIVER FUNCTION TESTS, ABNORMAL, HX OF 09/21/2009  . Dyslipidemia 09/20/2009  . ALLERGIC RHINITIS 09/20/2009  . Essential hypertension 06/06/2007  . COLONIC POLYPS, HX OF 06/06/2007    Johnnye Sandford, PTA 03/17/2020, 10:58 AM  [facility] [address], [place], [place], [address] Phone: [phone]   Fax:  [phone]  Name: TAIWAN MILLON MRN: [id number] Date of Birth: 11-26-1940

## 2020-03-23 ENCOUNTER — Ambulatory Visit: Payer: Medicare Other | Admitting: Cardiology

## 2020-03-25 ENCOUNTER — Other Ambulatory Visit: Payer: Self-pay

## 2020-03-25 ENCOUNTER — Ambulatory Visit: Payer: Medicare Other | Admitting: Physical Therapy

## 2020-03-25 DIAGNOSIS — M6281 Muscle weakness (generalized): Secondary | ICD-10-CM | POA: Diagnosis not present

## 2020-03-25 DIAGNOSIS — M25512 Pain in left shoulder: Secondary | ICD-10-CM

## 2020-03-25 DIAGNOSIS — M25612 Stiffness of left shoulder, not elsewhere classified: Secondary | ICD-10-CM

## 2020-03-25 NOTE — Progress Notes (Signed)
Cardiology Office Note:    Date:  03/26/2020   ID:  [name], DOB Jan 05, 1941, MRN [id number]  PCP:  Billie Ruddy, MD  Cardiologist:  Shirlee More, MD    Referring MD: Billie Ruddy, MD    ASSESSMENT:    1. Sinus tachycardia   2. Essential hypertension   3. PAD (peripheral artery disease) (HCC)    PLAN:    In order of problems listed above:  1. Improved resting heart rate less than 100 asymptomatic no evidence of cardiomyopathy at this time I would do further cardiac diagnostic testing. 2. Stable repeat blood pressure 142/76 by me in the office continue current treatment ACE inhibitor and hydrochlorothiazide.  She takes potassium supplement 3. Improved after recent PCI and stent right lower extremity she is on a statin.   Next appointment: I will plan to see her back in the office as needed   Medication Adjustments/Labs and Tests Ordered: Current medicines are reviewed at length with the patient today.  Concerns regarding medicines are outlined above.  No orders of the defined types were placed in this encounter.  No orders of the defined types were placed in this encounter.   Chief Complaint  Patient presents with  . Follow-up    I had seen her with unexplained sinus tachycardia    History of Present Illness:    Tracy Davidson is a 79 y.o. female with a hx of sinus tachycardia last seen 01/20/20. Compliance with diet, lifestyle and medications: Yes  I reviewed the results of her echocardiogram with patient.  She does not have evidence of heart disease and is unaware of her tachycardia.  She is improved with her ambulation since she had PCI of the right lower extremity by vascular surgery.  No chest pain palpitation or syncope.  She has exertional shortness of breath.  I reviewed the angiogram report she had severe stenosis right external iliac artery with successful PCI  Echo 01/30/2019: 1. Left ventricular ejection fraction, by estimation, is 60 to  65%. The  left ventricle has normal function. The left ventricle has no regional  wall motion abnormalities. There is moderate left ventricular hypertrophy.  Left ventricular diastolic  parameters are indeterminate.  2. Right ventricular systolic function is normal. The right ventricular  size is normal. There is normal pulmonary artery systolic pressure.  3. The mitral valve is normal in structure. No evidence of mitral valve  regurgitation. No evidence of mitral stenosis.  4. The aortic valve is normal in structure. Aortic valve regurgitation is  not visualized. No aortic stenosis is present.  5. The inferior vena cava is normal in size with greater than 50%  respiratory variability, suggesting right atrial pressure of 3 mmHg. Past Medical History:  Diagnosis Date  . ALLERGIC RHINITIS 09/20/2009  . Allergy   . Arthritis   . Cataract    both eyes, right worse thsn left  . COLONIC POLYPS, HX OF 06/06/2007  . DYSLIPIDEMIA 09/20/2009  . HYPERTENSION 06/06/2007  . Knee pain    stiff and pain on side  . LIVER FUNCTION TESTS, ABNORMAL, HX OF    feb or march 2019  . Shingles 11/2019    Past Surgical History:  Procedure Laterality Date  . ABDOMINAL AORTOGRAM W/LOWER EXTREMITY Bilateral 03/03/2020   Procedure: ABDOMINAL AORTOGRAM W/LOWER EXTREMITY;  Surgeon: Marty Heck, MD;  Location: [facility];  Service: Cardiovascular;  Laterality: Bilateral;  . BREAST EXCISIONAL BIOPSY Left 1975  . BREAST SURGERY  breast bx  . COLONOSCOPY    . DILATION AND CURETTAGE OF UTERUS    . PERIPHERAL VASCULAR INTERVENTION Right 03/03/2020   Procedure: PERIPHERAL VASCULAR INTERVENTION;  Surgeon: Marty Heck, MD;  Location: [facility];  Service: Cardiovascular;  Laterality: Right;  . TONSILLECTOMY    . TOTAL ABDOMINAL HYSTERECTOMY     complete  . TRANSURETHRAL RESECTION OF BLADDER TUMOR N/A 09/18/2018   Procedure: RESTAGE TRANSURETHRAL RESECTION OF BLADDER TUMOR  (TURBT);  Surgeon: Lucas Mallow, MD;  Location: [facility];  Service: Urology;  Laterality: N/A;  . TRANSURETHRAL RESECTION OF BLADDER TUMOR WITH MITOMYCIN-C N/A 08/16/2018   Procedure: TRANSURETHRAL RESECTION OF BLADDER TUMOR;  Surgeon: Lucas Mallow, MD;  Location[facility];  Service: Urology;  Laterality: N/A;    Current Medications: Current Meds  Medication Sig  . aspirin EC 81 MG tablet Take 1 tablet (81 mg total) by mouth daily.  Marland Kitchen atorvastatin (LIPITOR) 10 MG tablet Take 1 tablet (10 mg total) by mouth daily.  . calcium carbonate (CALCIUM 600) 1500 (600 Ca) MG TABS tablet Take 1,200 mg by mouth 2 (two) times daily with a meal.   . Cholecalciferol (VITAMIN D) 125 MCG (5000 UT) CAPS Take 5,000 Units by mouth daily.   . cilostazol (PLETAL) 100 MG tablet Take 1 tablet (100 mg total) by mouth 2 (two) times daily before a meal.  . clopidogrel (PLAVIX) 75 MG tablet Take 1 tablet (75 mg total) by mouth daily.  Marland Kitchen lisinopril-hydrochlorothiazide (ZESTORETIC) 20-25 MG tablet TAKE 1 TABLET BY MOUTH EVERY DAY (Patient taking differently: Take 1 tablet by mouth daily. )  . omeprazole (PRILOSEC) 20 MG capsule TAKE 1 CAPSULE BY MOUTH EVERY DAY (Patient taking differently: Take 20 mg by mouth daily. )  . potassium chloride SA (KLOR-CON M20) 20 MEQ tablet Take 1 tablet (20 mEq total) by mouth daily.     Allergies:   Doxycycline   Social History   Socioeconomic History  . Marital status: Single    Spouse name: Not on file  . Number of children: 0  . Years of education: 6 years college  . Highest education level: Master's degree (e.g., MA, MS, MEng, MEd, MSW, MBA)  Occupational History  . Occupation: retired  Tobacco Use  . Smoking status: Current Every Day Smoker    Packs/day: 1.00    Years: 50.00    Pack years: 50.00    Types: Cigarettes  . Smokeless tobacco: Never Used  Vaping Use  . Vaping Use: Never used  Substance and Sexual Activity  .  Alcohol use: Yes    Alcohol/week: 2.0 standard drinks    Types: 2 Shots of liquor per week    Comment: daily 2 -3 per day  . Drug use: No  . Sexual activity: Not on file  Other Topics Concern  . Not on file  Social History Narrative  . Not on file   Social Determinants of Health   Financial Resource Strain: Low Risk   . Difficulty of Paying Living Expenses: Not hard at all  Food Insecurity: No Food Insecurity  . Worried About Charity fundraiser in the Last Year: Never true  . Ran Out of Food in the Last Year: Never true  Transportation Needs:   . Lack of Transportation (Medical):   Marland Kitchen Lack of Transportation (Non-Medical):   Physical Activity:   . Days of Exercise per Week:   . Minutes of Exercise per Session:  Stress:   . Feeling of Stress :   Social Connections:   . Frequency of Communication with Friends and Family:   . Frequency of Social Gatherings with Friends and Family:   . Attends Religious Services:   . Active Member of Clubs or Organizations:   . Attends Archivist Meetings:   Marland Kitchen Marital Status:      Family History: The patient's family history is negative for Colon cancer, Colon polyps, Esophageal cancer, Rectal cancer, Stomach cancer, Pancreatic cancer, and Breast cancer. ROS:   Please see the history of present illness.    All other systems reviewed and are negative.  EKGs/Labs/Other Studies Reviewed:    The following studies were reviewed today:    Recent Labs: 01/29/2020: TSH 2.35 03/11/2020: BUN 10; Creatinine, Ser 0.66; Hemoglobin 17.4; Platelets 186.0; Potassium 4.1; Sodium 136  Recent Lipid Panel    Component Value Date/Time   CHOL 199 01/29/2020 1118   TRIG 60.0 01/29/2020 1118   HDL 98.90 01/29/2020 1118   CHOLHDL 2 01/29/2020 1118   VLDL 12.0 01/29/2020 1118   LDLCALC 88 01/29/2020 1118   LDLDIRECT 143.2 03/14/2011 1111    Physical Exam:    VS:  BP (!) 164/80   Pulse 90   Ht 5\' 3"  (1.6 m)   Wt 109 lb (49.4 kg)   SpO2  97%   BMI 19.31 kg/m     Wt Readings from Last 3 Encounters:  03/26/20 109 lb (49.4 kg)  03/11/20 109 lb (49.4 kg)  03/03/20 110 lb (49.9 kg)     GEN: Frail appearance in no acute distress HEENT: Normal NECK: No JVD; No carotid bruits LYMPHATICS: No lymphadenopathy CARDIAC: RRR, no murmurs, rubs, gallops RESPIRATORY:  Clear to auscultation without rales, wheezing or rhonchi  ABDOMEN: Soft, non-tender, non-distended MUSCULOSKELETAL:  No edema; No deformity  SKIN: Warm and dry NEUROLOGIC:  Alert and oriented x 3 PSYCHIATRIC:  Normal affect    Signed, Shirlee More, MD  03/26/2020 10:51 AM    [place]

## 2020-03-25 NOTE — Therapy (Signed)
[facility] [address], [place], [place], [address] Phone: [phone]   Fax:  [phone]  Physical Therapy Treatment  Patient Details  Name: Tracy Davidson MRN: [id number] Date of Birth: 1941-03-11 Referring Provider (PT): Dr. [name]    Encounter Date: 03/25/2020   PT End of Session - 03/25/20 1122    Visit Number 7    Date for PT Re-Evaluation 03/30/20    Authorization Type U[employer] Medicare    PT Start Time 1100    PT Stop Time 1133   limited activity tolerance   PT Time Calculation (min) 33 min           Past Medical History:  Diagnosis Date  . ALLERGIC RHINITIS 09/20/2009  . Allergy   . Arthritis   . Cataract    both eyes, right worse thsn left  . COLONIC POLYPS, HX OF 06/06/2007  . DYSLIPIDEMIA 09/20/2009  . HYPERTENSION 06/06/2007  . Knee pain    stiff and pain on side  . LIVER FUNCTION TESTS, ABNORMAL, HX OF    feb or march 2019  . Shingles 11/2019    Past Surgical History:  Procedure Laterality Date  . ABDOMINAL AORTOGRAM W/LOWER EXTREMITY Bilateral 03/03/2020   Procedure: ABDOMINAL AORTOGRAM W/LOWER EXTREMITY;  Surgeon: Marty Heck, MD;  Location: [facility];  Service: Cardiovascular;  Laterality: Bilateral;  . BREAST EXCISIONAL BIOPSY Left 1975  . BREAST SURGERY     breast bx  . COLONOSCOPY    . DILATION AND CURETTAGE OF UTERUS    . PERIPHERAL VASCULAR INTERVENTION Right 03/03/2020   Procedure: PERIPHERAL VASCULAR INTERVENTION;  Surgeon: Marty Heck, MD;  Location: [facility];  Service: Cardiovascular;  Laterality: Right;  . TONSILLECTOMY    . TOTAL ABDOMINAL HYSTERECTOMY     complete  . TRANSURETHRAL RESECTION OF BLADDER TUMOR N/A 09/18/2018   Procedure: RESTAGE TRANSURETHRAL RESECTION OF BLADDER TUMOR (TURBT);  Surgeon: Lucas Mallow, MD;  Location: [facility];  Service: Urology;  Laterality: N/A;  . TRANSURETHRAL RESECTION OF  BLADDER TUMOR WITH MITOMYCIN-C N/A 08/16/2018   Procedure: TRANSURETHRAL RESECTION OF BLADDER TUMOR;  Surgeon: Lucas Mallow, MD;  Location: [facility];  Service: Urology;  Laterality: N/A;    There were no vitals filed for this visit.   Subjective Assessment - 03/25/20 1102    Subjective No pain in shoulder.  Using the cane to raise arm.  I can wash a few dishes and change the beds but I can't fold the sheets.    Currently in Pain? No/denies    Pain Score 0-No pain              [facility] PT Assessment - 03/25/20 0001      AROM   Left Shoulder Flexion 38 Degrees   110 active assisted   Left Shoulder ABduction 40 Degrees    Left Shoulder Internal Rotation --   T8   Left Shoulder External Rotation 46 Degrees      Strength   Overall Strength Comments biceps 4-/5     Left Shoulder Flexion 1/5    Left Shoulder Extension 3/5    Left Shoulder ABduction 1/5    Left Shoulder Internal Rotation 1/5    Left Shoulder External Rotation 1/5                         [facility] Adult PT Treatment/Exercise -  03/25/20 0001      Shoulder Exercises: Seated   Extension Strengthening;Left;20 reps;Theraband    Theraband Level (Shoulder Extension) Level 1 (Yellow)   VC to relax Lt upper trap   Row Strengthening;Left;20 reps;Theraband    Theraband Level (Shoulder Row) Level 1 (Yellow)    Flexion Limitations Fingers clasped flexion AAROM 10x2    Other Seated Exercises bicep curls 2# 10x    Other Seated Exercises UE ranger flexion 2x10,       Shoulder Exercises: ROM/Strengthening   UBE (Upper Arm Bike) L1 forward only 6 minutes     Ranger 1st and 2nd step 10x each     Ball on [employer] railing slides 10x     Other ROM/Strengthening Exercises seated 2# lawnmower pulls 10x 2#     Other ROM/Strengthening Exercises seated large blue ball roll on floor 20x                     PT Short Term Goals - 03/01/20 1105      PT SHORT TERM GOAL #1   Title The  patient will demonstrate compliance with initial HEP and basic self care    Status Achieved      PT SHORT TERM GOAL #2   Title The patient will report a 25% improvement in shoulder pain and function    Status Achieved             PT Long Term Goals - 02/03/20 2115      PT LONG TERM GOAL #1   Title The patient will be independent in HEP for self management    Time 8    Period Weeks    Status New    Target Date 03/30/20      PT LONG TERM GOAL #2   Title The patient will report a 40% improvement in left shoulder pain and function with ADLs    Time 8    Period Weeks    Status New      PT LONG TERM GOAL #3   Title The patient will be able to use her left UE > 50% of the time with  eating, light meal prep and grooming/dressing tasks    Time 8    Period Weeks    Status New      PT LONG TERM GOAL #4   Title The patient will have an improved FOTO functional outcome score from 54% limitation to 38%    Time 8    Period Weeks    Status New                 Plan - 03/25/20 1131    Clinical Impression Statement The patient continues to have pain with ADLS but she is able to use her arm functionally to wash a few dishes and change the sheets.  Her ROM has improved in all planes but still significantly limited consistent with rotator cuff pathology.  She is able to tolerate some standing ex's today but prefers sitting "because of her legs and balance".   Verbal cues to limit excessive compensatory shoulder shrug and to monitor pain level.  Will assess further progress toward goals next visit to determine readiness for discharge to a HEP.    Comorbidities HTN; vascular issues in right LE; unable to drive right now; advanced age affecting tissue quality    Rehab Potential Good    PT Frequency 1x / week    PT Duration 8 weeks  PT Treatment/Interventions ADLs/Self Care Home Management;Cryotherapy;Electrical Stimulation;Ultrasound;Moist Heat;Iontophoresis 4mg /ml  Dexamethasone;Therapeutic activities;Therapeutic exercise;Manual techniques;Patient/family education;Taping    PT Next Visit Plan probable discharge next visit to independent HEP;  check FOTO    PT Home Exercise Plan Access Code: [email or web address]           Patient will benefit from skilled therapeutic intervention in order to improve the following deficits and impairments:  Decreased range of motion, Impaired UE functional use, Decreased activity tolerance, Impaired perceived functional ability, Pain, Decreased strength  Visit Diagnosis: Stiffness of left shoulder, not elsewhere classified  Muscle weakness (generalized)  Left shoulder pain, unspecified chronicity     Problem List Patient Active Problem List   Diagnosis Date Noted  . 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 Sinus tachycardia 01/30/2020  . SOB (shortness of breath) on exertion 01/30/2020  . Hypercalcemia 01/30/2020  . PAD (peripheral artery disease) ([place]) 07/29/2019  . Tobacco abuse 03/14/2012  . LIVER FUNCTION TESTS, ABNORMAL, HX OF 09/21/2009  . Dyslipidemia 09/20/2009  . ALLERGIC RHINITIS 09/20/2009  . Essential hypertension 06/06/2007  . COLONIC POLYPS, HX OF 06/06/2007   Ruben Im, PT 03/25/20 5:56 PM Phone: [phone] Fax: [phone] Alvera Singh 03/25/2020, 5:56 PM  [facility] [address], [place], [place], [address] Phone: [phone]   Fax:  [phone]  Name: KITRINA MAURIN MRN: [id number] Date of Birth: 27-Jan-1941

## 2020-03-26 ENCOUNTER — Other Ambulatory Visit: Payer: Self-pay

## 2020-03-26 ENCOUNTER — Encounter: Payer: Self-pay | Admitting: Cardiology

## 2020-03-26 ENCOUNTER — Ambulatory Visit: Payer: Medicare Other | Admitting: Cardiology

## 2020-03-26 VITALS — BP 164/80 | HR 90 | Ht 63.0 in | Wt 109.0 lb

## 2020-03-26 DIAGNOSIS — R000 Tachycardia, unspecified: Secondary | ICD-10-CM

## 2020-03-26 DIAGNOSIS — I10 Essential (primary) hypertension: Secondary | ICD-10-CM

## 2020-03-26 DIAGNOSIS — I739 Peripheral vascular disease, unspecified: Secondary | ICD-10-CM | POA: Diagnosis not present

## 2020-03-26 NOTE — Patient Instructions (Signed)

## 2020-03-30 ENCOUNTER — Other Ambulatory Visit: Payer: Self-pay | Admitting: *Deleted

## 2020-03-30 DIAGNOSIS — I739 Peripheral vascular disease, unspecified: Secondary | ICD-10-CM

## 2020-04-01 ENCOUNTER — Ambulatory Visit: Payer: Medicare Other | Admitting: Physical Therapy

## 2020-04-01 ENCOUNTER — Other Ambulatory Visit: Payer: Self-pay

## 2020-04-01 DIAGNOSIS — M25612 Stiffness of left shoulder, not elsewhere classified: Secondary | ICD-10-CM | POA: Diagnosis not present

## 2020-04-01 DIAGNOSIS — M25512 Pain in left shoulder: Secondary | ICD-10-CM

## 2020-04-01 DIAGNOSIS — M6281 Muscle weakness (generalized): Secondary | ICD-10-CM

## 2020-04-01 NOTE — Therapy (Signed)
[facility] [address], [place], [place], [address] Phone: [phone]   Fax:  [phone]  Physical Therapy Treatment/Recertification/ Discharge Summary   Patient Details  Name: Tracy Davidson MRN: [id number] Date of Birth: Mar 09, 1941 Referring Provider (PT): Dr. [name]    Encounter Date: 04/01/2020   PT End of Session - 04/01/20 1124    Visit Number 8    Date for PT Re-Evaluation 04/01/20    Authorization Type UHC Medicare    PT Start Time 1100    PT Stop Time 1130   limited ex tolerance   PT Time Calculation (min) 30 min    Activity Tolerance Patient tolerated treatment well           Past Medical History:  Diagnosis Date  . ALLERGIC RHINITIS 09/20/2009  . Allergy   . Arthritis   . Cataract    both eyes, right worse thsn left  . COLONIC POLYPS, HX OF 06/06/2007  . DYSLIPIDEMIA 09/20/2009  . HYPERTENSION 06/06/2007  . Knee pain    stiff and pain on side  . LIVER FUNCTION TESTS, ABNORMAL, HX OF    feb or march 2019  . Shingles 11/2019    Past Surgical History:  Procedure Laterality Date  . ABDOMINAL AORTOGRAM W/LOWER EXTREMITY Bilateral 03/03/2020   Procedure: ABDOMINAL AORTOGRAM W/LOWER EXTREMITY;  Surgeon: Marty Heck, MD;  Location: [facility];  Service: Cardiovascular;  Laterality: Bilateral;  . BREAST EXCISIONAL BIOPSY Left 1975  . BREAST SURGERY     breast bx  . COLONOSCOPY    . DILATION AND CURETTAGE OF UTERUS    . PERIPHERAL VASCULAR INTERVENTION Right 03/03/2020   Procedure: PERIPHERAL VASCULAR INTERVENTION;  Surgeon: Marty Heck, MD;  Location: [facility];  Service: Cardiovascular;  Laterality: Right;  . TONSILLECTOMY    . TOTAL ABDOMINAL HYSTERECTOMY     complete  . TRANSURETHRAL RESECTION OF BLADDER TUMOR N/A 09/18/2018   Procedure: RESTAGE TRANSURETHRAL RESECTION OF BLADDER TUMOR (TURBT);  Surgeon: Lucas Mallow, MD;  [facility];  Service: Urology;  Laterality: N/A;  . TRANSURETHRAL RESECTION OF BLADDER TUMOR WITH MITOMYCIN-C N/A 08/16/2018   Procedure: TRANSURETHRAL RESECTION OF BLADDER TUMOR;  Surgeon: Lucas Mallow, MD;  Location: [facility];  Service: Urology;  Laterality: N/A;    There were no vitals filed for this visit.   Subjective Assessment - 04/01/20 1059    Subjective It hurts at rest and with motion.  Not bad this morning.  Some days the pain is better and then some days it's the same.    Pertinent History had ECHO yesterday after physical with Dr. Volanda Napoleon    Currently in Pain? Yes    Pain Score 5     Pain Location Shoulder    Pain Orientation Left              [facility] PT Assessment - 04/01/20 0001      Observation/Other Assessments   Focus on Therapeutic Outcomes (FOTO)  47%   was 54%      AROM   Left Shoulder Flexion 38 Degrees    Left Shoulder ABduction 38 Degrees    Left Shoulder Internal Rotation --   T10   Left Shoulder External Rotation 45 Degrees      Strength   Overall Strength Comments biceps 4-/5 , triceps 4-/5     Left Shoulder Flexion 1/5    Left Shoulder  Extension 3/5    Left Shoulder ABduction 1/5    Left Shoulder Internal Rotation 1/5    Left Shoulder External Rotation 1/5                         [facility] Adult PT Treatment/Exercise - 04/01/20 0001      Shoulder Exercises: Seated   Extension Strengthening;Left;10 reps;Theraband    Theraband Level (Shoulder Extension) Level 2 (Red)    Row Strengthening;Left;10 reps;Theraband    Theraband Level (Shoulder Row) Level 2 (Red)    Other Seated Exercises biceps 15x red band    Other Seated Exercises tricepts anchored in door 15x red band       Shoulder Exercises: ROM/Strengthening   UBE (Upper Arm Bike) L1 forward only 7 minutes                     PT Short Term Goals - 04/01/20 1133      PT SHORT TERM GOAL #1   Title The patient will demonstrate compliance  with initial HEP and basic self care    Status Achieved      PT SHORT TERM GOAL #2   Title The patient will report a 25% improvement in shoulder pain and function    Status Achieved             PT Long Term Goals - 04/01/20 1133      PT LONG TERM GOAL #1   Title The patient will be independent in HEP for self management    Status Achieved      PT LONG TERM GOAL #2   Title The patient will report a 40% improvement in left shoulder pain and function with ADLs    Status Not Met      PT LONG TERM GOAL #3   Title The patient will be able to use her left UE > 50% of the time with  eating, light meal prep and grooming/dressing tasks    Status Partially Met      PT LONG TERM GOAL #4   Title The patient will have an improved FOTO functional outcome score from 54% limitation to 38%    Status Partially Met                 Plan - 04/01/20 1125    Clinical Impression Statement The patient has made slight improvements in ROM and strength since start of care but still significantly limited as seen with rotator cuff pathology.  Her ROM measurements have plateaued recently.   Discussed continuation of active assisted shoulder ROM for home as well as strengthening supportive muscles needed for improved better function.  Her FOTO functional outcome score has improved somewhat.  Recommend discharge from PT at this time with partial goals met and limited future progress expected.     Comorbidities HTN; vascular issues in right LE; unable to drive right now; advanced age affecting tissue quality           Patient will benefit from skilled therapeutic intervention in order to improve the following deficits and impairments:  Decreased range of motion, Impaired UE functional use, Decreased activity tolerance, Impaired perceived functional ability, Pain, Decreased strength  Visit Diagnosis: Stiffness of left shoulder, not elsewhere classified - Plan: PT plan of care cert/re-cert  Muscle  weakness (generalized) - Plan: PT plan of care cert/re-cert  Left shoulder pain, unspecified chronicity - Plan: PT plan of care cert/re-cert  PHYSICAL THERAPY DISCHARGE SUMMARY  Visits from Start of Care: 8  Current functional level related to goals / functional outcomes: See clinical impressions above   Remaining deficits: As above   Education / Equipment: HEP, yellow and red band  Plan: Patient agrees to discharge.  Patient goals were partially met. Patient is being discharged due to lack of progress.  ?????         Problem List Patient Active Problem List   Diagnosis Date Noted  . 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 Sinus tachycardia 01/30/2020  . SOB (shortness of breath) on exertion 01/30/2020  . Hypercalcemia 01/30/2020  . PAD (peripheral artery disease) ([place]) 07/29/2019  . Tobacco abuse 03/14/2012  . LIVER FUNCTION TESTS, ABNORMAL, HX OF 09/21/2009  . Dyslipidemia 09/20/2009  . ALLERGIC RHINITIS 09/20/2009  . Essential hypertension 06/06/2007  . COLONIC POLYPS, HX OF 06/06/2007   Ruben Im, PT 04/01/20 11:39 AM Phone: [phone] Fax: [phone] Alvera Singh 04/01/2020, 11:36 AM  [facility] [address], [place], [place], [address] Phone: [phone]   Fax:  [phone]  Name: Tracy Davidson MRN: [id number] Date of Birth: 08-23-1941

## 2020-04-05 ENCOUNTER — Telehealth: Payer: Self-pay | Admitting: Cardiology

## 2020-04-05 NOTE — Telephone Encounter (Signed)
New message      [facility] Pre-operative Risk Assessment    HEARTCARE STAFF: - Please ensure there is not already an duplicate clearance open for this procedure. - Under Visit Info/Reason for Call, type in Other and utilize the format Clearance MM/DD/YY or Clearance TBD. Do not use dashes or single digits. - If request is for dental extraction, please clarify the # of teeth to be extracted.  Request for surgical clearance:  1. What type of surgery is being performed? Bladder Biopsy  2. When is this surgery scheduled? 04/12/2020  3. What type of clearance is required (medical clearance vs. Pharmacy clearance to hold med vs. Both)? Both  4. Are there any medications that need to be held prior to surgery and how long?Plavix and aspirin per the doctor's discretion  5. Practice name and name of physician performing surgery? [facility], Dr. Link Snuffer   6. What is the office phone number? [phone] Ext [phone]   7.   What is the office fax number? [phone]  8.   Anesthesia type (None, local, MAC, general) ? General    Maryjane Hurter 04/05/2020, 8:24 AM  _________________________________________________________________   (provider comments below)

## 2020-04-05 NOTE — Telephone Encounter (Signed)
   Primary Cardiologist: Shirlee More, MD  Chart reviewed as part of pre-operative protocol coverage.   Most recently the patient underwent below intervention by Dr. Carlis Abbott on 03/03/20.  Procedure Performed: 1.Ultrasound-guided access of left common femoral artery 2.Aortogram including catheter selection of the aorta 3.Bilateral lower extremityarteriogram withrunoff 4.Right external iliac artery angioplasty with stent placement (primarily stented with 7 mm x 60 mm Innova postdilated with a 6 mm x 60 mm Mustang) 5.Mynx closure of the left common femoral artery 6.38 minutes of monitored moderate conscious sedationtime   The patient saw Dr. Bettina Gavia 03/26/20 and was doing well on cardiac stand point. She should be cleared at acceptable risk.   Dr. Carlis Abbott, Please give your recommendations regarding holding antiplatelet therapy given recent stent.   Please forward your response to [facility] PREOP.   Thank you  Leanor Kail, PA 04/05/2020, 10:50 AM

## 2020-04-06 ENCOUNTER — Encounter: Payer: Self-pay | Admitting: Vascular Surgery

## 2020-04-06 ENCOUNTER — Ambulatory Visit (HOSPITAL_COMMUNITY)
Admission: RE | Admit: 2020-04-06 | Discharge: 2020-04-06 | Disposition: A | Discharge status: Home / Self Care | Payer: Medicare Other | Source: Ambulatory Visit | Attending: Vascular Surgery | Admitting: Vascular Surgery

## 2020-04-06 ENCOUNTER — Ambulatory Visit (INDEPENDENT_AMBULATORY_CARE_PROVIDER_SITE_OTHER): Payer: Medicare Other | Admitting: Vascular Surgery

## 2020-04-06 ENCOUNTER — Ambulatory Visit (INDEPENDENT_AMBULATORY_CARE_PROVIDER_SITE_OTHER)
Admission: RE | Admit: 2020-04-06 | Discharge: 2020-04-06 | Disposition: A | Discharge status: Home / Self Care | Payer: Medicare Other | Source: Ambulatory Visit | Attending: Vascular Surgery | Admitting: Vascular Surgery

## 2020-04-06 ENCOUNTER — Other Ambulatory Visit: Payer: Self-pay

## 2020-04-06 ENCOUNTER — Encounter (HOSPITAL_BASED_OUTPATIENT_CLINIC_OR_DEPARTMENT_OTHER): Payer: Self-pay | Admitting: Urology

## 2020-04-06 VITALS — BP 146/79 | HR 83 | Temp 97.3°F | Resp 14 | Ht 63.0 in | Wt 110.0 lb

## 2020-04-06 DIAGNOSIS — I739 Peripheral vascular disease, unspecified: Secondary | ICD-10-CM

## 2020-04-06 NOTE — Progress Notes (Signed)
Patient name: Tracy Davidson MRN: [id number] DOB: 04-20-1941 Sex: female  REASON FOR CONSULT: one month follow-up after right external iliac artery stent  HPI: Tracy Davidson is a 79 y.o. female, with history of hypertension and dyslipidemia that presents for follow-up after right external iliac stent.  She had a right external iliac angioplasty and stent with a 7 mm x 60 mm Innova on 03/03/2020.  This was in the setting of vague right leg pain with calf and thigh cramping.  I initially sent her back to the PCP to be reevaluated and she was then sent back to me for further evaluation after no further etiology could be discovered.  She feels like her leg is about the same since stenting.  No issues with access in the left groin.    Past Medical History:  Diagnosis Date  . ALLERGIC RHINITIS 09/20/2009  . Allergy   . Arthritis   . Cataract    both eyes, right worse thsn left  . COLONIC POLYPS, HX OF 06/06/2007  . DYSLIPIDEMIA 09/20/2009  . HYPERTENSION 06/06/2007  . Knee pain    stiff and pain on side  . LIVER FUNCTION TESTS, ABNORMAL, HX OF    feb or march 2019  . Shingles 11/2019    Past Surgical History:  Procedure Laterality Date  . ABDOMINAL AORTOGRAM W/LOWER EXTREMITY Bilateral 03/03/2020   Procedure: ABDOMINAL AORTOGRAM W/LOWER EXTREMITY;  Surgeon: Marty Heck, MD;  Location: [facility];  Service: Cardiovascular;  Laterality: Bilateral;  . BREAST EXCISIONAL BIOPSY Left 1975  . BREAST SURGERY     breast bx  . COLONOSCOPY    . DILATION AND CURETTAGE OF UTERUS    . PERIPHERAL VASCULAR INTERVENTION Right 03/03/2020   Procedure: PERIPHERAL VASCULAR INTERVENTION;  Surgeon: Marty Heck, MD;  Location: [facility];  Service: Cardiovascular;  Laterality: Right;  . TONSILLECTOMY    . TOTAL ABDOMINAL HYSTERECTOMY     complete  . TRANSURETHRAL RESECTION OF BLADDER TUMOR N/A 09/18/2018   Procedure: RESTAGE TRANSURETHRAL RESECTION OF BLADDER TUMOR  (TURBT);  Surgeon: Lucas Mallow, MD;  Location: [facility];  Service: Urology;  Laterality: N/A;  . TRANSURETHRAL RESECTION OF BLADDER TUMOR WITH MITOMYCIN-C N/A 08/16/2018   Procedure: TRANSURETHRAL RESECTION OF BLADDER TUMOR;  Surgeon: Lucas Mallow, MD;  Location: [facility];  Service: Urology;  Laterality: N/A;    Family History  Problem Relation Age of Onset  . Colon cancer Neg Hx   . Colon polyps Neg Hx   . Esophageal cancer Neg Hx   . Rectal cancer Neg Hx   . Stomach cancer Neg Hx   . Pancreatic cancer Neg Hx   . Breast cancer Neg Hx     SOCIAL HISTORY: Social History   Socioeconomic History  . Marital status: Single    Spouse name: Not on file  . Number of children: 0  . Years of education: 6 years college  . Highest education level: Master's degree (e.g., MA, MS, MEng, MEd, MSW, MBA)  Occupational History  . Occupation: retired  Tobacco Use  . Smoking status: Current Every Day Smoker    Packs/day: 1.00    Years: 50.00    Pack years: 50.00    Types: Cigarettes  . Smokeless tobacco: Never Used  Vaping Use  . Vaping Use: Never used  Substance and Sexual Activity  . Alcohol use: Yes    Alcohol/week: 2.0 standard drinks  Types: 2 Shots of liquor per week    Comment: daily 2 -3 per day  . Drug use: No  . Sexual activity: Not on file  Other Topics Concern  . Not on file  Social History Narrative  . Not on file   Social Determinants of Health   Financial Resource Strain: Low Risk   . Difficulty of Paying Living Expenses: Not hard at all  Food Insecurity: No Food Insecurity  . Worried About Charity fundraiser in the Last Year: Never true  . Ran Out of Food in the Last Year: Never true  Transportation Needs:   . Lack of Transportation (Medical):   Marland Kitchen Lack of Transportation (Non-Medical):   Physical Activity:   . Days of Exercise per Week:   . Minutes of Exercise per Session:   Stress:   . Feeling of Stress :    Social Connections:   . Frequency of Communication with Friends and Family:   . Frequency of Social Gatherings with Friends and Family:   . Attends Religious Services:   . Active Member of Clubs or Organizations:   . Attends Archivist Meetings:   Marland Kitchen Marital Status:   Intimate Partner Violence:   . Fear of Current or Ex-Partner:   . Emotionally Abused:   Marland Kitchen Physically Abused:   . Sexually Abused:     Allergies  Allergen Reactions  . Doxycycline Palpitations    Current Outpatient Medications  Medication Sig Dispense Refill  . aspirin EC 81 MG tablet Take 1 tablet (81 mg total) by mouth daily. 30 tablet 11  . atorvastatin (LIPITOR) 10 MG tablet Take 1 tablet (10 mg total) by mouth daily. 30 tablet 11  . calcium carbonate (CALCIUM 600) 1500 (600 Ca) MG TABS tablet Take 1,200 mg by mouth 2 (two) times daily with a meal.     . Cholecalciferol (VITAMIN D) 125 MCG (5000 UT) CAPS Take 5,000 Units by mouth daily.     . cilostazol (PLETAL) 100 MG tablet Take 1 tablet (100 mg total) by mouth 2 (two) times daily before a meal. 60 tablet 11  . clopidogrel (PLAVIX) 75 MG tablet Take 1 tablet (75 mg total) by mouth daily. 30 tablet 11  . lisinopril-hydrochlorothiazide (ZESTORETIC) 20-25 MG tablet TAKE 1 TABLET BY MOUTH EVERY DAY (Patient taking differently: Take 1 tablet by mouth daily. ) 90 tablet 0  . omeprazole (PRILOSEC) 20 MG capsule TAKE 1 CAPSULE BY MOUTH EVERY DAY (Patient taking differently: Take 20 mg by mouth daily. ) 90 capsule 1  . potassium chloride SA (KLOR-CON M20) 20 MEQ tablet Take 1 tablet (20 mEq total) by mouth daily. 90 tablet 2   No current facility-administered medications for this visit.    REVIEW OF SYSTEMS:  [X]  denotes positive finding, [ ]  denotes negative finding Cardiac  Comments:  Chest pain or chest pressure:    Shortness of breath upon exertion:    Short of breath when lying flat:    Irregular heart rhythm:        Vascular    Pain in calf,  thigh, or hip brought on by ambulation:    Pain in feet at night that wakes you up from your sleep:     Blood clot in your veins:    Leg swelling:         Pulmonary    Oxygen at home:    Productive cough:     Wheezing:  Neurologic    Sudden weakness in arms or legs:     Sudden numbness in arms or legs:     Sudden onset of difficulty speaking or slurred speech:    Temporary loss of vision in one eye:     Problems with dizziness:         Gastrointestinal    Blood in stool:     Vomited blood:         Genitourinary    Burning when urinating:     Blood in urine:        Psychiatric    Major depression:         Hematologic    Bleeding problems:    Problems with blood clotting too easily:        Skin    Rashes or ulcers:        Constitutional    Fever or chills:      PHYSICAL EXAM: Vitals:   04/06/20 0927  BP: (!) 146/79  Pulse: 83  Resp: 14  Temp: (!) 97.3 F (36.3 C)  TempSrc: Temporal  SpO2: 100%  Weight: 110 lb (49.9 kg)  Height: 5\' 3"  (1.6 m)    GENERAL: The patient is a well-nourished female, in no acute distress. The vital signs are documented above. CARDIAC: There is a regular rate and rhythm.  VASCULAR:  Palpable femoral pulses bilaterally Right DP palpable PULMONARY: There is good air exchange bilaterally without wheezing or rales. ABDOMEN: Soft and non-tender with normal pitched bowel sounds.  MUSCULOSKELETAL: There are no major deformities or cyanosis.  DATA:   I reviewed her iliac duplex and her right external iliac stent is widely patent.  ABIs are now normal 1.05 on the right triphasic and 1.10 on the left triphasic  Assessment/Plan:  79 year old female now status post right external iliac artery angioplasty with stent placement.  Discussed that her iliac duplex today shows a widely patent stent.  She has a palpable right dorsalis pedis pulse in the foot with a normal ABI and normal waveform that is triphasic at the ankle.  She is  still having these vague right leg symptoms and discussed that this is not her peripheral arterial disease given that we have now optimized her from vascular standpoint and she now has a normal exam with normal noninvasive imaging at the ankle.  Discussed that she can stop her Plavix now that she is 1 month postop for planned bladder biopsy.  She will need to stay on aspirin daily as well as a statin.  Can also stop the Pletal.  Will arrange follow-up in 6 months with ABIs and right iliac duplex for ongoing surveillance.   Marty Heck, MD [facility] Office: [place]

## 2020-04-07 ENCOUNTER — Encounter (HOSPITAL_BASED_OUTPATIENT_CLINIC_OR_DEPARTMENT_OTHER): Payer: Self-pay | Admitting: Urology

## 2020-04-07 ENCOUNTER — Other Ambulatory Visit: Payer: Self-pay | Admitting: Family Medicine

## 2020-04-07 ENCOUNTER — Other Ambulatory Visit: Payer: Self-pay

## 2020-04-07 DIAGNOSIS — I10 Essential (primary) hypertension: Secondary | ICD-10-CM

## 2020-04-07 NOTE — Progress Notes (Signed)
Spoke w/ via phone for pre-op interview---pt Lab needs dos----   I stat 8           COVID test ------04-08-2020@1130  am Arrive at -------1030 am 04-12-2020 No food after midnight, clear liquids until 930 am then npo, no milk, cremer or dairy products Medications to take morning of surgery ----- Diabetic medication -----n/a Patient Special Instructions -----none Pre-Op special Istructions -----none Patient verbalized understanding of instructions that were given at this phone interview. Patient denies shortness of breath, chest pain, fever, cough a this phone interview.  Anesthesia :  Sinus tachacardia, , htn , sob with exertion, hypercalcemia,  Pad, right leg stent placement 03-03-2020 Chart to jessica zanetto pa for review  PCP:dr shannon banks Cardiologist : dr Minus Breeding 01-30-2020 epic, cardiac clearance note bhavinkumar bhagat pa 6-21-201 epic Chest x-ray :11-24-2019 epic EKG :01-29-2020 epic Echo :02-02-2020 epic Cardiac Cath : none  Peripheral vascular catherization 03-03-2020 with right leg stent placed by dr Carlis Abbott Vascular dr Maxwell Marion 04-06-2020 epic Vas US aorta 6-22-201 epic Activity level: can climb 6 steps, does own vaccuming and light housework without difficulty Sleep Study/ CPAP :none Fasting Blood Sugar :      / Checks Blood Sugar -- times a day:  n/a Blood Thinner/ Instructions /Last Dose: patient instructed by dr Carlis Abbott to stop pletal and plvix on 04-06-2020 ASA / Instructions/ Last Dose : patient instructed to stay on 81 mg aspirin by dr Carlis Abbott

## 2020-04-08 ENCOUNTER — Other Ambulatory Visit: Payer: Self-pay | Admitting: *Deleted

## 2020-04-08 ENCOUNTER — Other Ambulatory Visit (HOSPITAL_COMMUNITY)
Admission: RE | Admit: 2020-04-08 | Discharge: 2020-04-08 | Disposition: A | Discharge status: Home / Self Care | Payer: Medicare Other | Source: Ambulatory Visit | Attending: Urology | Admitting: Urology

## 2020-04-08 DIAGNOSIS — Z20822 Contact with and (suspected) exposure to covid-19: Secondary | ICD-10-CM | POA: Insufficient documentation

## 2020-04-08 DIAGNOSIS — I739 Peripheral vascular disease, unspecified: Secondary | ICD-10-CM

## 2020-04-08 DIAGNOSIS — Z01812 Encounter for preprocedural laboratory examination: Secondary | ICD-10-CM | POA: Diagnosis not present

## 2020-04-08 LAB — SARS CORONAVIRUS 2 (TAT 6-24 HRS): SARS Coronavirus 2: NEGATIVE

## 2020-04-08 NOTE — Progress Notes (Signed)
Chart reviewed by anesthesia , Konrad Felix PA, stated per notes pt stable and cleared by cardiology and meets surgery center guidelines.

## 2020-04-12 ENCOUNTER — Other Ambulatory Visit: Payer: Self-pay

## 2020-04-12 ENCOUNTER — Encounter (HOSPITAL_BASED_OUTPATIENT_CLINIC_OR_DEPARTMENT_OTHER): Payer: Self-pay | Admitting: Urology

## 2020-04-12 ENCOUNTER — Ambulatory Visit (HOSPITAL_BASED_OUTPATIENT_CLINIC_OR_DEPARTMENT_OTHER): Payer: Medicare Other | Admitting: Physician Assistant

## 2020-04-12 ENCOUNTER — Encounter (HOSPITAL_BASED_OUTPATIENT_CLINIC_OR_DEPARTMENT_OTHER)
Admission: RE | Disposition: A | Discharge status: Home / Self Care | Payer: Self-pay | Source: Home / Self Care | Attending: Urology

## 2020-04-12 ENCOUNTER — Ambulatory Visit (HOSPITAL_BASED_OUTPATIENT_CLINIC_OR_DEPARTMENT_OTHER)
Admission: RE | Admit: 2020-04-12 | Discharge: 2020-04-12 | Disposition: A | Discharge status: Home / Self Care | Payer: Medicare Other | Attending: Urology | Admitting: Urology

## 2020-04-12 DIAGNOSIS — F17210 Nicotine dependence, cigarettes, uncomplicated: Secondary | ICD-10-CM | POA: Insufficient documentation

## 2020-04-12 DIAGNOSIS — I10 Essential (primary) hypertension: Secondary | ICD-10-CM | POA: Insufficient documentation

## 2020-04-12 DIAGNOSIS — N3289 Other specified disorders of bladder: Secondary | ICD-10-CM | POA: Diagnosis not present

## 2020-04-12 DIAGNOSIS — D090 Carcinoma in situ of bladder: Secondary | ICD-10-CM | POA: Diagnosis not present

## 2020-04-12 DIAGNOSIS — Z79899 Other long term (current) drug therapy: Secondary | ICD-10-CM | POA: Insufficient documentation

## 2020-04-12 DIAGNOSIS — I739 Peripheral vascular disease, unspecified: Secondary | ICD-10-CM | POA: Diagnosis not present

## 2020-04-12 DIAGNOSIS — D494 Neoplasm of unspecified behavior of bladder: Secondary | ICD-10-CM | POA: Diagnosis not present

## 2020-04-12 DIAGNOSIS — E785 Hyperlipidemia, unspecified: Secondary | ICD-10-CM | POA: Diagnosis not present

## 2020-04-12 DIAGNOSIS — M1990 Unspecified osteoarthritis, unspecified site: Secondary | ICD-10-CM | POA: Insufficient documentation

## 2020-04-12 HISTORY — DX: Peripheral vascular disease, unspecified: I73.9

## 2020-04-12 HISTORY — DX: Neoplasm of unspecified behavior of bladder: D49.4

## 2020-04-12 HISTORY — PX: CYSTOSCOPY WITH BIOPSY: SHX5122

## 2020-04-12 HISTORY — DX: Dyspnea, unspecified: R06.00

## 2020-04-12 SURGERY — CYSTOSCOPY, WITH BIOPSY
Anesthesia: General | Site: Bladder

## 2020-04-12 MED ORDER — SODIUM CHLORIDE 0.9 % IR SOLN
Status: DC | PRN
  Administered 2020-04-12: 3000 mL via INTRAVESICAL

## 2020-04-12 MED ORDER — MEPERIDINE HCL 25 MG/ML IJ SOLN: 6.2500 mg | INTRAMUSCULAR | Status: DC | PRN

## 2020-04-12 MED ORDER — FENTANYL CITRATE (PF) 100 MCG/2ML IJ SOLN
INTRAMUSCULAR | Status: AC
  Filled 2020-04-12: qty 2

## 2020-04-12 MED ORDER — CEFAZOLIN SODIUM-DEXTROSE 2-4 GM/100ML-% IV SOLN
2.0000 g | INTRAVENOUS | Status: AC
  Administered 2020-04-12: 2 g via INTRAVENOUS

## 2020-04-12 MED ORDER — LACTATED RINGERS IV SOLN: INTRAVENOUS | Status: DC

## 2020-04-12 MED ORDER — GEMCITABINE CHEMO FOR BLADDER INSTILLATION 2000 MG
2000.0000 mg | Freq: Once | INTRAVENOUS | Status: AC
  Administered 2020-04-12: 2000 mg via INTRAVESICAL
  Filled 2020-04-12: qty 2000

## 2020-04-12 MED ORDER — ONDANSETRON HCL 4 MG/2ML IJ SOLN: 4.0000 mg | Freq: Once | INTRAMUSCULAR | Status: DC | PRN

## 2020-04-12 MED ORDER — LIDOCAINE 2% (20 MG/ML) 5 ML SYRINGE
INTRAMUSCULAR | Status: AC
  Filled 2020-04-12: qty 5

## 2020-04-12 MED ORDER — ONDANSETRON HCL 4 MG/2ML IJ SOLN
INTRAMUSCULAR | Status: DC | PRN
  Administered 2020-04-12: 4 mg via INTRAVENOUS

## 2020-04-12 MED ORDER — FENTANYL CITRATE (PF) 100 MCG/2ML IJ SOLN
25.0000 ug | INTRAMUSCULAR | Status: DC | PRN
  Administered 2020-04-12 (×2): 25 ug via INTRAVENOUS

## 2020-04-12 MED ORDER — FENTANYL CITRATE (PF) 100 MCG/2ML IJ SOLN
INTRAMUSCULAR | Status: DC | PRN
  Administered 2020-04-12: 50 ug via INTRAVENOUS
  Administered 2020-04-12 (×2): 25 ug via INTRAVENOUS

## 2020-04-12 MED ORDER — CEFAZOLIN SODIUM-DEXTROSE 2-4 GM/100ML-% IV SOLN
INTRAVENOUS | Status: AC
  Filled 2020-04-12: qty 100

## 2020-04-12 MED ORDER — PROPOFOL 10 MG/ML IV BOLUS
INTRAVENOUS | Status: DC | PRN
  Administered 2020-04-12: 50 mg via INTRAVENOUS
  Administered 2020-04-12: 100 mg via INTRAVENOUS
  Administered 2020-04-12: 20 mg via INTRAVENOUS
  Administered 2020-04-12: 30 mg via INTRAVENOUS

## 2020-04-12 MED ORDER — LIDOCAINE 2% (20 MG/ML) 5 ML SYRINGE
INTRAMUSCULAR | Status: DC | PRN
  Administered 2020-04-12: 60 mg via INTRAVENOUS

## 2020-04-12 MED ORDER — HYDROCODONE-ACETAMINOPHEN 5-325 MG PO TABS: 1.0000 | ORAL_TABLET | ORAL | 0 refills | Status: DC | PRN

## 2020-04-12 SURGICAL SUPPLY — 22 items
BAG DRAIN URO-CYSTO SKYTR STRL (DRAIN) ×2 IMPLANT
BAG URINE DRAIN 2000ML AR STRL (UROLOGICAL SUPPLIES) ×2 IMPLANT
CATH FOLEY 2WAY SLVR  5CC 18FR (CATHETERS) ×2
CATH FOLEY 2WAY SLVR 5CC 18FR (CATHETERS) ×1 IMPLANT
CLOTH BEACON ORANGE TIMEOUT ST (SAFETY) ×2 IMPLANT
ELECT REM PT RETURN 9FT ADLT (ELECTROSURGICAL) ×2
ELECTRODE REM PT RTRN 9FT ADLT (ELECTROSURGICAL) ×1 IMPLANT
GLOVE BIO SURGEON STRL SZ8 (GLOVE) ×2 IMPLANT
GOWN STRL REUS W/ TWL LRG LVL3 (GOWN DISPOSABLE) ×1 IMPLANT
GOWN STRL REUS W/ TWL XL LVL3 (GOWN DISPOSABLE) ×1 IMPLANT
GOWN STRL REUS W/TWL LRG LVL3 (GOWN DISPOSABLE) ×2
GOWN STRL REUS W/TWL XL LVL3 (GOWN DISPOSABLE) ×2
KIT TURNOVER CYSTO (KITS) ×2 IMPLANT
MANIFOLD NEPTUNE II (INSTRUMENTS) IMPLANT
NDL SAFETY ECLIPSE 18X1.5 (NEEDLE) IMPLANT
NEEDLE HYPO 18GX1.5 SHARP (NEEDLE)
NEEDLE HYPO 22GX1.5 SAFETY (NEEDLE) IMPLANT
NS IRRIG 500ML POUR BTL (IV SOLUTION) IMPLANT
PACK CYSTO (CUSTOM PROCEDURE TRAY) ×2 IMPLANT
SYR 20ML LL LF (SYRINGE) IMPLANT
TUBE CONNECTING 12X1/4 (SUCTIONS) ×2 IMPLANT
WATER STERILE IRR 3000ML UROMA (IV SOLUTION) ×2 IMPLANT

## 2020-04-12 NOTE — H&P (Signed)
CC/HPI: Cc: Bladder tumors  HPI:  08/09/2018  Referral by Dr. Matilde Sprang. Patient underwent a hematuria workup. CT scan revealed a bladder tumor. No upper tract abnormalities. This was performed with an without contrast with delayed images. The patient continues to have intermittent gross hematuria. Cystoscopy by the previously mentioned position was positive for multiple bladder tumors up to 2 cm in size. These apparently appeared to be superficial. Patient currently has no complaints today. She presents to discuss options for treatment.   08/29/2018:  Patient returns after undergoing a TURBT-large about 1 week ago. 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 Invasion was below muscularis mucosa. There is no muscularis propria present. The patient has been doing well. Denies gross hematuria. CT staging was negative for metastatic disease.   09/27/2018:  Patient is status post restage TURBT. This was about 1 week ago. Fortunately, pathology results were benign indicating no residual tumor. Detrusor muscle is present without involvement. Hematuria has resolved.   04/03/2019  Patient completed induction BCG. She presents for surveillance cystoscopy. She denies interval hematuria or dysuria.   11/04/2019  Patient presents for surveillance cystoscopy. She underwent a CT IVP which revealed some enhancement along the urethra. This was thought to represent inflammation versus diverticulum.   02/05/2020  Patient presents today for surveillance cystoscopy. She denies interval gross hematuria.     ALLERGIES: Doxycycline - Tachycardia    MEDICATIONS: Lisinopril  Prilosec  Calcium + D  Cilostazol  Klor-Con     GU PSH: Bladder Instill AntiCA Agent - 12/09/2018, 12/02/2018, 11/25/2018, 11/18/2018, 11/11/2018, 11/04/2018 Cystoscopy - 11/04/2019, 07/25/2019, 04/03/2019, 07/17/2018 Cystoscopy TURBT >5 cm - 08/16/2018 Cystoscopy TURBT 2-5  cm - 09/18/2018 Locm 300-399Mg /Ml Iodine,1Ml - 10/28/2019, 07/12/2018     NON-GU PSH: Breast Biopsy Tonsillectomy - 1960     GU PMH: Bladder Cancer overlapping sites - 08/29/2018 Bladder tumor/neoplasm - 08/09/2018 Gross hematuria - 07/11/2018 Nocturia - 07/11/2018 Urinary Frequency - 07/11/2018    NON-GU PMH: Arthritis Encounter for general adult medical examination without abnormal findings, Encounter for preventive health examination Hypercholesterolemia Hypertension    FAMILY HISTORY: Diabetes - Sister, Mother father deceased - Other mother deceased - Other   SOCIAL HISTORY: Marital Status: Divorced Current Smoking Status: Patient smokes. Has smoked since 06/16/1968. Smokes 1 pack per day.   Tobacco Use Assessment Completed: Used Tobacco in last 30 days? Drinks 2 drinks per day.  Drinks 1 caffeinated drink per day.    REVIEW OF SYSTEMS:    GU Review Female:   Patient denies frequent urination, hard to postpone urination, burning /pain with urination, get up at night to urinate, leakage of urine, stream starts and stops, trouble starting your stream, have to strain to urinate, and being pregnant.  Gastrointestinal (Upper):   Patient denies indigestion/ heartburn, vomiting, and nausea.  Gastrointestinal (Lower):   Patient denies diarrhea and constipation.  Constitutional:   Patient denies fever, night sweats, weight loss, and fatigue.  Skin:   Patient denies skin rash/ lesion and itching.  Eyes:   Patient denies blurred vision and double vision.  Ears/ Nose/ Throat:   Patient denies sore throat and sinus problems.  Hematologic/Lymphatic:   Patient denies swollen glands and easy bruising.  Cardiovascular:   Patient denies leg swelling and chest pains.  Respiratory:   Patient denies cough and shortness of breath.  Endocrine:   Patient denies excessive thirst.  Musculoskeletal:   Patient denies back pain and joint pain.  Neurological:   Patient denies headaches  and dizziness.   Psychologic:   Patient denies depression and anxiety.   Notes: leg and shoulder pain    VITAL SIGNS:      02/05/2020 11:22 AM  BP 169/92 mmHg  Heart Rate 123 /min  Temperature 97.5 F / 36.3 C   Complexity of Data:  Records Review:   Previous Patient Records   PROCEDURES:         Flexible Cystoscopy - 52000  Risks, benefits, and some of the potential complications of the procedure were discussed at length with the patient including infection, bleeding, voiding discomfort, urinary retention, fever, chills, sepsis, and others. All questions were answered. Informed consent was obtained. Antibiotic prophylaxis was given. Sterile technique and intraurethral analgesia were used.  Meatus:  Normal size. Normal location. Normal condition.  Urethra:  Normal urethra  Ureteral Orifices:  Normal location. Normal size. Normal shape.  Bladder:  No trabeculation. Small sub cm area of erythema towards the dome of the bladder. No stones.      The lower urinary tract was carefully examined. The procedure was well-tolerated and without complications. Antibiotic instructions were given. Instructions were given to call the office immediately for bloody urine, difficulty urinating, urinary retention, painful or frequent urination, fever, chills, nausea, vomiting or other illness. The patient stated that she understood these instructions and would comply with them.   ASSESSMENT:      ICD-10 Details  1 GU:   Bladder Cancer overlapping sites - C67.8 Chronic, Stable     PLAN:           Schedule Procedure: Unspecified Date - Cysto Bladder Ureth Biopsy - 38182          Document Letter(s):  Created for Patient: Clinical Summary         Notes:   Plan for cystoscopy with bladder biopsy and fulguration. Risks and benefits discussed.   Cc: Dr. Volanda Napoleon    Signed by Link Snuffer, III, M.D. on 02/05/20 at 11:41 AM (EDT

## 2020-04-12 NOTE — Op Note (Signed)
Operative Note  Preoperative diagnosis:  1.  Bladder lesion  Postoperative diagnosis: 1.  Bladder lesion  Procedure(s): 1.  Cystoscopy with bladder biopsy and fulguration 2.  Intravesical instillation of gemcitabine  Surgeon: Link Snuffer, MD  Assistants: None  Anesthesia: General  Complications: None immediate  EBL: Minimal  Specimens: 1.  Bladder biopsy  Drains/Catheters: 1.  18 French Foley catheter  Intraoperative findings: Normal urethra 2.  Approximately 2 cm area of slightly raised red mucosa that was biopsied completely and fulgurated.  No other lesions or masses.  Bilateral ureteral orifices were normal  Indication: 79 year old female with a history of bladder cancer underwent recent surveillance cystoscopy with the above findings and presents for the previously mentioned operation.  Description of procedure:  The patient was identified and consent was obtained.  The patient was taken to the operating room and placed in the supine position.  The patient was placed under general anesthesia.  Perioperative antibiotics were administered.  The patient was placed in dorsal lithotomy.  Patient was prepped and draped in a standard sterile fashion and a timeout was performed.  A 21 French rigid cystoscope was advanced into the urethra and into the bladder.  Complete cystoscopy was performed with the findings noted above.  Cold cup biopsy was used to biopsy the lesion x2 and this was passed off for permanent specimen.  I fulgurated the biopsy bed completely.  The entire lesion was biopsied and fulgurated.  I inspected the bladder mucosa again.  There was no active bleeding and no other lesions.  I withdrew the scope and placed an 61 French Foley catheter.  In the PACU, I instilled intravesical gemcitabine which remained in the bladder about 1 hour.  Plan: Follow-up in 1 week for pathology review

## 2020-04-12 NOTE — Anesthesia Postprocedure Evaluation (Signed)
Anesthesia Post Note  Patient: Tracy Davidson  Procedure(s) Performed: CYSTOSCOPY WITH BIOPSY  INSTILLATION OF GEMCITABINE (N/A Bladder)     Patient location during evaluation: PACU Anesthesia Type: General Level of consciousness: awake and alert Pain management: pain level controlled Vital Signs Assessment: post-procedure vital signs reviewed and stable Respiratory status: spontaneous breathing, nonlabored ventilation, respiratory function stable and patient connected to nasal cannula oxygen Cardiovascular status: blood pressure returned to baseline and stable Postop Assessment: no apparent nausea or vomiting Anesthetic complications: no   No complications documented.  Last Vitals:  Vitals:   04/12/20 1412 04/12/20 1415  BP: (!) 147/63 (!) 158/65  Pulse: 77 75  Resp: 12 14  Temp: 36.7 C   SpO2: 100% 100%    Last Pain:  Vitals:   04/12/20 1412  PainSc: 0-No pain                 [name] S

## 2020-04-12 NOTE — Anesthesia Preprocedure Evaluation (Addendum)
Anesthesia Evaluation    Airway Mallampati: II  TM Distance: >3 FB Neck ROM: Full    Dental no notable dental hx. (+) Teeth Intact   Pulmonary shortness of breath and with exertion, Current Smoker,    Pulmonary exam normal breath sounds clear to auscultation + decreased breath sounds      Cardiovascular hypertension, Pt. on medications + Peripheral Vascular Disease  Normal cardiovascular exam Rhythm:Regular Rate:Normal  EKG 01/29/20 NSR, LVH by voltage, RAD, anterior infarct, non specific ST-T depression likely strain pattern  Echo 02/02/20 1. Left ventricular ejection fraction, by estimation, is 60 to 65%. The left ventricle has normal function. The left ventricle has no regional wall motion abnormalities. There is moderate left ventricular hypertrophy.  Left ventricular diastolic parameters are indeterminate.  2. Right ventricular systolic function is normal. The right ventricular size is normal. There is normal pulmonary artery systolic pressure.  3. The mitral valve is normal in structure. No evidence of mitral valve regurgitation. No evidence of mitral stenosis.  4. The aortic valve is normal in structure. Aortic valve regurgitation is not visualized. No aortic stenosis is present.  5. The inferior vena cava is normal in size with greater than 50% respiratory variability, suggesting right atrial pressure of 3 mmHg.   Invasive Vascular Studies 03/03/20  Aortogram showed patent renal arteries bilaterally as well as a patent infrarenal aorta.  Bilateral common iliac arteries widely patent.  On the right she had a high-grade almost napkin ring stenosis in the external iliac artery with moderate disease proximal to the lesion over about a 40 mm segment.  Right lower extremity runoff showed a patent common femoral, profunda, SFA, popliteal and at least two-vessel runoff via the anterior tibial and peroneal.  Left lower extremity  runoff showed a patent common femoral, profunda, SFA, popliteal and apparent three-vessel runoff although little bit more sluggish which may have been from her sheath.  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  No evidence of residual stenosis.  Has preserved runoff in the right lower extremity.   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 Plavix therapy- last dose 04/06/20 Pletal therapy   Anesthesia Other Findings   Reproductive/Obstetrics                            Anesthesia Physical Anesthesia Plan  ASA: III  Anesthesia Plan: General   Post-op Pain Management:    Induction: Intravenous  PONV Risk Score and Plan: 4 or greater and Ondansetron and Treatment may vary due to age or medical condition  Airway Management Planned: LMA  Additional Equipment:   Intra-op Plan:   Post-operative Plan: Extubation in OR  Informed Consent: I have reviewed the patients History and Physical, chart, labs and discussed the procedure including the risks, benefits and alternatives for the proposed anesthesia with the patient or authorized representative who has indicated his/her understanding and acceptance.     Dental advisory given  Plan Discussed with: CRNA and Surgeon  Anesthesia Plan Comments:         Anesthesia  Quick Evaluation

## 2020-04-12 NOTE — Anesthesia Procedure Notes (Signed)
Procedure Name: LMA Insertion Date/Time: 04/12/2020 1:38 PM Performed by: Lollie Sails, CRNA Pre-anesthesia Checklist: Patient identified, Emergency Drugs available, Suction available, Patient being monitored and Timeout performed Patient Re-evaluated:Patient Re-evaluated prior to induction Oxygen Delivery Method: Circle system utilized Preoxygenation: Pre-oxygenation with 100% oxygen Induction Type: IV induction Ventilation: Mask ventilation without difficulty LMA: LMA inserted LMA Size: 4.0 Number of attempts: 2 Placement Confirmation: positive ETCO2 and breath sounds checked- equal and bilateral Tube secured with: Tape Dental Injury: Teeth and Oropharynx as per pre-operative assessment

## 2020-04-12 NOTE — Transfer of Care (Signed)
Immediate Anesthesia Transfer of Care Note  Patient: Tracy Davidson  Procedure(s) Performed: CYSTOSCOPY WITH BIOPSY  INSTILLATION OF GEMCITABINE (N/A Bladder)  Patient Location: PACU  Anesthesia Type:General  Level of Consciousness: awake, sedated and responds to stimulation  Airway & Oxygen Therapy: Patient Spontanous Breathing and Patient connected to nasal cannula oxygen  Post-op Assessment: Report given to RN and Post -op Vital signs reviewed and stable  Post vital signs: Reviewed and stable  Last Vitals:  Vitals Value Taken Time  BP 147/63 04/12/20 1412  Temp    Pulse 76 04/12/20 1413  Resp 11 04/12/20 1413  SpO2 100 % 04/12/20 1413  Vitals shown include unvalidated device data.  Last Pain: There were no vitals filed for this visit.       Complications: No complications documented.

## 2020-04-12 NOTE — Discharge Instructions (Addendum)
Transurethral Resection of Bladder Tumor (TURBT) or Bladder Biopsy   Definition:  Transurethral Resection of the Bladder Tumor is a surgical procedure used to diagnose and remove tumors within the bladder. TURBT is the most common treatment for early stage bladder cancer.  General instructions:     Your recent bladder surgery requires very little post hospital care but some definite precautions.  Despite the fact that no skin incisions were used, the area around the bladder incisions are raw and covered with scabs to promote healing and prevent bleeding. Certain precautions are needed to insure that the scabs are not disturbed over the next 2-4 weeks while the healing proceeds.  Because the raw surface inside your bladder and the irritating effects of urine you may expect frequency of urination and/or urgency (a stronger desire to urinate) and perhaps even getting up at night more often. This will usually resolve or improve slowly over the healing period. You may see some blood in your urine over the first 6 weeks. Do not be alarmed, even if the urine was clear for a while. Get off your feet and drink lots of fluids until clearing occurs. If you start to pass clots or don't improve call us.  Diet:  You may return to your normal diet immediately. Because of the raw surface of your bladder, alcohol, spicy foods, foods high in acid and drinks with caffeine may cause irritation or frequency and should be used in moderation. To keep your urine flowing freely and avoid constipation, drink plenty of fluids during the day (8-10 glasses). Tip: Avoid cranberry juice because it is very acidic.  Activity:  Your physical activity doesn't need to be restricted. However, if you are very active, you may see some blood in the urine. We suggest that you reduce your activity under the circumstances until the bleeding has stopped.  Bowels:  It is important to keep your bowels regular during the postoperative  period. Straining with bowel movements can cause bleeding. A bowel movement every other day is reasonable. Use a mild laxative if needed, such as milk of magnesia 2-3 tablespoons, or 2 Dulcolax tablets. Call if you continue to have problems. If you had been taking narcotics for pain, before, during or after your surgery, you may be constipated. Take a laxative if necessary.    Medication:  You should resume your pre-surgery medications unless told not to. In addition you may be given an antibiotic to prevent or treat infection. Antibiotics are not always necessary. All medication should be taken as prescribed until the bottles are finished unless you are having an unusual reaction to one of the drugs.   Post Anesthesia Home Care Instructions  Activity: Get plenty of rest for the remainder of the day. A responsible individual must stay with you for 24 hours following the procedure.  For the next 24 hours, DO NOT: -Drive a car -Operate machinery -Drink alcoholic beverages -Take any medication unless instructed by your physician -Make any legal decisions or sign important papers.  Meals: Start with liquid foods such as gelatin or soup. Progress to regular foods as tolerated. Avoid greasy, spicy, heavy foods. If nausea and/or vomiting occur, drink only clear liquids until the nausea and/or vomiting subsides. Call your physician if vomiting continues.  Special Instructions/Symptoms: Your throat may feel dry or sore from the anesthesia or the breathing tube placed in your throat during surgery. If this causes discomfort, gargle with warm salt water. The discomfort should disappear within 24 hours.       

## 2020-04-12 NOTE — Interval H&P Note (Signed)
History and Physical Interval Note:  04/12/2020 1:27 PM  Tracy Davidson  has presented today for surgery, with the diagnosis of BLADDER TUMOR.  The various methods of treatment have been discussed with the patient and family. After consideration of risks, benefits and other options for treatment, the patient has consented to  Procedure(s): CYSTOSCOPY WITH BIOPSY  INSTILLATION OF GEMCITABINE (N/A) as a surgical intervention.  The patient's history has been reviewed, patient examined, no change in status, stable for surgery.  I have reviewed the patient's chart and labs.  Questions were answered to the patient's satisfaction.     Marton Redwood, III

## 2020-04-13 ENCOUNTER — Encounter (HOSPITAL_BASED_OUTPATIENT_CLINIC_OR_DEPARTMENT_OTHER): Payer: Self-pay | Admitting: Urology

## 2020-04-13 LAB — POCT I-STAT, CHEM 8
BUN: 16 mg/dL (ref 8–23)
Calcium, Ion: 1.33 mmol/L (ref 1.15–1.40)
Chloride: 99 mmol/L (ref 98–111)
Creatinine, Ser: 0.7 mg/dL (ref 0.44–1.00)
Glucose, Bld: 88 mg/dL (ref 70–99)
HCT: 48 % — ABNORMAL HIGH (ref 36.0–46.0)
Hemoglobin: 16.3 g/dL — ABNORMAL HIGH (ref 12.0–15.0)
Potassium: 3.6 mmol/L (ref 3.5–5.1)
Sodium: 140 mmol/L (ref 135–145)
TCO2: 28 mmol/L (ref 22–32)

## 2020-04-13 LAB — SURGICAL PATHOLOGY

## 2020-04-21 DIAGNOSIS — C678 Malignant neoplasm of overlapping sites of bladder: Secondary | ICD-10-CM | POA: Diagnosis not present

## 2020-04-21 DIAGNOSIS — D090 Carcinoma in situ of bladder: Secondary | ICD-10-CM | POA: Diagnosis not present

## 2020-05-03 DIAGNOSIS — C678 Malignant neoplasm of overlapping sites of bladder: Secondary | ICD-10-CM | POA: Diagnosis not present

## 2020-05-03 DIAGNOSIS — Z5111 Encounter for antineoplastic chemotherapy: Secondary | ICD-10-CM | POA: Diagnosis not present

## 2020-05-10 DIAGNOSIS — Z5111 Encounter for antineoplastic chemotherapy: Secondary | ICD-10-CM | POA: Diagnosis not present

## 2020-05-10 DIAGNOSIS — C678 Malignant neoplasm of overlapping sites of bladder: Secondary | ICD-10-CM | POA: Diagnosis not present

## 2020-05-12 DIAGNOSIS — H25813 Combined forms of age-related cataract, bilateral: Secondary | ICD-10-CM | POA: Diagnosis not present

## 2020-05-12 DIAGNOSIS — H524 Presbyopia: Secondary | ICD-10-CM | POA: Diagnosis not present

## 2020-05-12 DIAGNOSIS — H52203 Unspecified astigmatism, bilateral: Secondary | ICD-10-CM | POA: Diagnosis not present

## 2020-05-15 ENCOUNTER — Other Ambulatory Visit: Payer: Self-pay | Admitting: Family Medicine

## 2020-05-15 DIAGNOSIS — K219 Gastro-esophageal reflux disease without esophagitis: Secondary | ICD-10-CM

## 2020-05-17 DIAGNOSIS — Z5111 Encounter for antineoplastic chemotherapy: Secondary | ICD-10-CM | POA: Diagnosis not present

## 2020-05-17 DIAGNOSIS — C678 Malignant neoplasm of overlapping sites of bladder: Secondary | ICD-10-CM | POA: Diagnosis not present

## 2020-05-24 DIAGNOSIS — Z5111 Encounter for antineoplastic chemotherapy: Secondary | ICD-10-CM | POA: Diagnosis not present

## 2020-05-24 DIAGNOSIS — C678 Malignant neoplasm of overlapping sites of bladder: Secondary | ICD-10-CM | POA: Diagnosis not present

## 2020-05-31 DIAGNOSIS — Z5111 Encounter for antineoplastic chemotherapy: Secondary | ICD-10-CM | POA: Diagnosis not present

## 2020-05-31 DIAGNOSIS — C678 Malignant neoplasm of overlapping sites of bladder: Secondary | ICD-10-CM | POA: Diagnosis not present

## 2020-06-02 DIAGNOSIS — H25011 Cortical age-related cataract, right eye: Secondary | ICD-10-CM | POA: Diagnosis not present

## 2020-06-02 DIAGNOSIS — H25812 Combined forms of age-related cataract, left eye: Secondary | ICD-10-CM | POA: Diagnosis not present

## 2020-06-02 DIAGNOSIS — H2511 Age-related nuclear cataract, right eye: Secondary | ICD-10-CM | POA: Diagnosis not present

## 2020-06-07 DIAGNOSIS — C678 Malignant neoplasm of overlapping sites of bladder: Secondary | ICD-10-CM | POA: Diagnosis not present

## 2020-06-07 DIAGNOSIS — Z5111 Encounter for antineoplastic chemotherapy: Secondary | ICD-10-CM | POA: Diagnosis not present

## 2020-06-09 DIAGNOSIS — H25012 Cortical age-related cataract, left eye: Secondary | ICD-10-CM | POA: Diagnosis not present

## 2020-06-09 DIAGNOSIS — H2512 Age-related nuclear cataract, left eye: Secondary | ICD-10-CM | POA: Diagnosis not present

## 2020-06-30 ENCOUNTER — Other Ambulatory Visit: Payer: Self-pay | Admitting: Family Medicine

## 2020-06-30 DIAGNOSIS — I10 Essential (primary) hypertension: Secondary | ICD-10-CM

## 2020-07-09 ENCOUNTER — Telehealth: Payer: Self-pay | Admitting: Family Medicine

## 2020-07-09 NOTE — Telephone Encounter (Signed)
Pt wants a call back to talk about the COVID booster shot

## 2020-07-16 DIAGNOSIS — Z23 Encounter for immunization: Secondary | ICD-10-CM | POA: Diagnosis not present

## 2020-07-21 NOTE — Telephone Encounter (Signed)
Spoke with pt advised to get he COVID Booster shot 8 months after her 2nd COVID shot.

## 2020-07-21 NOTE — Telephone Encounter (Signed)
Left a message for pt to contact our office for further information

## 2020-07-22 DIAGNOSIS — Z8551 Personal history of malignant neoplasm of bladder: Secondary | ICD-10-CM | POA: Diagnosis not present

## 2020-09-26 ENCOUNTER — Other Ambulatory Visit: Payer: Self-pay | Admitting: Family Medicine

## 2020-09-26 DIAGNOSIS — I10 Essential (primary) hypertension: Secondary | ICD-10-CM

## 2020-10-04 ENCOUNTER — Other Ambulatory Visit: Payer: Self-pay | Admitting: Family Medicine

## 2020-10-04 ENCOUNTER — Telehealth: Payer: Self-pay | Admitting: Family Medicine

## 2020-10-04 DIAGNOSIS — Z1231 Encounter for screening mammogram for malignant neoplasm of breast: Secondary | ICD-10-CM

## 2020-10-04 DIAGNOSIS — Z961 Presence of intraocular lens: Secondary | ICD-10-CM | POA: Diagnosis not present

## 2020-10-04 NOTE — Telephone Encounter (Signed)
Left message for patient to call back and schedule Medicare Annual Wellness Visit (AWV) either virtually or in office.   Last AWV 10/06/19 please schedule at anytime with [facility] Nurse Health Advisor 1 or 2   This should be a 45 minute visit.

## 2020-10-26 ENCOUNTER — Ambulatory Visit (INDEPENDENT_AMBULATORY_CARE_PROVIDER_SITE_OTHER): Payer: Medicare Other

## 2020-10-26 DIAGNOSIS — Z0000 Encounter for general adult medical examination without abnormal findings: Secondary | ICD-10-CM | POA: Diagnosis not present

## 2020-10-26 DIAGNOSIS — F17200 Nicotine dependence, unspecified, uncomplicated: Secondary | ICD-10-CM | POA: Diagnosis not present

## 2020-10-26 NOTE — Patient Instructions (Signed)
[name] , Thank you for taking time to come for your Medicare Wellness Visit. I appreciate your ongoing commitment to your health goals. Please review the following plan we discussed and let me know if I can assist you in the future.   Screening recommendations/referrals: Colonoscopy: No longer required  Mammogram: Up to date, please keep upcoming appointment on 11/15/2020 Bone Density: Up to date, next due 05/06/2021 Recommended yearly ophthalmology/optometry visit for glaucoma screening and checkup Recommended yearly dental visit for hygiene and checkup  Vaccinations: Influenza vaccine: Up to date. Next due fall 2022  Pneumococcal vaccine: Completed series  Tdap vaccine: Up to date, next due 03/13/2021 Shingles vaccine: Currently due for Shingrix, if you wish to receive we recommend that you do so at your local pharmacy as it is less expensive     Advanced directives: Advance directive discussed with you today. Even though you declined this today please call our office should you change your mind and we can give you the proper paperwork for you to fill out.   Conditions/risks identified: We have placed this order for you to get a lung cancer screening. Someone from their office will be contacting you shortly   Next appointment: 11/03/2020 @ 11:00 am with Dr. Volanda Napoleon   Preventive Care 81 Years and Older, Female Preventive care refers to lifestyle choices and visits with your health care provider that can promote health and wellness. What does preventive care include?  A yearly physical exam. This is also called an annual well check.  Dental exams once or twice a year.  Routine eye exams. Ask your health care provider how often you should have your eyes checked.  Personal lifestyle choices, including:  Daily care of your teeth and gums.  Regular physical activity.  Eating a healthy diet.  Avoiding tobacco and drug use.  Limiting alcohol use.  Practicing safe  sex.  Taking low-dose aspirin every day.  Taking vitamin and mineral supplements as recommended by your health care provider. What happens during an annual well check? The services and screenings done by your health care provider during your annual well check will depend on your age, overall health, lifestyle risk factors, and family history of disease. Counseling  Your health care provider may ask you questions about your:  Alcohol use.  Tobacco use.  Drug use.  Emotional well-being.  Home and relationship well-being.  Sexual activity.  Eating habits.  History of falls.  Memory and ability to understand (cognition).  Work and work Statistician.  Reproductive health. Screening  You may have the following tests or measurements:  Height, weight, and BMI.  Blood pressure.  Lipid and cholesterol levels. These may be checked every 5 years, or more frequently if you are over 29 years old.  Skin check.  Lung cancer screening. You may have this screening every year starting at age 2 if you have a 30-pack-year history of smoking and currently smoke or have quit within the past 15 years.  Fecal occult blood test (FOBT) of the stool. You may have this test every year starting at age 52.  Flexible sigmoidoscopy or colonoscopy. You may have a sigmoidoscopy every 5 years or a colonoscopy every 10 years starting at age 55.  Hepatitis C blood test.  Hepatitis B blood test.  Sexually transmitted disease (STD) testing.  Diabetes screening. This is done by checking your blood sugar (glucose) after you have not eaten for a while (fasting). You may have this done every 1-3 years.  Bone  density scan. This is done to screen for osteoporosis. You may have this done starting at age 24.  Mammogram. This may be done every 1-2 years. Talk to your health care provider about how often you should have regular mammograms. Talk with your health care provider about your test results,  treatment options, and if necessary, the need for more tests. Vaccines  Your health care provider may recommend certain vaccines, such as:  Influenza vaccine. This is recommended every year.  Tetanus, diphtheria, and acellular pertussis (Tdap, Td) vaccine. You may need a Td booster every 10 years.  Zoster vaccine. You may need this after age 46.  Pneumococcal 13-valent conjugate (PCV13) vaccine. One dose is recommended after age 81.  Pneumococcal polysaccharide (PPSV23) vaccine. One dose is recommended after age 81. Talk to your health care provider about which screenings and vaccines you need and how often you need them. This information is not intended to replace advice given to you by your health care provider. Make sure you discuss any questions you have with your health care provider. Document Released: 10/29/2015 Document Revised: 06/21/2016 Document Reviewed: 08/03/2015 Elsevier Interactive Patient Education  2017 [place] Prevention in the Home Falls can cause injuries. They can happen to people of all ages. There are many things you can do to make your home safe and to help prevent falls. What can I do on the outside of my home?  Regularly fix the edges of walkways and driveways and fix any cracks.  Remove anything that might make you trip as you walk through a door, such as a raised step or threshold.  Trim any bushes or trees on the path to your home.  Use bright outdoor lighting.  Clear any walking paths of anything that might make someone trip, such as rocks or tools.  Regularly check to see if handrails are loose or broken. Make sure that both sides of any steps have handrails.  Any raised decks and porches should have guardrails on the edges.  Have any leaves, snow, or ice cleared regularly.  Use sand or salt on walking paths during winter.  Clean up any spills in your garage right away. This includes oil or grease spills. What can I do in the  bathroom?  Use night lights.  Install grab bars by the toilet and in the tub and shower. Do not use towel bars as grab bars.  Use non-skid mats or decals in the tub or shower.  If you need to sit down in the shower, use a plastic, non-slip stool.  Keep the floor dry. Clean up any water that spills on the floor as soon as it happens.  Remove soap buildup in the tub or shower regularly.  Attach bath mats securely with double-sided non-slip rug tape.  Do not have throw rugs and other things on the floor that can make you trip. What can I do in the bedroom?  Use night lights.  Make sure that you have a light by your bed that is easy to reach.  Do not use any sheets or blankets that are too big for your bed. They should not hang down onto the floor.  Have a firm chair that has side arms. You can use this for support while you get dressed.  Do not have throw rugs and other things on the floor that can make you trip. What can I do in the kitchen?  Clean up any spills right away.  Avoid walking on  wet floors.  Keep items that you use a lot in easy-to-reach places.  If you need to reach something above you, use a strong step stool that has a grab bar.  Keep electrical cords out of the way.  Do not use floor polish or wax that makes floors slippery. If you must use wax, use non-skid floor wax.  Do not have throw rugs and other things on the floor that can make you trip. What can I do with my stairs?  Do not leave any items on the stairs.  Make sure that there are handrails on both sides of the stairs and use them. Fix handrails that are broken or loose. Make sure that handrails are as long as the stairways.  Check any carpeting to make sure that it is firmly attached to the stairs. Fix any carpet that is loose or worn.  Avoid having throw rugs at the top or bottom of the stairs. If you do have throw rugs, attach them to the floor with carpet tape.  Make sure that you have a  light switch at the top of the stairs and the bottom of the stairs. If you do not have them, ask someone to add them for you. What else can I do to help prevent falls?  Wear shoes that:  Do not have high heels.  Have rubber bottoms.  Are comfortable and fit you well.  Are closed at the toe. Do not wear sandals.  If you use a stepladder:  Make sure that it is fully opened. Do not climb a closed stepladder.  Make sure that both sides of the stepladder are locked into place.  Ask someone to hold it for you, if possible.  Clearly mark and make sure that you can see:  Any grab bars or handrails.  First and last steps.  Where the edge of each step is.  Use tools that help you move around (mobility aids) if they are needed. These include:  Canes.  Walkers.  Scooters.  Crutches.  Turn on the lights when you go into a dark area. Replace any light bulbs as soon as they burn out.  Set up your furniture so you have a clear path. Avoid moving your furniture around.  If any of your floors are uneven, fix them.  If there are any pets around you, be aware of where they are.  Review your medicines with your doctor. Some medicines can make you feel dizzy. This can increase your chance of falling. Ask your doctor what other things that you can do to help prevent falls. This information is not intended to replace advice given to you by your health care provider. Make sure you discuss any questions you have with your health care provider. Document Released: 07/29/2009 Document Revised: 03/09/2016 Document Reviewed: 11/06/2014 Elsevier Interactive Patient Education  2017 [employer].

## 2020-10-26 NOTE — Progress Notes (Signed)
Subjective:   Tracy Davidson is a 80 y.o. female who presents for an Initial Medicare Annual Wellness Visit.   I connected with Milana Obey today by telephone and verified that I am speaking with the correct person using two identifiers. Location patient: home Location provider: work Persons participating in the virtual visit: patient, provider.   I discussed the limitations, risks, security and privacy concerns of performing an evaluation and management service by telephone and the availability of in person appointments. I also discussed with the patient that there may be a patient responsible charge related to this service. The patient expressed understanding and verbally consented to this telephonic visit.    Interactive audio and video telecommunications were attempted between this provider and patient, however failed, due to patient having technical difficulties OR patient did not have access to video capability.  We continued and completed visit with audio only.     Review of Systems    N/A  Cardiac Risk Factors include: advanced age (>34men, >19 women);hypertension;dyslipidemia;smoking/ tobacco exposure     Objective:    Today's Vitals   10/26/20 1403  PainSc: 6    There is no height or weight on file to calculate BMI.  Advanced Directives 10/26/2020 04/12/2020 03/03/2020 02/03/2020 10/06/2019 09/18/2018 08/16/2018  Does Patient Have a Medical Advance Directive? No No No No No No No  Would patient like information on creating a medical advance directive? No - Patient declined No - Patient declined No - Patient declined No - Patient declined No - Patient declined Yes (MAU/Ambulatory/Procedural Areas - Information given) Yes (MAU/Ambulatory/Procedural Areas - Information given)    Current Medications (verified) Outpatient Encounter Medications as of 10/26/2020  Medication Sig  . aspirin EC 81 MG tablet Take 1 tablet (81 mg total) by mouth daily.  Marland Kitchen atorvastatin (LIPITOR)  10 MG tablet Take 1 tablet (10 mg total) by mouth daily.  . calcium carbonate (OSCAL) 1500 (600 Ca) MG TABS tablet Take 1,200 mg by mouth 2 (two) times daily with a meal.   . Cholecalciferol (VITAMIN D) 125 MCG (5000 UT) CAPS Take 5,000 Units by mouth daily.   . cilostazol (PLETAL) 100 MG tablet Take 1 tablet (100 mg total) by mouth 2 (two) times daily before a meal.  . KLOR-CON M20 20 MEQ tablet TAKE 1 TABLET BY MOUTH EVERY DAY  . lisinopril-hydrochlorothiazide (ZESTORETIC) 20-25 MG tablet TAKE 1 TABLET BY MOUTH EVERY DAY  . omeprazole (PRILOSEC) 20 MG capsule TAKE 1 CAPSULE BY MOUTH EVERY DAY  . clopidogrel (PLAVIX) 75 MG tablet Take 1 tablet (75 mg total) by mouth daily. (Patient not taking: Reported on 10/26/2020)  . HYDROcodone-acetaminophen (NORCO) 5-325 MG tablet Take 1 tablet by mouth every 4 (four) hours as needed for moderate pain. (Patient not taking: Reported on 10/26/2020)   No facility-administered encounter medications on file as of 10/26/2020.    Allergies (verified) Doxycycline   History: Past Medical History:  Diagnosis Date  . ALLERGIC RHINITIS 09/20/2009  . Allergy   . Arthritis    LEGS and left arm  . Bladder tumor   . Cataract    both eyes, right worse thsn left  . COLONIC POLYPS, HX OF 06/06/2007  . DYSLIPIDEMIA 09/20/2009  . Dyspnea    sob with activity  . HYPERTENSION 06/06/2007  . Knee pain    stiff and pain on side  . LIVER FUNCTION TESTS, ABNORMAL, HX OF    feb or march 2019  . Peripheral vascular disease ([place])   .  Shingles 11/2019   left arm and shoulder   Past Surgical History:  Procedure Laterality Date  . ABDOMINAL AORTOGRAM W/LOWER EXTREMITY Bilateral 03/03/2020   Procedure: ABDOMINAL AORTOGRAM W/LOWER EXTREMITY;  Surgeon: Marty Heck, MD;  Location: [facility];  Service: Cardiovascular;  Laterality: Bilateral;  . BREAST EXCISIONAL BIOPSY Left 1975  . COLONOSCOPY  ?2015  . CYSTOSCOPY WITH BIOPSY N/A 04/12/2020   Procedure:  CYSTOSCOPY WITH BIOPSY  INSTILLATION OF GEMCITABINE;  Surgeon: Lucas Mallow, MD;  Location[facility];  Service: Urology;  Laterality: N/A;  . DILATION AND CURETTAGE OF UTERUS  yrs ago  . PERIPHERAL VASCULAR INTERVENTION Right 03/03/2020   Procedure: PERIPHERAL VASCULAR INTERVENTION;  Surgeon: Marty Heck, MD;  Location: [facility];  Service: Cardiovascular;  Laterality: Right;  . TONSILLECTOMY  1963  . TOTAL ABDOMINAL HYSTERECTOMY  yrs ago   complete  . TRANSURETHRAL RESECTION OF BLADDER TUMOR N/A 09/18/2018   Procedure: RESTAGE TRANSURETHRAL RESECTION OF BLADDER TUMOR (TURBT);  Surgeon: Lucas Mallow, MD;  Location[facility];  Service: Urology;  Laterality: N/A;  . TRANSURETHRAL RESECTION OF BLADDER TUMOR WITH MITOMYCIN-C N/A 08/16/2018   Procedure: TRANSURETHRAL RESECTION OF BLADDER TUMOR;  Surgeon: Lucas Mallow, MD;  Location: [facility];  Service: Urology;  Laterality: N/A;   Family History  Problem Relation Age of Onset  . Colon cancer Neg Hx   . Colon polyps Neg Hx   . Esophageal cancer Neg Hx   . Rectal cancer Neg Hx   . Stomach cancer Neg Hx   . Pancreatic cancer Neg Hx   . Breast cancer Neg Hx    Social History   Socioeconomic History  . Marital status: Single    Spouse name: Not on file  . Number of children: 0  . Years of education: 6 years college  . Highest education level: Master's degree (e.g., MA, MS, MEng, MEd, MSW, MBA)  Occupational History  . Occupation: retired  Tobacco Use  . Smoking status: Current Every Day Smoker    Packs/day: 1.00    Years: 50.00    Pack years: 50.00    Types: Cigarettes  . Smokeless tobacco: Never Used  Vaping Use  . Vaping Use: Never used  Substance and Sexual Activity  . Alcohol use: Not Currently    Alcohol/week: 2.0 standard drinks    Types: 2 Shots of liquor per week    Comment: none last month as of 04-07-2020  . Drug use: No  . Sexual  activity: Not on file  Other Topics Concern  . Not on file  Social History Narrative  . Not on file   Social Determinants of Health   Financial Resource Strain: Low Risk   . Difficulty of Paying Living Expenses: Not hard at all  Food Insecurity: No Food Insecurity  . Worried About Charity fundraiser in the Last Year: Never true  . Ran Out of Food in the Last Year: Never true  Transportation Needs: No Transportation Needs  . Lack of Transportation (Medical): No  . Lack of Transportation (Non-Medical): No  Physical Activity: Inactive  . Days of Exercise per Week: 0 days  . Minutes of Exercise per Session: 0 min  Stress: No Stress Concern Present  . Feeling of Stress : Not at all  Social Connections: Moderately Integrated  . Frequency of Communication with Friends and Family: More than three times a week  . Frequency  of Social Gatherings with Friends and Family: Twice a week  . Attends Religious Services: More than 4 times per year  . Active Member of Clubs or Organizations: Yes  . Attends Archivist Meetings: More than 4 times per year  . Marital Status: Divorced    Tobacco Counseling Ready to quit: Not Answered Counseling given: Not Answered   Clinical Intake:  Pre-visit preparation completed: Yes  Pain : 0-10 Pain Score: 6  Pain Type: Chronic pain Pain Location: Leg Pain Orientation: Right Pain Descriptors / Indicators:  (Stiffness) Pain Onset: More than a month ago Pain Frequency: Constant     Nutritional Risks: None Diabetes: No  How often do you need to have someone help you when you read instructions, pamphlets, or other written materials from your doctor or pharmacy?: 2 - Rarely What is the last grade level you completed in school?: Masters Degree  Diabetic? No   Interpreter Needed?: No  Information entered by :: [place] of Daily Living In your present state of health, do you have any difficulty performing the following  activities: 10/26/2020 04/12/2020  Hearing? N N  Vision? N N  Difficulty concentrating or making decisions? N N  Walking or climbing stairs? Y Y  Comment Has pain in her legs, states has to climb stairs slowly -  Dressing or bathing? N N  Doing errands, shopping? N -  Preparing Food and eating ? N -  Using the Toilet? N -  In the past six months, have you accidently leaked urine? Y -  Comment some urine leakage from holding to long -  Do you have problems with loss of bowel control? N -  Managing your Medications? N -  Managing your Finances? N -  Housekeeping or managing your Housekeeping? N -  Some recent data might be hidden    Patient Care Team: Billie Ruddy, MD as PCP - General (Family Medicine) Richardo Priest, MD as PCP - Cardiology (Cardiology)  Indicate any recent Medical Services you may have received from other than [facility] providers in the past year (date may be approximate).     Assessment:   This is a routine wellness examination for [place].  Hearing/Vision screen  Hearing Screening   125Hz  250Hz  500Hz  1000Hz  2000Hz  3000Hz  4000Hz  6000Hz  8000Hz   Right ear:           Left ear:           Vision Screening Comments: Patient states gets examined once per year. Had cataracts removed in August 2021  Dietary issues and exercise activities discussed: Current Exercise Habits: The patient does not participate in regular exercise at present, Exercise limited by: orthopedic condition(s)  Goals    . Exercise 3x per week (30 min per time)    . Have 3 meals a day     Try to eat 3 meals per day; even if they are small.     . Patient Stated     I would like to stop hurting!       Depression Screen PHQ 2/9 Scores 10/26/2020 01/29/2020 10/06/2019 12/08/2015 10/30/2013  PHQ - 2 Score 0 0 0 0 0  PHQ- 9 Score 0 - - - -    Fall Risk Fall Risk  10/26/2020 01/29/2020 10/06/2019 12/08/2015 10/30/2013  Falls in the past year? 0 0 - No Yes  Number falls in past yr: 0 - 1 - 2 or more   Injury with Fall? 0 - 0 - -  Risk for fall due to : Impaired balance/gait;Impaired mobility - History of fall(s);Medication side effect - Impaired balance/gait  Follow up Falls evaluation completed;Falls prevention discussed - Falls evaluation completed;Education provided;Falls prevention discussed - -    FALL RISK PREVENTION PERTAINING TO THE HOME:  Any stairs in or around the home? No  If so, are there any without handrails? No  Home free of loose throw rugs in walkways, pet beds, electrical cords, etc? Yes  Adequate lighting in your home to reduce risk of falls? Yes   ASSISTIVE DEVICES UTILIZED TO PREVENT FALLS:  Life alert? No  Use of a cane, walker or w/c? Yes  Grab bars in the bathroom? No  Shower chair or bench in shower? Yes  Elevated toilet seat or a handicapped toilet? No     Cognitive Function:     6CIT Screen 10/06/2019  What Year? 0 points  What month? 0 points  What time? 0 points  Count back from 20 0 points  Months in reverse 0 points  Repeat phrase 0 points  Total Score 0    Immunizations Immunization History  Administered Date(s) Administered  . Fluad Quad(high Dose 65+) 07/16/2020  . Influenza Split 08/24/2013  . Influenza Whole 08/16/1997  . Influenza, High Dose Seasonal PF 10/24/2017, 08/21/2018, 07/29/2019, 07/29/2019, 07/16/2020  . Influenza-Unspecified 09/06/2014, 08/07/2015, 08/21/2018  . Pneumococcal Conjugate-13 10/30/2013  . Pneumococcal Polysaccharide-23 10/16/2008  . Tdap 03/14/2011    TDAP status: Up to date  Flu Vaccine status: Up to date  Pneumococcal vaccine status: Up to date  Covid-19 vaccine status: Completed vaccines  Qualifies for Shingles Vaccine? Yes   Zostavax completed No   Shingrix Completed?: No.    Education has been provided regarding the importance of this vaccine. Patient has been advised to call insurance company to determine out of pocket expense if they have not yet received this vaccine. Advised may also  receive vaccine at local pharmacy or Health Dept. Verbalized acceptance and understanding.  Screening Tests Health Maintenance  Topic Date Due  . COVID-19 Vaccine (1) Never done  . MAMMOGRAM  11/12/2020  . TETANUS/TDAP  03/13/2021  . INFLUENZA VACCINE  Completed  . DEXA SCAN  Completed  . PNA vac Low Risk Adult  Completed    Health Maintenance  Health Maintenance Due  Topic Date Due  . COVID-19 Vaccine (1) Never done    Colorectal cancer screening: No longer required.   Mammogram status: Ordered 10/04/2020. Pt provided with contact info and advised to call to schedule appt.   Bone Density status: Completed 05/07/2019. Results reflect: Bone density results: OSTEOPOROSIS. Repeat every 2 years.  Lung Cancer Screening: (Low Dose CT Chest recommended if Age 64-80 years, 30 pack-year currently smoking OR have quit w/in 15years.) does qualify.   Lung Cancer Screening Referral: yes  Additional Screening:  Hepatitis C Screening: does not qualify;   Vision Screening: Recommended annual ophthalmology exams for early detection of glaucoma and other disorders of the eye. Is the patient up to date with their annual eye exam?  Yes  Who is the provider or what is the name of the office in which the patient attends annual eye exams? Dr. Gershon Crane  If pt is not established with a provider, would they like to be referred to a provider to establish care? No .   Dental Screening: Recommended annual dental exams for proper oral hygiene  Community Resource Referral / Chronic Care Management: CRR required this visit?  No   CCM required this  visit?  No      Plan:     I have personally reviewed and noted the following in the patient's chart:   . Medical and social history . Use of alcohol, tobacco or illicit drugs  . Current medications and supplements . Functional ability and status . Nutritional status . Physical activity . Advanced directives . List of other  physicians . Hospitalizations, surgeries, and ER visits in previous 12 months . Vitals . Screenings to include cognitive, depression, and falls . Referrals and appointments  In addition, I have reviewed and discussed with patient certain preventive protocols, quality metrics, and best practice recommendations. A written personalized care plan for preventive services as well as general preventive health recommendations were provided to patient.     Ofilia Neas, LPN   [id number]   Nurse Notes: None

## 2020-10-28 ENCOUNTER — Ambulatory Visit: Payer: Medicare Other | Admitting: Family Medicine

## 2020-11-03 ENCOUNTER — Other Ambulatory Visit: Payer: Self-pay

## 2020-11-03 ENCOUNTER — Ambulatory Visit (INDEPENDENT_AMBULATORY_CARE_PROVIDER_SITE_OTHER): Payer: Medicare Other

## 2020-11-03 ENCOUNTER — Ambulatory Visit (INDEPENDENT_AMBULATORY_CARE_PROVIDER_SITE_OTHER): Payer: Medicare Other | Admitting: Family Medicine

## 2020-11-03 ENCOUNTER — Encounter: Payer: Self-pay | Admitting: Family Medicine

## 2020-11-03 VITALS — BP 142/76 | HR 102 | Temp 98.5°F | Wt 115.8 lb

## 2020-11-03 DIAGNOSIS — M1611 Unilateral primary osteoarthritis, right hip: Secondary | ICD-10-CM | POA: Diagnosis not present

## 2020-11-03 DIAGNOSIS — W19XXXA Unspecified fall, initial encounter: Secondary | ICD-10-CM | POA: Diagnosis not present

## 2020-11-03 DIAGNOSIS — M1711 Unilateral primary osteoarthritis, right knee: Secondary | ICD-10-CM

## 2020-11-03 DIAGNOSIS — M79651 Pain in right thigh: Secondary | ICD-10-CM | POA: Diagnosis not present

## 2020-11-03 DIAGNOSIS — M25551 Pain in right hip: Secondary | ICD-10-CM | POA: Diagnosis not present

## 2020-11-03 DIAGNOSIS — M25561 Pain in right knee: Secondary | ICD-10-CM | POA: Diagnosis not present

## 2020-11-03 MED ORDER — CAPSAICIN 0.075 % EX CREA: TOPICAL_CREAM | CUTANEOUS | 0 refills | Status: DC

## 2020-11-03 NOTE — Patient Instructions (Addendum)
You can use arthritis strength Tylenol as needed for your leg and knee pain.  He can also use topical medications such as Voltaren gel, Biofreeze, capsaicin cream.  A prescription was sent to your pharmacy for capsaicin cream, however it may be found over-the-counter at your local drugstore. Osteoarthritis  Osteoarthritis is a type of arthritis. It refers to joint pain or joint disease. Osteoarthritis affects tissue that covers the ends of bones in joints (cartilage). Cartilage acts as a cushion between the bones and helps them move smoothly. Osteoarthritis occurs when cartilage in the joints gets worn down. Osteoarthritis is sometimes called "wear and tear" arthritis. Osteoarthritis is the most common form of arthritis. It often occurs in older people. It is a condition that gets worse over time. The joints most often affected by this condition are in the fingers, toes, hips, knees, and spine, including the neck and lower back. What are the causes? This condition is caused by the wearing down of cartilage that covers the ends of bones. What increases the risk? The following factors may make you more likely to develop this condition:  Being age 72 or older.  Obesity.  Overuse of joints.  Past injury of a joint.  Past surgery on a joint.  Family history of osteoarthritis. What are the signs or symptoms? The main symptoms of this condition are pain, swelling, and stiffness in the joint. Other symptoms may include:  An enlarged joint.  More pain and further damage caused by small pieces of bone or cartilage that break off and float inside of the joint.  Small deposits of bone (osteophytes) that grow on the edges of the joint.  A grating or scraping feeling inside the joint when you move it.  Popping or creaking sounds when you move.  Difficulty walking or exercising.  An inability to grip items, twist your hand(s), or control the movements of your hands and fingers. How is this  diagnosed? This condition may be diagnosed based on:  Your medical history.  A physical exam.  Your symptoms.  X-rays of the affected joint(s).  Blood tests to rule out other types of arthritis. How is this treated? There is no cure for this condition, but treatment can help control pain and improve joint function. Treatment may include a combination of therapies, such as:  Pain relief techniques, such as: ? Applying heat and cold to the joint. ? Massage. ? A form of talk therapy called cognitive behavioral therapy (CBT). This therapy helps you set goals and follow up on the changes that you make.  Medicines for pain and inflammation. The medicines can be taken by mouth or applied to the skin. They include: ? NSAIDs, such as ibuprofen. ? Prescription medicines. ? Strong anti-inflammatory medicines (corticosteroids). ? Certain nutritional supplements.  A prescribed exercise program. You may work with a physical therapist.  Assistive devices, such as a brace, wrap, splint, specialized glove, or cane.  A weight control plan.  Surgery, such as: ? An osteotomy. This is done to reposition the bones and relieve pain or to remove loose pieces of bone and cartilage. ? Joint replacement surgery. You may need this surgery if you have advanced osteoarthritis. Follow these instructions at home: Activity  Rest your affected joints as told by your health care provider.  Exercise as told by your health care provider. He or she may recommend specific types of exercise, such as: ? Strengthening exercises. These are done to strengthen the muscles that support joints affected by arthritis. ?  Aerobic activities. These are exercises, such as brisk walking or water aerobics, that increase your heart rate. ? Range-of-motion activities. These help your joints move more easily. ? Balance and agility exercises. Managing pain, stiffness, and swelling  If directed, apply heat to the affected area  as often as told by your health care provider. Use the heat source that your health care provider recommends, such as a moist heat pack or a heating pad. ? If you have a removable assistive device, remove it as told by your health care provider. ? Place a towel between your skin and the heat source. If your health care provider tells you to keep the assistive device on while you apply heat, place a towel between the assistive device and the heat source. ? Leave the heat on for 20-30 minutes. ? Remove the heat if your skin turns bright red. This is especially important if you are unable to feel pain, heat, or cold. You may have a greater risk of getting burned.  If directed, put ice on the affected area. To do this: ? If you have a removable assistive device, remove it as told by your health care provider. ? Put ice in a plastic bag. ? Place a towel between your skin and the bag. If your health care provider tells you to keep the assistive device on during icing, place a towel between the assistive device and the bag. ? Leave the ice on for 20 minutes, 2-3 times a day. ? Move your fingers or toes often to reduce stiffness and swelling. ? Raise (elevate) the injured area above the level of your heart while you are sitting or lying down.      General instructions  Take over-the-counter and prescription medicines only as told by your health care provider.  Maintain a healthy weight. Follow instructions from your health care provider for weight control.  Do not use any products that contain nicotine or tobacco, such as cigarettes, e-cigarettes, and chewing tobacco. If you need help quitting, ask your health care provider.  Use assistive devices as told by your health care provider.  Keep all follow-up visits as told by your health care provider. This is important. Where to find more information  [employer] of Arthritis and Musculoskeletal and Skin Diseases:  www.niams.SouthExposed.es  [employer] on Aging: http://kim-miller.com/  American College of Rheumatology: www.rheumatology.org Contact a health care provider if:  You have redness, swelling, or a feeling of warmth in a joint that gets worse.  You have a fever along with joint or muscle aches.  You develop a rash.  You have trouble doing your normal activities. Get help right away if:  You have pain that gets worse and is not relieved by pain medicine. Summary  Osteoarthritis is a type of arthritis that affects tissue covering the ends of bones in joints (cartilage).  This condition is caused by the wearing down of cartilage that covers the ends of bones.  The main symptom of this condition is pain, swelling, and stiffness in the joint.  There is no cure for this condition, but treatment can help control pain and improve joint function. This information is not intended to replace advice given to you by your health care provider. Make sure you discuss any questions you have with your health care provider. Document Revised: 09/29/2019 Document Reviewed: 09/29/2019 Elsevier Patient Education  2021 [place] Prevention in the Home, Adult Falls can cause injuries and can happen to people of all  ages. There are many things you can do to make your home safe and to help prevent falls. Ask for help when making these changes. What actions can I take to prevent falls? General Instructions  Use good lighting in all rooms. Replace any light bulbs that burn out.  Turn on the lights in dark areas. Use night-lights.  Keep items that you use often in easy-to-reach places. Lower the shelves around your home if needed.  Set up your furniture so you have a clear path. Avoid moving your furniture around.  Do not have throw rugs or other things on the floor that can make you trip.  Avoid walking on wet floors.  If any of your floors are uneven, fix them.  Add color or contrast paint or  tape to clearly mark and help you see: ? Grab bars or handrails. ? First and last steps of staircases. ? Where the edge of each step is.  If you use a stepladder: ? Make sure that it is fully opened. Do not climb a closed stepladder. ? Make sure the sides of the stepladder are locked in place. ? Ask someone to hold the stepladder while you use it.  Know where your pets are when moving through your home. What can I do in the bathroom?  Keep the floor dry. Clean up any water on the floor right away.  Remove soap buildup in the tub or shower.  Use nonskid mats or decals on the floor of the tub or shower.  Attach bath mats securely with double-sided, nonslip rug tape.  If you need to sit down in the shower, use a plastic, nonslip stool.  Install grab bars by the toilet and in the tub and shower. Do not use towel bars as grab bars.      What can I do in the bedroom?  Make sure that you have a light by your bed that is easy to reach.  Do not use any sheets or blankets for your bed that hang to the floor.  Have a firm chair with side arms that you can use for support when you get dressed. What can I do in the kitchen?  Clean up any spills right away.  If you need to reach something above you, use a step stool with a grab bar.  Keep electrical cords out of the way.  Do not use floor polish or wax that makes floors slippery. What can I do with my stairs?  Do not leave any items on the stairs.  Make sure that you have a light switch at the top and the bottom of the stairs.  Make sure that there are handrails on both sides of the stairs. Fix handrails that are broken or loose.  Install nonslip stair treads on all your stairs.  Avoid having throw rugs at the top or bottom of the stairs.  Choose a carpet that does not hide the edge of the steps on the stairs.  Check carpeting to make sure that it is firmly attached to the stairs. Fix carpet that is loose or worn. What can  I do on the outside of my home?  Use bright outdoor lighting.  Fix the edges of walkways and driveways and fix any cracks.  Remove anything that might make you trip as you walk through a door, such as a raised step or threshold.  Trim any bushes or trees on paths to your home.  Check to see if handrails are loose  or broken and that both sides of all steps have handrails.  Install guardrails along the edges of any raised decks and porches.  Clear paths of anything that can make you trip, such as tools or rocks.  Have leaves, snow, or ice cleared regularly.  Use sand or salt on paths during winter.  Clean up any spills in your garage right away. This includes grease or oil spills. What other actions can I take?  Wear shoes that: ? Have a low heel. Do not wear high heels. ? Have rubber bottoms. ? Feel good on your feet and fit well. ? Are closed at the toe. Do not wear open-toe sandals.  Use tools that help you move around if needed. These include: ? Canes. ? Walkers. ? Scooters. ? Crutches.  Review your medicines with your doctor. Some medicines can make you feel dizzy. This can increase your chance of falling. Ask your doctor what else you can do to help prevent falls. Where to find more information  Centers for Disease Control and Prevention, [employer]: http://www.wolf.info/  National Institute on Aging: http://kim-miller.com/ Contact a doctor if:  You are afraid of falling at home.  You feel weak, drowsy, or dizzy at home.  You fall at home. Summary  There are many simple things that you can do to make your home safe and to help prevent falls.  Ways to make your home safe include removing things that can make you trip and installing grab bars in the bathroom.  Ask for help when making these changes in your home. This information is not intended to replace advice given to you by your health care provider. Make sure you discuss any questions you have with your health care  provider. Document Revised: 05/05/2020 Document Reviewed: 05/05/2020 Elsevier Patient Education  2021 [place].  Musculoskeletal Pain Musculoskeletal pain refers to aches and pains in your bones, joints, muscles, and the tissues that surround them. This pain can occur in any part of the body. It can last for a short time (acute) or a long time (chronic). A physical exam, lab tests, and imaging studies may be done to find the cause of your musculoskeletal pain. Follow these instructions at home: Lifestyle  Try to control or lower your stress levels. Stress increases muscle tension and can worsen musculoskeletal pain. It is important to recognize when you are anxious or stressed and learn ways to manage it. This may include: ? Meditation or yoga. ? Cognitive or behavioral therapy. ? Acupuncture or massage therapy.  You may continue all activities unless the activities cause more pain. When the pain gets better, slowly resume your normal activities. Gradually increase the intensity and duration of your activities or exercise. Managing pain, stiffness, and swelling  Treatment may include medicines for pain and inflammation that are taken by mouth or applied to the skin. Take over-the-counter and prescription medicines only as told by your health care provider.  When your pain is severe, bed rest may be helpful. Lie or sit in any position that is comfortable, but get out of bed and walk around at least every couple of hours.  If directed, apply heat to the affected area as often as told by your health care provider. Use the heat source that your health care provider recommends, such as a moist heat pack or a heating pad. ? Place a towel between your skin and the heat source. ? Leave the heat on for 20-30 minutes. ? Remove the heat if your skin  turns bright red. This is especially important if you are unable to feel pain, heat, or cold. You may have a greater risk of getting burned.  If  directed, put ice on the painful area. To do this: ? Put ice in a plastic bag. ? Place a towel between your skin and the bag. ? Leave the ice on for 20 minutes, 2-3 times a day. ? Remove the ice if your skin turns bright red. This is very important. If you cannot feel pain, heat, or cold, you have a greater risk of damage to the area.      General instructions  Your health care provider may recommend that you see a physical therapist. This person can help you come up with a safe exercise program.  If told by your health care provider, do physical therapy exercises to improve movement and strength in the affected area.  Keep all follow-up visits. This is important. This includes any physical therapy visits. Contact a health care provider if:  Your pain gets worse.  Medicines do not help ease your pain.  You cannot use the part of your body that hurts, such as your arm, leg, or neck.  You have trouble sleeping.  You have trouble doing your normal activities. Get help right away if:  You have a new injury and your pain is worse or different.  You feel numb or you have tingling in the painful area. Summary  Musculoskeletal pain refers to aches and pains in your bones, joints, muscles, and the tissues that surround them.  This pain can occur in any part of the body.  Your health care provider may recommend that you see a physical therapist. This person can help you come up with a safe exercise program. Do any exercises as told by your physical therapist.  Lower your stress level. Stress can worsen musculoskeletal pain. Ways to lower stress may include meditation, yoga, cognitive or behavioral therapy, acupuncture, and massage therapy. This information is not intended to replace advice given to you by your health care provider. Make sure you discuss any questions you have with your health care provider. Document Revised: 02/05/2020 Document Reviewed: 01/14/2020 Elsevier Patient  Education  2021 [employer].

## 2020-11-03 NOTE — Progress Notes (Signed)
Subjective:    Patient ID: Tracy Davidson, female    DOB: [birth year], 80 y.o.   MRN: [id number]  No chief complaint on file.   HPI  Patient is an 80 year old female with pmh sig for allergies, arthritis, osteopenia, h/o bladder tumor, HLD, HTN, PVD, cataract who was seen for acute concern.  Pt fell 6 days ago after missing a step in her nephew's home.  Patient landed on her right side.  R elbow had a laceration that is healing. Denies LOC, hearing any pops, clicks, or tears. Pt continues to have right inner thigh pain.  Has not tried much for her symptoms.  Pt also endorses chronic knee pain that radiates down the lateral side of R lower leg and up lateral R thigh.  Tried OTC analgesic rubs in the past without relief.  Past Medical History:  Diagnosis Date  . ALLERGIC RHINITIS 09/20/2009  . Allergy   . Arthritis    LEGS and left arm  . Bladder tumor   . Cataract    both eyes, right worse thsn left  . COLONIC POLYPS, HX OF 06/06/2007  . DYSLIPIDEMIA 09/20/2009  . Dyspnea    sob with activity  . HYPERTENSION 06/06/2007  . Knee pain    stiff and pain on side  . LIVER FUNCTION TESTS, ABNORMAL, HX OF    feb or march 2019  . Peripheral vascular disease ([place])   . Shingles 11/2019   left arm and shoulder    Allergies  Allergen Reactions  . Doxycycline Palpitations    ROS General: Denies fever, chills, night sweats, changes in weight, changes in appetite HEENT: Denies headaches, ear pain, changes in vision, rhinorrhea, sore throat CV: Denies CP, palpitations, SOB, orthopnea Pulm: Denies SOB, cough, wheezing GI: Denies abdominal pain, nausea, vomiting, diarrhea, constipation GU: Denies dysuria, hematuria, frequency, vaginal discharge Msk: Denies muscle cramps, joint pains  + right thigh pain, right knee pain, right lower leg pain Neuro: Denies weakness, numbness, tingling Skin: Denies rashes, bruising Psych: Denies depression, anxiety, hallucinations      Objective:    Blood  pressure (!) 142/76, pulse (!) 102, temperature 98.5 F (36.9 C), temperature source Oral, weight 115 lb 12.8 oz (52.5 kg), SpO2 95 %.  Gen. Pleasant, well-nourished, in no distress, normal affect   HEENT: [place]/AT, face symmetric, conjunctiva clear, no scleral icterus, PERRLA, EOMI, nares patent without drainage Lungs: no accessory muscle use Cardiovascular: RRR, no peripheral edema Musculoskeletal: Pain with weightbearing on right leg.  Negative logroll, straight leg raise, abduction, FADIR, FABER.  Discomfort with palpation of right popliteal fossa/calf.  No deformities, no cyanosis or clubbing, normal tone, left leg without TTP. Neuro:  A&Ox3, CN II-XII intact, ambulating with a cane Skin:  Warm, dry, intact, no lesions/ rash.  Seborrheic keratosis on right lateral lower leg.   Wt Readings from Last 3 Encounters:  11/03/20 115 lb 12.8 oz (52.5 kg)  04/07/20 111 lb (50.3 kg)  04/06/20 110 lb (49.9 kg)    Lab Results  Component Value Date   WBC 6.1 03/11/2020   HGB 16.3 (H) 04/12/2020   HCT 48.0 (H) 04/12/2020   PLT 186.0 03/11/2020   GLUCOSE 88 04/12/2020   CHOL 199 01/29/2020   TRIG 60.0 01/29/2020   HDL 98.90 01/29/2020   LDLDIRECT 143.2 03/14/2011   LDLCALC 88 01/29/2020   ALT 22 10/24/2017   AST 35 10/24/2017   NA 140 04/12/2020   K 3.6 04/12/2020   CL 99 04/12/2020   CREATININE  0.70 04/12/2020   BUN 16 04/12/2020   CO2 31 03/11/2020   TSH 2.35 01/29/2020   HGBA1C 4.5 (L) 01/29/2020    Assessment/Plan:  Right thigh pain - Plan: DG Hip Unilat W OR W/O Pelvis 2-3 Views Right  Right hip pain - Plan: DG Hip Unilat W OR W/O Pelvis 2-3 Views Right  Fall from standing, initial encounter  Primary osteoarthritis of right knee - Plan: DG Knee Complete 4 Views Right, capsicum (ZOSTRIX) 0.075 % topical cream  Will obtain imaging to rule out fracture of right LE.  Discussed supportive care including topical analgesics, Tylenol, or NSAIDs as needed.  Discussed potential  treatments for knee pain including steroid injections, Supartz or Synvisc.  PT also recommended, patient declines PT at this time.  Rx for Zostrix topical cream sent to patient's pharmacy.  Further recommendations based on imaging.  Referral to Ortho placed.  F/u as needed in the next few weeks for continued or worsening symptoms.  [name], MD

## 2020-11-11 DIAGNOSIS — D090 Carcinoma in situ of bladder: Secondary | ICD-10-CM | POA: Diagnosis not present

## 2020-11-11 DIAGNOSIS — C678 Malignant neoplasm of overlapping sites of bladder: Secondary | ICD-10-CM | POA: Diagnosis not present

## 2020-11-11 DIAGNOSIS — R310 Gross hematuria: Secondary | ICD-10-CM | POA: Diagnosis not present

## 2020-11-12 ENCOUNTER — Other Ambulatory Visit: Payer: Self-pay | Admitting: Family Medicine

## 2020-11-12 DIAGNOSIS — K219 Gastro-esophageal reflux disease without esophagitis: Secondary | ICD-10-CM

## 2020-11-15 ENCOUNTER — Other Ambulatory Visit: Payer: Self-pay

## 2020-11-15 ENCOUNTER — Ambulatory Visit
Admission: RE | Admit: 2020-11-15 | Discharge: 2020-11-15 | Disposition: A | Discharge status: Home / Self Care | Payer: Medicare Other | Source: Ambulatory Visit | Attending: Family Medicine | Admitting: Family Medicine

## 2020-11-15 DIAGNOSIS — Z1231 Encounter for screening mammogram for malignant neoplasm of breast: Secondary | ICD-10-CM | POA: Diagnosis not present

## 2020-11-16 ENCOUNTER — Other Ambulatory Visit: Payer: Self-pay | Admitting: Family Medicine

## 2020-11-16 DIAGNOSIS — R928 Other abnormal and inconclusive findings on diagnostic imaging of breast: Secondary | ICD-10-CM

## 2020-11-17 ENCOUNTER — Ambulatory Visit: Payer: Self-pay

## 2020-11-17 ENCOUNTER — Other Ambulatory Visit: Payer: Self-pay

## 2020-11-17 ENCOUNTER — Ambulatory Visit (INDEPENDENT_AMBULATORY_CARE_PROVIDER_SITE_OTHER): Payer: Medicare Other | Admitting: Orthopedic Surgery

## 2020-11-17 DIAGNOSIS — S32591S Other specified fracture of right pubis, sequela: Secondary | ICD-10-CM

## 2020-11-17 DIAGNOSIS — M79604 Pain in right leg: Secondary | ICD-10-CM

## 2020-11-19 ENCOUNTER — Encounter: Payer: Self-pay | Admitting: Orthopedic Surgery

## 2020-11-19 NOTE — Progress Notes (Signed)
Office Visit Note   Patient: Tracy Davidson           Date of Birth: Feb 21, 1941           MRN: [id number] Visit Date: 11/17/2020 Requested by: Billie Ruddy, MD [place],  [place] [id number] PCP: Billie Ruddy, MD  Subjective: Chief Complaint  Patient presents with  . Right Leg - Pain    HPI: Tracy Davidson is an 80 year old patient with right hip and thigh pain.  She had a fall January 14 in [place].  She ambulates with a cane.  She lives by herself.  She states she is able to get around okay with a cane.  Fell onto the right-hand side but she is able to walk.              ROS: All systems reviewed are negative as they relate to the chief complaint within the history of present illness.  Patient denies  fevers or chills.   Assessment & Plan: Visit Diagnoses:  1. Pain in right leg   2. Fracture of multiple pubic rami, right, sequela     Plan: Impression is nondisplaced pubic rami fracture on the right-hand side with no other hip fracture present.  She is about 2 weeks out from that.  In general she has a little bit of pain with adduction.  Anticipate full healing and return to preinjury level of function within the next 4 weeks.  Follow-up as needed.  No surgical indication at this time.  Follow-Up Instructions: Return if symptoms worsen or fail to improve.   Orders:  Orders Placed This Encounter  Procedures  . XR FEMUR, MIN 2 VIEWS RIGHT   No orders of the defined types were placed in this encounter.     Procedures: No procedures performed   Clinical Data: No additional findings.  Objective: Vital Signs: There were no vitals taken for this visit.  Physical Exam:   Constitutional: Patient appears well-developed HEENT:  Head: Normocephalic Eyes:EOM are normal Neck: Normal range of motion Cardiovascular: Normal rate Pulmonary/chest: Effort normal Neurologic: Patient is alert Skin: Skin is warm Psychiatric: Patient has normal mood and  affect    Ortho Exam: Ortho exam demonstrates mild pain with resisted adduction on the right compared to the left.  Not too much hip pain with internal extra rotation of the right leg.  Pedal pulses palpable.  Ankle and knee range of motion intact with no effusion or crepitus on either side.  Specialty Comments:  No specialty comments available.  Imaging: No results found.   PMFS History: Patient Active Problem List   Diagnosis Date Noted  . 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 Sinus tachycardia 01/30/2020  . SOB (shortness of breath) on exertion 01/30/2020  . Hypercalcemia 01/30/2020  . PAD (peripheral artery disease) ([place]) 07/29/2019  . Tobacco abuse 03/14/2012  . LIVER FUNCTION TESTS, ABNORMAL, HX OF 09/21/2009  . Dyslipidemia 09/20/2009  . ALLERGIC RHINITIS 09/20/2009  . Essential hypertension 06/06/2007  . COLONIC POLYPS, HX OF 06/06/2007   Past Medical History:  Diagnosis Date  . ALLERGIC RHINITIS 09/20/2009  . Allergy   . Arthritis    LEGS and left arm  . Bladder tumor   . Cataract    both eyes, right worse thsn left  . COLONIC POLYPS, HX OF 06/06/2007  . DYSLIPIDEMIA 09/20/2009  . Dyspnea    sob with activity  . HYPERTENSION 06/06/2007  . Knee pain    stiff and pain on  side  . LIVER FUNCTION TESTS, ABNORMAL, HX OF    feb or march 2019  . Peripheral vascular disease ([facility])   . Shingles 11/2019   left arm and shoulder    Family History  Problem Relation Age of Onset  . Colon cancer Neg Hx   . Colon polyps Neg Hx   . Esophageal cancer Neg Hx   . Rectal cancer Neg Hx   . Stomach cancer Neg Hx   . Pancreatic cancer Neg Hx   . Breast cancer Neg Hx     Past Surgical History:  Procedure Laterality Date  . ABDOMINAL AORTOGRAM W/LOWER EXTREMITY Bilateral 03/03/2020   Procedure: ABDOMINAL AORTOGRAM W/LOWER EXTREMITY;  Surgeon: Marty Heck, MD;  Location: [facility];  Service: Cardiovascular;  Laterality: Bilateral;  . BREAST EXCISIONAL BIOPSY Left 1975   . COLONOSCOPY  ?2015  . CYSTOSCOPY WITH BIOPSY N/A 04/12/2020   Procedure: CYSTOSCOPY WITH BIOPSY  INSTILLATION OF GEMCITABINE;  Surgeon: Lucas Mallow, MD;  Location: [facility];  Service: Urology;  Laterality: N/A;  . DILATION AND CURETTAGE OF UTERUS  yrs ago  . PERIPHERAL VASCULAR INTERVENTION Right 03/03/2020   Procedure: PERIPHERAL VASCULAR INTERVENTION;  Surgeon: Marty Heck, MD;  Location: [facility];  Service: Cardiovascular;  Laterality: Right;  . TONSILLECTOMY  1963  . TOTAL ABDOMINAL HYSTERECTOMY  yrs ago   complete  . TRANSURETHRAL RESECTION OF BLADDER TUMOR N/A 09/18/2018   Procedure: RESTAGE TRANSURETHRAL RESECTION OF BLADDER TUMOR (TURBT);  Surgeon: Lucas Mallow, MD;  [facility];  Service: Urology;  Laterality: N/A;  . TRANSURETHRAL RESECTION OF BLADDER TUMOR WITH MITOMYCIN-C N/A 08/16/2018   Procedure: TRANSURETHRAL RESECTION OF BLADDER TUMOR;  Surgeon: Lucas Mallow, MD;  Location: [facility];  Service: Urology;  Laterality: N/A;   Social History   Occupational History  . Occupation: retired  Tobacco Use  . Smoking status: Current Every Day Smoker    Packs/day: 1.00    Years: 50.00    Pack years: 50.00    Types: Cigarettes  . Smokeless tobacco: Never Used  Vaping Use  . Vaping Use: Never used  Substance and Sexual Activity  . Alcohol use: Not Currently    Alcohol/week: 2.0 standard drinks    Types: 2 Shots of liquor per week    Comment: none last month as of 04-07-2020  . Drug use: No  . Sexual activity: Not on file

## 2020-11-30 ENCOUNTER — Ambulatory Visit
Admission: RE | Admit: 2020-11-30 | Discharge: 2020-11-30 | Disposition: A | Discharge status: Home / Self Care | Payer: Medicare Other | Source: Ambulatory Visit | Attending: Family Medicine | Admitting: Family Medicine

## 2020-11-30 ENCOUNTER — Other Ambulatory Visit: Payer: Self-pay

## 2020-11-30 ENCOUNTER — Other Ambulatory Visit: Payer: Self-pay | Admitting: Family Medicine

## 2020-11-30 DIAGNOSIS — N6001 Solitary cyst of right breast: Secondary | ICD-10-CM | POA: Diagnosis not present

## 2020-11-30 DIAGNOSIS — N6324 Unspecified lump in the left breast, lower inner quadrant: Secondary | ICD-10-CM | POA: Diagnosis not present

## 2020-11-30 DIAGNOSIS — R928 Other abnormal and inconclusive findings on diagnostic imaging of breast: Secondary | ICD-10-CM

## 2020-11-30 DIAGNOSIS — R922 Inconclusive mammogram: Secondary | ICD-10-CM | POA: Diagnosis not present

## 2020-12-01 ENCOUNTER — Other Ambulatory Visit: Payer: Self-pay | Admitting: Family Medicine

## 2020-12-01 ENCOUNTER — Other Ambulatory Visit (HOSPITAL_COMMUNITY): Payer: Self-pay | Admitting: Diagnostic Radiology

## 2020-12-01 ENCOUNTER — Ambulatory Visit
Admission: RE | Admit: 2020-12-01 | Discharge: 2020-12-01 | Disposition: A | Discharge status: Home / Self Care | Payer: Medicare Other | Source: Ambulatory Visit | Attending: Family Medicine | Admitting: Family Medicine

## 2020-12-01 DIAGNOSIS — R928 Other abnormal and inconclusive findings on diagnostic imaging of breast: Secondary | ICD-10-CM

## 2020-12-01 DIAGNOSIS — N6324 Unspecified lump in the left breast, lower inner quadrant: Secondary | ICD-10-CM | POA: Diagnosis not present

## 2020-12-01 DIAGNOSIS — D242 Benign neoplasm of left breast: Secondary | ICD-10-CM | POA: Diagnosis not present

## 2020-12-14 DIAGNOSIS — Z5111 Encounter for antineoplastic chemotherapy: Secondary | ICD-10-CM | POA: Diagnosis not present

## 2020-12-14 DIAGNOSIS — C678 Malignant neoplasm of overlapping sites of bladder: Secondary | ICD-10-CM | POA: Diagnosis not present

## 2020-12-21 ENCOUNTER — Other Ambulatory Visit: Payer: Self-pay | Admitting: Family Medicine

## 2020-12-21 DIAGNOSIS — C678 Malignant neoplasm of overlapping sites of bladder: Secondary | ICD-10-CM | POA: Diagnosis not present

## 2020-12-21 DIAGNOSIS — Z5111 Encounter for antineoplastic chemotherapy: Secondary | ICD-10-CM | POA: Diagnosis not present

## 2020-12-21 DIAGNOSIS — I10 Essential (primary) hypertension: Secondary | ICD-10-CM

## 2020-12-27 ENCOUNTER — Other Ambulatory Visit: Payer: Self-pay | Admitting: General Surgery

## 2020-12-27 DIAGNOSIS — R928 Other abnormal and inconclusive findings on diagnostic imaging of breast: Secondary | ICD-10-CM | POA: Diagnosis not present

## 2020-12-28 DIAGNOSIS — C678 Malignant neoplasm of overlapping sites of bladder: Secondary | ICD-10-CM | POA: Diagnosis not present

## 2020-12-28 DIAGNOSIS — Z5111 Encounter for antineoplastic chemotherapy: Secondary | ICD-10-CM | POA: Diagnosis not present

## 2021-01-05 NOTE — H&P (Signed)
[name] Appointment: 12/27/2020 10:45 AM Location: [facility] Patient #: [id number] DOB: Dec 28, 1940 Single / Language: Tracy Davidson / Race: Black or African American Female   History of Present Illness Stark Klein MD; 12/27/2020 11:37 AM) The patient is a 80 year old female who presents with a complaint of Breast problems. Pt is an 80 yo F referred for an abnormal left mammogram with discordant biopsy. She was called back from screening mammogram for bilateral possible breast masses. The right breast mass resolved out as a cyst. The left mass however was seen to be 1.3 cm at 8 o'clock. Core needle biopsy showed a papilloma. She denies prior personal history of cancer or family history of cancer. She has had a stent placed in her left pelvis last year by Dr. Carlis Abbott.   Pathology 12/01/20 Breast, left, needle core biopsy, lower outer quadrant, 8 o'clock, 3cmfn - HYALINIZED INTRADUCTAL PAPILLOMA WITH CALCIFICATIONS.  dx mammo/us bilaterally 11/30/20 EXAM: DIGITAL DIAGNOSTIC BILATERAL MAMMOGRAM WITH TOMOSYNTHESIS AND CAD; ULTRASOUND RIGHT BREAST LIMITED; ULTRASOUND LEFT BREAST LIMITED  TECHNIQUE: Bilateral digital diagnostic mammography and breast tomosynthesis was performed. The images were evaluated with computer-aided detection.; Targeted ultrasound examination of the right breast was performed; Targeted ultrasound examination of the left breast was performed  COMPARISON: Previous exam(s).  ACR Breast Density Category b: There are scattered areas of fibroglandular density.  FINDINGS: There is a persistent oval, circumscribed lobulated mass in the upper outer right breast. Further evaluation with ultrasound was performed.  There is a persistent tortuous duct behind the left nipple with associated coarse calcifications. Further evaluation with ultrasound was performed.  Targeted ultrasound is performed, showing an oval, circumscribed anechoic mass at the 2  o'clock position 3 cm from the nipple on the right. It measures 10 x 7 x 5 mm. There is no internal vascularity. This is consistent with a benign simple cyst and corresponds with the screening mammographic finding.  Evaluation of the left breast demonstrates a dilated duct with an internal hypoechoic mass at the 8 o'clock position 3 cm from the nipple. Exact measurements are difficult due to the tortuous nature of the fat duct but measures approximately 1.3 x 0.6 x 0.7 cm. There is mild internal blood flow. Echogenic foci along the periphery of the mass may represent the calcifications identified mammographically.  Evaluation of the left axilla demonstrates no suspicious lymphadenopathy.  IMPRESSION: 1. Indeterminate intraductal mass in the left breast 8 o'clock position with associated echogenic foci likely representing calcifications. Recommendation is for ultrasound-guided biopsy with close attention on post clip films to ensure mammographic correlation. Consider specimen radiograph at the time of biopsy. 2. No suspicious left axillary lymphadenopathy. 3. Benign right breast simple cyst corresponding with the screening mammographic findings. No further imaging follow-up required.  RECOMMENDATION: Ultrasound-guided biopsy of the left breast.  I have discussed the findings and recommendations with the patient. If applicable, a reminder letter will be sent to the patient regarding the next appointment.  BI-RADS CATEGORY 4: Suspicious.   Past Surgical History Hortencia Conradi, CMA; 12/27/2020 10:52 AM) Breast Biopsy  Left. Cataract Surgery  Left. Tonsillectomy   Diagnostic Studies History Hortencia Conradi, CMA; 12/27/2020 10:52 AM) Colonoscopy  1-5 years ago Mammogram  within last year Pap Smear  >5 years ago  Allergies (Kheana Marshall-McBride, CMA; 12/27/2020 10:53 AM) Amoxicillin *PENICILLINS*  Allergies Reconciled   Medication History (Kheana  Marshall-McBride, CMA; 12/27/2020 10:56 AM) Atorvastatin Calcium (10MG  Tablet, Oral) Active. Cilostazol (100MG  Tablet, Oral) Active. Clopidogrel Bisulfate (75MG  Tablet, Oral)  Active. Klor-Con M20 [facility] Tablet ER, Oral) Active. Omeprazole (20MG  Capsule DR, Oral) Active. Lisinopril-hydroCHLOROthiazide (20-25MG  Tablet, Oral) Active. prednisoLONE Acetate (1% Suspension, Ophthalmic) Active. Ofloxacin (0.3% Solution, Ophthalmic) Active. Arthritis Pain Relieving (0.075% Cream, External) Active. Medications Reconciled  Social History Hortencia Conradi, CMA; 12/27/2020 10:52 AM) Caffeine use  Tea. Tobacco use  Current every day smoker.  Family History Hortencia Conradi, CMA; 12/27/2020 10:52 AM) Hypertension  Mother.  Pregnancy / Birth History Hortencia Conradi, CMA; 12/27/2020 10:52 AM) Age at menarche  48 years. Age of menopause  51-55 Contraceptive History  Oral contraceptives. Gravida  0  Other Problems Sherron Ales Marshall-McBride, CMA; 12/27/2020 10:52 AM) Arthritis  Back Pain  Gastroesophageal Reflux Disease  High blood pressure     Review of Systems Stark Klein MD; 12/27/2020 11:37 AM) General Not Present- Appetite Loss, Chills, Fatigue, Fever, Night Sweats, Weight Gain and Weight Loss. HEENT Present- Seasonal Allergies and Wears glasses/contact lenses. Not Present- Earache, Hearing Loss, Hoarseness, Nose Bleed, Oral Ulcers, Ringing in the Ears, Sinus Pain, Sore Throat, Visual Disturbances and Yellow Eyes. Respiratory Present- Snoring. Not Present- Bloody sputum, Chronic Cough, Difficulty Breathing and Wheezing. Breast Not Present- Breast Mass, Breast Pain, Nipple Discharge and Skin Changes. Cardiovascular Present- Shortness of Breath. Not Present- Chest Pain, Difficulty Breathing Lying Down, Leg Cramps, Palpitations, Rapid Heart Rate and Swelling of Extremities. Neurological Not Present- Decreased Memory, Fainting, Headaches, Numbness,  Seizures, Tingling, Tremor, Trouble walking and Weakness. Psychiatric Not Present- Anxiety, Bipolar, Change in Sleep Pattern, Depression, Fearful and Frequent crying. Endocrine Not Present- Cold Intolerance, Excessive Hunger, Hair Changes, Heat Intolerance, Hot flashes and New Diabetes. All other systems negative  Vitals (Kheana Marshall-McBride CMA; 12/27/2020 10:58 AM) 12/27/2020 10:57 AM Weight: 117.13 lb Height: 63in Body Surface Area: 1.54 m Body Mass Index: 20.75 kg/m  Temp.: 98.74F  Pulse: 100 (Regular)  P.OX: 100% (Room air) BP: 138/84(Sitting, Left Arm, Standard)       Physical Exam Stark Klein MD; 12/27/2020 11:38 AM) General Mental Status-Alert. General Appearance-Consistent with stated age. Hydration-Well hydrated. Voice-Normal.  Head and Neck Head-normocephalic, atraumatic with no lesions or palpable masses. Trachea-midline. Thyroid Gland Characteristics - normal size and consistency.  Eye Eyeball - Bilateral-Extraocular movements intact. Sclera/Conjunctiva - Bilateral-No scleral icterus.  Chest and Lung Exam Chest and lung exam reveals -quiet, even and easy respiratory effort with no use of accessory muscles and on auscultation, normal breath sounds, no adventitious sounds and normal vocal resonance. Inspection Chest Wall - Normal. Back - normal.  Breast Note: breasts are relatively symmetric. ptotic bilaterally. no palpable masses. no skin dimpling. no nipple discharge or nipple retraction. No LAD.   Cardiovascular Cardiovascular examination reveals -normal heart sounds, regular rate and rhythm with no murmurs and normal pedal pulses bilaterally.  Abdomen Inspection Inspection of the abdomen reveals - No Hernias. Palpation/Percussion Palpation and Percussion of the abdomen reveal - Soft, Non Tender, No Rebound tenderness, No Rigidity (guarding) and No hepatosplenomegaly. Auscultation Auscultation of the abdomen  reveals - Bowel sounds normal.  Neurologic Neurologic evaluation reveals -alert and oriented x 3 with no impairment of recent or remote memory. Mental Status-Normal.  Musculoskeletal Global Assessment -Note: no gross deformities.  Normal Exam - Left-Upper Extremity Strength Normal and Lower Extremity Strength Normal. Normal Exam - Right-Upper Extremity Strength Normal and Lower Extremity Strength Normal.  Lymphatic Head & Neck  General Head & Neck Lymphatics: Bilateral - Description - Normal. Axillary  General Axillary Region: Bilateral - Description - Normal. Tenderness - Non Tender. Femoral & Inguinal  Generalized Femoral &  Inguinal Lymphatics: Bilateral - Description - No Generalized lymphadenopathy.    Assessment & Plan Stark Klein MD; 12/27/2020 11:39 AM) ABNORMAL MAMMOGRAM OF LEFT BREAST (R92.8) Impression: Pt with 1.3 cm abnormality and papilloma on core needle biopsy which is discordant.  Will plan seed localized excision.  The surgical procedure was described to the patient. I discussed the incision type and location and that we would need radiology involved on with a wire or seed marker and/or sentinel node.  The risks and benefits of the procedure were described to the patient and she wishes to proceed.  We discussed the risks bleeding, infection, damage to other structures, need for further procedures/surgeries. We discussed the risk of seroma. The patient was advised if the area in the breast in cancer, we may need to go back to surgery for additional tissue to obtain negative margins or for a lymph node biopsy. The patient was advised that these are the most common complications, but that others can occur as well. They were advised against taking aspirin or other anti-inflammatory agents/blood thinners the week before surgery. Current Plans Pt Education - [facility] Breast Biopsy [facility]: discussed with patient and provided information. You are being scheduled for  surgery- Our schedulers will call you.  You should hear from our office's scheduling department within 5 working days about the location, date, and time of surgery. We try to make accommodations for patient's preferences in scheduling surgery, but sometimes the OR schedule or the surgeon's schedule prevents [place] from making those accommodations.  If you have not heard from our office [phone]) in 5 working days, call the office and ask for your surgeon's nurse.  If you have other questions about your diagnosis, plan, or surgery, call the office and ask for your surgeon's nurse.    Signed electronically by Stark Klein, MD (12/27/2020 11:39 AM)

## 2021-01-07 ENCOUNTER — Other Ambulatory Visit: Payer: Self-pay | Admitting: General Surgery

## 2021-01-07 ENCOUNTER — Other Ambulatory Visit (HOSPITAL_COMMUNITY): Payer: Medicare Other

## 2021-01-07 DIAGNOSIS — R928 Other abnormal and inconclusive findings on diagnostic imaging of breast: Secondary | ICD-10-CM

## 2021-01-24 ENCOUNTER — Other Ambulatory Visit: Payer: Self-pay

## 2021-01-24 ENCOUNTER — Encounter (HOSPITAL_BASED_OUTPATIENT_CLINIC_OR_DEPARTMENT_OTHER): Payer: Self-pay | Admitting: General Surgery

## 2021-01-24 NOTE — Progress Notes (Signed)
Message left for [facility] at Dr. Chuck Hint office to get ok to hold ASA prior to surgery. Patient has a recent history of stent placement by Dr. Carlis Abbott, vascular surgery. Awaiting call back at this time.

## 2021-01-31 ENCOUNTER — Other Ambulatory Visit (HOSPITAL_COMMUNITY)
Admission: RE | Admit: 2021-01-31 | Discharge: 2021-01-31 | Disposition: A | Discharge status: Home / Self Care | Payer: Medicare Other | Source: Ambulatory Visit | Attending: General Surgery | Admitting: General Surgery

## 2021-01-31 ENCOUNTER — Encounter (HOSPITAL_BASED_OUTPATIENT_CLINIC_OR_DEPARTMENT_OTHER)
Admission: RE | Admit: 2021-01-31 | Discharge: 2021-01-31 | Disposition: A | Discharge status: Home / Self Care | Payer: Medicare Other | Source: Ambulatory Visit | Attending: General Surgery | Admitting: General Surgery

## 2021-01-31 DIAGNOSIS — Z01818 Encounter for other preprocedural examination: Secondary | ICD-10-CM | POA: Insufficient documentation

## 2021-01-31 DIAGNOSIS — Z01812 Encounter for preprocedural laboratory examination: Secondary | ICD-10-CM | POA: Diagnosis not present

## 2021-01-31 DIAGNOSIS — Z20822 Contact with and (suspected) exposure to covid-19: Secondary | ICD-10-CM | POA: Insufficient documentation

## 2021-01-31 LAB — BASIC METABOLIC PANEL
Anion gap: 9 (ref 5–15)
BUN: 8 mg/dL (ref 8–23)
CO2: 32 mmol/L (ref 22–32)
Calcium: 10.5 mg/dL — ABNORMAL HIGH (ref 8.9–10.3)
Chloride: 99 mmol/L (ref 98–111)
Creatinine, Ser: 0.73 mg/dL (ref 0.44–1.00)
GFR, Estimated: >60 mL/min (ref >60)
Glucose, Bld: 91 mg/dL (ref 70–99)
Potassium: 4.6 mmol/L (ref 3.5–5.1)
Sodium: 140 mmol/L (ref 135–145)

## 2021-01-31 LAB — SARS CORONAVIRUS 2 (TAT 6-24 HRS): SARS Coronavirus 2: NEGATIVE

## 2021-01-31 NOTE — Progress Notes (Signed)

## 2021-02-01 ENCOUNTER — Other Ambulatory Visit: Payer: Self-pay

## 2021-02-01 ENCOUNTER — Ambulatory Visit
Admission: RE | Admit: 2021-02-01 | Discharge: 2021-02-01 | Disposition: A | Discharge status: Home / Self Care | Payer: Medicare Other | Source: Ambulatory Visit | Attending: General Surgery | Admitting: General Surgery

## 2021-02-01 DIAGNOSIS — R928 Other abnormal and inconclusive findings on diagnostic imaging of breast: Secondary | ICD-10-CM

## 2021-02-01 DIAGNOSIS — R921 Mammographic calcification found on diagnostic imaging of breast: Secondary | ICD-10-CM | POA: Diagnosis not present

## 2021-02-01 NOTE — H&P (Signed)
Tracy Davidson Location: [facility] Patient #: [id number] DOB: June 06, 1941 Single / Language: [name] / Race: Black or African American Female   History of Present Illness  The patient is a 80 year old female who presents with a complaint of Breast problems. Pt is an 80 yo F referred for an abnormal left mammogram with discordant biopsy. She was called back from screening mammogram for bilateral possible breast masses. The right breast mass resolved out as a cyst. The left mass however was seen to be 1.3 cm at 8 o'clock. Core needle biopsy showed a papilloma. She denies prior personal history of cancer or family history of cancer. She has had a stent placed in her left pelvis last year by Dr. Carlis Abbott.   Pathology 12/01/20 Breast, left, needle core biopsy, lower outer quadrant, 8 o'clock, 3cmfn - HYALINIZED INTRADUCTAL PAPILLOMA WITH CALCIFICATIONS.  dx mammo/us bilaterally 11/30/20 EXAM: DIGITAL DIAGNOSTIC BILATERAL MAMMOGRAM WITH TOMOSYNTHESIS AND CAD; ULTRASOUND RIGHT BREAST LIMITED; ULTRASOUND LEFT BREAST LIMITED  TECHNIQUE: Bilateral digital diagnostic mammography and breast tomosynthesis was performed. The images were evaluated with computer-aided detection.; Targeted ultrasound examination of the right breast was performed; Targeted ultrasound examination of the left breast was performed  COMPARISON: Previous exam(s).  ACR Breast Density Category b: There are scattered areas of fibroglandular density.  FINDINGS: There is a persistent oval, circumscribed lobulated mass in the upper outer right breast. Further evaluation with ultrasound was performed.  There is a persistent tortuous duct behind the left nipple with associated coarse calcifications. Further evaluation with ultrasound was performed.  Targeted ultrasound is performed, showing an oval, circumscribed anechoic mass at the 2 o'clock position 3 cm from the nipple on the right. It measures 10 x 7  x 5 mm. There is no internal vascularity. This is consistent with a benign simple cyst and corresponds with the screening mammographic finding.  Evaluation of the left breast demonstrates a dilated duct with an internal hypoechoic mass at the 8 o'clock position 3 cm from the nipple. Exact measurements are difficult due to the tortuous nature of the fat duct but measures approximately 1.3 x 0.6 x 0.7 cm. There is mild internal blood flow. Echogenic foci along the periphery of the mass may represent the calcifications identified mammographically.  Evaluation of the left axilla demonstrates no suspicious lymphadenopathy.  IMPRESSION: 1. Indeterminate intraductal mass in the left breast 8 o'clock position with associated echogenic foci likely representing calcifications. Recommendation is for ultrasound-guided biopsy with close attention on post clip films to ensure mammographic correlation. Consider specimen radiograph at the time of biopsy. 2. No suspicious left axillary lymphadenopathy. 3. Benign right breast simple cyst corresponding with the screening mammographic findings. No further imaging follow-up required.  RECOMMENDATION: Ultrasound-guided biopsy of the left breast.  I have discussed the findings and recommendations with the patient. If applicable, a reminder letter will be sent to the patient regarding the next appointment.  BI-RADS CATEGORY 4: Suspicious.   Past Surgical History  Breast Biopsy  Left. Cataract Surgery  Left. Tonsillectomy   Diagnostic Studies History  Colonoscopy  1-5 years ago Mammogram  within last year Pap Smear  >5 years ago  Allergies Amoxicillin *PENICILLINS*  Allergies Reconciled   Medication History  Atorvastatin Calcium (10MG  Tablet, Oral) Active. Cilostazol (100MG  Tablet, Oral) Active. Clopidogrel Bisulfate (75MG  Tablet, Oral) Active. Klor-Con M20 [facility] Tablet ER, Oral) Active. Omeprazole (20MG  Capsule DR, Oral)  Active. Lisinopril-hydroCHLOROthiazide (20-25MG  Tablet, Oral) Active. prednisoLONE Acetate (1% Suspension, Ophthalmic) Active. Ofloxacin (0.3% Solution, Ophthalmic) Active. Arthritis  Pain Relieving (0.075% Cream, External) Active. Medications Reconciled  Social History Caffeine use  Tea. Tobacco use  Current every day smoker.  Family History  Hypertension  Mother.  Pregnancy / Birth History  Age at menarche  68 years. Age of menopause  [age] Contraceptive History  Oral contraceptives. Gravida  0  Other Problems Arthritis  Back Pain  Gastroesophageal Reflux Disease  High blood pressure     Review of Systems  General Not Present- Appetite Loss, Chills, Fatigue, Fever, Night Sweats, Weight Gain and Weight Loss. HEENT Present- Seasonal Allergies and Wears glasses/contact lenses. Not Present- Earache, Hearing Loss, Hoarseness, Nose Bleed, Oral Ulcers, Ringing in the Ears, Sinus Pain, Sore Throat, Visual Disturbances and Yellow Eyes. Respiratory Present- Snoring. Not Present- Bloody sputum, Chronic Cough, Difficulty Breathing and Wheezing. Breast Not Present- Breast Mass, Breast Pain, Nipple Discharge and Skin Changes. Cardiovascular Present- Shortness of Breath. Not Present- Chest Pain, Difficulty Breathing Lying Down, Leg Cramps, Palpitations, Rapid Heart Rate and Swelling of Extremities. Neurological Not Present- Decreased Memory, Fainting, Headaches, Numbness, Seizures, Tingling, Tremor, Trouble walking and Weakness. Psychiatric Not Present- Anxiety, Bipolar, Change in Sleep Pattern, Depression, Fearful and Frequent crying. Endocrine Not Present- Cold Intolerance, Excessive Hunger, Hair Changes, Heat Intolerance, Hot flashes and New Diabetes. All other systems negative  Vitals  Weight: 117.13 lb Height: 63in Body Surface Area: 1.54 m Body Mass Index: 20.75 kg/m  Temp.: 98.40F  Pulse: 100 (Regular)  P.OX: 100% (Room air) BP: 138/84(Sitting, Left  Arm, Standard)       Physical Exam  General Mental Status-Alert. General Appearance-Consistent with stated age. Hydration-Well hydrated. Voice-Normal.  Head and Neck Head-normocephalic, atraumatic with no lesions or palpable masses. Trachea-midline. Thyroid Gland Characteristics - normal size and consistency.  Eye Eyeball - Bilateral-Extraocular movements intact. Sclera/Conjunctiva - Bilateral-No scleral icterus.  Chest and Lung Exam Chest and lung exam reveals -quiet, even and easy respiratory effort with no use of accessory muscles and on auscultation, normal breath sounds, no adventitious sounds and normal vocal resonance. Inspection Chest Wall - Normal. Back - normal.  Breast Note: breasts are relatively symmetric. ptotic bilaterally. no palpable masses. no skin dimpling. no nipple discharge or nipple retraction. No LAD.   Cardiovascular Cardiovascular examination reveals -normal heart sounds, regular rate and rhythm with no murmurs and normal pedal pulses bilaterally.  Abdomen Inspection Inspection of the abdomen reveals - No Hernias. Palpation/Percussion Palpation and Percussion of the abdomen reveal - Soft, Non Tender, No Rebound tenderness, No Rigidity (guarding) and No hepatosplenomegaly. Auscultation Auscultation of the abdomen reveals - Bowel sounds normal.  Neurologic Neurologic evaluation reveals -alert and oriented x 3 with no impairment of recent or remote memory. Mental Status-Normal.  Musculoskeletal Global Assessment -Note: no gross deformities.  Normal Exam - Left-Upper Extremity Strength Normal and Lower Extremity Strength Normal. Normal Exam - Right-Upper Extremity Strength Normal and Lower Extremity Strength Normal.  Lymphatic Head & Neck  General Head & Neck Lymphatics: Bilateral - Description - Normal. Axillary  General Axillary Region: Bilateral - Description - Normal. Tenderness - Non  Tender. Femoral & Inguinal  Generalized Femoral & Inguinal Lymphatics: Bilateral - Description - No Generalized lymphadenopathy.    Assessment & Plan  ABNORMAL MAMMOGRAM OF LEFT BREAST (R92.8) Impression: Pt with 1.3 cm abnormality and papilloma on core needle biopsy which is discordant.  Will plan seed localized excision.  The surgical procedure was described to the patient. I discussed the incision type and location and that we would need radiology involved on with a wire or  seed marker and/or sentinel node.  The risks and benefits of the procedure were described to the patient and she wishes to proceed.  We discussed the risks bleeding, infection, damage to other structures, need for further procedures/surgeries. We discussed the risk of seroma. The patient was advised if the area in the breast in cancer, we may need to go back to surgery for additional tissue to obtain negative margins or for a lymph node biopsy. The patient was advised that these are the most common complications, but that others can occur as well. They were advised against taking aspirin or other anti-inflammatory agents/blood thinners the week before surgery. Current Plans Pt Education - [facility] Breast Biopsy HCI: discussed with patient and provided information. You are being scheduled for surgery- Our schedulers will call you.  You should hear from our office's scheduling department within 5 working days about the location, date, and time of surgery. We try to make accommodations for patient's preferences in scheduling surgery, but sometimes the OR schedule or the surgeon's schedule prevents [place] from making those accommodations.  If you have not heard from our office [phone]) in 5 working days, call the office and ask for your surgeon's nurse.  If you have other questions about your diagnosis, plan, or surgery, call the office and ask for your surgeon's nurse.

## 2021-02-02 ENCOUNTER — Ambulatory Visit (HOSPITAL_BASED_OUTPATIENT_CLINIC_OR_DEPARTMENT_OTHER): Payer: Medicare Other | Admitting: Anesthesiology

## 2021-02-02 ENCOUNTER — Encounter (HOSPITAL_BASED_OUTPATIENT_CLINIC_OR_DEPARTMENT_OTHER)
Admission: RE | Disposition: A | Discharge status: Home / Self Care | Payer: Self-pay | Source: Home / Self Care | Attending: General Surgery

## 2021-02-02 ENCOUNTER — Encounter (HOSPITAL_BASED_OUTPATIENT_CLINIC_OR_DEPARTMENT_OTHER): Payer: Self-pay | Admitting: General Surgery

## 2021-02-02 ENCOUNTER — Other Ambulatory Visit: Payer: Self-pay

## 2021-02-02 ENCOUNTER — Ambulatory Visit
Admission: RE | Admit: 2021-02-02 | Discharge: 2021-02-02 | Disposition: A | Discharge status: Home / Self Care | Payer: Medicare Other | Source: Ambulatory Visit | Attending: General Surgery | Admitting: General Surgery

## 2021-02-02 ENCOUNTER — Ambulatory Visit (HOSPITAL_BASED_OUTPATIENT_CLINIC_OR_DEPARTMENT_OTHER)
Admission: RE | Admit: 2021-02-02 | Discharge: 2021-02-02 | Disposition: A | Discharge status: Home / Self Care | Payer: Medicare Other | Attending: General Surgery | Admitting: General Surgery

## 2021-02-02 DIAGNOSIS — Z880 Allergy status to penicillin: Secondary | ICD-10-CM | POA: Insufficient documentation

## 2021-02-02 DIAGNOSIS — Z79899 Other long term (current) drug therapy: Secondary | ICD-10-CM | POA: Diagnosis not present

## 2021-02-02 DIAGNOSIS — I10 Essential (primary) hypertension: Secondary | ICD-10-CM | POA: Diagnosis not present

## 2021-02-02 DIAGNOSIS — R928 Other abnormal and inconclusive findings on diagnostic imaging of breast: Secondary | ICD-10-CM | POA: Diagnosis not present

## 2021-02-02 DIAGNOSIS — D242 Benign neoplasm of left breast: Secondary | ICD-10-CM | POA: Diagnosis not present

## 2021-02-02 DIAGNOSIS — E785 Hyperlipidemia, unspecified: Secondary | ICD-10-CM | POA: Diagnosis not present

## 2021-02-02 DIAGNOSIS — F17200 Nicotine dependence, unspecified, uncomplicated: Secondary | ICD-10-CM | POA: Diagnosis not present

## 2021-02-02 DIAGNOSIS — R921 Mammographic calcification found on diagnostic imaging of breast: Secondary | ICD-10-CM | POA: Diagnosis not present

## 2021-02-02 DIAGNOSIS — J449 Chronic obstructive pulmonary disease, unspecified: Secondary | ICD-10-CM | POA: Diagnosis not present

## 2021-02-02 HISTORY — PX: BREAST BIOPSY: SHX20

## 2021-02-02 HISTORY — DX: Gastro-esophageal reflux disease without esophagitis: K21.9

## 2021-02-02 HISTORY — PX: RADIOACTIVE SEED GUIDED EXCISIONAL BREAST BIOPSY: SHX6490

## 2021-02-02 SURGERY — RADIOACTIVE SEED GUIDED BREAST BIOPSY
Anesthesia: General | Site: Breast | Laterality: Left

## 2021-02-02 MED ORDER — FENTANYL CITRATE (PF) 100 MCG/2ML IJ SOLN
INTRAMUSCULAR | Status: AC
  Filled 2021-02-02: qty 2

## 2021-02-02 MED ORDER — EPHEDRINE 5 MG/ML INJ
INTRAVENOUS | Status: AC
  Filled 2021-02-02: qty 10

## 2021-02-02 MED ORDER — ACETAMINOPHEN 500 MG PO TABS
1000.0000 mg | ORAL_TABLET | ORAL | Status: AC
  Administered 2021-02-02: 1000 mg via ORAL

## 2021-02-02 MED ORDER — LIDOCAINE 2% (20 MG/ML) 5 ML SYRINGE
INTRAMUSCULAR | Status: AC
  Filled 2021-02-02: qty 5

## 2021-02-02 MED ORDER — ONDANSETRON HCL 4 MG/2ML IJ SOLN
INTRAMUSCULAR | Status: AC
  Filled 2021-02-02: qty 2

## 2021-02-02 MED ORDER — LIDOCAINE HCL (CARDIAC) PF 100 MG/5ML IV SOSY
PREFILLED_SYRINGE | INTRAVENOUS | Status: DC | PRN
  Administered 2021-02-02: 30 mg via INTRAVENOUS

## 2021-02-02 MED ORDER — MIDAZOLAM HCL 5 MG/5ML IJ SOLN
INTRAMUSCULAR | Status: DC | PRN
  Administered 2021-02-02: 2 mg via INTRAVENOUS

## 2021-02-02 MED ORDER — OXYCODONE HCL 5 MG/5ML PO SOLN: 5.0000 mg | Freq: Once | ORAL | Status: DC | PRN

## 2021-02-02 MED ORDER — CEFAZOLIN SODIUM-DEXTROSE 2-4 GM/100ML-% IV SOLN
2.0000 g | INTRAVENOUS | Status: AC
  Administered 2021-02-02: 2 g via INTRAVENOUS

## 2021-02-02 MED ORDER — AMISULPRIDE (ANTIEMETIC) 5 MG/2ML IV SOLN: 10.0000 mg | Freq: Once | INTRAVENOUS | Status: DC | PRN

## 2021-02-02 MED ORDER — DEXAMETHASONE SODIUM PHOSPHATE 4 MG/ML IJ SOLN
INTRAMUSCULAR | Status: DC | PRN
  Administered 2021-02-02: 10 mg via INTRAVENOUS

## 2021-02-02 MED ORDER — PHENYLEPHRINE HCL (PRESSORS) 10 MG/ML IV SOLN
INTRAVENOUS | Status: DC | PRN
  Administered 2021-02-02: 40 ug via INTRAVENOUS

## 2021-02-02 MED ORDER — ACETAMINOPHEN 500 MG PO TABS
ORAL_TABLET | ORAL | Status: AC
  Filled 2021-02-02: qty 2

## 2021-02-02 MED ORDER — PHENYLEPHRINE 40 MCG/ML (10ML) SYRINGE FOR IV PUSH (FOR BLOOD PRESSURE SUPPORT)
PREFILLED_SYRINGE | INTRAVENOUS | Status: AC
  Filled 2021-02-02: qty 10

## 2021-02-02 MED ORDER — TRAMADOL HCL 50 MG PO TABS: 50.0000 mg | ORAL_TABLET | Freq: Four times a day (QID) | ORAL | 1 refills | Status: DC | PRN

## 2021-02-02 MED ORDER — FENTANYL CITRATE (PF) 100 MCG/2ML IJ SOLN: 25.0000 ug | INTRAMUSCULAR | Status: DC | PRN

## 2021-02-02 MED ORDER — DEXAMETHASONE SODIUM PHOSPHATE 10 MG/ML IJ SOLN
INTRAMUSCULAR | Status: AC
  Filled 2021-02-02: qty 1

## 2021-02-02 MED ORDER — ONDANSETRON HCL 4 MG/2ML IJ SOLN
INTRAMUSCULAR | Status: DC | PRN
  Administered 2021-02-02: 4 mg via INTRAVENOUS

## 2021-02-02 MED ORDER — FENTANYL CITRATE (PF) 100 MCG/2ML IJ SOLN
INTRAMUSCULAR | Status: DC | PRN
  Administered 2021-02-02 (×2): 25 ug via INTRAVENOUS
  Administered 2021-02-02: 50 ug via INTRAVENOUS

## 2021-02-02 MED ORDER — CHLORHEXIDINE GLUCONATE CLOTH 2 % EX PADS: 6.0000 | MEDICATED_PAD | Freq: Once | CUTANEOUS | Status: DC

## 2021-02-02 MED ORDER — PROPOFOL 10 MG/ML IV BOLUS: INTRAVENOUS | Status: DC | PRN

## 2021-02-02 MED ORDER — SUCCINYLCHOLINE CHLORIDE 200 MG/10ML IV SOSY
PREFILLED_SYRINGE | INTRAVENOUS | Status: AC
  Filled 2021-02-02: qty 10

## 2021-02-02 MED ORDER — LACTATED RINGERS IV SOLN: INTRAVENOUS | Status: DC

## 2021-02-02 MED ORDER — OXYCODONE HCL 5 MG PO TABS: 5.0000 mg | ORAL_TABLET | Freq: Once | ORAL | Status: DC | PRN

## 2021-02-02 MED ORDER — PROPOFOL 10 MG/ML IV BOLUS
INTRAVENOUS | Status: DC | PRN
  Administered 2021-02-02: 90 mg via INTRAVENOUS

## 2021-02-02 MED ORDER — LIDOCAINE HCL 1 % IJ SOLN
INTRAMUSCULAR | Status: DC | PRN
  Administered 2021-02-02: 30 mL

## 2021-02-02 MED ORDER — CEFAZOLIN SODIUM-DEXTROSE 2-4 GM/100ML-% IV SOLN
INTRAVENOUS | Status: AC
  Filled 2021-02-02: qty 100

## 2021-02-02 SURGICAL SUPPLY — 55 items
ADH SKN CLS APL DERMABOND .7 (GAUZE/BANDAGES/DRESSINGS) ×1
APL PRP STRL LF DISP 70% ISPRP (MISCELLANEOUS) ×1
BINDER BREAST MEDIUM (GAUZE/BANDAGES/DRESSINGS) ×1 IMPLANT
BLADE SURG 10 STRL SS (BLADE) ×2 IMPLANT
BLADE SURG 15 STRL LF DISP TIS (BLADE) IMPLANT
BLADE SURG 15 STRL SS (BLADE)
CANISTER SUC SOCK COL 7IN (MISCELLANEOUS) IMPLANT
CANISTER SUCT 1200ML W/VALVE (MISCELLANEOUS) ×2 IMPLANT
CHLORAPREP W/TINT 26 (MISCELLANEOUS) ×2 IMPLANT
CLIP VESOCCLUDE LG 6/CT (CLIP) ×2 IMPLANT
COVER BACK TABLE 60X90IN (DRAPES) ×2 IMPLANT
COVER MAYO STAND STRL (DRAPES) ×2 IMPLANT
COVER PROBE W GEL 5X96 (DRAPES) ×2 IMPLANT
COVER WAND RF STERILE (DRAPES) IMPLANT
DECANTER SPIKE VIAL GLASS SM (MISCELLANEOUS) IMPLANT
DERMABOND ADVANCED (GAUZE/BANDAGES/DRESSINGS) ×1
DERMABOND ADVANCED .7 DNX12 (GAUZE/BANDAGES/DRESSINGS) ×1 IMPLANT
DRAPE LAPAROSCOPIC ABDOMINAL (DRAPES) ×2 IMPLANT
DRAPE UTILITY XL STRL (DRAPES) ×2 IMPLANT
ELECT COATED BLADE 2.86 ST (ELECTRODE) ×2 IMPLANT
ELECT REM PT RETURN 9FT ADLT (ELECTROSURGICAL) ×2
ELECTRODE REM PT RTRN 9FT ADLT (ELECTROSURGICAL) ×1 IMPLANT
GAUZE SPONGE 4X4 12PLY STRL LF (GAUZE/BANDAGES/DRESSINGS) ×2 IMPLANT
GLOVE SURG ENC MOIS LTX SZ6 (GLOVE) ×2 IMPLANT
GLOVE SURG ENC MOIS LTX SZ6.5 (GLOVE) ×1 IMPLANT
GLOVE SURG UNDER POLY LF SZ6.5 (GLOVE) ×1 IMPLANT
GLOVE SURG UNDER POLY LF SZ7 (GLOVE) ×2 IMPLANT
GOWN STRL REUS W/ TWL LRG LVL3 (GOWN DISPOSABLE) ×1 IMPLANT
GOWN STRL REUS W/ TWL XL LVL3 (GOWN DISPOSABLE) IMPLANT
GOWN STRL REUS W/TWL 2XL LVL3 (GOWN DISPOSABLE) ×2 IMPLANT
GOWN STRL REUS W/TWL LRG LVL3 (GOWN DISPOSABLE) ×4
GOWN STRL REUS W/TWL XL LVL3 (GOWN DISPOSABLE) ×2
KIT MARKER MARGIN INK (KITS) ×2 IMPLANT
LIGHT WAVEGUIDE WIDE FLAT (MISCELLANEOUS) IMPLANT
NDL HYPO 25X1 1.5 SAFETY (NEEDLE) ×1 IMPLANT
NEEDLE HYPO 25X1 1.5 SAFETY (NEEDLE) ×2 IMPLANT
NS IRRIG 1000ML POUR BTL (IV SOLUTION) ×2 IMPLANT
PACK BASIN DAY SURGERY FS (CUSTOM PROCEDURE TRAY) ×2 IMPLANT
PENCIL SMOKE EVACUATOR (MISCELLANEOUS) ×2 IMPLANT
SLEEVE SCD COMPRESS KNEE MED (STOCKING) ×2 IMPLANT
SPONGE LAP 18X18 RF (DISPOSABLE) ×2 IMPLANT
STAPLER VISISTAT 35W (STAPLE) IMPLANT
STRIP CLOSURE SKIN 1/2X4 (GAUZE/BANDAGES/DRESSINGS) ×2 IMPLANT
SUT MON AB 4-0 PC3 18 (SUTURE) ×2 IMPLANT
SUT SILK 2 0 SH (SUTURE) IMPLANT
SUT VIC AB 2-0 SH 18 (SUTURE) IMPLANT
SUT VIC AB 3-0 SH 27 (SUTURE) ×2
SUT VIC AB 3-0 SH 27X BRD (SUTURE) ×1 IMPLANT
SUT VICRYL 3-0 CR8 SH (SUTURE) IMPLANT
SYR BULB EAR ULCER 3OZ GRN STR (SYRINGE) ×2 IMPLANT
SYR CONTROL 10ML LL (SYRINGE) ×2 IMPLANT
TOWEL GREEN STERILE FF (TOWEL DISPOSABLE) ×2 IMPLANT
TRAY FAXITRON CT DISP (TRAY / TRAY PROCEDURE) ×2 IMPLANT
TUBE CONNECTING 20X1/4 (TUBING) ×2 IMPLANT
YANKAUER SUCT BULB TIP NO VENT (SUCTIONS) ×2 IMPLANT

## 2021-02-02 NOTE — Interval H&P Note (Signed)
History and Physical Interval Note:  02/02/2021 11:57 AM  Tracy Davidson  has presented today for surgery, with the diagnosis of ABNORMAL LEFT MAMMOGRAM.  The various methods of treatment have been discussed with the patient and family. After consideration of risks, benefits and other options for treatment, the patient has consented to  Procedure(s): RADIOACTIVE SEED GUIDED EXCISIONAL LEFT BREAST BIOPSY (Left) as a surgical intervention.  The patient's history has been reviewed, patient examined, no change in status, stable for surgery.  I have reviewed the patient's chart and labs.  Questions were answered to the patient's satisfaction.     Stark Klein

## 2021-02-02 NOTE — Transfer of Care (Signed)
Immediate Anesthesia Transfer of Care Note  Patient: Tracy Davidson  Procedure(s) Performed: RADIOACTIVE SEED GUIDED EXCISIONAL LEFT BREAST BIOPSY (Left Breast)  Patient Location: PACU  Anesthesia Type:General  Level of Consciousness: awake, drowsy and patient cooperative  Airway & Oxygen Therapy: Patient Spontanous Breathing and Patient connected to face mask oxygen  Post-op Assessment: Report given to RN and Post -op Vital signs reviewed and stable  Post vital signs: Reviewed and stable  Last Vitals:  Vitals Value Taken Time  BP    Temp    Pulse 65 02/02/21 1301  Resp    SpO2 92 % 02/02/21 1301  Vitals shown include unvalidated device data.  Last Pain:  Vitals:   02/02/21 1031  TempSrc: Oral  PainSc: 0-No pain      Patients Stated Pain Goal: 3 (30/07/62 2633)  Complications: No complications documented.

## 2021-02-02 NOTE — Anesthesia Postprocedure Evaluation (Signed)
Anesthesia Post Note  Patient: Tracy Davidson  Procedure(s) Performed: RADIOACTIVE SEED GUIDED EXCISIONAL LEFT BREAST BIOPSY (Left Breast)     Patient location during evaluation: PACU Anesthesia Type: General Level of consciousness: sedated and patient cooperative Pain management: pain level controlled Vital Signs Assessment: post-procedure vital signs reviewed and stable Respiratory status: spontaneous breathing Cardiovascular status: stable Anesthetic complications: no   No complications documented.  Last Vitals:  Vitals:   02/02/21 1330 02/02/21 1353  BP: (!) 169/80 (!) 165/83  Pulse: 75 77  Resp: 20 19  Temp:  37 C  SpO2: 100% 100%    Last Pain:  Vitals:   02/02/21 1353  TempSrc:   PainSc: 0-No pain                 Nolon Nations

## 2021-02-02 NOTE — Op Note (Signed)
left Breast Radioactive seed localized excisional biopsy  Indications: This patient presents with history of abnormal left mammogram with discordant core needle biopsy.    Pre-operative Diagnosis: abnormal left mammogram    Post-operative Diagnosis: abnormal left mammogram  Surgeon: Stark Klein   Anesthesia: General endotracheal anesthesia  ASA Class: 3  Procedure Details  The patient was seen in the Holding Room. The risks, benefits, complications, treatment options, and expected outcomes were discussed with the patient. The possibilities of bleeding, infection, the need for additional procedures, failure to diagnose a condition, and creating a complication requiring transfusion or operation were discussed with the patient. The patient concurred with the proposed plan, giving informed consent.  The site of surgery properly noted/marked. The patient was taken to Operating Room # 8, identified, and the procedure verified as left Breast seed localized excisional biopsy. A Time Out was held and the above information confirmed.  The left breast and chest were prepped and draped in standard fashion. An inferior circumareolar incision was made near the previously placed radioactive seed.  Dissection was carried down around the point of maximum signal intensity. The cautery was used to perform the dissection.   The specimen was inked with the margin marker paint kit.    Specimen radiography confirmed inclusion of the mammographic lesion, the clip, and the seed.  The background signal in the breast was zero.   Hemostasis was achieved with cautery.  The wound was irrigated and closed with 3-0 vicryl interrupted deep dermal sutures and 4-0 monocryl running subcuticular suture.      Sterile dressings were applied. At the end of the operation, all sponge, instrument, and needle counts were correct.  Findings: Seed, clip in specimen.  Anterior margin is skin  Estimated Blood Loss:  min          Specimens: left breast tissue with seed         Complications:  None; patient tolerated the procedure well.         Disposition: PACU - hemodynamically stable.         Condition: stable

## 2021-02-02 NOTE — Anesthesia Preprocedure Evaluation (Addendum)
Anesthesia Evaluation  Patient identified by MRN, date of birth, ID band Patient awake    Reviewed: Allergy & Precautions, NPO status , Patient's Chart, lab work & pertinent test results  Airway Mallampati: II  TM Distance: >3 FB Neck ROM: Full    Dental  (+) Dental Advisory Given, Missing, Poor Dentition   Pulmonary shortness of breath and with exertion, Current Smoker,    Pulmonary exam normal breath sounds clear to auscultation (-) decreased breath sounds      Cardiovascular hypertension, Pt. on medications + Peripheral Vascular Disease  Normal cardiovascular exam Rhythm:Regular Rate:Normal  EKG 01/29/20 NSR, LVH by voltage, RAD, anterior infarct, non specific ST-T depression likely strain pattern  Echo 02/02/20 1. Left ventricular ejection fraction, by estimation, is 60 to 65%. The left ventricle has normal function. The left ventricle has no regional wall motion abnormalities. There is moderate left ventricular hypertrophy.  Left ventricular diastolic parameters are indeterminate.  2. Right ventricular systolic function is normal. The right ventricular size is normal. There is normal pulmonary artery systolic pressure.  3. The mitral valve is normal in structure. No evidence of mitral valve regurgitation. No evidence of mitral stenosis.  4. The aortic valve is normal in structure. Aortic valve regurgitation is not visualized. No aortic stenosis is present.  5. The inferior vena cava is normal in size with greater than 50% respiratory variability, suggesting right atrial pressure of 3 mmHg.   Invasive Vascular Studies 03/03/20  Aortogram showed patent renal arteries bilaterally as well as a patent infrarenal aorta.  Bilateral common iliac arteries widely patent.  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  Right lower extremity runoff showed a patent common femoral, profunda, SFA, popliteal and at least two-vessel runoff via the anterior tibial and peroneal.  Left lower extremity runoff showed a patent common femoral, profunda, SFA, popliteal and apparent three-vessel runoff although little bit more sluggish which may have been from her sheath.  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  No evidence of residual stenosis.  Has preserved runoff in the right lower extremity.   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 Plavix therapy- last dose 04/06/20 Pletal therapy   Anesthesia Other Findings   Reproductive/Obstetrics                            Anesthesia Physical  Anesthesia Plan  ASA: III  Anesthesia Plan: General   Post-op Pain Management:    Induction: Intravenous  PONV Risk Score and Plan: 2 and Ondansetron, Treatment may vary due to age or medical condition, Dexamethasone and Diphenhydramine  Airway Management Planned: LMA  Additional Equipment: None  Intra-op Plan:   Post-operative Plan: Extubation in OR  Informed Consent: I have reviewed the patients History and Physical, chart, labs and discussed the procedure including the risks, benefits and alternatives for the proposed anesthesia with the patient or authorized representative who has indicated his/her understanding  and acceptance.     Dental advisory given  Plan Discussed with: CRNA  Anesthesia Plan Comments:        Anesthesia Quick Evaluation

## 2021-02-02 NOTE — Anesthesia Procedure Notes (Signed)
Procedure Name: LMA Insertion Date/Time: 02/02/2021 12:12 PM Performed by: Willa Frater, CRNA Pre-anesthesia Checklist: Patient identified, Emergency Drugs available, Suction available and Patient being monitored Patient Re-evaluated:Patient Re-evaluated prior to induction Oxygen Delivery Method: Circle system utilized Preoxygenation: Pre-oxygenation with 100% oxygen Induction Type: IV induction Ventilation: Mask ventilation without difficulty LMA: LMA inserted LMA Size: 4.0 Number of attempts: 1 Airway Equipment and Method: Bite block Placement Confirmation: positive ETCO2 Tube secured with: Tape Dental Injury: Teeth and Oropharynx as per pre-operative assessment

## 2021-02-02 NOTE — Discharge Instructions (Addendum)
[place] Office Phone Number [phone]  BREAST BIOPSY/ PARTIAL MASTECTOMY: POST OP INSTRUCTIONS  Always review your discharge instruction sheet given to you by the facility where your surgery was performed.  IF YOU HAVE DISABILITY OR FAMILY LEAVE FORMS, YOU MUST BRING THEM TO THE OFFICE FOR PROCESSING.  DO NOT GIVE THEM TO YOUR DOCTOR.  1. A prescription for pain medication may be given to you upon discharge.  Take your pain medication as prescribed, if needed.  If narcotic pain medicine is not needed, then you may take acetaminophen (Tylenol) or ibuprofen (Advil) as needed. 2. Take your usually prescribed medications unless otherwise directed 3. If you need a refill on your pain medication, please contact your pharmacy.  They will contact our office to request authorization.  Prescriptions will not be filled after 5pm or on week-ends. 4. You should eat very light the first 24 hours after surgery, such as soup, crackers, pudding, etc.  Resume your normal diet the day after surgery. 5. Most patients will experience some swelling and bruising in the breast.  Ice packs and a good support bra will help.  Swelling and bruising can take several days to resolve.  6. It is common to experience some constipation if taking pain medication after surgery.  Increasing fluid intake and taking a stool softener will usually help or prevent this problem from occurring.  A mild laxative (Milk of Magnesia or Miralax) should be taken according to package directions if there are no bowel movements after 48 hours. 7. Unless discharge instructions indicate otherwise, you may remove your bandages 48 hours after surgery, and you may shower at that time.  You may have steri-strips (small skin tapes) in place directly over the incision.  These strips should be left on the skin for 7-10 days.   Any sutures or staples will be removed at the office during your follow-up visit. 8. ACTIVITIES:  You may resume  regular daily activities (gradually increasing) beginning the next day.  Wearing a good support bra or sports bra (or the breast binder) minimizes pain and swelling.  You may have sexual intercourse when it is comfortable. a. You may drive when you no longer are taking prescription pain medication, you can comfortably wear a seatbelt, and you can safely maneuver your car and apply brakes. b. RETURN TO WORK:  __________1 week_______________ 9. You should see your doctor in the office for a follow-up appointment approximately two weeks after your surgery.  Your doctor's nurse will typically make your follow-up appointment when she calls you with your pathology report.  Expect your pathology report 2-3 business days after your surgery.  You may call to check if you do not hear from [place] after three days.   WHEN TO CALL YOUR DOCTOR: 1. Fever over 101.0 2. Nausea and/or vomiting. 3. Extreme swelling or bruising. 4. Continued bleeding from incision. 5. Increased pain, redness, or drainage from the incision.  The clinic staff is available to answer your questions during regular business hours.  Please don't hesitate to call and ask to speak to one of the nurses for clinical concerns.  If you have a medical emergency, go to the nearest emergency room or call 911.  A surgeon from [facility] is always on call at the hospital.  For further questions, please visit [email or web address]  May take Tylenol after 4:30pm, if needed.    Post Anesthesia Home Care Instructions  Activity: Get plenty of rest for the remainder of the day. A  responsible individual must stay with you for 24 hours following the procedure.  For the next 24 hours, DO NOT: -Drive a car -Paediatric nurse -Drink alcoholic beverages -Take any medication unless instructed by your physician -Make any legal decisions or sign important papers.  Meals: Start with liquid foods such as gelatin or soup. Progress to regular  foods as tolerated. Avoid greasy, spicy, heavy foods. If nausea and/or vomiting occur, drink only clear liquids until the nausea and/or vomiting subsides. Call your physician if vomiting continues.  Special Instructions/Symptoms: Your throat may feel dry or sore from the anesthesia or the breathing tube placed in your throat during surgery. If this causes discomfort, gargle with warm salt water. The discomfort should disappear within 24 hours.  If you had a scopolamine patch placed behind your ear for the management of post- operative nausea and/or vomiting:  1. The medication in the patch is effective for 72 hours, after which it should be removed.  Wrap patch in a tissue and discard in the trash. Wash hands thoroughly with soap and water. 2. You may remove the patch earlier than 72 hours if you experience unpleasant side effects which may include dry mouth, dizziness or visual disturbances. 3. Avoid touching the patch. Wash your hands with soap and water after contact with the patch.

## 2021-02-03 ENCOUNTER — Encounter (HOSPITAL_BASED_OUTPATIENT_CLINIC_OR_DEPARTMENT_OTHER): Payer: Self-pay | Admitting: General Surgery

## 2021-02-07 LAB — SURGICAL PATHOLOGY

## 2021-02-17 ENCOUNTER — Other Ambulatory Visit: Payer: Self-pay | Admitting: Vascular Surgery

## 2021-02-17 ENCOUNTER — Other Ambulatory Visit: Payer: Self-pay

## 2021-02-17 MED ORDER — ATORVASTATIN CALCIUM 10 MG PO TABS: 10.0000 mg | ORAL_TABLET | Freq: Every day | ORAL | 0 refills | Status: DC

## 2021-03-10 ENCOUNTER — Ambulatory Visit (HOSPITAL_COMMUNITY)
Admission: RE | Admit: 2021-03-10 | Discharge: 2021-03-10 | Disposition: A | Discharge status: Home / Self Care | Payer: Medicare Other | Source: Ambulatory Visit | Attending: Vascular Surgery | Admitting: Vascular Surgery

## 2021-03-10 ENCOUNTER — Ambulatory Visit (INDEPENDENT_AMBULATORY_CARE_PROVIDER_SITE_OTHER)
Admission: RE | Admit: 2021-03-10 | Discharge: 2021-03-10 | Disposition: A | Discharge status: Home / Self Care | Payer: Medicare Other | Source: Ambulatory Visit | Attending: Vascular Surgery | Admitting: Vascular Surgery

## 2021-03-10 ENCOUNTER — Encounter (HOSPITAL_COMMUNITY): Payer: Medicare Other

## 2021-03-10 ENCOUNTER — Other Ambulatory Visit: Payer: Self-pay

## 2021-03-10 DIAGNOSIS — I739 Peripheral vascular disease, unspecified: Secondary | ICD-10-CM | POA: Diagnosis not present

## 2021-03-15 ENCOUNTER — Ambulatory Visit: Payer: Medicare Other | Admitting: Vascular Surgery

## 2021-03-15 ENCOUNTER — Encounter: Payer: Self-pay | Admitting: Vascular Surgery

## 2021-03-15 ENCOUNTER — Other Ambulatory Visit: Payer: Self-pay

## 2021-03-15 VITALS — BP 135/80 | HR 85 | Temp 98.4°F | Ht 63.0 in | Wt 122.9 lb

## 2021-03-15 DIAGNOSIS — I739 Peripheral vascular disease, unspecified: Secondary | ICD-10-CM | POA: Diagnosis not present

## 2021-03-15 NOTE — Progress Notes (Signed)
Patient name: Tracy Davidson MRN: [id number] DOB: Jan 10, 1941 Sex: female  REASON FOR CONSULT: six month follow-up after right external iliac artery stent  HPI: Tracy Davidson is a 80 y.o. female, with history of hypertension, dyslipidemia, and tobacco abuse that presents for six month follow-up after right external iliac stent.  She had a right external iliac angioplasty and stent with a 7 mm x 60 mm Innova on 03/03/2020.  This was in the setting of vague right leg pain with calf and thigh cramping.  I initially sent her back to the PCP to be reevaluated and she was then sent back to me for further evaluation after no further etiology could be discovered.    She feels her right leg is about the same today.  She has no new complaints.  She is still smoking a pack a day.    Past Medical History:  Diagnosis Date  . ALLERGIC RHINITIS 09/20/2009  . Allergy   . Arthritis    LEGS and left arm  . Bladder tumor   . Cataract    both eyes, right worse thsn left  . COLONIC POLYPS, HX OF 06/06/2007  . DYSLIPIDEMIA 09/20/2009  . Dyspnea    sob with activity  . GERD (gastroesophageal reflux disease)   . HYPERTENSION 06/06/2007  . Knee pain    stiff and pain on side  . LIVER FUNCTION TESTS, ABNORMAL, HX OF    feb or march 2019  . Peripheral vascular disease ([place])   . Shingles 11/2019   left arm and shoulder    Past Surgical History:  Procedure Laterality Date  . ABDOMINAL AORTOGRAM W/LOWER EXTREMITY Bilateral 03/03/2020   Procedure: ABDOMINAL AORTOGRAM W/LOWER EXTREMITY;  Surgeon: Marty Heck, MD;  Location: [facility];  Service: Cardiovascular;  Laterality: Bilateral;  . BREAST EXCISIONAL BIOPSY Left 1975  . COLONOSCOPY  ?2015  . CYSTOSCOPY WITH BIOPSY N/A 04/12/2020   Procedure: CYSTOSCOPY WITH BIOPSY  INSTILLATION OF GEMCITABINE;  Surgeon: Lucas Mallow, MD;  Location: [facility];  Service: Urology;  Laterality: N/A;  . DILATION AND CURETTAGE OF  UTERUS  yrs ago  . PERIPHERAL VASCULAR INTERVENTION Right 03/03/2020   Procedure: PERIPHERAL VASCULAR INTERVENTION;  Surgeon: Marty Heck, MD;  Location: [facility];  Service: Cardiovascular;  Laterality: Right;  . RADIOACTIVE SEED GUIDED EXCISIONAL BREAST BIOPSY Left 02/02/2021   Procedure: RADIOACTIVE SEED GUIDED EXCISIONAL LEFT BREAST BIOPSY;  Surgeon: Stark Klein, MD;  Location: [facility];  Service: General;  Laterality: Left;  . TONSILLECTOMY  1963  . TOTAL ABDOMINAL HYSTERECTOMY  yrs ago   complete  . TRANSURETHRAL RESECTION OF BLADDER TUMOR N/A 09/18/2018   Procedure: RESTAGE TRANSURETHRAL RESECTION OF BLADDER TUMOR (TURBT);  Surgeon: Lucas Mallow, MD;  Location: [facility];  Service: Urology;  Laterality: N/A;  . TRANSURETHRAL RESECTION OF BLADDER TUMOR WITH MITOMYCIN-C N/A 08/16/2018   Procedure: TRANSURETHRAL RESECTION OF BLADDER TUMOR;  Surgeon: Lucas Mallow, MD;  Location: [facility];  Service: Urology;  Laterality: N/A;    Family History  Problem Relation Age of Onset  . Colon cancer Neg Hx   . Colon polyps Neg Hx   . Esophageal cancer Neg Hx   . Rectal cancer Neg Hx   . Stomach cancer Neg Hx   . Pancreatic cancer Neg Hx   . Breast cancer Neg Hx     SOCIAL HISTORY: Social History  Socioeconomic History  . Marital status: Single    Spouse name: Not on file  . Number of children: 0  . Years of education: 6 years college  . Highest education level: Master's degree (e.g., MA, MS, MEng, MEd, MSW, MBA)  Occupational History  . Occupation: retired  Tobacco Use  . Smoking status: Current Every Day Smoker    Packs/day: 1.00    Years: 50.00    Pack years: 50.00    Types: Cigarettes  . Smokeless tobacco: Never Used  Vaping Use  . Vaping Use: Never used  Substance and Sexual Activity  . Alcohol use: Not Currently    Alcohol/week: 2.0 standard drinks    Types: 2 Shots of liquor per week     Comment: daily alcohol  . Drug use: No  . Sexual activity: Not on file  Other Topics Concern  . Not on file  Social History Narrative  . Not on file   Social Determinants of Health   Financial Resource Strain: Low Risk   . Difficulty of Paying Living Expenses: Not hard at all  Food Insecurity: No Food Insecurity  . Worried About Charity fundraiser in the Last Year: Never true  . Ran Out of Food in the Last Year: Never true  Transportation Needs: No Transportation Needs  . Lack of Transportation (Medical): No  . Lack of Transportation (Non-Medical): No  Physical Activity: Inactive  . Days of Exercise per Week: 0 days  . Minutes of Exercise per Session: 0 min  Stress: No Stress Concern Present  . Feeling of Stress : Not at all  Social Connections: Moderately Integrated  . Frequency of Communication with Friends and Family: More than three times a week  . Frequency of Social Gatherings with Friends and Family: Twice a week  . Attends Religious Services: More than 4 times per year  . Active Member of Clubs or Organizations: Yes  . Attends Archivist Meetings: More than 4 times per year  . Marital Status: Divorced  Human resources officer Violence: Not At Risk  . Fear of Current or Ex-Partner: No  . Emotionally Abused: No  . Physically Abused: No  . Sexually Abused: No    Allergies  Allergen Reactions  . Doxycycline Palpitations    Current Outpatient Medications  Medication Sig Dispense Refill  . aspirin EC 81 MG tablet Take 1 tablet (81 mg total) by mouth daily. 30 tablet 11  . atorvastatin (LIPITOR) 10 MG tablet Take 1 tablet (10 mg total) by mouth daily. 30 tablet 0  . calcium carbonate (OSCAL) 1500 (600 Ca) MG TABS tablet Take 1,200 mg by mouth 2 (two) times daily with a meal.     . capsicum (ZOSTRIX) 0.075 % topical cream Apply thin film on knee 3-4 times per day as needed 28.3 g 0  . Cholecalciferol (VITAMIN D) 125 MCG (5000 UT) CAPS Take 5,000 Units by mouth  daily.     Marland Kitchen KLOR-CON M20 20 MEQ tablet TAKE 1 TABLET BY MOUTH EVERY DAY 90 tablet 2  . lisinopril-hydrochlorothiazide (ZESTORETIC) 20-25 MG tablet TAKE 1 TABLET BY MOUTH EVERY DAY 90 tablet 0  . omeprazole (PRILOSEC) 20 MG capsule TAKE 1 CAPSULE BY MOUTH EVERY DAY 90 capsule 1  . traMADol (ULTRAM) 50 MG tablet Take 1 tablet (50 mg total) by mouth every 6 (six) hours as needed for moderate pain or severe pain. (Patient not taking: Reported on 03/15/2021) 15 tablet 1   No current facility-administered medications for this  visit.    REVIEW OF SYSTEMS:  [X]  denotes positive finding, [ ]  denotes negative finding Cardiac  Comments:  Chest pain or chest pressure:    Shortness of breath upon exertion:    Short of breath when lying flat:    Irregular heart rhythm:        Vascular    Pain in calf, thigh, or hip brought on by ambulation:    Pain in feet at night that wakes you up from your sleep:     Blood clot in your veins:    Leg swelling:         Pulmonary    Oxygen at home:    Productive cough:     Wheezing:         Neurologic    Sudden weakness in arms or legs:     Sudden numbness in arms or legs:     Sudden onset of difficulty speaking or slurred speech:    Temporary loss of vision in one eye:     Problems with dizziness:         Gastrointestinal    Blood in stool:     Vomited blood:         Genitourinary    Burning when urinating:     Blood in urine:        Psychiatric    Major depression:         Hematologic    Bleeding problems:    Problems with blood clotting too easily:        Skin    Rashes or ulcers:        Constitutional    Fever or chills:      PHYSICAL EXAM: Vitals:   03/15/21 1436  BP: 135/80  Pulse: 85  Temp: 98.4 F (36.9 C)  TempSrc: Skin  SpO2: 96%  Weight: 122 lb 14.4 oz (55.7 kg)  Height: 5\' 3"  (1.6 m)    GENERAL: The patient is a well-nourished female, in no acute distress. The vital signs are documented above. CARDIAC: There is a  regular rate and rhythm.  VASCULAR:  Palpable femoral pulses bilaterally Right DP palpable No lower extremity tissue loss PULMONARY: No respiratory distress. ABDOMEN: Soft and non-tender. MUSCULOSKELETAL: There are no major deformities or cyanosis.  DATA:   Aortoiliac duplex shows a widely patent right external iliac stent  ABIs are 1.04 on the right triphasic and 1.13 on the left triphasic  Assessment/Plan:  80 year old female now status post right external iliac artery angioplasty with stent placement on 03/03/20.  Her right external iliac stent looks widely patent.  She has normal ABI at the ankle.  She has a palpable dorsalis pedis pulse in the right foot.  She is still having these vague symptoms in the right leg and I discussed this is not related to arterial insufficiency given now normal exam with a patent stent.  I discussed the importance of smoking cessation.  I will see her again in 1 year in the PA clinic for surveillance with noninvasive imaging.   Marty Heck, MD [facility] Office: [place]

## 2021-03-17 ENCOUNTER — Other Ambulatory Visit: Payer: Self-pay

## 2021-03-17 ENCOUNTER — Other Ambulatory Visit: Payer: Self-pay | Admitting: Vascular Surgery

## 2021-03-17 MED ORDER — ATORVASTATIN CALCIUM 10 MG PO TABS: 10.0000 mg | ORAL_TABLET | Freq: Every day | ORAL | 3 refills | Status: DC

## 2021-03-29 ENCOUNTER — Other Ambulatory Visit: Payer: Self-pay

## 2021-03-30 ENCOUNTER — Ambulatory Visit (INDEPENDENT_AMBULATORY_CARE_PROVIDER_SITE_OTHER): Payer: Medicare Other | Admitting: Family Medicine

## 2021-03-30 ENCOUNTER — Encounter: Payer: Self-pay | Admitting: Family Medicine

## 2021-03-30 VITALS — BP 124/76 | HR 94 | Temp 98.5°F | Wt 123.6 lb

## 2021-03-30 DIAGNOSIS — L209 Atopic dermatitis, unspecified: Secondary | ICD-10-CM

## 2021-03-30 DIAGNOSIS — G4700 Insomnia, unspecified: Secondary | ICD-10-CM

## 2021-03-30 DIAGNOSIS — I739 Peripheral vascular disease, unspecified: Secondary | ICD-10-CM

## 2021-03-30 DIAGNOSIS — R600 Localized edema: Secondary | ICD-10-CM | POA: Diagnosis not present

## 2021-03-30 MED ORDER — TRIAMCINOLONE ACETONIDE 0.1 % EX CREA: 1.0000 "application " | TOPICAL_CREAM | Freq: Two times a day (BID) | CUTANEOUS | 0 refills | Status: DC

## 2021-03-30 NOTE — Progress Notes (Signed)
Subjective:    Patient ID: Tracy Davidson, female    DOB: [birth year], 80 y.o.   MRN: [id number]  Chief Complaint  Patient presents with   Rash    Rash on both shoulders and both sides of neck, and on face. Uses coco butter cream on face and lotion and vaseline on neck and body. States does not hurt just itches     HPI Patient was seen today for several concerns.  PT with LE edema that started today.  Pt notes sitting more than normal.  Does not always elevate her legs.  Denies increased sodium intake.  Patient also notes rash on bilateral neck and left posterior forearm x2-3 weeks.  Areas are pruritic and dry.  Patient using a lotion and baby oil on skin.  Denies taking hot showers, changes in soaps, lotions, or detergents, or any insect bites.  Denies rash on other areas of body.  Pt endorses insomnia.  Tries to go to bed around midnight, 1230, or 1 AM.  Notes laying in bed watching the news before trying to go to sleep.  Patient denies drinking caffeine late in the evening.  Past Medical History:  Diagnosis Date   ALLERGIC RHINITIS 09/20/2009   Allergy    Arthritis    LEGS and left arm   Bladder tumor    Cataract    both eyes, right worse thsn left   COLONIC POLYPS, HX OF 06/06/2007   DYSLIPIDEMIA 09/20/2009   Dyspnea    sob with activity   GERD (gastroesophageal reflux disease)    HYPERTENSION 06/06/2007   Knee pain    stiff and pain on side   LIVER FUNCTION TESTS, ABNORMAL, HX OF    feb or march 2019   Peripheral vascular disease ([place])    Shingles 11/2019   left arm and shoulder    Allergies  Allergen Reactions   Doxycycline Palpitations   Family History  Problem Relation Age of Onset   Colon cancer Neg Hx    Colon polyps Neg Hx    Esophageal cancer Neg Hx    Rectal cancer Neg Hx    Stomach cancer Neg Hx    Pancreatic cancer Neg Hx    Breast cancer Neg Hx      ROS General: Denies fever, chills, night sweats, changes in weight, changes in appetite  +insomnia HEENT: Denies headaches, ear pain, changes in vision, rhinorrhea, sore throat CV: Denies CP, palpitations, SOB, orthopnea  +LE edema Pulm: Denies SOB, cough, wheezing GI: Denies abdominal pain, nausea, vomiting, diarrhea, constipation GU: Denies dysuria, hematuria, frequency, vaginal discharge Msk: Denies muscle cramps, joint pains Neuro: Denies weakness, numbness, tingling Skin: Denies rashes, bruising +rash Psych: Denies depression, anxiety, hallucinations    Objective:    Blood pressure 124/76, pulse 94, temperature 98.5 F (36.9 C), temperature source Oral, weight 123 lb 9.6 oz (56.1 kg), SpO2 97 %.  Gen. Pleasant, well-nourished, in no distress, normal affect   HEENT: [place]/AT, face symmetric, conjunctiva clear, no scleral icterus, PERRLA, EOMI, nares patent without drainage Lungs: no accessory muscle use, CTAB, no wheezes or rales Cardiovascular: RRR, no m/r/g, trace edema in b/l LEs to shin Musculoskeletal: No deformities, no cyanosis or clubbing, normal tone Neuro:  A&Ox3, CN II-XII intact, normal gait Skin:  Warm, no lesions/ rash   Wt Readings from Last 3 Encounters:  03/30/21 123 lb 9.6 oz (56.1 kg)  03/15/21 122 lb 14.4 oz (55.7 kg)  02/02/21 122 lb 5.7 oz (55.5 kg)  Lab Results  Component Value Date   WBC 6.1 03/11/2020   HGB 16.3 (H) 04/12/2020   HCT 48.0 (H) 04/12/2020   PLT 186.0 03/11/2020   GLUCOSE 91 01/31/2021   CHOL 199 01/29/2020   TRIG 60.0 01/29/2020   HDL 98.90 01/29/2020   LDLDIRECT 143.2 03/14/2011   LDLCALC 88 01/29/2020   ALT 22 10/24/2017   AST 35 10/24/2017   NA 140 01/31/2021   K 4.6 01/31/2021   CL 99 01/31/2021   CREATININE 0.73 01/31/2021   BUN 8 01/31/2021   CO2 32 01/31/2021   TSH 2.35 01/29/2020   HGBA1C 4.5 (L) 01/29/2020    Assessment/Plan:  Atopic dermatitis, unspecified type  - Plan: triamcinolone cream (KENALOG) 0.1 %  Pedal edema -discussed supportive care such as TED hose, compression socks,  elevation -lifestyle modifications such as decreased sodium intake -for continue symptoms consider lasix  PAD (peripheral artery disease) (HCC) -supportive care -f/u with Vascular Surg  Insomnia, unspecified type -sleep hygiene  -continue to monitor  F/u prn  [name], MD

## 2021-03-30 NOTE — Patient Instructions (Signed)
You can use Melatonin 1-3 mg nightly to help you with sleep.  It can be found over-the-counter at your local drugstore.  Make sure you are elevating your feet when you are sitting at home.

## 2021-03-31 DIAGNOSIS — C678 Malignant neoplasm of overlapping sites of bladder: Secondary | ICD-10-CM | POA: Diagnosis not present

## 2021-04-06 ENCOUNTER — Encounter: Payer: Self-pay | Admitting: Family Medicine

## 2021-04-08 ENCOUNTER — Other Ambulatory Visit: Payer: Self-pay | Admitting: Family Medicine

## 2021-04-08 ENCOUNTER — Telehealth: Payer: Self-pay | Admitting: Family Medicine

## 2021-04-08 NOTE — Telephone Encounter (Signed)
Refill sent.

## 2021-04-08 NOTE — Telephone Encounter (Signed)
KLOR-CON M20 20 MEQ tablet  [id number] [address], [address] Phone:  [phone]  Fax:  [phone]

## 2021-04-23 ENCOUNTER — Other Ambulatory Visit: Payer: Self-pay | Admitting: Family Medicine

## 2021-04-23 DIAGNOSIS — I10 Essential (primary) hypertension: Secondary | ICD-10-CM

## 2021-05-02 ENCOUNTER — Telehealth: Payer: Self-pay | Admitting: Family Medicine

## 2021-05-02 NOTE — Telephone Encounter (Signed)
Pt received a cream from Dr. Volanda Napoleon last month that she has been using for her Eczema, however the cream doesn't help her.  She is requesting a call back.

## 2021-05-05 NOTE — Telephone Encounter (Signed)
Appt needed?

## 2021-05-06 NOTE — Telephone Encounter (Signed)
Appt scheduled

## 2021-05-10 DIAGNOSIS — C678 Malignant neoplasm of overlapping sites of bladder: Secondary | ICD-10-CM | POA: Diagnosis not present

## 2021-05-10 DIAGNOSIS — Z5111 Encounter for antineoplastic chemotherapy: Secondary | ICD-10-CM | POA: Diagnosis not present

## 2021-05-11 ENCOUNTER — Other Ambulatory Visit: Payer: Self-pay | Admitting: Family Medicine

## 2021-05-11 ENCOUNTER — Other Ambulatory Visit: Payer: Self-pay

## 2021-05-11 DIAGNOSIS — K219 Gastro-esophageal reflux disease without esophagitis: Secondary | ICD-10-CM

## 2021-05-12 ENCOUNTER — Encounter: Payer: Self-pay | Admitting: Family Medicine

## 2021-05-12 ENCOUNTER — Ambulatory Visit (INDEPENDENT_AMBULATORY_CARE_PROVIDER_SITE_OTHER): Payer: Medicare Other | Admitting: Family Medicine

## 2021-05-12 VITALS — BP 138/72 | HR 91 | Temp 98.3°F | Wt 123.8 lb

## 2021-05-12 DIAGNOSIS — R600 Localized edema: Secondary | ICD-10-CM

## 2021-05-12 DIAGNOSIS — L308 Other specified dermatitis: Secondary | ICD-10-CM | POA: Diagnosis not present

## 2021-05-12 DIAGNOSIS — I739 Peripheral vascular disease, unspecified: Secondary | ICD-10-CM | POA: Diagnosis not present

## 2021-05-12 DIAGNOSIS — M25473 Effusion, unspecified ankle: Secondary | ICD-10-CM

## 2021-05-12 LAB — CBC WITH DIFFERENTIAL/PLATELET
Basophils Absolute: 0 10*3/uL (ref 0.0–0.1)
Basophils Relative: 0.5 % (ref 0.0–3.0)
Eosinophils Absolute: 0.5 10*3/uL (ref 0.0–0.7)
Eosinophils Relative: 5.9 % — ABNORMAL HIGH (ref 0.0–5.0)
HCT: 36.1 % (ref 36.0–46.0)
Hemoglobin: 12.1 g/dL (ref 12.0–15.0)
Lymphocytes Relative: 11.2 % — ABNORMAL LOW (ref 12.0–46.0)
Lymphs Abs: 1 10*3/uL (ref 0.7–4.0)
MCHC: 33.6 g/dL (ref 30.0–36.0)
MCV: 97.5 fl (ref 78.0–100.0)
Monocytes Absolute: 0.5 10*3/uL (ref 0.1–1.0)
Monocytes Relative: 5.3 % (ref 3.0–12.0)
Neutro Abs: 7 10*3/uL (ref 1.4–7.7)
Neutrophils Relative %: 77.1 % — ABNORMAL HIGH (ref 43.0–77.0)
Platelets: 162 10*3/uL (ref 150.0–400.0)
RBC: 3.7 Mil/uL — ABNORMAL LOW (ref 3.87–5.11)
RDW: 14.2 % (ref 11.5–15.5)
WBC: 9.1 10*3/uL (ref 4.0–10.5)

## 2021-05-12 LAB — BASIC METABOLIC PANEL
BUN: 10 mg/dL (ref 6–23)
CO2: 30 mEq/L (ref 19–32)
Calcium: 10.3 mg/dL (ref 8.4–10.5)
Chloride: 100 mEq/L (ref 96–112)
Creatinine, Ser: 0.75 mg/dL (ref 0.40–1.20)
GFR: 75.03 mL/min (ref >60.00)
Glucose, Bld: 79 mg/dL (ref 70–99)
Potassium: 3.7 mEq/L (ref 3.5–5.1)
Sodium: 141 mEq/L (ref 135–145)

## 2021-05-12 LAB — URIC ACID: Uric Acid, Serum: 5.3 mg/dL (ref 2.4–7.0)

## 2021-05-12 MED ORDER — TRIAMCINOLONE ACETONIDE 0.5 % EX OINT: 1.0000 "application " | TOPICAL_OINTMENT | Freq: Two times a day (BID) | CUTANEOUS | 0 refills | Status: DC

## 2021-05-12 MED ORDER — FUROSEMIDE 20 MG PO TABS
20.0000 mg | ORAL_TABLET | Freq: Every day | ORAL | 0 refills | Status: DC
Start: 2021-05-12 — End: 2021-06-21

## 2021-05-12 NOTE — Progress Notes (Signed)
Subjective:    Patient ID: Tracy Davidson, female    DOB: September 26, 1941, 80 y.o.   MRN: [id number]  Chief Complaint  Patient presents with   Follow-up    eczema    HPI Patient was seen today for acute concern.  Patient endorses edema in right ankle and foot.  Symptoms started last week after eating homemade chili.  Patient also notes eating subs the other week.  L ankle also became swollen but has resolved.  Patient denies SOB, pain in calf, edema/tightness in calf.  Taking lisinopril-hydrochlorothiazide and Klor-Con 20 mEq daily.  Has not been elevating LEs.  Followed by vascular for history of PVD s/p R external iliac stent.  Patient requesting more cream for eczema.  Endorses pruritic dry skin on UEs and  shoulders.  Using cocoa butter and baby oil.  Not taking hot showers.  Past Medical History:  Diagnosis Date   ALLERGIC RHINITIS 09/20/2009   Allergy    Arthritis    LEGS and left arm   Bladder tumor    Cataract    both eyes, right worse thsn left   COLONIC POLYPS, HX OF 06/06/2007   DYSLIPIDEMIA 09/20/2009   Dyspnea    sob with activity   GERD (gastroesophageal reflux disease)    HYPERTENSION 06/06/2007   Knee pain    stiff and pain on side   LIVER FUNCTION TESTS, ABNORMAL, HX OF    feb or march 2019   Peripheral vascular disease ([place])    Shingles 11/2019   left arm and shoulder    Allergies  Allergen Reactions   Doxycycline Palpitations    ROS General: Denies fever, chills, night sweats, changes in weight, changes in appetite HEENT: Denies headaches, ear pain, changes in vision, rhinorrhea, sore throat CV: Denies CP, palpitations, SOB, orthopnea +R ankle and foot edema Pulm: Denies SOB, cough, wheezing GI: Denies abdominal pain, nausea, vomiting, diarrhea, constipation GU: Denies dysuria, hematuria, frequency, vaginal discharge Msk: Denies muscle cramps, joint pains Neuro: Denies weakness, numbness, tingling Skin: Denies rashes, bruising  +dry, pruritic skin Psych:  Denies depression, anxiety, hallucinations    Objective:    Blood pressure 138/72, pulse 91, temperature 98.3 F (36.8 C), temperature source Oral, weight 123 lb 12.8 oz (56.2 kg), SpO2 90 %.  Gen. Pleasant, well-nourished, in no distress, normal affect   HEENT: [place]/AT, face symmetric, conjunctiva clear, no scleral icterus, PERRLA, EOMI, nares patent without drainage Lungs: no accessory muscle use Cardiovascular: RRR, no peripheral edema Musculoskeletal: Trace right ankle and pedal edema.  No calf tenderness or erythema.  No cyanosis or clubbing, normal tone Neuro:  A&Ox3, CN II-XII intact, normal gait Skin:  Warm, dry, intact.  Bilateral UEs and shoulders with hyperpigmentation, dry patches with excoriation  Wt Readings from Last 3 Encounters:  05/12/21 123 lb 12.8 oz (56.2 kg)  03/30/21 123 lb 9.6 oz (56.1 kg)  03/15/21 122 lb 14.4 oz (55.7 kg)    Lab Results  Component Value Date   WBC 6.1 03/11/2020   HGB 16.3 (H) 04/12/2020   HCT 48.0 (H) 04/12/2020   PLT 186.0 03/11/2020   GLUCOSE 91 01/31/2021   CHOL 199 01/29/2020   TRIG 60.0 01/29/2020   HDL 98.90 01/29/2020   LDLDIRECT 143.2 03/14/2011   LDLCALC 88 01/29/2020   ALT 22 10/24/2017   AST 35 10/24/2017   NA 140 01/31/2021   K 4.6 01/31/2021   CL 99 01/31/2021   CREATININE 0.73 01/31/2021   BUN 8 01/31/2021   CO2  32 01/31/2021   TSH 2.35 01/29/2020   HGBA1C 4.5 (L) 01/29/2020    Assessment/Plan:  Other eczema  -Discussed changing lotion to a thicker unscented formula such as Aveeno, Eucerin -Avoid hot showers -Kenalog ointment as needed - Plan: triamcinolone ointment (KENALOG) 0.5 %  Ankle edema -Likely 2/2 PAD and increased sodium intake.  Less likely DVT but will obtain D-dimer. -Discussed the importance of supportive care including elevating LEs when sitting and sodium reduction -Start short course Lasix 20 mg daily x2 days. -Continue other meds -Vascular ultrasound if needed based on labs and any  worsening symptoms - Plan: furosemide (LASIX) 20 MG tablet, D-dimer, Quantitative, CBC with Differential/Platelet, Uric Acid, Basic metabolic panel  Pedal edema -Ongoing issue for -Supportive care discussed - Plan: furosemide (LASIX) 20 MG tablet, D-dimer, Quantitative, Uric Acid, Basic metabolic panel  PAD (peripheral artery disease) (HCC) -s/p right external iliac stent on 03/03/2020 -Stable -Continue aspirin and Lipitor -Discussed the importance of supportive care including elevating LEs, compression socks -Continue follow-up with vascular, Dr. Carlis Abbott  F/u as needed in 1 week for continued or worsening symptoms  [name], MD

## 2021-05-13 ENCOUNTER — Encounter (HOSPITAL_COMMUNITY): Payer: Medicare Other

## 2021-05-13 ENCOUNTER — Other Ambulatory Visit: Payer: Self-pay | Admitting: Family Medicine

## 2021-05-13 ENCOUNTER — Telehealth: Payer: Self-pay

## 2021-05-13 ENCOUNTER — Ambulatory Visit (HOSPITAL_BASED_OUTPATIENT_CLINIC_OR_DEPARTMENT_OTHER): Payer: Medicare Other

## 2021-05-13 DIAGNOSIS — M25471 Effusion, right ankle: Secondary | ICD-10-CM

## 2021-05-13 DIAGNOSIS — R600 Localized edema: Secondary | ICD-10-CM

## 2021-05-13 DIAGNOSIS — R7989 Other specified abnormal findings of blood chemistry: Secondary | ICD-10-CM

## 2021-05-13 DIAGNOSIS — I739 Peripheral vascular disease, unspecified: Secondary | ICD-10-CM

## 2021-05-13 LAB — D-DIMER, QUANTITATIVE: D-Dimer, Quant: 1.98 ug{FEU}/mL — ABNORMAL HIGH

## 2021-05-13 NOTE — Telephone Encounter (Signed)
Spoke with pt @ 10:02am informing her about her CT and [place] that have been scheduled for today. [place] was scheduled for 7/29 @ 11 am, pt stated should not be able to make that appointment, informed her of the CT scheduled for 3:30 pm, she advised she would not be able to make that appointment either. Pt stated she is not driving at the time and had not transportation to get to appointments, gave her phone numbers to call and reschedule, also advised provider wanted them done as soon as possible. Patient said okay.

## 2021-05-17 DIAGNOSIS — C678 Malignant neoplasm of overlapping sites of bladder: Secondary | ICD-10-CM | POA: Diagnosis not present

## 2021-05-17 DIAGNOSIS — Z5111 Encounter for antineoplastic chemotherapy: Secondary | ICD-10-CM | POA: Diagnosis not present

## 2021-05-18 ENCOUNTER — Encounter (HOSPITAL_BASED_OUTPATIENT_CLINIC_OR_DEPARTMENT_OTHER): Payer: Self-pay | Admitting: Radiology

## 2021-05-18 ENCOUNTER — Ambulatory Visit (HOSPITAL_BASED_OUTPATIENT_CLINIC_OR_DEPARTMENT_OTHER)
Admission: RE | Admit: 2021-05-18 | Discharge: 2021-05-18 | Disposition: A | Discharge status: Home / Self Care | Payer: Medicare Other | Source: Ambulatory Visit | Attending: Family Medicine | Admitting: Family Medicine

## 2021-05-18 ENCOUNTER — Telehealth (INDEPENDENT_AMBULATORY_CARE_PROVIDER_SITE_OTHER): Payer: Medicare Other | Admitting: Family Medicine

## 2021-05-18 ENCOUNTER — Telehealth: Payer: Self-pay | Admitting: Family Medicine

## 2021-05-18 ENCOUNTER — Other Ambulatory Visit: Payer: Self-pay

## 2021-05-18 VITALS — Ht 63.0 in

## 2021-05-18 DIAGNOSIS — I739 Peripheral vascular disease, unspecified: Secondary | ICD-10-CM | POA: Insufficient documentation

## 2021-05-18 DIAGNOSIS — C342 Malignant neoplasm of middle lobe, bronchus or lung: Secondary | ICD-10-CM

## 2021-05-18 DIAGNOSIS — R7989 Other specified abnormal findings of blood chemistry: Secondary | ICD-10-CM

## 2021-05-18 DIAGNOSIS — I2699 Other pulmonary embolism without acute cor pulmonale: Secondary | ICD-10-CM | POA: Diagnosis not present

## 2021-05-18 MED ORDER — IOHEXOL 350 MG/ML SOLN
75.0000 mL | Freq: Once | INTRAVENOUS | Status: AC | PRN
  Administered 2021-05-18: 60 mL via INTRAVENOUS

## 2021-05-18 NOTE — Telephone Encounter (Signed)
I spoke with patient, appointment made for this afternoon.

## 2021-05-18 NOTE — Progress Notes (Signed)
Virtual Visit via Telephone Note I connected with Tracy Davidson on 05/18/21 at  4:30 PM EDT by telephone and verified that I am speaking with the correct person using two identifiers.   I discussed the limitations, risks, security and privacy concerns of performing an evaluation and management service by telephone and the availability of in person appointments. I also discussed with the patient that there may be a patient responsible charge related to this service. The patient expressed understanding and agreed to proceed.  Location patient: home Location provider: work office Participants present for the call: patient, provider Patient did not have a visit in the prior 7 days to address this/these issue(s).  Chief Complaint  Patient presents with   Results   History of Present Illness: Tracy Davidson is a 80 y.o.female with hx of hypertension, bladder cancer, PAD, and tobacco use disorder who had a chest CT done today, abnormal. Chest CTA ordered on 05/13/2021 because of elevated D-dimer.  She canceled procedure and rescheduled for today. She was evaluated by Dr Volanda Napoleon on 05/12/2021 because left ankle/pedal edema. She states that edema has improved.  Extremity pain for 3-4 years, attributed to "arthritis." She denies CP, dyspnea, palpitations, or diaphoresis. Nonproductive cough, which she attributes to smoking. Negative for fever,chills,abnormal wt loss,wheezing or hemoptysis.  No hx of trauma. No history of recent infection or CKD. She has not noted cyanosis.  Lab Results  Component Value Date   DDIMER 1.98 (H) 05/12/2021  [facility] CT 05/18/21 1. Negative examination for pulmonary embolism. 2. There is a perihilar mass of the right middle lobe measuring 2.6 x 2.5 cm, which closely abuts the pleura and pericardium. This lesion is consistent with primary lung malignancy, and obstructs the middle lobe bronchi, with bronchial impaction and volume loss of the right middle lobe. 3. There  are multiple small, predominantly ground-glass nodules throughout the right middle lobe, likely postobstructive airspace disease. 4. Additional small nonspecific nodules of the right middle and right lower lobes about the right major fissure measuring 0.7 and 0.6 cm. 5. No lymphadenopathy. 6. Emphysema. 7. Coronary artery disease.  She has an appt on 05/20/21 for LE venous US.  Observations/Objective: Patient sounds cheerful and well on the phone. I do not appreciate any SOB. Speech and thought processing are grossly intact. Patient reported vitals:Ht 5\' 3"  (1.6 m)   BMI 21.93 kg/m  Wt Readings from Last 3 Encounters:  05/12/21 123 lb 12.8 oz (56.2 kg)  03/30/21 123 lb 9.6 oz (56.1 kg)  03/15/21 122 lb 14.4 oz (55.7 kg)   Assessment and Plan:  1. Malignant neoplasm of middle lobe of right lung [facility]) We discussed chest CTA report. Explained that findings are concerning for malignancy. States that she is feeling well and does not want appt until her bladder cancer treatment is completed, which will be next Tuesday,BCG irrigations. I recommend cardiothoracic surgeon evaluation. Instructed about warning signs.  - Ambulatory referral to Cardiothoracic Surgery - Ambulatory referral to Hematology / Oncology  2. Elevated d-dimer Keep appointment for LE venous US. D-dimer elevation could be related with malignancy.  Follow Up Instructions:  Return if symptoms worsen or fail to improve, for Keep next appointment with PCP..  I did not refer this patient for an OV in the next 24 hours for this/these issue(s).  I discussed the assessment and treatment plan with the patient. The patient was provided an opportunity to ask questions and all were answered. The patient agreed with the plan and demonstrated an  understanding of the instructions.   The patient was advised to call back or seek an in-person evaluation if the symptoms worsen or if the condition fails to improve as anticipated.  I  provided 17 minutes of non-face-to-face time during this encounter.  Betty Martinique, MD

## 2021-05-18 NOTE — Telephone Encounter (Signed)
[facility] calling with Stat CT results for Dr. Volanda Napoleon patient. Please see CT results and advise.

## 2021-05-18 NOTE — Telephone Encounter (Signed)
[  Chest CTA findings suggestive of malignancy.]  Can we arrange a virtual visit with Tracy Davidson and family member to discuss CT results and next step. Thanks, BJ

## 2021-05-20 ENCOUNTER — Other Ambulatory Visit: Payer: Self-pay

## 2021-05-20 ENCOUNTER — Ambulatory Visit (HOSPITAL_COMMUNITY)
Admission: RE | Admit: 2021-05-20 | Discharge: 2021-05-20 | Disposition: A | Discharge status: Home / Self Care | Payer: Medicare Other | Source: Ambulatory Visit | Attending: Cardiovascular Disease | Admitting: Cardiovascular Disease

## 2021-05-20 ENCOUNTER — Telehealth: Payer: Self-pay

## 2021-05-20 ENCOUNTER — Other Ambulatory Visit: Payer: Self-pay | Admitting: Family Medicine

## 2021-05-20 ENCOUNTER — Encounter (HOSPITAL_COMMUNITY): Payer: Self-pay | Admitting: Radiology

## 2021-05-20 DIAGNOSIS — I739 Peripheral vascular disease, unspecified: Secondary | ICD-10-CM | POA: Diagnosis not present

## 2021-05-20 DIAGNOSIS — R7989 Other specified abnormal findings of blood chemistry: Secondary | ICD-10-CM

## 2021-05-20 DIAGNOSIS — M25471 Effusion, right ankle: Secondary | ICD-10-CM | POA: Diagnosis not present

## 2021-05-20 DIAGNOSIS — R600 Localized edema: Secondary | ICD-10-CM | POA: Diagnosis not present

## 2021-05-20 DIAGNOSIS — I82432 Acute embolism and thrombosis of left popliteal vein: Secondary | ICD-10-CM

## 2021-05-20 MED ORDER — APIXABAN 5 MG PO TABS: ORAL_TABLET | ORAL | 0 refills | Status: DC

## 2021-05-20 NOTE — Telephone Encounter (Signed)
Spoke with pt, she did not go to the ER as instructed. 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  We discussed treatment options and side effects. We also discussed possible complications of DVT. She does not have absolute contraindications for Eliquis, so recommend staring 10 mg bid x 7 days then 5 mg bid.  I asked her to hold on Aspirin for now.  Denies chest pain, dyspnea, palpitation, or diaphoresis. LE edema has improved.  She has mild gross hematuria after BCG bladder treatments, this time it was not different (last treatment yesterday) and states that usually lasts a couple days. She was clearly instructed about warning signs. She voices understanding and repeat all instructions back to me.  Pending appt with cardiothoracic surgeon and oncologist. F/U with Dr Volanda Napoleon will be arranged. Tracy Gonzalo Martinique, MD

## 2021-05-20 NOTE — Progress Notes (Unsigned)
Patient was seen at [facility] for bilateral lower extremity venous duplex. She is negative for DVT in the right lower extremity, and positive for an age indeterminate DVT in the left popliteal vein. Report can be found under the CV Procedure tab.   Called over to Dr. Volanda Napoleon office and spoke to [place] and she advised patient to report to the Emergency Department.  Thank you,   Sharlett Iles, RDMS, RVT

## 2021-05-20 NOTE — Telephone Encounter (Signed)
Received a phone call from [place] that pt's ultrasound was positive for a clot in the leg. Pt did not go to the ED. Called and spoke with pt, she is feeling fine, no chest pain, shortness of breath or palpitations. Patient and Dr. Martinique discussed Eliquis.

## 2021-05-24 DIAGNOSIS — Z5111 Encounter for antineoplastic chemotherapy: Secondary | ICD-10-CM | POA: Diagnosis not present

## 2021-05-24 DIAGNOSIS — C678 Malignant neoplasm of overlapping sites of bladder: Secondary | ICD-10-CM | POA: Diagnosis not present

## 2021-05-26 ENCOUNTER — Telehealth: Payer: Self-pay | Admitting: Internal Medicine

## 2021-05-26 NOTE — Telephone Encounter (Signed)
Received a new pt referral from Dr. Martinique for lung cancer. Tracy Davidson has been cld and scheduled to see Dr. Julien Nordmann on 8/16 at 2:15pm w/labs at 1:45pm.

## 2021-05-30 ENCOUNTER — Other Ambulatory Visit: Payer: Self-pay | Admitting: Medical Oncology

## 2021-05-30 DIAGNOSIS — C3490 Malignant neoplasm of unspecified part of unspecified bronchus or lung: Secondary | ICD-10-CM

## 2021-05-31 ENCOUNTER — Other Ambulatory Visit: Payer: Self-pay

## 2021-05-31 ENCOUNTER — Inpatient Hospital Stay: Payer: Medicare Other | Attending: Internal Medicine | Admitting: Internal Medicine

## 2021-05-31 ENCOUNTER — Inpatient Hospital Stay: Payer: Medicare Other

## 2021-05-31 ENCOUNTER — Encounter: Payer: Self-pay | Admitting: Internal Medicine

## 2021-05-31 VITALS — BP 168/73 | HR 91 | Temp 97.8°F | Resp 17 | Ht 63.0 in | Wt 119.8 lb

## 2021-05-31 DIAGNOSIS — F17210 Nicotine dependence, cigarettes, uncomplicated: Secondary | ICD-10-CM | POA: Insufficient documentation

## 2021-05-31 DIAGNOSIS — Z79899 Other long term (current) drug therapy: Secondary | ICD-10-CM | POA: Insufficient documentation

## 2021-05-31 DIAGNOSIS — Z86711 Personal history of pulmonary embolism: Secondary | ICD-10-CM | POA: Diagnosis not present

## 2021-05-31 DIAGNOSIS — Z8551 Personal history of malignant neoplasm of bladder: Secondary | ICD-10-CM

## 2021-05-31 DIAGNOSIS — R911 Solitary pulmonary nodule: Secondary | ICD-10-CM | POA: Diagnosis not present

## 2021-05-31 DIAGNOSIS — C3490 Malignant neoplasm of unspecified part of unspecified bronchus or lung: Secondary | ICD-10-CM | POA: Insufficient documentation

## 2021-05-31 DIAGNOSIS — Z9221 Personal history of antineoplastic chemotherapy: Secondary | ICD-10-CM

## 2021-05-31 DIAGNOSIS — R918 Other nonspecific abnormal finding of lung field: Secondary | ICD-10-CM | POA: Diagnosis not present

## 2021-05-31 LAB — CMP (CANCER CENTER ONLY)
ALT: 17 U/L (ref 0–44)
AST: 25 U/L (ref 15–41)
Albumin: 4.3 g/dL (ref 3.5–5.0)
Alkaline Phosphatase: 71 U/L (ref 38–126)
Anion gap: 9 (ref 5–15)
BUN: 10 mg/dL (ref 8–23)
CO2: 30 mmol/L (ref 22–32)
Calcium: 10.4 mg/dL — ABNORMAL HIGH (ref 8.9–10.3)
Chloride: 103 mmol/L (ref 98–111)
Creatinine: 0.78 mg/dL (ref 0.44–1.00)
GFR, Estimated: >60 mL/min (ref >60)
Glucose, Bld: 105 mg/dL — ABNORMAL HIGH (ref 70–99)
Potassium: 4 mmol/L (ref 3.5–5.1)
Sodium: 142 mmol/L (ref 135–145)
Total Bilirubin: 0.9 mg/dL (ref 0.3–1.2)
Total Protein: 8.3 g/dL — ABNORMAL HIGH (ref 6.5–8.1)

## 2021-05-31 LAB — CBC WITH DIFFERENTIAL (CANCER CENTER ONLY)
Abs Immature Granulocytes: 0.02 10*3/uL (ref 0.00–0.07)
Basophils Absolute: 0 10*3/uL (ref 0.0–0.1)
Basophils Relative: 0 %
Eosinophils Absolute: 0.3 10*3/uL (ref 0.0–0.5)
Eosinophils Relative: 4 %
HCT: 39 % (ref 36.0–46.0)
Hemoglobin: 13 g/dL (ref 12.0–15.0)
Immature Granulocytes: 0 %
Lymphocytes Relative: 14 %
Lymphs Abs: 1.3 10*3/uL (ref 0.7–4.0)
MCH: 33.2 pg (ref 26.0–34.0)
MCHC: 33.3 g/dL (ref 30.0–36.0)
MCV: 99.7 fL (ref 80.0–100.0)
Monocytes Absolute: 0.6 10*3/uL (ref 0.1–1.0)
Monocytes Relative: 6 %
Neutro Abs: 6.8 10*3/uL (ref 1.7–7.7)
Neutrophils Relative %: 76 %
Platelet Count: 180 10*3/uL (ref 150–400)
RBC: 3.91 MIL/uL (ref 3.87–5.11)
RDW: 13.4 % (ref 11.5–15.5)
WBC Count: 9 10*3/uL (ref 4.0–10.5)
nRBC: 0 % (ref 0.0–0.2)

## 2021-05-31 NOTE — Progress Notes (Signed)
[place] [phone]   [phone]  CONSULT NOTE  REFERRING PHYSICIAN: Dr. [name]  REASON FOR CONSULTATION:  80 years old African-American female with suspicious lung cancer.  HPI Tracy Davidson is a 80 y.o. 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 Gloriann Loan from [facility], cataract surgery, GERD, hypertension, peripheral vascular disease as well as long history of smoking.  The patient was seen by her primary care physician for evaluation of cough and shortness of breath.  Dr. Volanda Napoleon was concerned about possibility of pulmonary embolism and the patient was sent for CT angiogram of the chest which was performed on 05/18/2021 and it showed no evidence for pulmonary embolism but there was a perihilar mass of the right middle lobe measuring 2.6 x 2.5 cm which closely abuts the pleura and pericardium consistent with primary lung malignancy and obstructs the middle lobe bronchi with bronchial impaction and volume loss of the right middle lobe.  There are multiple small predominantly groundglass nodules throughout the right middle lobe likely postobstructive airspace disease and additional small nonspecific nodules of the right middle and right lower lobe about the right major fissure measuring 0.7 and 0.6 cm with no lymphadenopathy.  Dr. Volanda Napoleon kindly referred the patient to me today for evaluation and recommendation regarding her condition. When seen today the patient is feeling fine with no concerning complaints except for mild cough and arthritis.  She denied having any chest pain, shortness of breath, or hemoptysis.  She has no nausea, vomiting, diarrhea or constipation.  She denied having any headache or visual changes.  She has no recent weight loss or night sweats. Family history significant for mother  with heart disease and peripheral vascular disease.  Father died when she was 62 years old and she has a sister with questionable cancer. The patient is single and has no children.  She was accompanied today by her friend Tracy Davidson.  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  She has a history for smoking 1 pack/day for around 60 years and unfortunately she continues to smoke.  She also drinks 2 alcoholic drinks every day.  No history of drug abuse.  HPI  Past Medical History:  Diagnosis Date   ALLERGIC RHINITIS 09/20/2009   Allergy    Arthritis    LEGS and left arm   Bladder tumor    Cataract    both eyes, right worse thsn left   COLONIC POLYPS, HX OF 06/06/2007   DYSLIPIDEMIA 09/20/2009   Dyspnea    sob with activity   GERD (gastroesophageal reflux disease)    HYPERTENSION 06/06/2007   Knee pain    stiff and pain on side   LIVER FUNCTION TESTS, ABNORMAL, HX OF    feb or march 2019   Peripheral vascular disease ([place])    Shingles 11/2019   left arm and shoulder    Past Surgical History:  Procedure Laterality Date   ABDOMINAL AORTOGRAM W/LOWER EXTREMITY Bilateral 03/03/2020   Procedure: ABDOMINAL AORTOGRAM W/LOWER EXTREMITY;  Surgeon: Marty Heck, MD;  Location: [facility];  Service: Cardiovascular;  Laterality: Bilateral;   BREAST EXCISIONAL BIOPSY Left 1975   COLONOSCOPY  ?2015   CYSTOSCOPY WITH BIOPSY N/A 04/12/2020   Procedure: CYSTOSCOPY WITH BIOPSY  INSTILLATION OF GEMCITABINE;  Surgeon: Lucas Mallow, MD;  Location: [facility];  Service: Urology;  Laterality: N/A;   DILATION AND CURETTAGE OF UTERUS  yrs ago   PERIPHERAL VASCULAR INTERVENTION Right 03/03/2020   Procedure: PERIPHERAL VASCULAR INTERVENTION;  Surgeon: Marty Heck, MD;  Location: [facility];  Service: Cardiovascular;  Laterality: Right;   RADIOACTIVE SEED GUIDED EXCISIONAL BREAST BIOPSY Left 02/02/2021   Procedure: RADIOACTIVE  SEED GUIDED EXCISIONAL LEFT BREAST BIOPSY;  Surgeon: Stark Klein, MD;  Location: [facility];  Service: General;  Laterality: Left;   [place]  yrs ago   complete   TRANSURETHRAL RESECTION OF BLADDER TUMOR N/A 09/18/2018   Procedure: RESTAGE TRANSURETHRAL RESECTION OF BLADDER TUMOR (TURBT);  Surgeon: Lucas Mallow, MD;  Location: [facility];  Service: Urology;  Laterality: N/A;   TRANSURETHRAL RESECTION OF BLADDER TUMOR WITH MITOMYCIN-C N/A 08/16/2018   Procedure: TRANSURETHRAL RESECTION OF BLADDER TUMOR;  Surgeon: Lucas Mallow, MD;  Location: [facility];  Service: Urology;  Laterality: N/A;    Family History  Problem Relation Age of Onset   Colon cancer Neg Hx    Colon polyps Neg Hx    Esophageal cancer Neg Hx    Rectal cancer Neg Hx    Stomach cancer Neg Hx    Pancreatic cancer Neg Hx    Breast cancer Neg Hx     Social History Social History   Tobacco Use   Smoking status: Every Day    Packs/day: 1.00    Years: 50.00    Pack years: 50.00    Types: Cigarettes   Smokeless tobacco: Never  Vaping Use   Vaping Use: Never used  Substance Use Topics   Alcohol use: Not Currently    Alcohol/week: 2.0 standard drinks    Types: 2 Shots of liquor per week    Comment: daily alcohol   Drug use: No    Allergies  Allergen Reactions   Doxycycline Palpitations    Current Outpatient Medications  Medication Sig Dispense Refill   apixaban (ELIQUIS) 5 MG TABS tablet 2 tabs bid x 7 days then 1 tab bid. 40 tablet 0   aspirin EC 81 MG tablet Take 1 tablet (81 mg total) by mouth daily. 30 tablet 11   atorvastatin (LIPITOR) 10 MG tablet Take 1 tablet (10 mg total) by mouth daily. 90 tablet 3   calcium carbonate (OSCAL) 1500 (600 Ca) MG TABS tablet Take 1,200 mg by mouth 2 (two) times daily with a meal.      capsicum (ZOSTRIX) 0.075 % topical cream Apply thin film on knee 3-4 times per day as  needed 28.3 g 0   Cholecalciferol (VITAMIN D) 125 MCG (5000 UT) CAPS Take 5,000 Units by mouth daily.      KLOR-CON M20 20 MEQ tablet TAKE 1 TABLET BY MOUTH EVERY DAY 90 tablet 2   lisinopril-hydrochlorothiazide (ZESTORETIC) 20-25 MG tablet TAKE 1 TABLET BY MOUTH EVERY DAY 90 tablet 0   omeprazole (PRILOSEC) 20 MG capsule TAKE 1 CAPSULE BY MOUTH EVERY DAY 90 capsule 1   triamcinolone ointment (KENALOG) 0.5 % Apply 1 application topically 2 (two) times daily. 30 g 0   furosemide (LASIX) 20 MG tablet Take 1 tablet (20 mg total) by mouth daily for 2 days. 2 tablet 0   No current facility-administered medications for this visit.    Review of Systems  Constitutional: negative Eyes: negative Ears, nose, mouth, throat, and face: negative Respiratory: positive for cough  Cardiovascular: negative Gastrointestinal: negative Genitourinary:negative Integument/breast: negative Hematologic/lymphatic: negative Musculoskeletal:negative Neurological: negative Behavioral/Psych: negative Endocrine: negative Allergic/Immunologic: negative  Physical Exam  MLY:YTKPT, healthy, no distress, well nourished, well developed, and anxious SKIN: skin color, texture, turgor are normal, no rashes or significant lesions HEAD: Normocephalic, No masses, lesions, tenderness or abnormalities EYES: normal, PERRLA, Conjunctiva are pink and non-injected EARS: External ears normal, Canals clear OROPHARYNX:no exudate, no erythema, and lips, buccal mucosa, and tongue normal  NECK: supple, no adenopathy, no JVD LYMPH:  no palpable lymphadenopathy, no hepatosplenomegaly BREAST:not examined LUNGS: clear to auscultation , and palpation HEART: regular rate & rhythm, no murmurs, and no gallops ABDOMEN:abdomen soft, non-tender, normal bowel sounds, and no masses or organomegaly BACK: No CVA tenderness, Range of motion is normal EXTREMITIES:no joint deformities, effusion, or inflammation, no edema  NEURO: alert & oriented x  3 with fluent speech, no focal motor/sensory deficits  PERFORMANCE STATUS: ECOG 1  LABORATORY DATA: Lab Results  Component Value Date   WBC 9.0 05/31/2021   HGB 13.0 05/31/2021   HCT 39.0 05/31/2021   MCV 99.7 05/31/2021   PLT 180 05/31/2021      Chemistry      Component Value Date/Time   NA 142 05/31/2021 1333   K 4.0 05/31/2021 1333   CL 103 05/31/2021 1333   CO2 30 05/31/2021 1333   BUN 10 05/31/2021 1333   CREATININE 0.78 05/31/2021 1333      Component Value Date/Time   CALCIUM 10.4 (H) 05/31/2021 1333   ALKPHOS 71 05/31/2021 1333   AST 25 05/31/2021 1333   ALT 17 05/31/2021 1333   BILITOT 0.9 05/31/2021 1333       RADIOGRAPHIC STUDIES: CT Angio Chest Pulmonary Embolism (PE) W or WO Contrast  Result Date: 05/18/2021 CLINICAL DATA:  PE suspected, former smoker EXAM: CT ANGIOGRAPHY CHEST WITH CONTRAST TECHNIQUE: Multidetector CT imaging of the chest was performed using the standard protocol during bolus administration of intravenous contrast. Multiplanar CT image reconstructions and MIPs were obtained to evaluate the vascular anatomy. CONTRAST:  41mL OMNIPAQUE IOHEXOL 350 MG/ML SOLN COMPARISON:  None. FINDINGS: Cardiovascular: Preferential opacification of the pulmonary arteries. No evidence of filling defect. Normal heart size. Scattered left coronary artery calcifications no pericardial effusion. Aortic atherosclerosis. Mediastinum/Nodes: No enlarged mediastinal, hilar, or axillary lymph nodes. Thyroid gland, trachea, and esophagus demonstrate no significant findings. Lungs/Pleura: Mild centrilobular emphysema. There is a perihilar mass of the right middle lobe measuring 2.6 x 2.5 cm, which closely abuts the pleura and pericardium (series 4, image 91). This lesion obstructs the middle lobe bronchi, with bronchial impaction and volume loss of the right middle lobe. There are multiple small, predominantly ground-glass nodules throughout the right middle lobe, likely  postobstructive airspace disease (series 6, image 118). Additional small nonspecific nodules of the right middle and right lower lobes about the right major fissure measuring 0.7 and 0.6 cm (series 6, image 76) no pleural effusion or pneumothorax. Upper Abdomen: No acute abnormality. Incompletely imaged fluid attenuation lesion of the right lobe of the liver, presumably a cyst or hemangioma (series 5, image 302). Musculoskeletal: No chest wall mass or suspicious bone lesions identified. IMPRESSION: 1. Negative examination for pulmonary embolism. 2. There is a perihilar mass of the right middle lobe measuring 2.6 x 2.5 cm, which closely abuts the pleura and pericardium. This lesion is consistent with primary lung malignancy, and obstructs the middle lobe bronchi, with bronchial impaction and volume loss of the right middle lobe. 3. There are multiple small, predominantly  ground-glass nodules throughout the right middle lobe, likely postobstructive airspace disease. 4. Additional small nonspecific nodules of the right middle and right lower lobes about the right major fissure measuring 0.7 and 0.6 cm. 5. No lymphadenopathy. 6. Emphysema. 7. Coronary artery disease. These results will be called to the ordering clinician or representative by the Radiologist Assistant, and communication documented in the PACS or [employer]. Aortic Atherosclerosis (ICD10-I70.0) and Emphysema (ICD10-J43.9). Electronically Signed   By: Eddie Candle M.D.   On: 05/18/2021 12:03   VAS [place] LOWER EXTREMITY VENOUS (DVT)  Result Date: 05/20/2021  Lower Venous DVT Study Patient Name:  SAFINA HUARD [facility]  Date of Exam:   05/20/2021 Medical Rec #: [id number]         Accession #:    [id number] Date of Birth: 1940-11-19         Patient Gender: F Patient Age:   15 years Exam Location:  [facility] Procedure:      VAS [place] LOWER EXTREMITY VENOUS (DVT) Referring Phys: Larene Beach BANKS  --------------------------------------------------------------------------------  Indications: Elevated d-dimer and pedal edema. Patient report chronic bilateral lower extremity pain possibly due to arthritis. History of PAD with right iliac artey stenting. She denies SOB.  Risk Factors: None identified. Anticoagulation: 81 mg.  Comparison Study: NA Performing Technologist: Sharlett Iles RVT  Examination Guidelines: A complete evaluation includes B-mode imaging, spectral Doppler, color Doppler, and power Doppler as needed of all accessible portions of each vessel. Bilateral testing is considered an integral part of a complete examination. Limited examinations for reoccurring indications may be performed as noted. The reflux portion of the exam is performed with the patient in reverse Trendelenburg.  +---------+---------------+---------+-----------+----------+--------------+ RIGHT    CompressibilityPhasicitySpontaneityPropertiesThrombus Aging +---------+---------------+---------+-----------+----------+--------------+ CFV      Full           Yes      Yes                                 +---------+---------------+---------+-----------+----------+--------------+ SFJ      Full           Yes      Yes                                 +---------+---------------+---------+-----------+----------+--------------+ FV Prox  Full           Yes      Yes                                 +---------+---------------+---------+-----------+----------+--------------+ FV Mid   Full                                                        +---------+---------------+---------+-----------+----------+--------------+ FV DistalFull           Yes      Yes                                 +---------+---------------+---------+-----------+----------+--------------+ PFV      Full           Yes      Yes                                  +---------+---------------+---------+-----------+----------+--------------+  POP      Full           Yes      Yes                                 +---------+---------------+---------+-----------+----------+--------------+ PTV      Full           Yes      Yes                                 +---------+---------------+---------+-----------+----------+--------------+ PERO     Full           Yes      Yes                                 +---------+---------------+---------+-----------+----------+--------------+ Gastroc  Full                                                        +---------+---------------+---------+-----------+----------+--------------+ GSV      Full           Yes      Yes                                 +---------+---------------+---------+-----------+----------+--------------+   Right Technical Findings: Mulitple enlarged lymph nodes in the groin, with largest measuring 2.5 cm in length and 1.3 cm in width.  +---------+---------------+---------+-----------+---------------+--------------+ LEFT     CompressibilityPhasicitySpontaneityProperties     Thrombus Aging +---------+---------------+---------+-----------+---------------+--------------+ CFV      Full           Yes      Yes                                      +---------+---------------+---------+-----------+---------------+--------------+ SFJ      Full           Yes      Yes                                      +---------+---------------+---------+-----------+---------------+--------------+ FV Prox  Full           Yes      Yes                                      +---------+---------------+---------+-----------+---------------+--------------+ FV Mid   Full                                                             +---------+---------------+---------+-----------+---------------+--------------+ FV DistalFull           Yes      Yes                                       +---------+---------------+---------+-----------+---------------+--------------+  PFV      Full           Yes      Yes                                      +---------+---------------+---------+-----------+---------------+--------------+ POP      Partial        Yes      Yes        softly and     Age                                                        brightly       Indeterminate                                              echogenic                     +---------+---------------+---------+-----------+---------------+--------------+ PTV      Full                    No                                       +---------+---------------+---------+-----------+---------------+--------------+ PERO     Full                    No                                       +---------+---------------+---------+-----------+---------------+--------------+ Gastroc  Full                                                             +---------+---------------+---------+-----------+---------------+--------------+ GSV      Full           Yes      Yes                                      +---------+---------------+---------+-----------+---------------+--------------+   Left Technical Findings: Mulitple enlarged lymph nodes in the groin, with largest measuring 2.4 cm in length and 1.8 cm in width.   Summary: RIGHT: - No evidence of deep vein thrombosis in the lower extremity. No indirect evidence of obstruction proximal to the inguinal ligament. - No cystic structure found in the popliteal fossa. - Mulitple enlarged lymph nodes in the groin, with largest measuring 2.5 cm in length and 1.3 cm in width.  LEFT: - Findings consistent with age indeterminate deep vein thrombosis involving the left popliteal vein. - Mulitple enlarged lymph nodes in the groin, with largest measuring 2.4 cm in length and 1.8 cm in width. - All  other veins visualized appear fully compressible and demonstrate  appropriate Doppler characteristics.  *See table(s) above for measurements and observations. Electronically signed by Jenkins Rouge MD on 05/20/2021 at 1:55:51 PM.    Final     ASSESSMENT: This is a very pleasant 80 years old African-American female with highly suspicious lung cancer presented with a right middle lobe central lung mass in addition to suspicious right middle lobe and right lower lobe nodules.   PLAN: I had a lengthy discussion with the patient and her friend today about her current condition and further investigation to confirm her diagnosis. I personally and independently reviewed the scan images and discussed the result and showed the images to the patient today. I recommended for the patient to complete the staging work-up by ordering a PET scan as well as MRI of the brain to rule out any other metastatic disease. I will also refer the patient to pulmonary medicine for consideration of bronchoscopy with EBUS for confirmation of the tissue diagnosis. I will see the patient back for follow-up visit in 2 weeks for evaluation and more detailed discussion of her treatment options based on the final pathology and staging work-up. For the smoking cessation I strongly encouraged the patient to quit smoking. She was advised to call immediately if she has any concerning symptoms in the interval. The patient voices understanding of current disease status and treatment options and is in agreement with the current care plan.  All questions were answered. The patient knows to call the clinic with any problems, questions or concerns. We can certainly see the patient much sooner if necessary.  Thank you so much for allowing me to participate in the care of Tracy Davidson. I will continue to follow up the patient with you and assist in her care.  The total time spent in the appointment was 60 minutes.  Disclaimer: This note was dictated with voice recognition software. Similar sounding words can  inadvertently be transcribed and may not be corrected upon review.   Eilleen Kempf May 31, 2021, 2:41 PM

## 2021-06-03 ENCOUNTER — Ambulatory Visit (INDEPENDENT_AMBULATORY_CARE_PROVIDER_SITE_OTHER): Payer: Medicare Other | Admitting: Family Medicine

## 2021-06-03 ENCOUNTER — Other Ambulatory Visit: Payer: Self-pay

## 2021-06-03 ENCOUNTER — Telehealth: Payer: Self-pay | Admitting: Internal Medicine

## 2021-06-03 ENCOUNTER — Encounter: Payer: Self-pay | Admitting: Family Medicine

## 2021-06-03 VITALS — BP 128/74 | HR 100 | Temp 98.6°F | Wt 120.8 lb

## 2021-06-03 DIAGNOSIS — M25561 Pain in right knee: Secondary | ICD-10-CM | POA: Diagnosis not present

## 2021-06-03 DIAGNOSIS — I82532 Chronic embolism and thrombosis of left popliteal vein: Secondary | ICD-10-CM

## 2021-06-03 DIAGNOSIS — R911 Solitary pulmonary nodule: Secondary | ICD-10-CM | POA: Diagnosis not present

## 2021-06-03 DIAGNOSIS — I700 Atherosclerosis of aorta: Secondary | ICD-10-CM | POA: Diagnosis not present

## 2021-06-03 DIAGNOSIS — I82592 Chronic embolism and thrombosis of other specified deep vein of left lower extremity: Secondary | ICD-10-CM

## 2021-06-03 MED ORDER — APIXABAN 5 MG PO TABS
5.0000 mg | ORAL_TABLET | Freq: Two times a day (BID) | ORAL | 2 refills | Status: DC
Start: 2021-06-03 — End: 2021-09-07

## 2021-06-03 NOTE — Progress Notes (Signed)
Subjective:    Patient ID: Tracy Davidson, female    DOB: 1941/04/01, 80 y.o.   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  Patient seen at the end of July for ankle pain.  Labs revealed elevated D-dimer.  LE ultrasound with old clot.  CTA done 05/18/21 with a right middle lobe mass consistent with primary lung malignancy.  Patient seen and started on Eliquis by Dr. Martinique as this provider was out of the office.  Patient states she is doing okay.  Denies pain, SOB.  Endorses RLE pain in the lateral calf every other day.  Denies edema.  Patient not currently taking aspirin.  Past Medical History:  Diagnosis Date   ALLERGIC RHINITIS 09/20/2009   Allergy    Arthritis    LEGS and left arm   Bladder tumor    Cataract    both eyes, right worse thsn left   COLONIC POLYPS, HX OF 06/06/2007   DYSLIPIDEMIA 09/20/2009   Dyspnea    sob with activity   GERD (gastroesophageal reflux disease)    HYPERTENSION 06/06/2007   Knee pain    stiff and pain on side   LIVER FUNCTION TESTS, ABNORMAL, HX OF    feb or march 2019   Peripheral vascular disease ([place])    Shingles 11/2019   left arm and shoulder    Allergies  Allergen Reactions   Doxycycline Palpitations    ROS General: Denies fever, chills, night sweats, changes in weight, changes in appetite HEENT: Denies headaches, ear pain, changes in vision, rhinorrhea, sore throat CV: Denies CP, palpitations, SOB, orthopnea Pulm: Denies SOB, cough, wheezing GI: Denies abdominal pain, nausea, vomiting, diarrhea, constipation GU: Denies dysuria, hematuria, frequency, vaginal discharge Msk: Denies muscle cramps, joint pains + RLE  pain/calf pain. Neuro: Denies weakness, numbness, tingling Skin: Denies rashes, bruising Psych: Denies depression, anxiety, hallucinations     Objective:    Blood pressure 128/74, pulse 100, temperature 98.6 F (37 C), temperature source Oral, weight 120 lb 12.8 oz (54.8 kg), SpO2 97 %.  Gen. Pleasant, well-nourished, in no distress, normal affect   HEENT: [place]/AT, face symmetric, conjunctiva clear, no scleral icterus, PERRLA, EOMI, nares patent without drainage Lungs: no accessory muscle use, CTAB, no wheezes or rales Cardiovascular: RRR, no m/r/g, no peripheral edema Musculoskeletal: No deformities, no cyanosis or clubbing, normal tone Neuro:  A&Ox3, CN II-XII intact, normal gait Skin:  Warm, no lesions/ rash   Wt Readings from Last 3 Encounters:  06/03/21 120 lb 12.8 oz (54.8 kg)  05/31/21 119 lb 12.8 oz (54.3 kg)  05/12/21 123 lb 12.8 oz (56.2 kg)    Lab Results  Component Value Date   WBC 9.0 05/31/2021   HGB 13.0 05/31/2021   HCT 39.0 05/31/2021   PLT 180 05/31/2021   GLUCOSE 105 (H) 05/31/2021   CHOL 199 01/29/2020   TRIG 60.0 01/29/2020   HDL 98.90 01/29/2020   LDLDIRECT 143.2 03/14/2011   LDLCALC 88 01/29/2020   ALT 17 05/31/2021   AST 25 05/31/2021   NA 142 05/31/2021   K 4.0 05/31/2021   CL 103 05/31/2021   CREATININE 0.78 05/31/2021   BUN  10 05/31/2021   CO2 30 05/31/2021   TSH 2.35 01/29/2020   HGBA1C 4.5 (L) 01/29/2020    Assessment/Plan:  Right knee pain, unspecified chronicity -Discussed supportive care including ice, heat, topical analgesics, Tylenol -X-ray for continued symptoms  Lung nodule -Current smoker.  Discussed the importance of smoking cessation -Noted on CTA chest 05/18/2021: Perihilar mass of right middle lobe measuring 2.6 x 2.5 cm which closely abuts the pleura and pericardium.  Lesion consistent with primary lung malignancy and obstructs the middle lobe bronchi with bronchial impaction and volume loss of the right middle lobe.   Multiple small groundglass nodules throughout right middle lobe.  Emphysema.  CAD. -Upcoming appointments with oncology and CT surgery.  Chronic deep vein thrombosis (DVT) of popliteal vein of left lower extremity (HCC)  -Age-indeterminate DVT in left popliteal vein noted 05/20/2021 -CTA chest negative for PE. -Discussed continuing Eliquis 5 mg daily times at least the next 2 months. - Plan: apixaban (ELIQUIS) 5 MG TABS tablet  Aortic atherosclerosis -Noted on CTA chest from 05/18/2021 -Continue statin and anticoagulant  F/u as needed in the next few weeks  [name], MD

## 2021-06-03 NOTE — Telephone Encounter (Signed)
Scheduled per los. Called and left msg.

## 2021-06-07 ENCOUNTER — Other Ambulatory Visit: Payer: Self-pay

## 2021-06-07 ENCOUNTER — Ambulatory Visit (HOSPITAL_COMMUNITY)
Admission: RE | Admit: 2021-06-07 | Discharge: 2021-06-07 | Disposition: A | Discharge status: Home / Self Care | Payer: Medicare Other | Source: Ambulatory Visit | Attending: Internal Medicine | Admitting: Internal Medicine

## 2021-06-07 DIAGNOSIS — C3490 Malignant neoplasm of unspecified part of unspecified bronchus or lung: Secondary | ICD-10-CM | POA: Diagnosis not present

## 2021-06-07 DIAGNOSIS — D1802 Hemangioma of intracranial structures: Secondary | ICD-10-CM | POA: Diagnosis not present

## 2021-06-07 MED ORDER — GADOBUTROL 1 MMOL/ML IV SOLN
5.0000 mL | Freq: Once | INTRAVENOUS | Status: AC | PRN
  Administered 2021-06-07: 5 mL via INTRAVENOUS

## 2021-06-08 ENCOUNTER — Ambulatory Visit: Payer: Medicare Other | Admitting: Pulmonary Disease

## 2021-06-08 ENCOUNTER — Encounter: Payer: Self-pay | Admitting: Pulmonary Disease

## 2021-06-08 ENCOUNTER — Telehealth: Payer: Self-pay | Admitting: Pulmonary Disease

## 2021-06-08 VITALS — BP 120/68 | HR 88 | Temp 98.8°F | Ht 63.0 in | Wt 121.4 lb

## 2021-06-08 DIAGNOSIS — Z7901 Long term (current) use of anticoagulants: Secondary | ICD-10-CM | POA: Diagnosis not present

## 2021-06-08 DIAGNOSIS — F17200 Nicotine dependence, unspecified, uncomplicated: Secondary | ICD-10-CM | POA: Diagnosis not present

## 2021-06-08 DIAGNOSIS — R9389 Abnormal findings on diagnostic imaging of other specified body structures: Secondary | ICD-10-CM | POA: Diagnosis not present

## 2021-06-08 DIAGNOSIS — R911 Solitary pulmonary nodule: Secondary | ICD-10-CM | POA: Diagnosis not present

## 2021-06-08 DIAGNOSIS — I82499 Acute embolism and thrombosis of other specified deep vein of unspecified lower extremity: Secondary | ICD-10-CM | POA: Diagnosis not present

## 2021-06-08 DIAGNOSIS — Z720 Tobacco use: Secondary | ICD-10-CM

## 2021-06-08 NOTE — Patient Instructions (Signed)
Thank you for visiting Dr. Valeta Harms at [facility]. Today we recommend the following:  Orders Placed This Encounter  Procedures   Procedural/ Surgical Case Request: VIDEO BRONCHOSCOPY WITH ENDOBRONCHIAL NAVIGATION, VIDEO BRONCHOSCOPY WITH ENDOBRONCHIAL ULTRASOUND   Ambulatory referral to Pulmonology   LAST DOSE OF ELIQUIS ON Saturday 27th. Nothing to eat past midnight the night before the procedure.   Return in about 2 weeks (around 06/22/2021) for with APP or Dr. Valeta Harms. For post-bronch follow up     Please do your part to reduce the spread of COVID-19.

## 2021-06-08 NOTE — H&P (View-Only) (Signed)
Synopsis: Referred in August 2022 for abnormal CT imaging, by Dr. Earlie Server, PCP: By Billie Ruddy, MD  Subjective:   PATIENT ID: Tracy Davidson GENDER: female DOB: 11-08-1940, MRN: [id number]  Chief Complaint  Patient presents with   Consult    Pt is here to discuss bronch with Dr. Valeta Harms.  Pt denies any complaints of cough, SOB, or chest discomfort.     This is an 80 year old female, past medical history of hypertension, GERD, dyslipidemia.College he after having abnormal CT imaging.  Patient was referred to medical CT imaging of the chest was completed on 05/18/2021.  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  Patient was referred here for consideration of bronchoscopy and tissue sampling.  Patient has a PET scan scheduled for 06/15/2021.  Office note from Dr. Earlie Server on 05/31/2021 reviewed.  She is a current smoker 1 pack/day for greater than 60 years.  Patient has no respiratory complaints today.  Patient denies hemoptysis.  Of note was recently diagnosed with an age indeterminant lower extremity clot and started on Eliquis.   Past Medical History:  Diagnosis Date   ALLERGIC RHINITIS 09/20/2009   Allergy    Arthritis    LEGS and left arm   Bladder tumor    Cataract    both eyes, right worse thsn left   COLONIC POLYPS, HX OF 06/06/2007   DYSLIPIDEMIA 09/20/2009   Dyspnea    sob with activity   GERD (gastroesophageal reflux disease)    HYPERTENSION 06/06/2007   Knee pain    stiff and pain on side   LIVER FUNCTION TESTS, ABNORMAL, HX OF    feb or march 2019   Peripheral vascular disease ([place])    Shingles 11/2019   left arm and shoulder     Family History  Problem Relation Age of Onset   Colon cancer Neg Hx    Colon polyps Neg Hx    Esophageal cancer Neg Hx    Rectal cancer Neg Hx    Stomach cancer Neg Hx    Pancreatic cancer Neg Hx    Breast cancer Neg  Hx      Past Surgical History:  Procedure Laterality Date   ABDOMINAL AORTOGRAM W/LOWER EXTREMITY Bilateral 03/03/2020   Procedure: ABDOMINAL AORTOGRAM W/LOWER EXTREMITY;  Surgeon: Marty Heck, MD;  Location: [facility];  Service: Cardiovascular;  Laterality: Bilateral;   BREAST EXCISIONAL BIOPSY Left 1975   COLONOSCOPY  ?2015   CYSTOSCOPY WITH BIOPSY N/A 04/12/2020   Procedure: CYSTOSCOPY WITH BIOPSY  INSTILLATION OF GEMCITABINE;  Surgeon: Lucas Mallow, MD;  Location[facility];  Service: Urology;  Laterality: N/A;   DILATION AND CURETTAGE OF UTERUS  yrs ago   PERIPHERAL VASCULAR INTERVENTION Right 03/03/2020   Procedure: PERIPHERAL VASCULAR INTERVENTION;  Surgeon: Marty Heck, MD;  Location: [facility];  Service: Cardiovascular;  Laterality: Right;   RADIOACTIVE SEED GUIDED EXCISIONAL BREAST BIOPSY Left 02/02/2021   Procedure: RADIOACTIVE SEED GUIDED EXCISIONAL LEFT BREAST BIOPSY;  Surgeon: Stark Klein, MD;  Location: [place];  Service: General;  Laterality: Left;   [place]  yrs ago   complete   TRANSURETHRAL RESECTION OF BLADDER TUMOR N/A 09/18/2018   Procedure: RESTAGE TRANSURETHRAL RESECTION OF BLADDER TUMOR (TURBT);  Surgeon: Lucas Mallow, MD;  Location:  [place];  Service: Urology;  Laterality: N/A;   TRANSURETHRAL RESECTION OF BLADDER TUMOR WITH MITOMYCIN-C N/A 08/16/2018   Procedure: TRANSURETHRAL RESECTION OF BLADDER TUMOR;  Surgeon: Lucas Mallow, MD;  Location: [facility];  Service: Urology;  Laterality: N/A;    Social History   Socioeconomic History   Marital status: Single    Spouse name: Not on file   Number of children: 0   Years of education: 6 years college   Highest education level: Master's degree (e.g., MA, MS, MEng, MEd, MSW, MBA)  Occupational History   Occupation: retired  Tobacco Use   Smoking  status: Every Day    Packs/day: 1.00    Years: 50.00    Pack years: 50.00    Types: Cigarettes    Start date: 1966   Smokeless tobacco: Never   Tobacco comments:    Currently smoking 1ppd as of 06/08/21 ep  Vaping Use   Vaping Use: Never used  Substance and Sexual Activity   Alcohol use: Not Currently    Alcohol/week: 2.0 standard drinks    Types: 2 Shots of liquor per week    Comment: daily alcohol   Drug use: No   Sexual activity: Not on file  Other Topics Concern   Not on file  Social History Narrative   Not on file   Social Determinants of Health   Financial Resource Strain: Low Risk    Difficulty of Paying Living Expenses: Not hard at all  Food Insecurity: No Food Insecurity   Worried About Charity fundraiser in the Last Year: Never true   [place] in the Last Year: Never true  Transportation Needs: No Transportation Needs   Lack of Transportation (Medical): No   Lack of Transportation (Non-Medical): No  Physical Activity: Inactive   Days of Exercise per Week: 0 days   Minutes of Exercise per Session: 0 min  Stress: No Stress Concern Present   Feeling of Stress : Not at all  Social Connections: Moderately Integrated   Frequency of Communication with Friends and Family: More than three times a week   Frequency of Social Gatherings with Friends and Family: Twice a week   Attends Religious Services: More than 4 times per year   Active Member of [employer] or Organizations: Yes   Attends Music therapist: More than 4 times per year   Marital Status: Divorced  Human resources officer Violence: Not At Risk   Fear of Current or Ex-Partner: No   Emotionally Abused: No   Physically Abused: No   Sexually Abused: No     Allergies  Allergen Reactions   Doxycycline Palpitations     Outpatient Medications Prior to Visit  Medication Sig Dispense Refill   apixaban (ELIQUIS) 5 MG TABS tablet Take 1 tablet (5 mg total) by mouth 2 (two) times daily. 60 tablet 2    atorvastatin (LIPITOR) 10 MG tablet Take 1 tablet (10 mg total) by mouth daily. 90 tablet 3   calcium carbonate (OSCAL) 1500 (600 Ca) MG TABS tablet Take 1,200 mg by mouth 2 (two) times daily with a meal.      Cholecalciferol (VITAMIN D) 125 MCG (5000 UT) CAPS Take 5,000 Units by mouth daily.      KLOR-CON M20 20 MEQ tablet TAKE 1 TABLET BY MOUTH EVERY DAY 90 tablet 2   lisinopril-hydrochlorothiazide (ZESTORETIC) 20-25 MG tablet TAKE 1 TABLET BY MOUTH EVERY DAY 90 tablet 0   omeprazole (  PRILOSEC) 20 MG capsule TAKE 1 CAPSULE BY MOUTH EVERY DAY 90 capsule 1   furosemide (LASIX) 20 MG tablet Take 1 tablet (20 mg total) by mouth daily for 2 days. 2 tablet 0   capsicum (ZOSTRIX) 0.075 % topical cream Apply thin film on knee 3-4 times per day as needed 28.3 g 0   triamcinolone ointment (KENALOG) 0.5 % Apply 1 application topically 2 (two) times daily. 30 g 0   No facility-administered medications prior to visit.    Review of Systems  Constitutional:  Negative for chills, fever, malaise/fatigue and weight loss.  HENT:  Negative for hearing loss, sore throat and tinnitus.   Eyes:  Negative for blurred vision and double vision.  Respiratory:  Negative for cough, hemoptysis, sputum production, shortness of breath, wheezing and stridor.   Cardiovascular:  Positive for leg swelling. Negative for chest pain, palpitations, orthopnea and PND.  Gastrointestinal:  Negative for abdominal pain, constipation, diarrhea, heartburn, nausea and vomiting.  Genitourinary:  Negative for dysuria, hematuria and urgency.  Musculoskeletal:  Negative for joint pain and myalgias.  Skin:  Negative for itching and rash.  Neurological:  Negative for dizziness, tingling, weakness and headaches.  Endo/Heme/Allergies:  Negative for environmental allergies. Does not bruise/bleed easily.  Psychiatric/Behavioral:  Negative for depression. The patient is not nervous/anxious and does not have insomnia.   All other systems reviewed  and are negative.   Objective:  Physical Exam Vitals reviewed.  Constitutional:      General: She is not in acute distress.    Appearance: She is well-developed.  HENT:     Head: Normocephalic and atraumatic.  Eyes:     General: No scleral icterus.    Conjunctiva/sclera: Conjunctivae normal.     Pupils: Pupils are equal, round, and reactive to light.  Neck:     Vascular: No JVD.     Trachea: No tracheal deviation.  Cardiovascular:     Rate and Rhythm: Normal rate and regular rhythm.     Heart sounds: Normal heart sounds. No murmur heard. Pulmonary:     Effort: Pulmonary effort is normal. No tachypnea, accessory muscle usage or respiratory distress.     Breath sounds: No stridor. No wheezing, rhonchi or rales.     Comments: Diminished breath sounds bilaterally Abdominal:     General: Bowel sounds are normal. There is no distension.     Palpations: Abdomen is soft.     Tenderness: There is no abdominal tenderness.  Musculoskeletal:        General: No tenderness.     Cervical back: Neck supple.  Lymphadenopathy:     Cervical: No cervical adenopathy.  Skin:    General: Skin is warm and dry.     Capillary Refill: Capillary refill takes less than 2 seconds.     Findings: No rash.  Neurological:     Mental Status: She is alert and oriented to person, place, and time.  Psychiatric:        Behavior: Behavior normal.     Vitals:   06/08/21 1332  BP: 120/68  Pulse: 88  Temp: 98.8 F (37.1 C)  TempSrc: Oral  SpO2: 100%  Weight: 121 lb 6.4 oz (55.1 kg)  Height: '5\' 3"'  (1.6 m)   100% on RA BMI Readings from Last 3 Encounters:  06/08/21 21.51 kg/m  06/03/21 21.40 kg/m  05/31/21 21.22 kg/m   Wt Readings from Last 3 Encounters:  06/08/21 121 lb 6.4 oz (55.1 kg)  06/03/21 120 lb 12.8 oz (  54.8 kg)  05/31/21 119 lb 12.8 oz (54.3 kg)     CBC    Component Value Date/Time   WBC 9.0 05/31/2021 1333   WBC 9.1 05/12/2021 1119   RBC 3.91 05/31/2021 1333   HGB 13.0  05/31/2021 1333   HCT 39.0 05/31/2021 1333   PLT 180 05/31/2021 1333   MCV 99.7 05/31/2021 1333   MCH 33.2 05/31/2021 1333   MCHC 33.3 05/31/2021 1333   RDW 13.4 05/31/2021 1333   LYMPHSABS 1.3 05/31/2021 1333   MONOABS 0.6 05/31/2021 1333   EOSABS 0.3 05/31/2021 1333   BASOSABS 0.0 05/31/2021 1333     Chest Imaging: 05/18/2021 CT chest: 2.3 cm right middle lobe pulmonary nodule 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 The patient's images have been independently reviewed by me.    Pulmonary Functions Testing Results: No flowsheet data found.  FeNO:   Pathology:   Echocardiogram:   Heart Catheterization:     Assessment & Plan:     ICD-10-CM   1. Right middle lobe pulmonary nodule  R91.1 Ambulatory referral to Pulmonology    Procedural/ Surgical Case Request: VIDEO BRONCHOSCOPY WITH ENDOBRONCHIAL NAVIGATION, VIDEO BRONCHOSCOPY WITH ENDOBRONCHIAL ULTRASOUND    2. Current smoker  F17.200     3. Abnormal CT of the chest  R93.89     4. Tobacco abuse  Z72.0     5. Deep vein thrombosis (DVT) of other vein of lower extremity, unspecified chronicity, unspecified laterality (HCC)  I82.499     6. On continuous oral anticoagulation  Z79.01       Discussion:  This is an 80 year old female, longstanding history of tobacco abuse, 60-pack-year history, current smoker.  Found to have abnormal CT imaging of the chest concerning for a bronchogenic carcinoma.  She has a 2 cm lesion within the right middle lobe.  Plan: Today in the office we discussed risk benefits alternatives of proceeding with bronchoscopy. She has already met and establish care with medical oncology. She has nuclear medicine PET scan scheduled She also has a brain MRI that was completed today. We discussed risk of bleeding and pneumothorax related to bronchoscopy.  Tentative bronchoscopy date: 06/14/2021  Orders have been placed We appreciate PCC's assistance with scheduling of case today in the  office.    Current Outpatient Medications:    apixaban (ELIQUIS) 5 MG TABS tablet, Take 1 tablet (5 mg total) by mouth 2 (two) times daily., Disp: 60 tablet, Rfl: 2   atorvastatin (LIPITOR) 10 MG tablet, Take 1 tablet (10 mg total) by mouth daily., Disp: 90 tablet, Rfl: 3   calcium carbonate (OSCAL) 1500 (600 Ca) MG TABS tablet, Take 1,200 mg by mouth 2 (two) times daily with a meal. , Disp: , Rfl:    Cholecalciferol (VITAMIN D) 125 MCG (5000 UT) CAPS, Take 5,000 Units by mouth daily. , Disp: , Rfl:    KLOR-CON M20 20 MEQ tablet, TAKE 1 TABLET BY MOUTH EVERY DAY, Disp: 90 tablet, Rfl: 2   lisinopril-hydrochlorothiazide (ZESTORETIC) 20-25 MG tablet, TAKE 1 TABLET BY MOUTH EVERY DAY, Disp: 90 tablet, Rfl: 0   omeprazole (PRILOSEC) 20 MG capsule, TAKE 1 CAPSULE BY MOUTH EVERY DAY, Disp: 90 capsule, Rfl: 1   furosemide (LASIX) 20 MG tablet, Take 1 tablet (20 mg total) by mouth daily for 2 days., Disp: 2 tablet, Rfl: 0  I spent 63 minutes dedicated to the care of this patient on the date of this encounter to include pre-visit review of records, face-to-face time with the patient  discussing conditions above, post visit ordering of testing, clinical documentation with the electronic health record, making appropriate referrals as documented, and communicating necessary findings to members of the patients care team.   Garner Nash, DO [facility] 06/08/2021 1:49 PM

## 2021-06-08 NOTE — Progress Notes (Signed)
Synopsis: Referred in August 2022 for abnormal CT imaging, by Dr. Earlie Server, PCP: By Billie Ruddy, MD  Subjective:   PATIENT ID: Tracy Davidson GENDER: female DOB: [birth year], MRN: [id number]  Chief Complaint  Patient presents with   Consult    Pt is here to discuss bronch with Dr. Valeta Harms.  Pt denies any complaints of cough, SOB, or chest discomfort.     This is an 80 year old female, past medical history of hypertension, GERD, dyslipidemia.College he after having abnormal CT imaging.  Patient was referred to medical CT imaging of the chest was completed on 05/18/2021.  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  Patient was referred here for consideration of bronchoscopy and tissue sampling.  Patient has a PET scan scheduled for 06/15/2021.  Office note from Dr. Earlie Server on 05/31/2021 reviewed.  She is a current smoker 1 pack/day for greater than 60 years.  Patient has no respiratory complaints today.  Patient denies hemoptysis.  Of note was recently diagnosed with an age indeterminant lower extremity clot and started on Eliquis.   Past Medical History:  Diagnosis Date   ALLERGIC RHINITIS 09/20/2009   Allergy    Arthritis    LEGS and left arm   Bladder tumor    Cataract    both eyes, right worse thsn left   COLONIC POLYPS, HX OF 06/06/2007   DYSLIPIDEMIA 09/20/2009   Dyspnea    sob with activity   GERD (gastroesophageal reflux disease)    HYPERTENSION 06/06/2007   Knee pain    stiff and pain on side   LIVER FUNCTION TESTS, ABNORMAL, HX OF    feb or march 2019   Peripheral vascular disease ([place])    Shingles 11/2019   left arm and shoulder     Family History  Problem Relation Age of Onset   Colon cancer Neg Hx    Colon polyps Neg Hx    Esophageal cancer Neg Hx    Rectal cancer Neg Hx    Stomach cancer Neg Hx    Pancreatic cancer Neg Hx    Breast cancer Neg  Hx      Past Surgical History:  Procedure Laterality Date   ABDOMINAL AORTOGRAM W/LOWER EXTREMITY Bilateral 03/03/2020   Procedure: ABDOMINAL AORTOGRAM W/LOWER EXTREMITY;  Surgeon: Marty Heck, MD;  Location: [place] [facility];  Service: Cardiovascular;  Laterality: Bilateral;   BREAST EXCISIONAL BIOPSY Left 1975   COLONOSCOPY  ?2015   CYSTOSCOPY WITH BIOPSY N/A 04/12/2020   Procedure: CYSTOSCOPY WITH BIOPSY  INSTILLATION OF GEMCITABINE;  Surgeon: Lucas Mallow, MD;  Location[facility];  Service: Urology;  Laterality: N/A;   DILATION AND CURETTAGE OF UTERUS  yrs ago   PERIPHERAL VASCULAR INTERVENTION Right 03/03/2020   Procedure: PERIPHERAL VASCULAR INTERVENTION;  Surgeon: Marty Heck, MD;  Location: [place] [facility];  Service: Cardiovascular;  Laterality: Right;   RADIOACTIVE SEED GUIDED EXCISIONAL BREAST BIOPSY Left 02/02/2021   Procedure: RADIOACTIVE SEED GUIDED EXCISIONAL LEFT BREAST BIOPSY;  Surgeon: Stark Klein, MD;  Location: [place];  Service: General;  Laterality: Left;   [place]  yrs ago   complete   TRANSURETHRAL RESECTION OF BLADDER TUMOR N/A 09/18/2018   Procedure: RESTAGE TRANSURETHRAL RESECTION OF BLADDER TUMOR (TURBT);  Surgeon: Lucas Mallow, MD;  Location:  [place];  Service: Urology;  Laterality: N/A;   TRANSURETHRAL RESECTION OF BLADDER TUMOR WITH MITOMYCIN-C N/A 08/16/2018   Procedure: TRANSURETHRAL RESECTION OF BLADDER TUMOR;  Surgeon: Lucas Mallow, MD;  Location: [facility];  Service: Urology;  Laterality: N/A;    Social History   Socioeconomic History   Marital status: Single    Spouse name: Not on file   Number of children: 0   Years of education: 6 years college   Highest education level: Master's degree (e.g., MA, MS, MEng, MEd, MSW, MBA)  Occupational History   Occupation: retired  Tobacco Use   Smoking  status: Every Day    Packs/day: 1.00    Years: 50.00    Pack years: 50.00    Types: Cigarettes    Start date: 1966   Smokeless tobacco: Never   Tobacco comments:    Currently smoking 1ppd as of 06/08/21 ep  Vaping Use   Vaping Use: Never used  Substance and Sexual Activity   Alcohol use: Not Currently    Alcohol/week: 2.0 standard drinks    Types: 2 Shots of liquor per week    Comment: daily alcohol   Drug use: No   Sexual activity: Not on file  Other Topics Concern   Not on file  Social History Narrative   Not on file   Social Determinants of Health   Financial Resource Strain: Low Risk    Difficulty of Paying Living Expenses: Not hard at all  Food Insecurity: No Food Insecurity   Worried About Charity fundraiser in the Last Year: Never true   [place] in the Last Year: Never true  Transportation Needs: No Transportation Needs   Lack of Transportation (Medical): No   Lack of Transportation (Non-Medical): No  Physical Activity: Inactive   Days of Exercise per Week: 0 days   Minutes of Exercise per Session: 0 min  Stress: No Stress Concern Present   Feeling of Stress : Not at all  Social Connections: Moderately Integrated   Frequency of Communication with Friends and Family: More than three times a week   Frequency of Social Gatherings with Friends and Family: Twice a week   Attends Religious Services: More than 4 times per year   Active Member of [employer] or Organizations: Yes   Attends Music therapist: More than 4 times per year   Marital Status: Divorced  Human resources officer Violence: Not At Risk   Fear of Current or Ex-Partner: No   Emotionally Abused: No   Physically Abused: No   Sexually Abused: No     Allergies  Allergen Reactions   Doxycycline Palpitations     Outpatient Medications Prior to Visit  Medication Sig Dispense Refill   apixaban (ELIQUIS) 5 MG TABS tablet Take 1 tablet (5 mg total) by mouth 2 (two) times daily. 60 tablet 2    atorvastatin (LIPITOR) 10 MG tablet Take 1 tablet (10 mg total) by mouth daily. 90 tablet 3   calcium carbonate (OSCAL) 1500 (600 Ca) MG TABS tablet Take 1,200 mg by mouth 2 (two) times daily with a meal.      Cholecalciferol (VITAMIN D) 125 MCG (5000 UT) CAPS Take 5,000 Units by mouth daily.      KLOR-CON M20 20 MEQ tablet TAKE 1 TABLET BY MOUTH EVERY DAY 90 tablet 2   lisinopril-hydrochlorothiazide (ZESTORETIC) 20-25 MG tablet TAKE 1 TABLET BY MOUTH EVERY DAY 90 tablet 0   omeprazole (  PRILOSEC) 20 MG capsule TAKE 1 CAPSULE BY MOUTH EVERY DAY 90 capsule 1   furosemide (LASIX) 20 MG tablet Take 1 tablet (20 mg total) by mouth daily for 2 days. 2 tablet 0   capsicum (ZOSTRIX) 0.075 % topical cream Apply thin film on knee 3-4 times per day as needed 28.3 g 0   triamcinolone ointment (KENALOG) 0.5 % Apply 1 application topically 2 (two) times daily. 30 g 0   No facility-administered medications prior to visit.    Review of Systems  Constitutional:  Negative for chills, fever, malaise/fatigue and weight loss.  HENT:  Negative for hearing loss, sore throat and tinnitus.   Eyes:  Negative for blurred vision and double vision.  Respiratory:  Negative for cough, hemoptysis, sputum production, shortness of breath, wheezing and stridor.   Cardiovascular:  Positive for leg swelling. Negative for chest pain, palpitations, orthopnea and PND.  Gastrointestinal:  Negative for abdominal pain, constipation, diarrhea, heartburn, nausea and vomiting.  Genitourinary:  Negative for dysuria, hematuria and urgency.  Musculoskeletal:  Negative for joint pain and myalgias.  Skin:  Negative for itching and rash.  Neurological:  Negative for dizziness, tingling, weakness and headaches.  Endo/Heme/Allergies:  Negative for environmental allergies. Does not bruise/bleed easily.  Psychiatric/Behavioral:  Negative for depression. The patient is not nervous/anxious and does not have insomnia.   All other systems reviewed  and are negative.   Objective:  Physical Exam Vitals reviewed.  Constitutional:      General: She is not in acute distress.    Appearance: She is well-developed.  HENT:     Head: Normocephalic and atraumatic.  Eyes:     General: No scleral icterus.    Conjunctiva/sclera: Conjunctivae normal.     Pupils: Pupils are equal, round, and reactive to light.  Neck:     Vascular: No JVD.     Trachea: No tracheal deviation.  Cardiovascular:     Rate and Rhythm: Normal rate and regular rhythm.     Heart sounds: Normal heart sounds. No murmur heard. Pulmonary:     Effort: Pulmonary effort is normal. No tachypnea, accessory muscle usage or respiratory distress.     Breath sounds: No stridor. No wheezing, rhonchi or rales.     Comments: Diminished breath sounds bilaterally Abdominal:     General: Bowel sounds are normal. There is no distension.     Palpations: Abdomen is soft.     Tenderness: There is no abdominal tenderness.  Musculoskeletal:        General: No tenderness.     Cervical back: Neck supple.  Lymphadenopathy:     Cervical: No cervical adenopathy.  Skin:    General: Skin is warm and dry.     Capillary Refill: Capillary refill takes less than 2 seconds.     Findings: No rash.  Neurological:     Mental Status: She is alert and oriented to person, place, and time.  Psychiatric:        Behavior: Behavior normal.     Vitals:   06/08/21 1332  BP: 120/68  Pulse: 88  Temp: 98.8 F (37.1 C)  TempSrc: Oral  SpO2: 100%  Weight: 121 lb 6.4 oz (55.1 kg)  Height: '5\' 3"'  (1.6 m)   100% on RA BMI Readings from Last 3 Encounters:  06/08/21 21.51 kg/m  06/03/21 21.40 kg/m  05/31/21 21.22 kg/m   Wt Readings from Last 3 Encounters:  06/08/21 121 lb 6.4 oz (55.1 kg)  06/03/21 120 lb 12.8 oz (  54.8 kg)  05/31/21 119 lb 12.8 oz (54.3 kg)     CBC    Component Value Date/Time   WBC 9.0 05/31/2021 1333   WBC 9.1 05/12/2021 1119   RBC 3.91 05/31/2021 1333   HGB 13.0  05/31/2021 1333   HCT 39.0 05/31/2021 1333   PLT 180 05/31/2021 1333   MCV 99.7 05/31/2021 1333   MCH 33.2 05/31/2021 1333   MCHC 33.3 05/31/2021 1333   RDW 13.4 05/31/2021 1333   LYMPHSABS 1.3 05/31/2021 1333   MONOABS 0.6 05/31/2021 1333   EOSABS 0.3 05/31/2021 1333   BASOSABS 0.0 05/31/2021 1333     Chest Imaging: 05/18/2021 CT chest: 2.3 cm right middle lobe pulmonary nodule 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 The patient's images have been independently reviewed by me.    Pulmonary Functions Testing Results: No flowsheet data found.  FeNO:   Pathology:   Echocardiogram:   Heart Catheterization:     Assessment & Plan:     ICD-10-CM   1. Right middle lobe pulmonary nodule  R91.1 Ambulatory referral to Pulmonology    Procedural/ Surgical Case Request: VIDEO BRONCHOSCOPY WITH ENDOBRONCHIAL NAVIGATION, VIDEO BRONCHOSCOPY WITH ENDOBRONCHIAL ULTRASOUND    2. Current smoker  F17.200     3. Abnormal CT of the chest  R93.89     4. Tobacco abuse  Z72.0     5. Deep vein thrombosis (DVT) of other vein of lower extremity, unspecified chronicity, unspecified laterality (HCC)  I82.499     6. On continuous oral anticoagulation  Z79.01       Discussion:  This is an 80 year old female, longstanding history of tobacco abuse, 60-pack-year history, current smoker.  Found to have abnormal CT imaging of the chest concerning for a bronchogenic carcinoma.  She has a 2 cm lesion within the right middle lobe.  Plan: Today in the office we discussed risk benefits alternatives of proceeding with bronchoscopy. She has already met and establish care with medical oncology. She has nuclear medicine PET scan scheduled She also has a brain MRI that was completed today. We discussed risk of bleeding and pneumothorax related to bronchoscopy.  Tentative bronchoscopy date: 06/14/2021  Orders have been placed We appreciate PCC's assistance with scheduling of case today in the  office.    Current Outpatient Medications:    apixaban (ELIQUIS) 5 MG TABS tablet, Take 1 tablet (5 mg total) by mouth 2 (two) times daily., Disp: 60 tablet, Rfl: 2   atorvastatin (LIPITOR) 10 MG tablet, Take 1 tablet (10 mg total) by mouth daily., Disp: 90 tablet, Rfl: 3   calcium carbonate (OSCAL) 1500 (600 Ca) MG TABS tablet, Take 1,200 mg by mouth 2 (two) times daily with a meal. , Disp: , Rfl:    Cholecalciferol (VITAMIN D) 125 MCG (5000 UT) CAPS, Take 5,000 Units by mouth daily. , Disp: , Rfl:    KLOR-CON M20 20 MEQ tablet, TAKE 1 TABLET BY MOUTH EVERY DAY, Disp: 90 tablet, Rfl: 2   lisinopril-hydrochlorothiazide (ZESTORETIC) 20-25 MG tablet, TAKE 1 TABLET BY MOUTH EVERY DAY, Disp: 90 tablet, Rfl: 0   omeprazole (PRILOSEC) 20 MG capsule, TAKE 1 CAPSULE BY MOUTH EVERY DAY, Disp: 90 capsule, Rfl: 1   furosemide (LASIX) 20 MG tablet, Take 1 tablet (20 mg total) by mouth daily for 2 days., Disp: 2 tablet, Rfl: 0  I spent 63 minutes dedicated to the care of this patient on the date of this encounter to include pre-visit review of records, face-to-face time with the patient  discussing conditions above, post visit ordering of testing, clinical documentation with the electronic health record, making appropriate referrals as documented, and communicating necessary findings to members of the patients care team.   Garner Nash, DO [facility] 06/08/2021 1:49 PM

## 2021-06-08 NOTE — Telephone Encounter (Signed)
I scheduled ENB for 8/30 at 11:30.  Pt will get covid test on 8/26.  I gave appt info to & told her to take last dose of eliquis on 8/27.  She had CT angio at [facility] on 8/3.  I called and spoke to [facility] about making disk.  She states they do have interoffice mail and she is going to check and see how often they pick up and call me back.

## 2021-06-08 NOTE — Telephone Encounter (Signed)
Celeste Dealer at [employer] is going to take the disk to [facility] [facility].

## 2021-06-09 ENCOUNTER — Encounter: Payer: Self-pay | Admitting: Family Medicine

## 2021-06-09 DIAGNOSIS — I700 Atherosclerosis of aorta: Secondary | ICD-10-CM | POA: Insufficient documentation

## 2021-06-09 DIAGNOSIS — I82532 Chronic embolism and thrombosis of left popliteal vein: Secondary | ICD-10-CM | POA: Insufficient documentation

## 2021-06-10 ENCOUNTER — Other Ambulatory Visit: Payer: Self-pay | Admitting: Pulmonary Disease

## 2021-06-11 LAB — SARS CORONAVIRUS 2 (TAT 6-24 HRS): SARS Coronavirus 2: NEGATIVE

## 2021-06-13 ENCOUNTER — Other Ambulatory Visit: Payer: Self-pay

## 2021-06-13 ENCOUNTER — Telehealth: Payer: Self-pay | Admitting: *Deleted

## 2021-06-13 ENCOUNTER — Encounter (HOSPITAL_COMMUNITY): Payer: Self-pay | Admitting: Pulmonary Disease

## 2021-06-13 NOTE — Progress Notes (Signed)
Spoke with pt for pre-op call. Pt denies cardiac history, patient recently started on Eliquis due to a blood clot in lower extremity. She was instructed to hold Eliquis prior to surgery by Dr. Juline Patch office, last dose was her PM dose on 06/11/21.  Pt is not diabetic.   Pt's surgery is scheduled as ambulatory so no Covid test is required prior to surgery.

## 2021-06-13 NOTE — Telephone Encounter (Signed)
I followed up on Tracy Davidson's schedule. Due to appts needing to be changed, I called and spoke to patient.  Patient is getting her PET scan and bx this week.  I re-scheduled her to be seen on 9/6 with Cassie PA and Dr. Julien Nordmann. She verbalized understanding of appt change.

## 2021-06-14 ENCOUNTER — Ambulatory Visit (HOSPITAL_COMMUNITY)
Admission: RE | Admit: 2021-06-14 | Discharge: 2021-06-14 | Disposition: A | Discharge status: Home / Self Care | Payer: Medicare Other | Attending: Pulmonary Disease | Admitting: Pulmonary Disease

## 2021-06-14 ENCOUNTER — Encounter (HOSPITAL_COMMUNITY)
Admission: RE | Disposition: A | Discharge status: Home / Self Care | Payer: Self-pay | Source: Home / Self Care | Attending: Pulmonary Disease

## 2021-06-14 ENCOUNTER — Ambulatory Visit (HOSPITAL_COMMUNITY): Payer: Medicare Other | Admitting: Certified Registered Nurse Anesthetist

## 2021-06-14 ENCOUNTER — Other Ambulatory Visit: Payer: Self-pay

## 2021-06-14 ENCOUNTER — Other Ambulatory Visit (HOSPITAL_COMMUNITY): Payer: Self-pay | Admitting: Pulmonary Disease

## 2021-06-14 ENCOUNTER — Inpatient Hospital Stay: Payer: Medicare Other | Admitting: Internal Medicine

## 2021-06-14 ENCOUNTER — Encounter (HOSPITAL_COMMUNITY): Payer: Self-pay | Admitting: Pulmonary Disease

## 2021-06-14 ENCOUNTER — Ambulatory Visit (HOSPITAL_COMMUNITY): Payer: Medicare Other

## 2021-06-14 DIAGNOSIS — E785 Hyperlipidemia, unspecified: Secondary | ICD-10-CM | POA: Diagnosis not present

## 2021-06-14 DIAGNOSIS — K219 Gastro-esophageal reflux disease without esophagitis: Secondary | ICD-10-CM | POA: Insufficient documentation

## 2021-06-14 DIAGNOSIS — Z86718 Personal history of other venous thrombosis and embolism: Secondary | ICD-10-CM | POA: Diagnosis not present

## 2021-06-14 DIAGNOSIS — C342 Malignant neoplasm of middle lobe, bronchus or lung: Secondary | ICD-10-CM | POA: Diagnosis not present

## 2021-06-14 DIAGNOSIS — Z79899 Other long term (current) drug therapy: Secondary | ICD-10-CM | POA: Insufficient documentation

## 2021-06-14 DIAGNOSIS — F17210 Nicotine dependence, cigarettes, uncomplicated: Secondary | ICD-10-CM | POA: Insufficient documentation

## 2021-06-14 DIAGNOSIS — C3491 Malignant neoplasm of unspecified part of right bronchus or lung: Secondary | ICD-10-CM

## 2021-06-14 DIAGNOSIS — Z7901 Long term (current) use of anticoagulants: Secondary | ICD-10-CM | POA: Diagnosis not present

## 2021-06-14 DIAGNOSIS — Z881 Allergy status to other antibiotic agents status: Secondary | ICD-10-CM | POA: Diagnosis not present

## 2021-06-14 DIAGNOSIS — Z419 Encounter for procedure for purposes other than remedying health state, unspecified: Secondary | ICD-10-CM

## 2021-06-14 DIAGNOSIS — I10 Essential (primary) hypertension: Secondary | ICD-10-CM | POA: Diagnosis not present

## 2021-06-14 DIAGNOSIS — R918 Other nonspecific abnormal finding of lung field: Secondary | ICD-10-CM | POA: Diagnosis not present

## 2021-06-14 DIAGNOSIS — R911 Solitary pulmonary nodule: Secondary | ICD-10-CM | POA: Diagnosis present

## 2021-06-14 HISTORY — PX: BRONCHIAL BRUSHINGS: SHX5108

## 2021-06-14 HISTORY — PX: VIDEO BRONCHOSCOPY WITH ENDOBRONCHIAL ULTRASOUND: SHX6177

## 2021-06-14 HISTORY — PX: BRONCHIAL NEEDLE ASPIRATION BIOPSY: SHX5106

## 2021-06-14 HISTORY — PX: CRYOTHERAPY: SHX6894

## 2021-06-14 HISTORY — PX: HEMOSTASIS CONTROL: SHX6838

## 2021-06-14 HISTORY — PX: BRONCHIAL BIOPSY: SHX5109

## 2021-06-14 SURGERY — BRONCHOSCOPY, WITH EBUS
Anesthesia: General | Laterality: Right

## 2021-06-14 MED ORDER — ONDANSETRON HCL 4 MG/2ML IJ SOLN
INTRAMUSCULAR | Status: DC | PRN
  Administered 2021-06-14: 4 mg via INTRAVENOUS

## 2021-06-14 MED ORDER — FENTANYL CITRATE (PF) 100 MCG/2ML IJ SOLN
INTRAMUSCULAR | Status: DC | PRN
  Administered 2021-06-14 (×2): 25 ug via INTRAVENOUS
  Administered 2021-06-14: 50 ug via INTRAVENOUS

## 2021-06-14 MED ORDER — LIDOCAINE 2% (20 MG/ML) 5 ML SYRINGE
INTRAMUSCULAR | Status: DC | PRN
  Administered 2021-06-14: 20 mg via INTRAVENOUS

## 2021-06-14 MED ORDER — DEXAMETHASONE SODIUM PHOSPHATE 10 MG/ML IJ SOLN
INTRAMUSCULAR | Status: DC | PRN
  Administered 2021-06-14: 8 mg via INTRAVENOUS

## 2021-06-14 MED ORDER — LACTATED RINGERS IV SOLN: INTRAVENOUS | Status: DC

## 2021-06-14 MED ORDER — EPINEPHRINE PF 1 MG/ML IJ SOLN
INTRAMUSCULAR | Status: DC | PRN
  Administered 2021-06-14: 1 mL via ENDOTRACHEOPULMONARY

## 2021-06-14 MED ORDER — ROCURONIUM BROMIDE 10 MG/ML (PF) SYRINGE
PREFILLED_SYRINGE | INTRAVENOUS | Status: DC | PRN
  Administered 2021-06-14: 60 mg via INTRAVENOUS

## 2021-06-14 MED ORDER — PROPOFOL 10 MG/ML IV BOLUS
INTRAVENOUS | Status: DC | PRN
  Administered 2021-06-14: 30 mg via INTRAVENOUS
  Administered 2021-06-14: 120 mg via INTRAVENOUS

## 2021-06-14 MED ORDER — SUGAMMADEX SODIUM 200 MG/2ML IV SOLN
INTRAVENOUS | Status: DC | PRN
  Administered 2021-06-14: 200 mg via INTRAVENOUS

## 2021-06-14 MED ORDER — CHLORHEXIDINE GLUCONATE 0.12 % MT SOLN
15.0000 mL | Freq: Once | OROMUCOSAL | Status: AC
  Administered 2021-06-14: 15 mL via OROMUCOSAL
  Filled 2021-06-14 (×2): qty 15

## 2021-06-14 SURGICAL SUPPLY — 50 items
ADAPTER BRONCH F/PENTAX (ADAPTER) ×4 IMPLANT
ADAPTER VALVE BIOPSY EBUS (MISCELLANEOUS) IMPLANT
ADPTR VALVE BIOPSY EBUS (MISCELLANEOUS)
BRUSH CYTOL CELLEBRITY 1.5X140 (MISCELLANEOUS) ×4 IMPLANT
BRUSH SUPERTRAX BIOPSY (INSTRUMENTS) IMPLANT
BRUSH SUPERTRAX NDL-TIP CYTO (INSTRUMENTS) ×4 IMPLANT
CANISTER SUCT 3000ML PPV (MISCELLANEOUS) ×4 IMPLANT
CHANNEL WORK EXTEND EDGE 180 (KITS) IMPLANT
CHANNEL WORK EXTEND EDGE 45 (KITS) IMPLANT
CHANNEL WORK EXTEND EDGE 90 (KITS) IMPLANT
CONT SPEC 4OZ CLIKSEAL STRL BL (MISCELLANEOUS) ×4 IMPLANT
COVER BACK TABLE 60X90IN (DRAPES) ×4 IMPLANT
COVER DOME SNAP 22 D (MISCELLANEOUS) ×4 IMPLANT
FILTER STRAW FLUID ASPIR (MISCELLANEOUS) IMPLANT
FORCEPS BIOP RJ4 1.8 (CUTTING FORCEPS) IMPLANT
FORCEPS BIOP SUPERTRX PREMAR (INSTRUMENTS) ×4 IMPLANT
GAUZE SPONGE 4X4 12PLY STRL (GAUZE/BANDAGES/DRESSINGS) ×4 IMPLANT
GLOVE BIO SURGEON STRL SZ7.5 (GLOVE) ×4 IMPLANT
GLOVE SURG SS PI 7.5 STRL IVOR (GLOVE) ×8 IMPLANT
GOWN STRL REUS W/ TWL LRG LVL3 (GOWN DISPOSABLE) ×6 IMPLANT
GOWN STRL REUS W/TWL LRG LVL3 (GOWN DISPOSABLE) ×8
KIT CLEAN ENDO COMPLIANCE (KITS) ×8 IMPLANT
KIT LOCATABLE GUIDE (CANNULA) IMPLANT
KIT MARKER FIDUCIAL DELIVERY (KITS) IMPLANT
KIT PROCEDURE EDGE 180 (KITS) IMPLANT
KIT PROCEDURE EDGE 45 (KITS) IMPLANT
KIT PROCEDURE EDGE 90 (KITS) IMPLANT
KIT TURNOVER KIT B (KITS) ×4 IMPLANT
MARKER SKIN DUAL TIP RULER LAB (MISCELLANEOUS) ×4 IMPLANT
NEEDLE EBUS SONO TIP PENTAX (NEEDLE) ×4 IMPLANT
NEEDLE SUPERTRX PREMARK BIOPSY (NEEDLE) ×4 IMPLANT
NS IRRIG 1000ML POUR BTL (IV SOLUTION) ×4 IMPLANT
OIL SILICONE PENTAX (PARTS (SERVICE/REPAIRS)) ×4 IMPLANT
PAD ARMBOARD 7.5X6 YLW CONV (MISCELLANEOUS) ×8 IMPLANT
PATCHES PATIENT (LABEL) ×12 IMPLANT
SOL ANTI FOG 6CC (MISCELLANEOUS) ×3 IMPLANT
SOLUTION ANTI FOG 6CC (MISCELLANEOUS) ×1
SYR 20CC LL (SYRINGE) ×8 IMPLANT
SYR 20ML ECCENTRIC (SYRINGE) ×8 IMPLANT
SYR 50ML SLIP (SYRINGE) ×4 IMPLANT
SYR 5ML LUER SLIP (SYRINGE) ×4 IMPLANT
TOWEL OR 17X24 6PK STRL BLUE (TOWEL DISPOSABLE) ×4 IMPLANT
TRAP SPECIMEN MUCOUS 40CC (MISCELLANEOUS) IMPLANT
TUBE CONNECTING 20X1/4 (TUBING) ×8 IMPLANT
UNDERPAD 30X30 (UNDERPADS AND DIAPERS) ×4 IMPLANT
VALVE BIOPSY  SINGLE USE (MISCELLANEOUS) ×4
VALVE BIOPSY SINGLE USE (MISCELLANEOUS) ×3 IMPLANT
VALVE DISPOSABLE (MISCELLANEOUS) ×4 IMPLANT
VALVE SUCTION BRONCHIO DISP (MISCELLANEOUS) ×4 IMPLANT
WATER STERILE IRR 1000ML POUR (IV SOLUTION) ×4 IMPLANT

## 2021-06-14 NOTE — Transfer of Care (Signed)
Immediate Anesthesia Transfer of Care Note  Patient: Tracy Davidson  Procedure(s) Performed: VIDEO BRONCHOSCOPY WITH ENDOBRONCHIAL ULTRASOUND CRYOTHERAPY BRONCHIAL BRUSHINGS BRONCHIAL BIOPSIES HEMOSTASIS CONTROL BRONCHIAL NEEDLE ASPIRATION BIOPSIES  Patient Location: PACU  Anesthesia Type:General  Level of Consciousness: awake, alert  and oriented  Airway & Oxygen Therapy: Patient Spontanous Breathing and Patient connected to face mask oxygen  Post-op Assessment: Report given to RN and Post -op Vital signs reviewed and stable  Post vital signs: Reviewed and stable  Last Vitals:  Vitals Value Taken Time  BP 172/84   Temp    Pulse 98 06/14/21 1312  Resp 19 06/14/21 1312  SpO2 100 % 06/14/21 1312  Vitals shown include unvalidated device data.  Last Pain:  Vitals:   06/14/21 0934  TempSrc:   PainSc: 5       Patients Stated Pain Goal: 3 (87/21/58 [id number])  Complications: No notable events documented.

## 2021-06-14 NOTE — Anesthesia Preprocedure Evaluation (Addendum)
Anesthesia Evaluation  Patient identified by MRN, date of birth, ID band Patient awake    Reviewed: Allergy & Precautions, NPO status , Patient's Chart, lab work & pertinent test results  History of Anesthesia Complications Negative for: history of anesthetic complications  Airway Mallampati: II  TM Distance: >3 FB Neck ROM: Full    Dental  (+) Poor Dentition, Chipped, Dental Advisory Given, Missing   Pulmonary shortness of breath, Current SmokerPatient did not abstain from smoking.,  06/10/2021 SARS coronavirus NEG   breath sounds clear to auscultation       Cardiovascular hypertension, Pt. on medications (-) angina+ Peripheral Vascular Disease   Rhythm:Regular Rate:Normal  '21 ECHO: EF 60 to 65%. The LV has normal function, no regional wall motion abnormalities. There is moderate left ventricular hypertrophy. No significant valvular abnormalities   Neuro/Psych negative neurological ROS     GI/Hepatic Neg liver ROS, GERD  Medicated and Controlled,  Endo/Other  negative endocrine ROS  Renal/GU negative Renal ROS   Bladder tumor    Musculoskeletal   Abdominal   Peds  Hematology eliquis   Anesthesia Other Findings   Reproductive/Obstetrics                            Anesthesia Physical Anesthesia Plan  ASA: 3  Anesthesia Plan: General   Post-op Pain Management:    Induction: Intravenous  PONV Risk Score and Plan: 2 and Ondansetron, Dexamethasone and Treatment may vary due to age or medical condition  Airway Management Planned: Oral ETT  Additional Equipment: None  Intra-op Plan:   Post-operative Plan: Extubation in OR  Informed Consent: I have reviewed the patients History and Physical, chart, labs and discussed the procedure including the risks, benefits and alternatives for the proposed anesthesia with the patient or authorized representative who has indicated his/her  understanding and acceptance.     Dental advisory given  Plan Discussed with: CRNA and Surgeon  Anesthesia Plan Comments:        Anesthesia Quick Evaluation

## 2021-06-14 NOTE — Op Note (Signed)
Video bronchoscopy with cryotherapy, tumor debulking, lesional excision Video Bronchoscopy with Endobronchial Ultrasound Procedure Note  Date of Operation: 06/14/2021  Pre-op Diagnosis: Right middle lobe mass  Post-op Diagnosis: Right middle lobe mass  Surgeon: Garner Nash, DO  Assistants: None  Anesthesia: General endotracheal anesthesia  Operation: Flexible video fiberoptic bronchoscopy with endobronchial ultrasound and biopsies.  Estimated Blood Loss: Minimal  Complications: None  Indications and History: Tracy Davidson is a 80 y.o. female with right middle lobe mass.  The risks, benefits, complications, treatment options and expected outcomes were discussed with the patient.  The possibilities of pneumothorax, pneumonia, reaction to medication, pulmonary aspiration, perforation of a viscus, bleeding, failure to diagnose a condition and creating a complication requiring transfusion or operation were discussed with the patient who freely signed the consent.    Description of Procedure: The patient was examined in the preoperative area and history and data from the preprocedure consultation were reviewed. It was deemed appropriate to proceed.  The patient was taken to [facility] endoscopy room 3, identified as [name] and the procedure verified as Flexible Video Fiberoptic Bronchoscopy.  A Time Out was held and the above information confirmed. After being taken to the operating room general anesthesia was initiated and the patient  was orally intubated.   The video fiberoptic bronchoscope was introduced via the endotracheal tube and a general inspection was performed which showed visible gray tumor fully occluding the right middle lobe opening.  Initially after first seeing the lesion within the right middle lobe it was felt to be mucus and proteinaceous material.  However as we started to pull pieces of the lesion out with forceps it appeared to be more tissue dense texture.  And  then would start to bleed. We also took samples of this area with a brush. We then switched to the 1.7 mm cryotherapy probe.  We completed tumor debulking, endobronchial cryo biopsies and lesional excision.  We pulled pieces of tumor with en bloc retraction from the right middle lobe opening.  This tumor fed from a what appeared to be pedunculated base all the way into the lateral segment of the right middle lobe.  We used I saline and epinephrine for hemostasis.  Tissue was collected for pathology and cytology evaluation.  The standard scope was then withdrawn and the endobronchial ultrasound was used to identify and characterize the peritracheal, hilar and bronchial lymph nodes. Inspection showed no obvious nodal structure within the right hilum or subcarinal space however we were able to push the curvilinear endobronchial ultrasound scope into the right middle lobe and look towards the medial border of the heart.  We were able to visualize the mass within the right middle lobe.. Using real-time ultrasound guidance Wang needle biopsies were take from the right middle lobe mass and were sent for cytology. The patient tolerated the procedure well without apparent complications. There was no significant blood loss. The bronchoscope was withdrawn. Anesthesia was reversed and the patient was taken to the PACU for recovery.   Samples: 1.  Right middle lobe endobronchial cryobiopsies 2.  Right middle lobe brushings 3.  Right middle lobe forcep biopsies 4.  Endobronchial ultrasound transbronchial needle aspirations of the right middle lobe mass  Plans:  The patient will be discharged from the PACU to home when recovered from anesthesia. We will review the cytology, pathology and microbiology results with the patient when they become available. Outpatient followup will be with Garner Nash, DO.   Garner Nash,  DO [facility] 06/14/2021 1:20 PM

## 2021-06-14 NOTE — Discharge Instructions (Signed)
PLEASE RESTART ELIQUIS ON 06/15/2021   Flexible Bronchoscopy, Care After This sheet gives you information about how to care for yourself after your test. Your doctor may also give you more specific instructions. If you have problems or questions, contact your doctor. Follow these instructions at home: Eating and drinking Do not eat or drink anything (not even water) for 2 hours after your test, or until your numbing medicine (local anesthetic) wears off. When your numbness is gone and your cough and gag reflexes have come back, you may: Eat only soft foods. Slowly drink liquids. The day after the test, go back to your normal diet. Driving Do not drive for 24 hours if you were given a medicine to help you relax (sedative). Do not drive or use heavy machinery while taking prescription pain medicine. General instructions  Take over-the-counter and prescription medicines only as told by your doctor. Return to your normal activities as told. Ask what activities are safe for you. Do not use any products that have nicotine or tobacco in them. This includes cigarettes and e-cigarettes. If you need help quitting, ask your doctor. Keep all follow-up visits as told by your doctor. This is important. It is very important if you had a tissue sample (biopsy) taken. Get help right away if: You have shortness of breath that gets worse. You get light-headed. You feel like you are going to pass out (faint). You have chest pain. You cough up: More than a little blood. More blood than before. Summary Do not eat or drink anything (not even water) for 2 hours after your test, or until your numbing medicine wears off. Do not use cigarettes. Do not use e-cigarettes. Get help right away if you have chest pain.  This information is not intended to replace advice given to you by your health care provider. Make sure you discuss any questions you have with your health care provider. Document Released:  07/30/2009 Document Revised: 09/14/2017 Document Reviewed: 10/20/2016 Elsevier Patient Education  2020 [employer].

## 2021-06-14 NOTE — Anesthesia Procedure Notes (Signed)
Procedure Name: Intubation Date/Time: 06/14/2021 11:48 AM Performed by: Genelle Bal, CRNA Pre-anesthesia Checklist: Patient identified, Emergency Drugs available, Suction available and Patient being monitored Patient Re-evaluated:Patient Re-evaluated prior to induction Oxygen Delivery Method: Circle system utilized Preoxygenation: Pre-oxygenation with 100% oxygen Induction Type: IV induction Ventilation: Mask ventilation without difficulty Laryngoscope Size: Miller and 2 Grade View: Grade I Tube type: Oral Tube size: 8.5 mm Number of attempts: 1 Airway Equipment and Method: Patient positioned with wedge pillow and Stylet Placement Confirmation: ETT inserted through vocal cords under direct vision, positive ETCO2 and breath sounds checked- equal and bilateral Secured at: 21 cm Tube secured with: Tape Dental Injury: Teeth and Oropharynx as per pre-operative assessment

## 2021-06-14 NOTE — Anesthesia Postprocedure Evaluation (Signed)
Anesthesia Post Note  Patient: DAWNETTE MIONE  Procedure(s) Performed: VIDEO BRONCHOSCOPY WITH ENDOBRONCHIAL ULTRASOUND CRYOTHERAPY BRONCHIAL BRUSHINGS BRONCHIAL BIOPSIES HEMOSTASIS CONTROL BRONCHIAL NEEDLE ASPIRATION BIOPSIES     Patient location during evaluation: PACU Anesthesia Type: General Level of consciousness: awake and alert, patient cooperative and oriented Pain management: pain level controlled Vital Signs Assessment: post-procedure vital signs reviewed and stable Respiratory status: spontaneous breathing, nonlabored ventilation, respiratory function stable and patient connected to nasal cannula oxygen Cardiovascular status: blood pressure returned to baseline and stable Postop Assessment: no apparent nausea or vomiting Anesthetic complications: no   No notable events documented.  Last Vitals:  Vitals:   06/14/21 1330 06/14/21 1345  BP: (!) 156/64 (!) 145/72  Pulse: 82 75  Resp:  17  Temp:  (!) 36.4 C  SpO2: 100% 99%    Last Pain:  Vitals:   06/14/21 1345  TempSrc:   PainSc: 0-No pain                 Kassity Woodson,E. Adalyn Pennock

## 2021-06-14 NOTE — Interval H&P Note (Signed)
History and Physical Interval Note:  06/14/2021 11:26 AM  Tracy Davidson  has presented today for surgery, with the diagnosis of lung nodule.  The various methods of treatment have been discussed with the patient and family. After consideration of risks, benefits and other options for treatment, the patient has consented to  Procedure(s) with comments: [place] (Right) - ION VIDEO BRONCHOSCOPY WITH ENDOBRONCHIAL ULTRASOUND (N/A) as a surgical intervention.  The patient's history has been reviewed, patient examined, no change in status, stable for surgery.  I have reviewed the patient's chart and labs.  Questions were answered to the patient's satisfaction.     [place]

## 2021-06-15 ENCOUNTER — Encounter (HOSPITAL_COMMUNITY)
Admission: RE | Admit: 2021-06-15 | Discharge: 2021-06-15 | Disposition: A | Discharge status: Home / Self Care | Payer: Medicare Other | Source: Ambulatory Visit | Attending: Internal Medicine | Admitting: Internal Medicine

## 2021-06-15 DIAGNOSIS — I2510 Atherosclerotic heart disease of native coronary artery without angina pectoris: Secondary | ICD-10-CM | POA: Diagnosis not present

## 2021-06-15 DIAGNOSIS — K7689 Other specified diseases of liver: Secondary | ICD-10-CM | POA: Insufficient documentation

## 2021-06-15 DIAGNOSIS — N6489 Other specified disorders of breast: Secondary | ICD-10-CM | POA: Insufficient documentation

## 2021-06-15 DIAGNOSIS — I700 Atherosclerosis of aorta: Secondary | ICD-10-CM | POA: Insufficient documentation

## 2021-06-15 DIAGNOSIS — M47816 Spondylosis without myelopathy or radiculopathy, lumbar region: Secondary | ICD-10-CM | POA: Insufficient documentation

## 2021-06-15 DIAGNOSIS — C3490 Malignant neoplasm of unspecified part of unspecified bronchus or lung: Secondary | ICD-10-CM

## 2021-06-15 DIAGNOSIS — J189 Pneumonia, unspecified organism: Secondary | ICD-10-CM | POA: Insufficient documentation

## 2021-06-15 LAB — GLUCOSE, CAPILLARY: Glucose-Capillary: 118 mg/dL — ABNORMAL HIGH (ref 70–99)

## 2021-06-15 MED ORDER — FLUDEOXYGLUCOSE F - 18 (FDG) INJECTION
6.5000 | Freq: Once | INTRAVENOUS | Status: AC | PRN
  Administered 2021-06-15: 6 via INTRAVENOUS

## 2021-06-16 ENCOUNTER — Encounter (HOSPITAL_COMMUNITY): Payer: Self-pay | Admitting: Pulmonary Disease

## 2021-06-16 NOTE — Progress Notes (Signed)
[place] OFFICE PROGRESS NOTE  Tracy Ruddy, MD [address] [place] [address]  DIAGNOSIS:  Stage IIB (T3, N0, M0) non-small cell lung cancer, favoring adenocarcinoma.  The patient presented with a right middle lobe central lung mass in addition to a suspicious right middle lobe and right lower lobe pulmonary nodules.  She was diagnosed in August 2022.  PRIOR THERAPY: None  CURRENT THERAPY: None  INTERVAL HISTORY: Tracy Davidson 80 y.o. female returns to the clinic today for a follow-up visit accompanied by her friend.  The patient was recently found to have suspicious lung cancer but required tissue biopsy.  The patient underwent a bronchoscopy under the care of Dr. Valeta Harms on 06/14/2021 which the pathology was consistent with non-small cell lung cancer, favoring adenocarcinoma.   Overall, the patient is feeling fairly well today.  She denies any fever, chills, night sweats, or unexplained weight loss. Her appetite comes and goes. She drinks ensures about 2x per week.  She denies any chest pain, shortness of breath. She has a baseline cough. Last week, she had some blood tinged sputum before and after her bronchoscopy but none at this time. She denies any nausea, vomiting, diarrhea, or constipation.  She denies any headache or visual changes. She continues to smoke cigarettes. She has nicotine patches and lozenges but she has not used them yet. The patient recently completed the staging work-up with a PET scan and a brain MRI.  The patient is here today for evaluation for a more detailed discussion about her current condition and recommended treatment options.    MEDICAL HISTORY: Past Medical History:  Diagnosis Date   ALLERGIC RHINITIS 09/20/2009   Allergy    Arthritis    LEGS and left arm   Bladder tumor    Cataract    both eyes, right worse thsn left   COLONIC POLYPS, HX OF 06/06/2007   DYSLIPIDEMIA 09/20/2009   Dyspnea    sob with activity   GERD  (gastroesophageal reflux disease)    HYPERTENSION 06/06/2007   Knee pain    stiff and pain on side   LIVER FUNCTION TESTS, ABNORMAL, HX OF    feb or march 2019   Peripheral vascular disease ([place])    Shingles 11/2019   left arm and shoulder    ALLERGIES:  is allergic to doxycycline.  MEDICATIONS:  Current Outpatient Medications  Medication Sig Dispense Refill   apixaban (ELIQUIS) 5 MG TABS tablet Take 1 tablet (5 mg total) by mouth 2 (two) times daily. 60 tablet 2   atorvastatin (LIPITOR) 10 MG tablet Take 1 tablet (10 mg total) by mouth daily. 90 tablet 3   calcium carbonate (OSCAL) 1500 (600 Ca) MG TABS tablet Take 1,500 mg by mouth 2 (two) times daily with a meal.     Cholecalciferol (VITAMIN D) 125 MCG (5000 UT) CAPS Take 5,000 Units by mouth daily.      KLOR-CON M20 20 MEQ tablet TAKE 1 TABLET BY MOUTH EVERY DAY 90 tablet 2   lisinopril-hydrochlorothiazide (ZESTORETIC) 20-25 MG tablet TAKE 1 TABLET BY MOUTH EVERY DAY 90 tablet 0   Melatonin 5 MG CAPS Take 5 mg by mouth at bedtime as needed (sleep).     omeprazole (PRILOSEC) 20 MG capsule TAKE 1 CAPSULE BY MOUTH EVERY DAY 90 capsule 1   No current facility-administered medications for this visit.    SURGICAL HISTORY:  Past Surgical History:  Procedure Laterality Date   ABDOMINAL AORTOGRAM W/LOWER EXTREMITY Bilateral  03/03/2020   Procedure: ABDOMINAL AORTOGRAM W/LOWER EXTREMITY;  Surgeon: Marty Heck, MD;  Location: [facility];  Service: Cardiovascular;  Laterality: Bilateral;   BREAST EXCISIONAL BIOPSY Left 1975   BRONCHIAL BIOPSY  06/14/2021   Procedure: BRONCHIAL BIOPSIES;  Surgeon: Garner Nash, DO;  Location: [facility];  Service: Pulmonary;;   BRONCHIAL BRUSHINGS  06/14/2021   Procedure: BRONCHIAL BRUSHINGS;  Surgeon: Garner Nash, DO;  Location: [facility];  Service: Pulmonary;;   BRONCHIAL NEEDLE ASPIRATION BIOPSY  06/14/2021   Procedure: BRONCHIAL NEEDLE ASPIRATION BIOPSIES;  Surgeon: Garner Nash, DO;  Location: [facility];  Service: Pulmonary;;   COLONOSCOPY  ?2015   CRYOTHERAPY  06/14/2021   Procedure: CRYOTHERAPY;  Surgeon: Garner Nash, DO;  Location: [facility];  Service: Pulmonary;;   CYSTOSCOPY WITH BIOPSY N/A 04/12/2020   Procedure: CYSTOSCOPY WITH BIOPSY  INSTILLATION OF GEMCITABINE;  Surgeon: Lucas Mallow, MD;  Location: [facility];  Service: Urology;  Laterality: N/A;   DILATION AND CURETTAGE OF UTERUS  yrs ago   HEMOSTASIS CONTROL  06/14/2021   Procedure: HEMOSTASIS CONTROL;  Surgeon: Garner Nash, DO;  Location: [facility];  Service: Pulmonary;;   PERIPHERAL VASCULAR INTERVENTION Right 03/03/2020   Procedure: PERIPHERAL VASCULAR INTERVENTION;  Surgeon: Marty Heck, MD;  Location: [facility];  Service: Cardiovascular;  Laterality: Right;   RADIOACTIVE SEED GUIDED EXCISIONAL BREAST BIOPSY Left 02/02/2021   Procedure: RADIOACTIVE SEED GUIDED EXCISIONAL LEFT BREAST BIOPSY;  Surgeon: Stark Klein, MD;  Location: [facility];  Service: General;  Laterality: Left;   [place]  yrs ago   complete   TRANSURETHRAL RESECTION OF BLADDER TUMOR N/A 09/18/2018   Procedure: RESTAGE TRANSURETHRAL RESECTION OF BLADDER TUMOR (TURBT);  Surgeon: Lucas Mallow, MD;  Location: [facility];  Service: Urology;  Laterality: N/A;   TRANSURETHRAL RESECTION OF BLADDER TUMOR WITH MITOMYCIN-C N/A 08/16/2018   Procedure: TRANSURETHRAL RESECTION OF BLADDER TUMOR;  Surgeon: Lucas Mallow, MD;  Location: [facility];  Service: Urology;  Laterality: N/A;   VIDEO BRONCHOSCOPY WITH ENDOBRONCHIAL ULTRASOUND N/A 06/14/2021   Procedure: VIDEO BRONCHOSCOPY WITH ENDOBRONCHIAL ULTRASOUND;  Surgeon: Garner Nash, DO;  Location: [facility];  Service: Pulmonary;  Laterality: N/A;    REVIEW OF SYSTEMS:   Review of Systems  Constitutional: Negative for appetite  change, chills, fatigue, fever and unexpected weight change.  HENT: Negative for mouth sores, nosebleeds, sore throat and trouble swallowing.   Eyes: Negative for eye problems and icterus.  Respiratory: Positive for cough and hemoptysis. Negative for shortness of breath and wheezing.   Cardiovascular: Negative for chest pain and leg swelling.  Gastrointestinal: Negative for abdominal pain, constipation, diarrhea, nausea and vomiting.  Genitourinary: Negative for bladder incontinence, difficulty urinating, dysuria, frequency and hematuria.   Musculoskeletal: Negative for back pain, gait problem, neck pain and neck stiffness.  Skin: Negative for itching and rash.  Neurological: Negative for dizziness, extremity weakness, gait problem, headaches, light-headedness and seizures.  Hematological: Negative for adenopathy. Does not bruise/bleed easily.  Psychiatric/Behavioral: Negative for confusion, depression and sleep disturbance. The patient is not nervous/anxious.     PHYSICAL EXAMINATION:  Blood pressure 134/71, pulse 78, temperature (!) 97.3 F (36.3 C), temperature source Oral, resp. rate 17, weight 121 lb 6.4 oz (55.1 kg), SpO2 100 %.  ECOG PERFORMANCE STATUS: 1  Physical Exam  Constitutional: Oriented to person, place, and time  and well-developed, well-nourished, and in no distress.  HENT:  Head: Normocephalic and atraumatic.  Mouth/Throat: Oropharynx is clear and moist. No oropharyngeal exudate.  Eyes: Conjunctivae are normal. Right eye exhibits no discharge. Left eye exhibits no discharge. No scleral icterus.  Neck: Normal range of motion. Neck supple.  Cardiovascular: Normal rate, regular rhythm, normal heart sounds and intact distal pulses.   Pulmonary/Chest: Effort normal and breath sounds normal, slightly decreased breath sounds in right lung. No respiratory distress. No wheezes. No rales.  Abdominal: Soft. Bowel sounds are normal. Exhibits no distension and no mass. There is no  tenderness.  Musculoskeletal: Normal range of motion. Exhibits no edema.  Lymphadenopathy:    No cervical adenopathy.  Neurological: Alert and oriented to person, place, and time. Exhibits normal muscle tone. Gait normal. Coordination normal.  Skin: Skin is warm and dry. No rash noted. Not diaphoretic. No erythema. No pallor.  Psychiatric: Mood, memory and judgment normal.  Vitals reviewed.  LABORATORY DATA: Lab Results  Component Value Date   WBC 8.5 06/21/2021   HGB 12.2 06/21/2021   HCT 37.5 06/21/2021   MCV 100.5 (H) 06/21/2021   PLT 188 06/21/2021      Chemistry      Component Value Date/Time   NA 143 06/21/2021 0952   K 3.9 06/21/2021 0952   CL 103 06/21/2021 0952   CO2 28 06/21/2021 0952   BUN 11 06/21/2021 0952   CREATININE 0.78 06/21/2021 0952      Component Value Date/Time   CALCIUM 10.4 (H) 06/21/2021 0952   ALKPHOS 68 06/21/2021 0952   AST 22 06/21/2021 0952   ALT 15 06/21/2021 0952   BILITOT 0.8 06/21/2021 0952       RADIOGRAPHIC STUDIES:  MR BRAIN W WO CONTRAST  Result Date: 06/08/2021 CLINICAL DATA:  Non-small cell lung cancer, staging. EXAM: MRI HEAD WITHOUT AND WITH CONTRAST TECHNIQUE: Multiplanar, multiecho pulse sequences of the brain and surrounding structures were obtained without and with intravenous contrast. CONTRAST:  58mL GADAVIST GADOBUTROL 1 MMOL/ML IV SOLN COMPARISON:  None. FINDINGS: Brain: Diffusion imaging does not show any acute or subacute infarction or other cause of restricted diffusion. The brainstem and cerebellum are normal. Cerebral hemispheres show moderate chronic small-vessel ischemic changes of the white matter, often seen at this age. No cortical or large vessel territory infarction. No evidence of primary or metastatic mass lesion, hemorrhage, hydrocephalus or extra-axial collection. There is a small venous angioma in the left frontal lobe. Vascular: Major vessels at the base of the brain show flow. Skull and upper cervical  spine: No skull or skull base lesion is seen. There is some abnormal signal on the left at the C3 and C4 level that I think probably relates to degenerative osteophytes. Consider plain radiographic correlation. Sinuses/Orbits: Clear/normal Other: None IMPRESSION: Moderate chronic small-vessel ischemic changes of the cerebral hemispheric white matter. No acute intracranial insult. No evidence of metastatic disease to the brain or leptomeninges. Some abnormal signal along the left side of the L3 and L4 vertebral bodies that I think probably relates to degenerative osteophyte formation. Metastatic disease is less likely. Consider plain radiographic correlation. Electronically Signed   By: Nelson Chimes M.D.   On: 06/08/2021 11:48   NM PET Image Initial (PI) Skull Base To Thigh (F-18 FDG)  Result Date: 06/17/2021 CLINICAL DATA:  Initial treatment strategy for lung nodule. EXAM: NUCLEAR MEDICINE PET SKULL BASE TO THIGH TECHNIQUE: 6.0 mCi F-18 FDG was injected intravenously. Full-ring PET imaging was performed from the  skull base to thigh after the radiotracer. CT data was obtained and used for attenuation correction and anatomic localization. Fasting blood glucose: 118 mg/dl COMPARISON:  CT chest 05/18/2021 FINDINGS: Mediastinal blood pool activity: SUV max 2.1 Liver activity: SUV max NA NECK: The abnormal signal along the left side of the C3-4 vertebral levels on MRI of 06/07/2021 does not correspond to abnormal accentuated metabolic activity does appear to correspond to substantial spurring in this vicinity, and accordingly is thought to be benign. There is scattered activity in hypermetabolic brown fat in the posterior spare spinal region and extending into the upper shoulder region and lower anterior neck region. No abnormal findings along these foci of accentuated activity to suggest malignancy. Incidental CT findings: Mild bilateral common carotid atherosclerotic calcification. CHEST: The dominant medial right  middle lobe nodule measures about 2.4 cm in diameter and has a maximum SUV of 8.4, compatible with malignancy. Ground-glass density distal to this lesion likely from postobstructive pneumonitis. 6 by 4 mm right middle lobe nodule on image 37 series 10 has a maximum SUV of 0.9, but is below sensitive PET-CT size thresholds. Scattered foci accentuated activity in the mediastinum, medial intercostal regions, periaortic region do not have corresponding CT findings and are ascribed to hypermetabolic brown fat. This is also present in the shoulder and lower neck region. Asymmetric density in the medial retroareolar region of the left breast measuring about 2.0 by 1.1 cm on image 80 series 4, maximum SUV 2.9. 0.6 cm left axillary lymph node, maximum SUV 3.9. 0.6 cm right axillary lymph node with maximum SUV 4.3. Incidental CT findings: Coronary, aortic arch, and branch vessel atherosclerotic vascular disease. ABDOMEN/PELVIS: A right inguinal lymph node measuring 0.5 cm in diameter on image 170 series 4 has maximum SUV of 2.8. Incidental CT findings: Photopenic hepatic cysts. Atherosclerosis is present, including aortoiliac atherosclerotic disease. Scattered sigmoid colon diverticula. SKELETON: No significant abnormal hypermetabolic activity in this region. Incidental CT findings: Old healed right pelvic fracture. Lower lumbar spondylosis and degenerative disc disease. Grade 1 anterolisthesis at L4-5. IMPRESSION: 1. Hypermetabolic 2.4 cm right middle lobe nodule, maximum SUV 8.4, compatible with malignancy. Adjacent postobstructive pneumonitis. 2. A 6 by 4 mm right middle lobe nodule is not hypermetabolic but is below sensitive PET-CT size thresholds. 3. No compelling findings of active metastatic disease; sensitivity for small metastatic lesions is reduced due to extensive multifocal hypermetabolic brown fat in the neck and chest which causes scattered spurious high uptake. 4. Central lower inner quadrant hypermetabolic  activity in the left breast associated with asymmetric density. This may be a reflection of recent workup with surgical excision of intraductal papilloma documented on prior exams from earlier this year. 5. Comment was made on prior MRI of signal along the left side of the C3-4 vertebral level; this appears to represent benign spurring and has no hypermetabolic activity on today's PET-CT. 6. Other imaging findings of potential clinical significance: Aortic Atherosclerosis (ICD10-I70.0). Coronary atherosclerosis. Photopenic hepatic cysts. Lumbar spondylosis and degenerative disc disease. Electronically Signed   By: Van Clines M.D.   On: 06/17/2021 07:53     ASSESSMENT/PLAN:  This is a very pleasant 80 year old African-American female with likely stage IIB (T3, N0, M0) non-small cell lung cancer, favoring adenocarcinoma.   She presented with a right middle lobe central lung mass in addition to suspicious right middle lobe and right lower lobe pulmonary nodules. She was diagnosed in August 2022.    The patient recently completed the staging work-up with a brain MRI  as well as a PET scan.  The patient was seen with Dr. Julien Nordmann today.  Dr. Julien Nordmann personally and independently reviewed the scan and discussed the results with the patient today.  The scan showed Hypermetabolic 2.4 cm right middle lobe nodule, maximum SUV 8.4, compatible with malignancy. Adjacent postobstructive pneumonitis. The other pulmonary nodule is below the PET-CT sensitivity.   Dr. Julien Nordmann had a lengthy discussion with the patient today about her current condition and recommended treatment options.  The area is centrally located. Given this, Dr. Julien Nordmann is not sure if the patient is a surgical candidate but we will bring her to [facility] to be evaluated by the cardiothoracic surgeon as well as radiation oncology to make the determination. If she is not a surgical candidate, then we will refer her to radiation for SBRT. I will place the  order for PFTs.    Dr. Julien Nordmann strongly encouraged the patient to quit smoking. She has lozenges and nicotine patches.   We will see her back for a follow up visit in 1 month for evaluation and to arrange close monitoring.   The patient was advised to call immediately if she has any concerning symptoms in the interval. The patient voices understanding of current disease status and treatment options and is in agreement with the current care plan. All questions were answered. The patient knows to call the clinic with any problems, questions or concerns. We can certainly see the patient much sooner if necessary   Orders Placed This Encounter  Procedures   Ambulatory referral to Cardiothoracic Surgery    Referral Priority:   Routine    Referral Type:   Surgical    Referral Reason:   Specialty Services Required    Requested Specialty:   Cardiothoracic Surgery    Number of Visits Requested:   1   Pulmonary Function Test    Standing Status:   Future    Standing Expiration Date:   06/21/2022    Order Specific Question:   Where should this test be performed?    Answer:   [facility]    Order Specific Question:   Full PFT: includes the following: basic spirometry, spirometry pre & post bronchodilator, diffusion capacity (DLCO), lung volumes    Answer:   Full PFT      Lasaro Primm L Lizzet Hendley, PA-C 06/21/21  ADDENDUM: Hematology/Oncology Attending: I had a face-to-face encounter with the patient today.  I reviewed her record, lab and scan and recommended her care plan.  This is a very pleasant 80 years old African-American female recently diagnosed with a stage IIb (T3, N0, M0) non-small cell lung cancer, adenocarcinoma presented with right middle lobe central lung mass with suspicious right middle lobe and right lower lobe pulmonary pulmonary nodules that are not clear on the PET scan.  The patient had full staging work-up including a PET scan as well as MRI of the brain performed recently.  I  personally and independently reviewed the imaging studies and discussed the result and showed the images to the patient and her friend today. The patient also underwent bronchoscopy under the care of Dr. Valeta Harms and the final pathology was consistent with adenocarcinoma of lung primary. I recommended for the patient pulmonary function test and I will also refer her to see both cardiothoracic surgery and radiation oncology for evaluation and treatment of her condition.  If the patient is not a surgical candidate, she may benefit from curative radiotherapy to this lesion. For the smoking cessation I strongly encouraged  the patient to quit smoking. We will see her back for follow-up visit in 1 months for evaluation and further recommendation regarding her condition after her evaluation by surgery and radiation oncology. The patient was advised to call immediately if she has any other concerning symptoms in the interval. Disclaimer: This note was dictated with voice recognition software. Similar sounding words can inadvertently be transcribed and may be missed upon review. The total time spent in the appointment was 30 minutes. Eilleen Kempf, MD 06/21/21

## 2021-06-17 LAB — CYTOLOGY - NON PAP

## 2021-06-17 NOTE — Progress Notes (Signed)
I tried calling to give path results. No answer. If she calls back let her know.   Path + NSCLC. She has appt with onc next week and [place] for follow up.   Thanks,  BLI  Garner Nash, DO [facility] 06/17/2021 5:12 PM

## 2021-06-21 ENCOUNTER — Other Ambulatory Visit: Payer: Self-pay

## 2021-06-21 ENCOUNTER — Inpatient Hospital Stay: Payer: Medicare Other | Attending: Physician Assistant | Admitting: Physician Assistant

## 2021-06-21 ENCOUNTER — Inpatient Hospital Stay: Payer: Medicare Other

## 2021-06-21 ENCOUNTER — Encounter: Payer: Self-pay | Admitting: *Deleted

## 2021-06-21 VITALS — BP 134/71 | HR 78 | Temp 97.3°F | Resp 17 | Wt 121.4 lb

## 2021-06-21 DIAGNOSIS — C3431 Malignant neoplasm of lower lobe, right bronchus or lung: Secondary | ICD-10-CM | POA: Insufficient documentation

## 2021-06-21 DIAGNOSIS — R911 Solitary pulmonary nodule: Secondary | ICD-10-CM

## 2021-06-21 DIAGNOSIS — C342 Malignant neoplasm of middle lobe, bronchus or lung: Secondary | ICD-10-CM | POA: Insufficient documentation

## 2021-06-21 DIAGNOSIS — Z8719 Personal history of other diseases of the digestive system: Secondary | ICD-10-CM | POA: Insufficient documentation

## 2021-06-21 DIAGNOSIS — C3491 Malignant neoplasm of unspecified part of right bronchus or lung: Secondary | ICD-10-CM | POA: Insufficient documentation

## 2021-06-21 DIAGNOSIS — Z720 Tobacco use: Secondary | ICD-10-CM

## 2021-06-21 DIAGNOSIS — Z79899 Other long term (current) drug therapy: Secondary | ICD-10-CM | POA: Diagnosis not present

## 2021-06-21 DIAGNOSIS — Z7901 Long term (current) use of anticoagulants: Secondary | ICD-10-CM | POA: Diagnosis not present

## 2021-06-21 DIAGNOSIS — D1802 Hemangioma of intracranial structures: Secondary | ICD-10-CM | POA: Insufficient documentation

## 2021-06-21 DIAGNOSIS — F17210 Nicotine dependence, cigarettes, uncomplicated: Secondary | ICD-10-CM | POA: Diagnosis not present

## 2021-06-21 DIAGNOSIS — C3490 Malignant neoplasm of unspecified part of unspecified bronchus or lung: Secondary | ICD-10-CM

## 2021-06-21 LAB — CBC WITH DIFFERENTIAL (CANCER CENTER ONLY)
Abs Immature Granulocytes: 0.02 10*3/uL (ref 0.00–0.07)
Basophils Absolute: 0 10*3/uL (ref 0.0–0.1)
Basophils Relative: 0 %
Eosinophils Absolute: 0.4 10*3/uL (ref 0.0–0.5)
Eosinophils Relative: 5 %
HCT: 37.5 % (ref 36.0–46.0)
Hemoglobin: 12.2 g/dL (ref 12.0–15.0)
Immature Granulocytes: 0 %
Lymphocytes Relative: 13 %
Lymphs Abs: 1.1 10*3/uL (ref 0.7–4.0)
MCH: 32.7 pg (ref 26.0–34.0)
MCHC: 32.5 g/dL (ref 30.0–36.0)
MCV: 100.5 fL — ABNORMAL HIGH (ref 80.0–100.0)
Monocytes Absolute: 0.6 10*3/uL (ref 0.1–1.0)
Monocytes Relative: 7 %
Neutro Abs: 6.4 10*3/uL (ref 1.7–7.7)
Neutrophils Relative %: 75 %
Platelet Count: 188 10*3/uL (ref 150–400)
RBC: 3.73 MIL/uL — ABNORMAL LOW (ref 3.87–5.11)
RDW: 13.2 % (ref 11.5–15.5)
WBC Count: 8.5 10*3/uL (ref 4.0–10.5)
nRBC: 0 % (ref 0.0–0.2)

## 2021-06-21 LAB — CMP (CANCER CENTER ONLY)
ALT: 15 U/L (ref 0–44)
AST: 22 U/L (ref 15–41)
Albumin: 4 g/dL (ref 3.5–5.0)
Alkaline Phosphatase: 68 U/L (ref 38–126)
Anion gap: 12 (ref 5–15)
BUN: 11 mg/dL (ref 8–23)
CO2: 28 mmol/L (ref 22–32)
Calcium: 10.4 mg/dL — ABNORMAL HIGH (ref 8.9–10.3)
Chloride: 103 mmol/L (ref 98–111)
Creatinine: 0.78 mg/dL (ref 0.44–1.00)
GFR, Estimated: >60 mL/min (ref >60)
Glucose, Bld: 95 mg/dL (ref 70–99)
Potassium: 3.9 mmol/L (ref 3.5–5.1)
Sodium: 143 mmol/L (ref 135–145)
Total Bilirubin: 0.8 mg/dL (ref 0.3–1.2)
Total Protein: 8 g/dL (ref 6.5–8.1)

## 2021-06-21 NOTE — Progress Notes (Signed)
I received an update from [facility] with Dr. Julien Nordmann.  Patient has been referred to thoracic surgery for evaluation.  I notified their office of referral.

## 2021-06-21 NOTE — Patient Instructions (Addendum)
Summary:  -There are two main categories of lung cancer, they are named based on the size of the cancer cell. One is called Non-Small cell lung cancer. The other type is Small Cell Lung Cancer -The sample (biopsy) that they took of your tumor was consistent with a subtype of Non-small cell lung cancer called Adenocarcinoma. This is the most common type of lung cancer.  -We covered a lot of important information at your appointment today regarding what the treatment plan is moving forward. Here are the the main points that were discussed at your office visit with [place] today:  -Your treatment will be either surgery or radiation. We will bring you to the clinic next week to see both the surgeon and the radiation doctor.  -It is helpful for the surgeon to have you perform lung function tests. This helps them determine if you would be a candidate for surgery. Someone from the lung function testing department may call you for an appointment. Please be on the lookout for a phone call.  -Dr. Julien Nordmann will see you back in about 1 month. By that time, we would have already got you connected with surgery or radiation.   Please work on quitting smoking.   Referrals or Imaging: -Radiation  -Surgery  Follow up:  -We will see you back for a follow up visit 1 month. But you will likely have an appointment here next week to see the surgeon or radiation doctor.   -If you need to reach [place] at any time, the main office number to the cancer center is [phone], when you call, ask to speak to either Cassie's or Dr. Worthy Flank nurse.

## 2021-06-22 ENCOUNTER — Telehealth: Payer: Self-pay | Admitting: Internal Medicine

## 2021-06-22 ENCOUNTER — Ambulatory Visit: Payer: Medicare Other | Admitting: Pulmonary Disease

## 2021-06-22 ENCOUNTER — Encounter: Payer: Self-pay | Admitting: Pulmonary Disease

## 2021-06-22 VITALS — BP 130/70 | HR 70 | Temp 97.5°F | Ht 63.0 in | Wt 122.4 lb

## 2021-06-22 DIAGNOSIS — Z7901 Long term (current) use of anticoagulants: Secondary | ICD-10-CM | POA: Diagnosis not present

## 2021-06-22 DIAGNOSIS — Z720 Tobacco use: Secondary | ICD-10-CM

## 2021-06-22 DIAGNOSIS — C3491 Malignant neoplasm of unspecified part of right bronchus or lung: Secondary | ICD-10-CM | POA: Diagnosis not present

## 2021-06-22 DIAGNOSIS — R911 Solitary pulmonary nodule: Secondary | ICD-10-CM | POA: Diagnosis not present

## 2021-06-22 DIAGNOSIS — R9389 Abnormal findings on diagnostic imaging of other specified body structures: Secondary | ICD-10-CM

## 2021-06-22 DIAGNOSIS — F17200 Nicotine dependence, unspecified, uncomplicated: Secondary | ICD-10-CM | POA: Diagnosis not present

## 2021-06-22 NOTE — Patient Instructions (Addendum)
Thank you for visiting Dr. Valeta Harms at [facility]. Today we recommend the following:  PFTs prior to possible surgery evaluation   Return in about 6 months (around 12/20/2021).    Please do your part to reduce the spread of COVID-19.

## 2021-06-22 NOTE — Telephone Encounter (Signed)
Scheduled appt per 9/6 los - unable to reach pt. Left message and mailed letter with appt date and time

## 2021-06-22 NOTE — Progress Notes (Signed)
Synopsis: Referred in August 2022 for abnormal CT imaging, by Dr. Earlie Server, PCP: By Billie Ruddy, MD  Subjective:   PATIENT ID: Tracy Davidson GENDER: female DOB: 1940-12-12, MRN: [id number]  Chief Complaint  Patient presents with   Follow-up    Lung nodule f/u     This is an 80 year old female, past medical history of hypertension, GERD, dyslipidemia.College he after having abnormal CT imaging.  Patient was referred to medical CT imaging of the chest was completed on 05/18/2021.  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  Patient was referred here for consideration of bronchoscopy and tissue sampling.  Patient has a PET scan scheduled for 06/15/2021.  Office note from Dr. Earlie Server on 05/31/2021 reviewed.  She is a current smoker 1 pack/day for greater than 60 years.  Patient has no respiratory complaints today.  Patient denies hemoptysis.  Of note was recently diagnosed with an age indeterminant lower extremity clot and started on Eliquis.  OV 06/22/2021: Here today for follow-up after recent bronchoscopy.Patient had a right middle lobe obstructing lesion.  This was debulked and removed back to a base located within the right middle lobe subsegment.  Patient's pathology was consistent with non-small cell carcinoma favor adenocarcinoma immunohistochemistry profile.  She has subsequently followed up with medical oncology.  Office note 06/21/2021 reviewed today.  Patient has a stage IIb, T3, N0, M0 non-small cell lung cancer.  Current plan is to discuss at medical thoracic oncology conference for consideration of surgical resection versus SBRT.   Past Medical History:  Diagnosis Date   ALLERGIC RHINITIS 09/20/2009   Allergy    Arthritis    LEGS and left arm   Bladder tumor    Cataract    both eyes, right worse thsn left   COLONIC POLYPS, HX OF 06/06/2007   DYSLIPIDEMIA  09/20/2009   Dyspnea    sob with activity   GERD (gastroesophageal reflux disease)    HYPERTENSION 06/06/2007   Knee pain    stiff and pain on side   LIVER FUNCTION TESTS, ABNORMAL, HX OF    feb or march 2019   Peripheral vascular disease ([place])    Shingles 11/2019   left arm and shoulder     Family History  Problem Relation Age of Onset   Colon cancer Neg Hx    Colon polyps Neg Hx    Esophageal cancer Neg Hx    Rectal cancer Neg Hx    Stomach cancer Neg Hx    Pancreatic cancer Neg Hx    Breast cancer Neg Hx      Past Surgical History:  Procedure Laterality Date   ABDOMINAL AORTOGRAM W/LOWER EXTREMITY Bilateral 03/03/2020   Procedure: ABDOMINAL AORTOGRAM W/LOWER EXTREMITY;  Surgeon: Marty Heck, MD;  Location: [facility];  Service: Cardiovascular;  Laterality: Bilateral;   BREAST EXCISIONAL BIOPSY Left 1975   BRONCHIAL BIOPSY  06/14/2021   Procedure: BRONCHIAL BIOPSIES;  Surgeon: Garner Nash, DO;  Location: [facility];  Service: Pulmonary;;   BRONCHIAL BRUSHINGS  06/14/2021   Procedure: BRONCHIAL BRUSHINGS;  Surgeon: Garner Nash, DO;  Location: [facility];  Service: Pulmonary;;   BRONCHIAL NEEDLE ASPIRATION BIOPSY  06/14/2021   Procedure: BRONCHIAL NEEDLE ASPIRATION BIOPSIES;  Surgeon: Garner Nash, DO;  Location: [facility];  Service: Pulmonary;;   COLONOSCOPY  ?2015   CRYOTHERAPY  06/14/2021  Procedure: CRYOTHERAPY;  Surgeon: Garner Nash, DO;  Location: [facility];  Service: Pulmonary;;   CYSTOSCOPY WITH BIOPSY N/A 04/12/2020   Procedure: CYSTOSCOPY WITH BIOPSY  INSTILLATION OF GEMCITABINE;  Surgeon: Lucas Mallow, MD;  Location: [facility];  Service: Urology;  Laterality: N/A;   DILATION AND CURETTAGE OF UTERUS  yrs ago   HEMOSTASIS CONTROL  06/14/2021   Procedure: HEMOSTASIS CONTROL;  Surgeon: Garner Nash, DO;  Location: [facility];  Service: Pulmonary;;   PERIPHERAL VASCULAR INTERVENTION Right 03/03/2020    Procedure: PERIPHERAL VASCULAR INTERVENTION;  Surgeon: Marty Heck, MD;  Location: [facility];  Service: Cardiovascular;  Laterality: Right;   RADIOACTIVE SEED GUIDED EXCISIONAL BREAST BIOPSY Left 02/02/2021   Procedure: RADIOACTIVE SEED GUIDED EXCISIONAL LEFT BREAST BIOPSY;  Surgeon: Stark Klein, MD;  Location: [facility];  Service: General;  Laterality: Left;   [place]  yrs ago   complete   TRANSURETHRAL RESECTION OF BLADDER TUMOR N/A 09/18/2018   Procedure: RESTAGE TRANSURETHRAL RESECTION OF BLADDER TUMOR (TURBT);  Surgeon: Lucas Mallow, MD;  Location: [facility];  Service: Urology;  Laterality: N/A;   TRANSURETHRAL RESECTION OF BLADDER TUMOR WITH MITOMYCIN-C N/A 08/16/2018   Procedure: TRANSURETHRAL RESECTION OF BLADDER TUMOR;  Surgeon: Lucas Mallow, MD;  Location: [facility];  Service: Urology;  Laterality: N/A;   VIDEO BRONCHOSCOPY WITH ENDOBRONCHIAL ULTRASOUND N/A 06/14/2021   Procedure: VIDEO BRONCHOSCOPY WITH ENDOBRONCHIAL ULTRASOUND;  Surgeon: Garner Nash, DO;  Location: [facility] [facility];  Service: Pulmonary;  Laterality: N/A;    Social History   Socioeconomic History   Marital status: Single    Spouse name: Not on file   Number of children: 0   Years of education: 6 years college   Highest education level: Master's degree (e.g., MA, MS, MEng, MEd, MSW, MBA)  Occupational History   Occupation: retired  Tobacco Use   Smoking status: Every Day    Packs/day: 1.00    Years: 50.00    Pack years: 50.00    Types: Cigarettes    Start date: 1966   Smokeless tobacco: Never   Tobacco comments:    Currently smoking 1ppd as of 06/08/21 ep  Vaping Use   Vaping Use: Never used  Substance and Sexual Activity   Alcohol use: Not Currently    Alcohol/week: 2.0 standard drinks    Types: 2 Shots of liquor per week    Comment: daily alcohol   Drug use: No   Sexual  activity: Not on file  Other Topics Concern   Not on file  Social History Narrative   Not on file   Social Determinants of Health   Financial Resource Strain: Low Risk    Difficulty of Paying Living Expenses: Not hard at all  Food Insecurity: No Food Insecurity   Worried About Charity fundraiser in the Last Year: Never true   [place] in the Last Year: Never true  Transportation Needs: No Transportation Needs   Lack of Transportation (Medical): No   Lack of Transportation (Non-Medical): No  Physical Activity: Inactive   Days of Exercise per Week: 0 days   Minutes of Exercise per Session: 0 min  Stress: No Stress Concern Present   Feeling of Stress : Not at all  Social Connections: Moderately Integrated   Frequency of Communication with Friends and Family: More than three times a week  Frequency of Social Gatherings with Friends and Family: Twice a week   Attends Religious Services: More than 4 times per year   Active Member of [employer] or Organizations: Yes   Attends Music therapist: More than 4 times per year   Marital Status: Divorced  Human resources officer Violence: Not At Risk   Fear of Current or Ex-Partner: No   Emotionally Abused: No   Physically Abused: No   Sexually Abused: No     Allergies  Allergen Reactions   Doxycycline Palpitations     Outpatient Medications Prior to Visit  Medication Sig Dispense Refill   apixaban (ELIQUIS) 5 MG TABS tablet Take 1 tablet (5 mg total) by mouth 2 (two) times daily. 60 tablet 2   atorvastatin (LIPITOR) 10 MG tablet Take 1 tablet (10 mg total) by mouth daily. 90 tablet 3   calcium carbonate (OSCAL) 1500 (600 Ca) MG TABS tablet Take 1,500 mg by mouth 2 (two) times daily with a meal.     Cholecalciferol (VITAMIN D) 125 MCG (5000 UT) CAPS Take 5,000 Units by mouth daily.      KLOR-CON M20 20 MEQ tablet TAKE 1 TABLET BY MOUTH EVERY DAY 90 tablet 2   lisinopril-hydrochlorothiazide (ZESTORETIC) 20-25 MG tablet TAKE 1  TABLET BY MOUTH EVERY DAY 90 tablet 0   Melatonin 5 MG CAPS Take 5 mg by mouth at bedtime as needed (sleep).     omeprazole (PRILOSEC) 20 MG capsule TAKE 1 CAPSULE BY MOUTH EVERY DAY 90 capsule 1   No facility-administered medications prior to visit.    Review of Systems  Constitutional:  Negative for chills, fever, malaise/fatigue and weight loss.  HENT:  Negative for hearing loss, sore throat and tinnitus.   Eyes:  Negative for blurred vision and double vision.  Respiratory:  Negative for cough, hemoptysis, sputum production, shortness of breath, wheezing and stridor.   Cardiovascular:  Negative for chest pain, palpitations, orthopnea, leg swelling and PND.  Gastrointestinal:  Negative for abdominal pain, constipation, diarrhea, heartburn, nausea and vomiting.  Genitourinary:  Negative for dysuria, hematuria and urgency.  Musculoskeletal:  Negative for joint pain and myalgias.  Skin:  Negative for itching and rash.  Neurological:  Negative for dizziness, tingling, weakness and headaches.  Endo/Heme/Allergies:  Negative for environmental allergies. Does not bruise/bleed easily.  Psychiatric/Behavioral:  Negative for depression. The patient is not nervous/anxious and does not have insomnia.   All other systems reviewed and are negative.   Objective:  Physical Exam Vitals reviewed.  Constitutional:      General: She is not in acute distress.    Appearance: She is well-developed.  HENT:     Head: Normocephalic and atraumatic.  Eyes:     General: No scleral icterus.    Conjunctiva/sclera: Conjunctivae normal.     Pupils: Pupils are equal, round, and reactive to light.  Neck:     Vascular: No JVD.     Trachea: No tracheal deviation.  Cardiovascular:     Rate and Rhythm: Normal rate and regular rhythm.     Heart sounds: Normal heart sounds. No murmur heard. Pulmonary:     Effort: Pulmonary effort is normal. No tachypnea, accessory muscle usage or respiratory distress.     Breath  sounds: No stridor. No wheezing, rhonchi or rales.  Abdominal:     General: Bowel sounds are normal. There is no distension.     Palpations: Abdomen is soft.     Tenderness: There is no abdominal tenderness.  Musculoskeletal:  General: No tenderness.     Cervical back: Neck supple.  Lymphadenopathy:     Cervical: No cervical adenopathy.  Skin:    General: Skin is warm and dry.     Capillary Refill: Capillary refill takes less than 2 seconds.     Findings: No rash.  Neurological:     Mental Status: She is alert and oriented to person, place, and time.  Psychiatric:        Behavior: Behavior normal.     Vitals:   06/22/21 1128  BP: 130/70  Pulse: 70  Temp: (!) 97.5 F (36.4 C)  TempSrc: Oral  SpO2: 98%  Weight: 122 lb 6.4 oz (55.5 kg)  Height: 5\' 3"  (1.6 m)   98% on RA BMI Readings from Last 3 Encounters:  06/22/21 21.68 kg/m  06/21/21 21.51 kg/m  06/14/21 21.26 kg/m   Wt Readings from Last 3 Encounters:  06/22/21 122 lb 6.4 oz (55.5 kg)  06/21/21 121 lb 6.4 oz (55.1 kg)  06/14/21 120 lb (54.4 kg)     CBC    Component Value Date/Time   WBC 8.5 06/21/2021 0952   WBC 9.1 05/12/2021 1119   RBC 3.73 (L) 06/21/2021 0952   HGB 12.2 06/21/2021 0952   HCT 37.5 06/21/2021 0952   PLT 188 06/21/2021 0952   MCV 100.5 (H) 06/21/2021 0952   MCH 32.7 06/21/2021 0952   MCHC 32.5 06/21/2021 0952   RDW 13.2 06/21/2021 0952   LYMPHSABS 1.1 06/21/2021 0952   MONOABS 0.6 06/21/2021 0952   EOSABS 0.4 06/21/2021 0952   BASOSABS 0.0 06/21/2021 0952     Chest Imaging: 05/18/2021 CT chest: 2.3 cm right middle lobe pulmonary nodule 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 The patient's images have been independently reviewed by me.    Pulmonary Functions Testing Results: No flowsheet data found.  FeNO:   Pathology:   Echocardiogram:   Heart Catheterization:     Assessment & Plan:     ICD-10-CM   1. Non-small cell lung cancer, right (HCC)  C34.91     2.  Right middle lobe pulmonary nodule  R91.1 Pulmonary Function Test    3. Current smoker  F17.200     4. Abnormal CT of the chest  R93.89     5. Tobacco abuse  Z72.0     6. On continuous oral anticoagulation  Z79.01       Discussion:   This is a 80 year old female, longstanding history of tobacco abuse, 60-pack-year history, current smoker, found to have an abnormal CT with a right middle lobe 2 cm lesion.  Patient was taken for bronchoscopy which had a papillary/pedunculated lesion coming out of the right middle lobe obstructing.  Patient underwent cryo therapy, tumor debulking from this region.  Plan: Biopsies were consistent with non-small cell lung cancer. She is already seen oncology. PFTs pending Will discuss case at medical thoracic oncology conference this week. She is 80 years old.  We will discuss SBRT versus surgery. Patient to see me in clinic 4 to 6 months.    Current Outpatient Medications:    apixaban (ELIQUIS) 5 MG TABS tablet, Take 1 tablet (5 mg total) by mouth 2 (two) times daily., Disp: 60 tablet, Rfl: 2   atorvastatin (LIPITOR) 10 MG tablet, Take 1 tablet (10 mg total) by mouth daily., Disp: 90 tablet, Rfl: 3   calcium carbonate (OSCAL) 1500 (600 Ca) MG TABS tablet, Take 1,500 mg by mouth 2 (two) times daily with a meal., Disp: , Rfl:  Cholecalciferol (VITAMIN D) 125 MCG (5000 UT) CAPS, Take 5,000 Units by mouth daily. , Disp: , Rfl:    KLOR-CON M20 20 MEQ tablet, TAKE 1 TABLET BY MOUTH EVERY DAY, Disp: 90 tablet, Rfl: 2   lisinopril-hydrochlorothiazide (ZESTORETIC) 20-25 MG tablet, TAKE 1 TABLET BY MOUTH EVERY DAY, Disp: 90 tablet, Rfl: 0   Melatonin 5 MG CAPS, Take 5 mg by mouth at bedtime as needed (sleep)., Disp: , Rfl:    omeprazole (PRILOSEC) 20 MG capsule, TAKE 1 CAPSULE BY MOUTH EVERY DAY, Disp: 90 capsule, Rfl: 1    Garner Nash, DO [facility] 06/22/2021 11:59 AM

## 2021-06-24 ENCOUNTER — Ambulatory Visit (INDEPENDENT_AMBULATORY_CARE_PROVIDER_SITE_OTHER): Payer: Medicare Other | Admitting: Pulmonary Disease

## 2021-06-24 ENCOUNTER — Other Ambulatory Visit: Payer: Self-pay

## 2021-06-24 DIAGNOSIS — R911 Solitary pulmonary nodule: Secondary | ICD-10-CM | POA: Diagnosis not present

## 2021-06-24 LAB — PULMONARY FUNCTION TEST
DL/VA % pred: 99 %
DL/VA: 4.08 ml/min/mmHg/L
DLCO cor % pred: 77 %
DLCO cor: 14.13 ml/min/mmHg
DLCO unc % pred: 74 %
DLCO unc: 13.58 ml/min/mmHg
FEF 25-75 Post: 0.76 L/sec
FEF 25-75 Pre: 1.03 L/sec
FEF2575-%Change-Post: -26 %
FEF2575-%Pred-Post: 61 %
FEF2575-%Pred-Pre: 83 %
FEV1-%Change-Post: -4 %
FEV1-%Pred-Post: 103 %
FEV1-%Pred-Pre: 108 %
FEV1-Post: 1.52 L
FEV1-Pre: 1.59 L
FEV1FVC-%Change-Post: -1 %
FEV1FVC-%Pred-Pre: 95 %
FEV6-%Change-Post: -3 %
FEV6-%Pred-Post: 117 %
FEV6-%Pred-Pre: 121 %
FEV6-Post: 2.14 L
FEV6-Pre: 2.21 L
FEV6FVC-%Pred-Post: 104 %
FEV6FVC-%Pred-Pre: 104 %
FVC-%Change-Post: -3 %
FVC-%Pred-Post: 111 %
FVC-%Pred-Pre: 115 %
FVC-Post: 2.14 L
FVC-Pre: 2.21 L
Post FEV1/FVC ratio: 71 %
Post FEV6/FVC ratio: 100 %
Pre FEV1/FVC ratio: 72 %
Pre FEV6/FVC Ratio: 100 %
RV % pred: 106 %
RV: 2.5 L
TLC % pred: 94 %
TLC: 4.64 L

## 2021-06-24 NOTE — Patient Instructions (Signed)
Full PFT performed today. °

## 2021-06-24 NOTE — Progress Notes (Signed)
Full PFT performed today. °

## 2021-07-05 ENCOUNTER — Encounter: Payer: Medicare Other | Admitting: Thoracic Surgery (Cardiothoracic Vascular Surgery)

## 2021-07-08 ENCOUNTER — Other Ambulatory Visit: Payer: Self-pay

## 2021-07-08 ENCOUNTER — Institutional Professional Consult (permissible substitution): Payer: Medicare Other | Admitting: Thoracic Surgery (Cardiothoracic Vascular Surgery)

## 2021-07-08 VITALS — BP 155/78 | HR 90 | Resp 20 | Ht 63.0 in | Wt 122.0 lb

## 2021-07-08 DIAGNOSIS — C3491 Malignant neoplasm of unspecified part of right bronchus or lung: Secondary | ICD-10-CM

## 2021-07-08 NOTE — Progress Notes (Signed)
PCP is Billie Ruddy, MD Referring Provider is Heilingoetter, Cassandr*  Chief Complaint  Patient presents with   Lung Lesion    Surgical consult, Chest CTA 05/18/21, PET Scan 06/15/21, MR Brain 06/07/21, PFT's 06/24/21, Bronch/EBUS 06/14/21     HPI: Tracy Davidson is sent for consultation regarding a right middle lobe mass  Tracy Davidson is an 80 year old woman with a history of hypertension, PAD, claudication, DVT, bladder tumor, arthritis, hyperlipidemia, abnormal liver function test, shingles, bladder tumor and ongoing tobacco abuse.  She is a very poor historian.  She cannot remember why she was initially evaluated, but the chart indicates that she was having cough and shortness of breath.  Prior to that she had had some left leg pain and swelling.  She had a duplex which showed a left popliteal DVT.  She then had a CT angiogram of the chest.  It showed no PE but there was a 2.6 cm right middle lobe perihilar mass abutting the pleura and pericardium with obstruction of the middle lobe bronchus.  She was referred to Dr. Julien Nordmann.  A PET CT showed the nodule was hypermetabolic.  There was no evidence of regional or distant metastatic disease.  However there was extensive hypermetabolism that was attributed to brown fat.  MRI of the brain was negative for metastatic disease.  She was referred to Dr. Valeta Harms.  He did bronchoscopy and EBUS.  Bronchoscopy she was found to have tumor extending to the origin of the middle lobe bronchus.  He debrided that back with a cryoprobe.  With endobronchial ultrasound there was no hilar or subcarinal adenopathy.  He was able to aspirate the mass.  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  She denies any weight loss.  She is very quiet.  I really was not able to get any handle on her exercise tolerance, but it seems to be more limited by claudication than cardiac or respiratory issues.  She denies chest pain, pressure, or tightness.  She also denies leg  swelling although it is documented in her chart from about 2 months ago.  She lives at home alone.  She does not have help for postop care.  Zubrod Score: At the time of surgery this patient's most appropriate activity status/level should be described as: []     0    Normal activity, no symptoms [x]     1    Restricted in physical strenuous activity but ambulatory, able to do out light work []     2    Ambulatory and capable of self care, unable to do work activities, up and about >50 % of waking hours                              []     3    Only limited self care, in bed greater than 50% of waking hours []     4    Completely disabled, no self care, confined to bed or chair []     5    Moribund  Past Medical History:  Diagnosis Date   ALLERGIC RHINITIS 09/20/2009   Allergy    Arthritis    LEGS and left arm   Bladder tumor    Cataract    both eyes, right worse thsn left   COLONIC POLYPS, HX OF 06/06/2007   DYSLIPIDEMIA 09/20/2009   Dyspnea    sob with activity   GERD (gastroesophageal reflux disease)    HYPERTENSION  06/06/2007   Knee pain    stiff and pain on side   LIVER FUNCTION TESTS, ABNORMAL, HX OF    feb or march 2019   Peripheral vascular disease ([place])    Shingles 11/2019   left arm and shoulder    Past Surgical History:  Procedure Laterality Date   ABDOMINAL AORTOGRAM W/LOWER EXTREMITY Bilateral 03/03/2020   Procedure: ABDOMINAL AORTOGRAM W/LOWER EXTREMITY;  Surgeon: Marty Heck, MD;  Location: [facility];  Service: Cardiovascular;  Laterality: Bilateral;   BREAST EXCISIONAL BIOPSY Left 1975   BRONCHIAL BIOPSY  06/14/2021   Procedure: BRONCHIAL BIOPSIES;  Surgeon: Garner Nash, DO;  Location: [facility];  Service: Pulmonary;;   BRONCHIAL BRUSHINGS  06/14/2021   Procedure: BRONCHIAL BRUSHINGS;  Surgeon: Garner Nash, DO;  Location: [facility];  Service: Pulmonary;;   BRONCHIAL NEEDLE ASPIRATION BIOPSY  06/14/2021   Procedure: BRONCHIAL NEEDLE  ASPIRATION BIOPSIES;  Surgeon: Garner Nash, DO;  Location: [facility];  Service: Pulmonary;;   COLONOSCOPY  ?2015   CRYOTHERAPY  06/14/2021   Procedure: CRYOTHERAPY;  Surgeon: Garner Nash, DO;  Location: [facility];  Service: Pulmonary;;   CYSTOSCOPY WITH BIOPSY N/A 04/12/2020   Procedure: CYSTOSCOPY WITH BIOPSY  INSTILLATION OF GEMCITABINE;  Surgeon: Lucas Mallow, MD;  Location: [facility];  Service: Urology;  Laterality: N/A;   DILATION AND CURETTAGE OF UTERUS  yrs ago   HEMOSTASIS CONTROL  06/14/2021   Procedure: HEMOSTASIS CONTROL;  Surgeon: Garner Nash, DO;  Location: [place];  Service: Pulmonary;;   PERIPHERAL VASCULAR INTERVENTION Right 03/03/2020   Procedure: PERIPHERAL VASCULAR INTERVENTION;  Surgeon: Marty Heck, MD;  Location: [facility];  Service: Cardiovascular;  Laterality: Right;   RADIOACTIVE SEED GUIDED EXCISIONAL BREAST BIOPSY Left 02/02/2021   Procedure: RADIOACTIVE SEED GUIDED EXCISIONAL LEFT BREAST BIOPSY;  Surgeon: Stark Klein, MD;  Location: [facility];  Service: General;  Laterality: Left;   [place]  yrs ago   complete   TRANSURETHRAL RESECTION OF BLADDER TUMOR N/A 09/18/2018   Procedure: RESTAGE TRANSURETHRAL RESECTION OF BLADDER TUMOR (TURBT);  Surgeon: Lucas Mallow, MD;  [facility];  Service: Urology;  Laterality: N/A;   TRANSURETHRAL RESECTION OF BLADDER TUMOR WITH MITOMYCIN-C N/A 08/16/2018   Procedure: TRANSURETHRAL RESECTION OF BLADDER TUMOR;  Surgeon: Lucas Mallow, MD;  [facility];  Service: Urology;  Laterality: N/A;   VIDEO BRONCHOSCOPY WITH ENDOBRONCHIAL ULTRASOUND N/A 06/14/2021   Procedure: VIDEO BRONCHOSCOPY WITH ENDOBRONCHIAL ULTRASOUND;  Surgeon: Garner Nash, DO;  Location: [place] [facility];  Service: Pulmonary;  Laterality: N/A;    Family History  Problem Relation Age of  Onset   Colon cancer Neg Hx    Colon polyps Neg Hx    Esophageal cancer Neg Hx    Rectal cancer Neg Hx    Stomach cancer Neg Hx    Pancreatic cancer Neg Hx    Breast cancer Neg Hx     Social History Social History   Tobacco Use   Smoking status: Every Day    Packs/day: 1.00    Years: 50.00    Pack years: 50.00    Types: Cigarettes    Start date: 1966   Smokeless tobacco: Never   Tobacco comments:    Currently smoking 1ppd as of 06/08/21 ep  Vaping Use   Vaping Use: Never used  Substance Use Topics  Alcohol use: Not Currently    Alcohol/week: 2.0 standard drinks    Types: 2 Shots of liquor per week    Comment: daily alcohol   Drug use: No    Current Outpatient Medications  Medication Sig Dispense Refill   apixaban (ELIQUIS) 5 MG TABS tablet Take 1 tablet (5 mg total) by mouth 2 (two) times daily. 60 tablet 2   atorvastatin (LIPITOR) 10 MG tablet Take 1 tablet (10 mg total) by mouth daily. 90 tablet 3   calcium carbonate (OSCAL) 1500 (600 Ca) MG TABS tablet Take 1,500 mg by mouth 2 (two) times daily with a meal.     Cholecalciferol (VITAMIN D) 125 MCG (5000 UT) CAPS Take 5,000 Units by mouth daily.      KLOR-CON M20 20 MEQ tablet TAKE 1 TABLET BY MOUTH EVERY DAY 90 tablet 2   lisinopril-hydrochlorothiazide (ZESTORETIC) 20-25 MG tablet TAKE 1 TABLET BY MOUTH EVERY DAY 90 tablet 0   Melatonin 5 MG CAPS Take 5 mg by mouth at bedtime as needed (sleep).     omeprazole (PRILOSEC) 20 MG capsule TAKE 1 CAPSULE BY MOUTH EVERY DAY 90 capsule 1   No current facility-administered medications for this visit.    Allergies  Allergen Reactions   Doxycycline Palpitations    Review of Systems  Constitutional:  Positive for activity change and appetite change. Negative for unexpected weight change.  HENT:  Negative for trouble swallowing and voice change.   Respiratory:  Positive for cough. Negative for shortness of breath and wheezing.   Cardiovascular:  Negative for chest pain  and leg swelling (See HPI).       Claudication  Gastrointestinal:  Negative for abdominal distention and abdominal pain.  Musculoskeletal:  Positive for arthralgias and gait problem.  Skin:  Positive for rash.  Hematological:  Does not bruise/bleed easily.   BP (!) 155/78   Pulse 90   Resp 20   Ht 5\' 3"  (1.6 m)   Wt 122 lb (55.3 kg)   SpO2 96% Comment: RA  BMI 21.61 kg/m  Physical Exam Vitals reviewed.  Constitutional:      Comments: Thin  HENT:     Head: Normocephalic and atraumatic.  Eyes:     General: No scleral icterus.    Extraocular Movements: Extraocular movements intact.  Cardiovascular:     Rate and Rhythm: Normal rate and regular rhythm.     Heart sounds: Normal heart sounds. No murmur heard.   No friction rub. No gallop.  Pulmonary:     Effort: Pulmonary effort is normal. No respiratory distress.     Breath sounds: Normal breath sounds. No wheezing or rales.  Abdominal:     General: There is no distension.     Palpations: Abdomen is soft.  Musculoskeletal:     Cervical back: Neck supple.     Right lower leg: No edema.     Left lower leg: No edema.  Lymphadenopathy:     Cervical: No cervical adenopathy.  Skin:    General: Skin is warm and dry.  Neurological:     General: No focal deficit present.     Mental Status: She is alert and oriented to person, place, and time.     Cranial Nerves: No cranial nerve deficit.     Motor: No weakness.  Psychiatric:     Comments: Flat affect   6-minute walk test Ambulated slow pace for 450 feet, limited by leg pain No change in oxygen saturation or heart rate  Diagnostic Tests: NUCLEAR MEDICINE PET SKULL BASE TO THIGH   TECHNIQUE: 6.0 mCi F-18 FDG was injected intravenously. Full-ring PET imaging was performed from the skull base to thigh after the radiotracer. CT data was obtained and used for attenuation correction and anatomic localization.   Fasting blood glucose: 118 mg/dl   COMPARISON:  CT chest  05/18/2021   FINDINGS: Mediastinal blood pool activity: SUV max 2.1   Liver activity: SUV max NA   NECK: The abnormal signal along the left side of the C3-4 vertebral levels on MRI of 06/07/2021 does not correspond to abnormal accentuated metabolic activity does appear to correspond to substantial spurring in this vicinity, and accordingly is thought to be benign.   There is scattered activity in hypermetabolic brown fat in the posterior spare spinal region and extending into the upper shoulder region and lower anterior neck region. No abnormal findings along these foci of accentuated activity to suggest malignancy.   Incidental CT findings: Mild bilateral common carotid atherosclerotic calcification.   CHEST: The dominant medial right middle lobe nodule measures about 2.4 cm in diameter and has a maximum SUV of 8.4, compatible with malignancy. Ground-glass density distal to this lesion likely from postobstructive pneumonitis. 6 by 4 mm right middle lobe nodule on image 37 series 10 has a maximum SUV of 0.9, but is below sensitive PET-CT size thresholds.   Scattered foci accentuated activity in the mediastinum, medial intercostal regions, periaortic region do not have corresponding CT findings and are ascribed to hypermetabolic brown fat. This is also present in the shoulder and lower neck region.   Asymmetric density in the medial retroareolar region of the left breast measuring about 2.0 by 1.1 cm on image 80 series 4, maximum SUV 2.9.   0.6 cm left axillary lymph node, maximum SUV 3.9. 0.6 cm right axillary lymph node with maximum SUV 4.3.   Incidental CT findings: Coronary, aortic arch, and branch vessel atherosclerotic vascular disease.   ABDOMEN/PELVIS: A right inguinal lymph node measuring 0.5 cm in diameter on image 170 series 4 has maximum SUV of 2.8.   Incidental CT findings: Photopenic hepatic cysts. Atherosclerosis is present, including aortoiliac  atherosclerotic disease. Scattered sigmoid colon diverticula.   SKELETON: No significant abnormal hypermetabolic activity in this region.   Incidental CT findings: Old healed right pelvic fracture. Lower lumbar spondylosis and degenerative disc disease. Grade 1 anterolisthesis at L4-5.   IMPRESSION: 1. Hypermetabolic 2.4 cm right middle lobe nodule, maximum SUV 8.4, compatible with malignancy. Adjacent postobstructive pneumonitis. 2. A 6 by 4 mm right middle lobe nodule is not hypermetabolic but is below sensitive PET-CT size thresholds. 3. No compelling findings of active metastatic disease; sensitivity for small metastatic lesions is reduced due to extensive multifocal hypermetabolic brown fat in the neck and chest which causes scattered spurious high uptake. 4. Central lower inner quadrant hypermetabolic activity in the left breast associated with asymmetric density. This may be a reflection of recent workup with surgical excision of intraductal papilloma documented on prior exams from earlier this year. 5. Comment was made on prior MRI of signal along the left side of the C3-4 vertebral level; this appears to represent benign spurring and has no hypermetabolic activity on today's PET-CT. 6. Other imaging findings of potential clinical significance: Aortic Atherosclerosis (ICD10-I70.0). Coronary atherosclerosis. Photopenic hepatic cysts. Lumbar spondylosis and degenerative disc disease.     Electronically Signed   By: Van Clines M.D.   On: 06/17/2021 07:53  I personally reviewed the CT and PET/CT  images.  There is a 2.5 cm right middle lobe nodule.  Hypermetabolic.  Very central.  Smaller more peripheral right middle lobe nodule.  No metabolic activity.  Pulmonary function testing FVC 2.21 (115%) FEV1 1.59 (108%), no change with bronchodilator TLC 4.64 (94%), RV 2.50 (106%) DLCO 14.13 (77%)  Impression: Tracy Davidson is an 80 year old woman with a history of  hypertension, PAD, claudication, DVT, bladder tumor, arthritis, hyperlipidemia, abnormal liver function test, shingles, bladder tumor and ongoing tobacco abuse.    She recently was diagnosed with a DVT in the left popliteal vein.  She is on Eliquis for that.  The DVT was age-indeterminate by duplex.  She did have some swelling so it may have been acute.  No evidence of pulmonary embolus.  CT of the chest done to rule out pulmonary embolus showed a right middle lobe mass about 2.5 cm in diameter abutting the pericardium and very central near the hilum with obstruction of the middle lobe bronchus.  Mass was hypermetabolic by PET CT.  PET limited by metabolic activity in brown fat but radiology interpreted as no evidence of metastatic disease.  Dr. Valeta Harms did bronchoscopy and the tumor extended to the origin of the middle lobe bronchus.  He was able to debride that back to the lateral segmental bronchus.  Based on the findings on CT and Dr. Juline Patch bronchoscopy, she will likely need a bilobectomy to achieve complete resection.  I briefly discussed the proposed procedure to her.  She understands this would be a significant operation.  She does not have family in the area and really does not have much in the way of support.  We will contact the case manager at the hospital to see if she might be candidate for skilled nursing placement after surgery.  So very difficult to get it idea of her functional status as she is barely interactive.  6-minute walk test was limited due to her claudication.  Pulmonary function testing with indicate she is a candidate for bilobectomy.  She would be high risk due to her advanced age, deconditioning, location of the tumor, and recent DVT.  Tobacco abuse-50-pack-year history about a pack a day.  Currently says she is smoking 1/2 pack/day.  She does have patches and says she is willing to try to quit.  I instructed her to quit smoking and we will reassess her in 2  weeks.  Plan: Quit smoking Return in 2 weeks to discuss possible surgical resection.  I spent 15 minutes in review of records, images, and consultation with Tracy Davidson today. Melrose Nakayama, MD [facility] [phone]

## 2021-07-08 NOTE — Progress Notes (Signed)
6 Minute Walk Test Results  Patient: Tracy Davidson Date:  07/08/2021   Supplemental O2 during test? no      Baseline   End  Time   445    450  Heartrate  90    85 Dyspnea  no    A little Fatigue  no    A little O2 sat   96%    95% Blood pressure 155/78    137/78   Patient ambulated at a slow pace for a total distance of 450 feet with 1 stops./5 laps  Ambulation was limited primarily due to right leg pain. She was only able to walk for 5 mins due to leg pain  Overall the test was tolerated was not able to complete      no

## 2021-07-11 ENCOUNTER — Telehealth: Payer: Self-pay | Admitting: Family Medicine

## 2021-07-11 NOTE — Telephone Encounter (Signed)
Patient called wanting to know if she is okay to get her second COVID booster, her flu shot and also her second shingles shot.  Please advise.

## 2021-07-13 NOTE — Telephone Encounter (Signed)
Per FYI left vm informing pt it is okay for her to get her vaccines, informed her not recommended to get all three at once.

## 2021-07-14 DIAGNOSIS — C678 Malignant neoplasm of overlapping sites of bladder: Secondary | ICD-10-CM | POA: Diagnosis not present

## 2021-07-14 DIAGNOSIS — D090 Carcinoma in situ of bladder: Secondary | ICD-10-CM | POA: Diagnosis not present

## 2021-07-20 ENCOUNTER — Other Ambulatory Visit: Payer: Self-pay | Admitting: Medical Oncology

## 2021-07-20 DIAGNOSIS — C342 Malignant neoplasm of middle lobe, bronchus or lung: Secondary | ICD-10-CM

## 2021-07-21 ENCOUNTER — Other Ambulatory Visit: Payer: Self-pay

## 2021-07-21 ENCOUNTER — Inpatient Hospital Stay: Payer: Medicare Other

## 2021-07-21 ENCOUNTER — Inpatient Hospital Stay: Payer: Medicare Other | Attending: Internal Medicine | Admitting: Internal Medicine

## 2021-07-21 ENCOUNTER — Other Ambulatory Visit: Payer: Self-pay | Admitting: Family Medicine

## 2021-07-21 VITALS — BP 152/78 | HR 87 | Temp 97.0°F | Resp 20 | Ht 63.0 in | Wt 122.8 lb

## 2021-07-21 DIAGNOSIS — C3491 Malignant neoplasm of unspecified part of right bronchus or lung: Secondary | ICD-10-CM

## 2021-07-21 DIAGNOSIS — C342 Malignant neoplasm of middle lobe, bronchus or lung: Secondary | ICD-10-CM | POA: Diagnosis not present

## 2021-07-21 DIAGNOSIS — Z7901 Long term (current) use of anticoagulants: Secondary | ICD-10-CM | POA: Diagnosis not present

## 2021-07-21 DIAGNOSIS — I10 Essential (primary) hypertension: Secondary | ICD-10-CM

## 2021-07-21 LAB — CMP (CANCER CENTER ONLY)
ALT: 16 U/L (ref 0–44)
AST: 23 U/L (ref 15–41)
Albumin: 4 g/dL (ref 3.5–5.0)
Alkaline Phosphatase: 72 U/L (ref 38–126)
Anion gap: 10 (ref 5–15)
BUN: 15 mg/dL (ref 8–23)
CO2: 30 mmol/L (ref 22–32)
Calcium: 10.2 mg/dL (ref 8.9–10.3)
Chloride: 101 mmol/L (ref 98–111)
Creatinine: 0.78 mg/dL (ref 0.44–1.00)
GFR, Estimated: >60 mL/min (ref >60)
Glucose, Bld: 93 mg/dL (ref 70–99)
Potassium: 4 mmol/L (ref 3.5–5.1)
Sodium: 141 mmol/L (ref 135–145)
Total Bilirubin: 1.1 mg/dL (ref 0.3–1.2)
Total Protein: 7.9 g/dL (ref 6.5–8.1)

## 2021-07-21 LAB — CBC WITH DIFFERENTIAL (CANCER CENTER ONLY)
Abs Immature Granulocytes: 0.04 10*3/uL (ref 0.00–0.07)
Basophils Absolute: 0 10*3/uL (ref 0.0–0.1)
Basophils Relative: 0 %
Eosinophils Absolute: 0.3 10*3/uL (ref 0.0–0.5)
Eosinophils Relative: 3 %
HCT: 35.9 % — ABNORMAL LOW (ref 36.0–46.0)
Hemoglobin: 11.5 g/dL — ABNORMAL LOW (ref 12.0–15.0)
Immature Granulocytes: 0 %
Lymphocytes Relative: 11 %
Lymphs Abs: 1.2 10*3/uL (ref 0.7–4.0)
MCH: 31.9 pg (ref 26.0–34.0)
MCHC: 32 g/dL (ref 30.0–36.0)
MCV: 99.4 fL (ref 80.0–100.0)
Monocytes Absolute: 0.7 10*3/uL (ref 0.1–1.0)
Monocytes Relative: 7 %
Neutro Abs: 8.4 10*3/uL — ABNORMAL HIGH (ref 1.7–7.7)
Neutrophils Relative %: 79 %
Platelet Count: 190 10*3/uL (ref 150–400)
RBC: 3.61 MIL/uL — ABNORMAL LOW (ref 3.87–5.11)
RDW: 12.6 % (ref 11.5–15.5)
WBC Count: 10.7 10*3/uL — ABNORMAL HIGH (ref 4.0–10.5)
nRBC: 0 % (ref 0.0–0.2)

## 2021-07-21 NOTE — Progress Notes (Signed)
[place] Telephone:([phone]) [phone]   Fax:(336) [phone]  OFFICE PROGRESS NOTE  Billie Ruddy, MD [place] [address]  DIAGNOSIS:  Stage IIB (T3, N0, M0) non-small cell lung cancer, favoring adenocarcinoma.  The patient presented with a right middle lobe central lung mass in addition to a suspicious right middle lobe and right lower lobe pulmonary nodules.  She was diagnosed in August 2022.   PRIOR THERAPY: None   CURRENT THERAPY: Currently being evaluated for surgical resection by Dr. Roxan Hockey  INTERVAL HISTORY: Tracy Davidson 80 y.o. female returns to the clinic today for follow-up visit accompanied by a family member.  The patient is feeling fine today with no concerning complaints except for mild cough and shortness of breath with exertion.  She has no chest pain or hemoptysis.  She denied having any nausea, vomiting, diarrhea or constipation.  She has no headache or visual changes.  She has no weight loss or night sweats.  The patient was seen by Dr. Roxan Hockey and is considered for surgical resection but she will meet with him again next week for more details about her proposed surgery.  She is concerned about the need for rehabilitation after the surgery.  MEDICAL HISTORY: Past Medical History:  Diagnosis Date   ALLERGIC RHINITIS 09/20/2009   Allergy    Arthritis    LEGS and left arm   Bladder tumor    Cataract    both eyes, right worse thsn left   COLONIC POLYPS, HX OF 06/06/2007   DYSLIPIDEMIA 09/20/2009   Dyspnea    sob with activity   GERD (gastroesophageal reflux disease)    HYPERTENSION 06/06/2007   Knee pain    stiff and pain on side   LIVER FUNCTION TESTS, ABNORMAL, HX OF    feb or march 2019   Peripheral vascular disease ([place])    Shingles 11/2019   left arm and shoulder    ALLERGIES:  is allergic to doxycycline.  MEDICATIONS:  Current Outpatient Medications  Medication Sig Dispense Refill   apixaban  (ELIQUIS) 5 MG TABS tablet Take 1 tablet (5 mg total) by mouth 2 (two) times daily. 60 tablet 2   atorvastatin (LIPITOR) 10 MG tablet Take 1 tablet (10 mg total) by mouth daily. 90 tablet 3   calcium carbonate (OSCAL) 1500 (600 Ca) MG TABS tablet Take 1,500 mg by mouth 2 (two) times daily with a meal.     Cholecalciferol (VITAMIN D) 125 MCG (5000 UT) CAPS Take 5,000 Units by mouth daily.      KLOR-CON M20 20 MEQ tablet TAKE 1 TABLET BY MOUTH EVERY DAY 90 tablet 2   lisinopril-hydrochlorothiazide (ZESTORETIC) 20-25 MG tablet TAKE 1 TABLET BY MOUTH EVERY DAY 90 tablet 0   Melatonin 5 MG CAPS Take 5 mg by mouth at bedtime as needed (sleep).     omeprazole (PRILOSEC) 20 MG capsule TAKE 1 CAPSULE BY MOUTH EVERY DAY 90 capsule 1   No current facility-administered medications for this visit.    SURGICAL HISTORY:  Past Surgical History:  Procedure Laterality Date   ABDOMINAL AORTOGRAM W/LOWER EXTREMITY Bilateral 03/03/2020   Procedure: ABDOMINAL AORTOGRAM W/LOWER EXTREMITY;  Surgeon: Marty Heck, MD;  Location: [facility];  Service: Cardiovascular;  Laterality: Bilateral;   BREAST EXCISIONAL BIOPSY Left 1975   BRONCHIAL BIOPSY  06/14/2021   Procedure: BRONCHIAL BIOPSIES;  Surgeon: Garner Nash, DO;  Location: [facility];  Service: Pulmonary;;   BRONCHIAL  BRUSHINGS  06/14/2021   Procedure: BRONCHIAL BRUSHINGS;  Surgeon: Garner Nash, DO;  Location: [facility];  Service: Pulmonary;;   BRONCHIAL NEEDLE ASPIRATION BIOPSY  06/14/2021   Procedure: BRONCHIAL NEEDLE ASPIRATION BIOPSIES;  Surgeon: Garner Nash, DO;  Location: [facility];  Service: Pulmonary;;   COLONOSCOPY  ?2015   CRYOTHERAPY  06/14/2021   Procedure: CRYOTHERAPY;  Surgeon: Garner Nash, DO;  Location: [facility];  Service: Pulmonary;;   CYSTOSCOPY WITH BIOPSY N/A 04/12/2020   Procedure: CYSTOSCOPY WITH BIOPSY  INSTILLATION OF GEMCITABINE;  Surgeon: Lucas Mallow, MD;  Location: [facility];  Service: Urology;  Laterality: N/A;   DILATION AND CURETTAGE OF UTERUS  yrs ago   HEMOSTASIS CONTROL  06/14/2021   Procedure: HEMOSTASIS CONTROL;  Surgeon: Garner Nash, DO;  Location: [facility];  Service: Pulmonary;;   PERIPHERAL VASCULAR INTERVENTION Right 03/03/2020   Procedure: PERIPHERAL VASCULAR INTERVENTION;  Surgeon: Marty Heck, MD;  Location: [facility];  Service: Cardiovascular;  Laterality: Right;   RADIOACTIVE SEED GUIDED EXCISIONAL BREAST BIOPSY Left 02/02/2021   Procedure: RADIOACTIVE SEED GUIDED EXCISIONAL LEFT BREAST BIOPSY;  Surgeon: Stark Klein, MD;  Location: [facility];  Service: General;  Laterality: Left;   [place]  yrs ago   complete   TRANSURETHRAL RESECTION OF BLADDER TUMOR N/A 09/18/2018   Procedure: RESTAGE TRANSURETHRAL RESECTION OF BLADDER TUMOR (TURBT);  Surgeon: Lucas Mallow, MD;  Location: [facility];  Service: Urology;  Laterality: N/A;   TRANSURETHRAL RESECTION OF BLADDER TUMOR WITH MITOMYCIN-C N/A 08/16/2018   Procedure: TRANSURETHRAL RESECTION OF BLADDER TUMOR;  Surgeon: Lucas Mallow, MD;  Location: [facility];  Service: Urology;  Laterality: N/A;   VIDEO BRONCHOSCOPY WITH ENDOBRONCHIAL ULTRASOUND N/A 06/14/2021   Procedure: VIDEO BRONCHOSCOPY WITH ENDOBRONCHIAL ULTRASOUND;  Surgeon: Garner Nash, DO;  Location: [facility];  Service: Pulmonary;  Laterality: N/A;    REVIEW OF SYSTEMS:  A comprehensive review of systems was negative except for: Respiratory: positive for cough   PHYSICAL EXAMINATION: General appearance: alert, cooperative, fatigued, and no distress Head: Normocephalic, without obvious abnormality, atraumatic Neck: no adenopathy, no JVD, supple, symmetrical, trachea midline, and thyroid not enlarged, symmetric, no tenderness/mass/nodules Lymph nodes: Cervical, supraclavicular, and axillary nodes  normal. Resp: clear to auscultation bilaterally Back: symmetric, no curvature. ROM normal. No CVA tenderness. Cardio: regular rate and rhythm, S1, S2 normal, no murmur, click, rub or gallop GI: soft, non-tender; bowel sounds normal; no masses,  no organomegaly Extremities: extremities normal, atraumatic, no cyanosis or edema  ECOG PERFORMANCE STATUS: 1 - Symptomatic but completely ambulatory  Blood pressure (!) 152/78, pulse 87, temperature (!) 97 F (36.1 C), temperature source Tympanic, resp. rate 20, height 5\' 3"  (1.6 m), weight 122 lb 12.8 oz (55.7 kg), SpO2 100 %.  LABORATORY DATA: Lab Results  Component Value Date   WBC 10.7 (H) 07/21/2021   HGB 11.5 (L) 07/21/2021   HCT 35.9 (L) 07/21/2021   MCV 99.4 07/21/2021   PLT 190 07/21/2021      Chemistry      Component Value Date/Time   NA 143 06/21/2021 0952   K 3.9 06/21/2021 0952   CL 103 06/21/2021 0952   CO2 28 06/21/2021 0952   BUN 11 06/21/2021 0952   CREATININE 0.78 06/21/2021 0952      Component Value Date/Time   CALCIUM 10.4 (H) 06/21/2021 0952   ALKPHOS 68  06/21/2021 0952   AST 22 06/21/2021 0952   ALT 15 06/21/2021 0952   BILITOT 0.8 06/21/2021 0952       RADIOGRAPHIC STUDIES: No results found.  ASSESSMENT AND PLAN: This is a very pleasant 80 years old African-American female with stage IIb (T3, N0, M0) non-small cell lung cancer, favoring adenocarcinoma presented with right middle lobe central lung mass in addition to suspicious right middle and right lower lobe pulmonary nodules diagnosed in August 2022. The patient is currently undergoing evaluation by Dr. Roxan Hockey for surgical resection.  She will meet with him again next week for more detailed discussion of this option. If the patient is not a surgical candidate or she declined surgery, we will refer her to radiation oncology for curative radiotherapy. I will see her back for follow-up visit in 6 weeks for evaluation and repeat blood work. She was  advised to call immediately if she has any other concerning symptoms in the interval. The patient voices understanding of current disease status and treatment options and is in agreement with the current care plan.  All questions were answered. The patient knows to call the clinic with any problems, questions or concerns. We can certainly see the patient much sooner if necessary.  The total time spent in the appointment was 20 minutes.  Disclaimer: This note was dictated with voice recognition software. Similar sounding words can inadvertently be transcribed and may not be corrected upon review.

## 2021-07-22 ENCOUNTER — Telehealth: Payer: Self-pay | Admitting: Internal Medicine

## 2021-07-22 NOTE — Telephone Encounter (Signed)
Scheduled follow-up appointment per 10/6 los. Patient is aware.

## 2021-07-26 ENCOUNTER — Ambulatory Visit: Payer: Medicare Other | Admitting: Thoracic Surgery (Cardiothoracic Vascular Surgery)

## 2021-07-26 ENCOUNTER — Other Ambulatory Visit: Payer: Self-pay

## 2021-07-26 ENCOUNTER — Encounter: Payer: Self-pay | Admitting: Thoracic Surgery (Cardiothoracic Vascular Surgery)

## 2021-07-26 VITALS — BP 156/71 | HR 94 | Resp 20 | Ht 63.0 in | Wt 122.0 lb

## 2021-07-26 DIAGNOSIS — C3491 Malignant neoplasm of unspecified part of right bronchus or lung: Secondary | ICD-10-CM

## 2021-07-26 NOTE — Progress Notes (Signed)
[address]       [place] [address]             [phone]      HPI: Tracy Davidson returns for a scheduled follow-up visit  Tracy Davidson is an 80 year old woman with a history of hypertension, PAD, claudication, recent DVT, bladder tumor, arthritis, hyperlipidemia, shingles, and tobacco abuse.  She was diagnosed with a left popliteal DVT about 2 months ago.  A CT angiogram of the chest showed no pulmonary embolus but there was a 2.6 cm right middle lobe perihilar mass abutting the pleura and pericardium with obstruction of the middle lobe bronchus.  On PET/CT the nodule was hypermetabolic.  Dr. Valeta Harms did bronchoscopy and endobronchial ultrasound.  There was tumor at the origin of the middle lobe bronchus.  Biopsies were positive for non-small cell carcinoma.  She was sent for consideration for surgical resection.  She does not provide much history and it was difficult to get an idea of her functional status.  We tried to do a 6-minute walk test but she had to stop due to claudication.  Her pulmonary function is adequate to tolerate a bilobectomy, which would likely be necessary.    In the interim since her last visit she says she has stopped smoking.  Past Medical History:  Diagnosis Date   ALLERGIC RHINITIS 09/20/2009   Allergy    Arthritis    LEGS and left arm   Bladder tumor    Cataract    both eyes, right worse thsn left   COLONIC POLYPS, HX OF 06/06/2007   DYSLIPIDEMIA 09/20/2009   Dyspnea    sob with activity   GERD (gastroesophageal reflux disease)    HYPERTENSION 06/06/2007   Knee pain    stiff and pain on side   LIVER FUNCTION TESTS, ABNORMAL, HX OF    feb or march 2019   Peripheral vascular disease ([place])    Shingles 11/2019   left arm and shoulder    Current Outpatient Medications  Medication Sig Dispense Refill   apixaban (ELIQUIS) 5 MG TABS tablet Take 1 tablet (5 mg total) by mouth 2 (two) times daily. 60 tablet 2   atorvastatin  (LIPITOR) 10 MG tablet Take 1 tablet (10 mg total) by mouth daily. 90 tablet 3   calcium carbonate (OSCAL) 1500 (600 Ca) MG TABS tablet Take 1,500 mg by mouth 2 (two) times daily with a meal.     Cholecalciferol (VITAMIN D) 125 MCG (5000 UT) CAPS Take 5,000 Units by mouth daily.      KLOR-CON M20 20 MEQ tablet TAKE 1 TABLET BY MOUTH EVERY DAY 90 tablet 2   lisinopril-hydrochlorothiazide (ZESTORETIC) 20-25 MG tablet TAKE 1 TABLET BY MOUTH EVERY DAY 90 tablet 0   Melatonin 5 MG CAPS Take 5 mg by mouth at bedtime as needed (sleep).     omeprazole (PRILOSEC) 20 MG capsule TAKE 1 CAPSULE BY MOUTH EVERY DAY 90 capsule 1   No current facility-administered medications for this visit.    Physical Exam BP (!) 156/71 (BP Location: Right Arm, Patient Position: Sitting, Cuff Size: Normal)   Pulse 94   Resp 20   Ht 5\' 3"  (1.6 m)   Wt 122 lb (55.3 kg)   SpO2 97% Comment: RA  BMI 21.61 kg/m  Relatively frail-appearing 80 year old woman in no acute distress Alert and oriented x3 with no focal neurologic deficits, flat affect Lungs clear bilaterally Cardiac regular rate and rhythm with no  murmurs No cervical or supraclavicular adenopathy  Diagnostic Tests: Pulmonary function testing FVC 2.21 (115%) FEV1 1.59 (108%) DLCO 14.13 (77%)  I again reviewed her CT and PET/CT images.  2.6 cm right middle lobe lesion centrally located with obstruction of middle lobe bronchus and some postobstructive pneumonitis.  Impression: Tracy Davidson is an 80 year old woman with a history of hypertension, PAD, claudication, recent DVT, bladder tumor, arthritis, hyperlipidemia, shingles, and tobacco abuse.  She recently was diagnosed with non-small cell carcinoma of the right middle lobe with a central lesion.  Based on imaging this would be a T2N0 lesion.  Based on the CT images and bronchoscopy findings where the lesion came to the origin of the middle lobe bronchus, I think she would require a bilobectomy to achieve  a complete surgical resection.  I again discussed with Tracy Davidson the options for treatment which include radiation with or without chemotherapy and surgical resection.  She has adequate pulmonary function to tolerate surgical resection but I am concerned with her ability to recuperate afterwards.  She would be a high risk patient due to her comorbidities.  I again reviewed the indications, risk, benefits, and alternatives.  She understands that the risk include, but not limited to death, MI, DVT, PE, bleeding, possible need for transfusion, infection, prolonged air leak, cardiac arrhythmias, respiratory or renal failure, as well as possibility of other unforeseeable complications.    She asked about delaying surgery until after the first of the year.  I told her that would not be reasonable.  She would be better off treating with radiation if that were the case.  She remains undecided.  She did quit smoking and I congratulated her for that.  Plan: She will let Dr. Julien Nordmann and me know when she decides how she would like to proceed.  Melrose Nakayama, MD [facility] [phone]

## 2021-07-29 ENCOUNTER — Telehealth: Payer: Self-pay | Admitting: *Deleted

## 2021-07-29 NOTE — Telephone Encounter (Signed)
Per Dr.  Julien Nordmann, I called Tracy Davidson to ask her about her thoughts regarding treatment.  She is still thinking about either surgery or radiation. She asked that I call her next week and she will update me on her wishes.  Will call her back.

## 2021-08-01 ENCOUNTER — Telehealth: Payer: Self-pay | Admitting: Family Medicine

## 2021-08-01 NOTE — Chronic Care Management (AMB) (Signed)
  Chronic Care Management   Note  08/01/2021 Name: [name] MRN: [id number] DOB: [birth year]  Tracy Davidson is a 80 y.o. year old female who is a primary care patient of Billie Ruddy, MD. I reached out to Tracy Davidson by phone today in response to a referral sent by Ms. Harvin Hazel Penalver's PCP, Billie Ruddy, MD.   Ms. Brach was given information about Chronic Care Management services today including:  CCM service includes personalized support from designated clinical staff supervised by her physician, including individualized plan of care and coordination with other care providers 24/7 contact phone numbers for assistance for urgent and routine care needs. Service will only be billed when office clinical staff spend 20 minutes or more in a month to coordinate care. Only one practitioner may furnish and bill the service in a calendar month. The patient may stop CCM services at any time (effective at the end of the month) by phone call to the office staff.   Patient agreed to services and verbal consent obtained.   Follow up plan:   Tatjana Secretary/administrator

## 2021-08-11 NOTE — Telephone Encounter (Signed)
Pt called today stating she would like to move forward with radiation.

## 2021-08-12 ENCOUNTER — Telehealth: Payer: Self-pay | Admitting: *Deleted

## 2021-08-12 ENCOUNTER — Encounter: Payer: Self-pay | Admitting: *Deleted

## 2021-08-12 DIAGNOSIS — C3491 Malignant neoplasm of unspecified part of right bronchus or lung: Secondary | ICD-10-CM

## 2021-08-12 NOTE — Progress Notes (Signed)
I received a message that Ms. Sahli called and would like to precede with radiation oncology.  Per Dr. Julien Nordmann, referral to rad onc completed.

## 2021-08-12 NOTE — Telephone Encounter (Signed)
I called Tracy Davidson to update her regarding rad onc referral.  We spoke and I explained that rad onc scheduling will call her with and appt.  She verbalized understanding.

## 2021-08-15 ENCOUNTER — Ambulatory Visit
Admission: RE | Admit: 2021-08-15 | Discharge: 2021-08-15 | Disposition: A | Discharge status: Home / Self Care | Payer: Medicare Other | Source: Ambulatory Visit | Attending: Radiation Oncology | Admitting: Radiation Oncology

## 2021-08-15 ENCOUNTER — Encounter: Payer: Self-pay | Admitting: Radiation Oncology

## 2021-08-15 ENCOUNTER — Other Ambulatory Visit: Payer: Self-pay

## 2021-08-15 ENCOUNTER — Encounter: Payer: Self-pay | Admitting: *Deleted

## 2021-08-15 VITALS — BP 153/68 | HR 78 | Temp 97.0°F | Resp 18 | Ht 63.0 in | Wt 125.4 lb

## 2021-08-15 DIAGNOSIS — I6782 Cerebral ischemia: Secondary | ICD-10-CM | POA: Insufficient documentation

## 2021-08-15 DIAGNOSIS — I10 Essential (primary) hypertension: Secondary | ICD-10-CM | POA: Insufficient documentation

## 2021-08-15 DIAGNOSIS — Z87891 Personal history of nicotine dependence: Secondary | ICD-10-CM | POA: Diagnosis not present

## 2021-08-15 DIAGNOSIS — I739 Peripheral vascular disease, unspecified: Secondary | ICD-10-CM | POA: Diagnosis not present

## 2021-08-15 DIAGNOSIS — M129 Arthropathy, unspecified: Secondary | ICD-10-CM | POA: Diagnosis not present

## 2021-08-15 DIAGNOSIS — E785 Hyperlipidemia, unspecified: Secondary | ICD-10-CM | POA: Insufficient documentation

## 2021-08-15 DIAGNOSIS — C3491 Malignant neoplasm of unspecified part of right bronchus or lung: Secondary | ICD-10-CM | POA: Diagnosis not present

## 2021-08-15 DIAGNOSIS — Z86010 Personal history of colonic polyps: Secondary | ICD-10-CM | POA: Insufficient documentation

## 2021-08-15 DIAGNOSIS — Z7901 Long term (current) use of anticoagulants: Secondary | ICD-10-CM | POA: Diagnosis not present

## 2021-08-15 DIAGNOSIS — K219 Gastro-esophageal reflux disease without esophagitis: Secondary | ICD-10-CM | POA: Insufficient documentation

## 2021-08-15 NOTE — Progress Notes (Signed)
Radiation Oncology         [phone] ________________________________  Initial Outpatient Consultation  Name: Tracy Davidson MRN: [id number]  Date: 08/15/2021  DOB: [birth year]  XF:GHWEX, Tracy Adie, MD  Tracy Bears, MD   REFERRING PHYSICIAN: Curt Bears, MD  DIAGNOSIS: The primary encounter diagnosis was Non-small cell lung cancer, right ([place]). A diagnosis of Endobronchial cancer, right [facility]) was also pertinent to this visit.  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 female, current smoker, who is accompanied by a good friend. she is seen as a courtesy of Dr. Julien Davidson for an opinion concerning radiation therapy as part of management for her recently diagnosed non-small cell carcinoma of the RML. The patient initially presented to her PCP, Dr. Volanda Davidson, this past August with cough and shortness of breath.     Accordingly, the patient was sent for chest CTA performed on 05/18/2021 which showed no evidence for pulmonary embolism though did demonstrate a perihilar mass of the right middle lobe measuring 2.6 x 2.5 cm, which closely abuts the pleura and pericardium. RML mass was noted as consistent with primary lung malignancy, obstructing the middle lobe bronchi with bronchial impaction and volume loss of the right middle lobe. Also seen were multiple small predominantly groundglass nodules throughout the right middle lobe, noted as likely postobstructive airspace disease. Additional small nonspecific nodules of the right middle and right lower lobe about the right major fissure were also appreciated, measuring 0.7 and 0.6 cm and without lymphadenopathy.  Dr. Volanda Davidson then referred the patient to Dr. Julien Davidson on 05/31/21 for evaluation and recommendation regarding her condition. During this visit, the patient denied chest pain, shortness of breath, or hemoptysis. Accordingly, Dr. Julien Davidson ordered further  imaging to rule out metastatic disease.   MRI of the brain on 06/07/21 showed no evidence of metastatic disease to the brain or leptomeninges. Incidental findings included moderate chronic small-vessel ischemic changes of the cerebral hemispheric white matter, and some abnormal signaling along the left side of the L3 and L4 vertebral bodies, noted as likely due to degenerative osteophyte formation.   Patient was then referred to Dr. Valeta Davidson on 06/08/21 for consideration of bronchoscopy. Following discussion of risks, the patient opted to proceed with planned bronchoscopy.  PET on 06/15/21 demonstrated the hypermetabolic 2.4 cm right middle lobe nodule, with a maximum SUV 8.4, As compatible with malignancy. Adjacent postobstructive pneumonitis was also appreciated. A 6 x 4 mm right middle lobe nodule also seen was non-hypermetabolic though below PET/CT size thresholds. No compelling findings of active metastatic disease were appreciated (though sensitivity for small metastatic lesions was reduced on this imaging due to extensive multifocal hypermetabolic brown fat in the neck and chest which caused scattered spurious high uptake). Of note: central lower inner quadrant hypermetabolic activity in the left breast associated with asymmetric density was also seen, though this finding was noted as possibly reflective of recent workup with surgical excision of intraductal papilloma from earlier this year.   As planned, the patient proceeded to undergo bronchoscopy with right middle lobe biopsies on 06/14/21 under the care of Dr. Valeta Davidson. Pathology from the procedure revealed: malignant cells consistent with non-small cell carcinoma of the RML. Bronchoscopy revealed the tumor to extend to origin of the middle lobe bronchus; Dr. Valeta Davidson debrided the mass back to the lateral segmental bronchus.  Immunohistochemistry was consistent with adenocarcinoma.  Patient was then referred to Dr. Roxan Davidson, cardiothoracic surgery,  for consultation on 07/08/21. Following discussion of surgical options, Dr. Roxan Davidson noted that the patient is likely a good candidate for a bilobectomy to achieve complete resection. The patient was advised to quite smoking and return in 2 weeks to discuss surgical resectioning further.  Accordingly, the patient followed up with Dr. Roxan Davidson on 07/26/21 to continue discussion of surgical interventions. The patient requested delaying surgery until after the first of the year, though was informed that this would not be reasonable. Subsequently, Dr. Roxan Davidson informed the patient that she would be better off receiving radiation treatment if that is her final decision. (Patient reported quitting smoking which is excellent).   PREVIOUS RADIATION THERAPY: No  PAST MEDICAL HISTORY:  Past Medical History:  Diagnosis Date   ALLERGIC RHINITIS 09/20/2009   Allergy    Arthritis    LEGS and left arm   Bladder tumor    Cataract    both eyes, right worse thsn left   COLONIC POLYPS, HX OF 06/06/2007   DYSLIPIDEMIA 09/20/2009   Dyspnea    sob with activity   GERD (gastroesophageal reflux disease)    HYPERTENSION 06/06/2007   Knee pain    stiff and pain on side   LIVER FUNCTION TESTS, ABNORMAL, HX OF    feb or march 2019   Peripheral vascular disease ([place])    Shingles 11/2019   left arm and shoulder    PAST SURGICAL HISTORY: Past Surgical History:  Procedure Laterality Date   ABDOMINAL AORTOGRAM W/LOWER EXTREMITY Bilateral 03/03/2020   Procedure: ABDOMINAL AORTOGRAM W/LOWER EXTREMITY;  Surgeon: Marty Heck, MD;  Location: [place] [facility];  Service: Cardiovascular;  Laterality: Bilateral;   BREAST EXCISIONAL BIOPSY Left 1975   BRONCHIAL BIOPSY  06/14/2021   Procedure: BRONCHIAL BIOPSIES;  Surgeon: Garner Nash, DO;  Location: [place] [facility];  Service: Pulmonary;;   BRONCHIAL BRUSHINGS  06/14/2021   Procedure: BRONCHIAL BRUSHINGS;  Surgeon: Garner Nash, DO;  Location: [place]  [facility];  Service: Pulmonary;;   BRONCHIAL NEEDLE ASPIRATION BIOPSY  06/14/2021   Procedure: BRONCHIAL NEEDLE ASPIRATION BIOPSIES;  Surgeon: Garner Nash, DO;  Location: [place];  Service: Pulmonary;;   COLONOSCOPY  ?2015   CRYOTHERAPY  06/14/2021   Procedure: CRYOTHERAPY;  Surgeon: Garner Nash, DO;  Location: [place] [facility];  Service: Pulmonary;;   CYSTOSCOPY WITH BIOPSY N/A 04/12/2020   Procedure: CYSTOSCOPY WITH BIOPSY  INSTILLATION OF GEMCITABINE;  Surgeon: Lucas Mallow, MD;  Location: [place];  Service: Urology;  Laterality: N/A;   DILATION AND CURETTAGE OF UTERUS  yrs ago   HEMOSTASIS CONTROL  06/14/2021   Procedure: HEMOSTASIS CONTROL;  Surgeon: Garner Nash, DO;  Location: [place];  Service: Pulmonary;;   PERIPHERAL VASCULAR INTERVENTION Right 03/03/2020   Procedure: PERIPHERAL VASCULAR INTERVENTION;  Surgeon: Marty Heck, MD;  Location: [place] [facility];  Service: Cardiovascular;  Laterality: Right;   RADIOACTIVE SEED GUIDED EXCISIONAL BREAST BIOPSY Left 02/02/2021   Procedure: RADIOACTIVE SEED GUIDED EXCISIONAL LEFT BREAST BIOPSY;  Surgeon: Stark Klein, MD;  Location: [place];  Service: General;  Laterality: Left;   [place]  yrs ago   complete   TRANSURETHRAL RESECTION OF BLADDER TUMOR N/A 09/18/2018   Procedure: RESTAGE TRANSURETHRAL RESECTION OF BLADDER TUMOR (TURBT);  Surgeon: Lucas Mallow, MD;  Location[facility];  Service: Urology;  Laterality: N/A;   TRANSURETHRAL RESECTION OF BLADDER TUMOR WITH MITOMYCIN-C N/A 08/16/2018  Procedure: TRANSURETHRAL RESECTION OF BLADDER TUMOR;  Surgeon: Lucas Mallow, MD;  [facility];  Service: Urology;  Laterality: N/A;   VIDEO BRONCHOSCOPY WITH ENDOBRONCHIAL ULTRASOUND N/A 06/14/2021   Procedure: VIDEO BRONCHOSCOPY WITH ENDOBRONCHIAL ULTRASOUND;  Surgeon: Garner Nash, DO;   Location: [place];  Service: Pulmonary;  Laterality: N/A;    FAMILY HISTORY:  Family History  Problem Relation Age of Onset   Colon cancer Neg Hx    Colon polyps Neg Hx    Esophageal cancer Neg Hx    Rectal cancer Neg Hx    Stomach cancer Neg Hx    Pancreatic cancer Neg Hx    Breast cancer Neg Hx     SOCIAL HISTORY:  Social History   Tobacco Use   Smoking status: Former    Packs/day: 1.00    Years: 50.00    Pack years: 50.00    Types: Cigarettes    Start date: 1966   Smokeless tobacco: Never   Tobacco comments:    Stopped smoking 07/10/2021  Vaping Use   Vaping Use: Never used  Substance Use Topics   Alcohol use: Not Currently    Alcohol/week: 2.0 standard drinks    Types: 2 Shots of liquor per week    Comment: daily alcohol   Drug use: No    ALLERGIES:  Allergies  Allergen Reactions   Doxycycline Palpitations    MEDICATIONS:  Current Outpatient Medications  Medication Sig Dispense Refill   apixaban (ELIQUIS) 5 MG TABS tablet Take 1 tablet (5 mg total) by mouth 2 (two) times daily. 60 tablet 2   atorvastatin (LIPITOR) 10 MG tablet Take 1 tablet (10 mg total) by mouth daily. 90 tablet 3   calcium carbonate (OSCAL) 1500 (600 Ca) MG TABS tablet Take 1,500 mg by mouth 2 (two) times daily with a meal.     Cholecalciferol (VITAMIN D) 125 MCG (5000 UT) CAPS Take 5,000 Units by mouth daily.      KLOR-CON M20 20 MEQ tablet TAKE 1 TABLET BY MOUTH EVERY DAY 90 tablet 2   lisinopril-hydrochlorothiazide (ZESTORETIC) 20-25 MG tablet TAKE 1 TABLET BY MOUTH EVERY DAY 90 tablet 0   Melatonin 5 MG CAPS Take 5 mg by mouth at bedtime as needed (sleep).     omeprazole (PRILOSEC) 20 MG capsule TAKE 1 CAPSULE BY MOUTH EVERY DAY 90 capsule 1   No current facility-administered medications for this encounter.    REVIEW OF SYSTEMS:  A 10+ POINT REVIEW OF SYSTEMS WAS OBTAINED including neurology, dermatology, psychiatry, cardiac, respiratory, lymph, extremities, GI, GU,  musculoskeletal, constitutional, reproductive, HEENT.  She denies any pain within the chest area significant cough or hemoptysis.  She denies any headaches or blurred vision.   PHYSICAL EXAM:  height is 5\' 3"  (1.6 m) and weight is 125 lb 6 oz (56.9 kg). Her temporal temperature is 97 F (36.1 C) (abnormal). Her blood pressure is 153/68 (abnormal) and her pulse is 78. Her respiration is 18 and oxygen saturation is 100%.   General: Alert and oriented, in no acute distress HEENT: Head is normocephalic. Extraocular movements are intact.  Neck: Neck is supple, no palpable cervical or supraclavicular lymphadenopathy. Heart: Regular in rate and rhythm with no murmurs, rubs, or gallops. Chest: Clear to auscultation bilaterally, with no rhonchi, wheezes, or rales. Abdomen: Soft, nontender, nondistended, with no rigidity or guarding. Extremities: No cyanosis or edema. Lymphatics: see Neck Exam Skin: No concerning lesions. Musculoskeletal: symmetric strength and muscle tone throughout. Neurologic:  Cranial nerves II through XII are grossly intact. No obvious focalities. Speech is fluent. Coordination is intact. Psychiatric: Judgment and insight are intact. Affect is appropriate.   ECOG = 1  0 - Asymptomatic (Fully active, able to carry on all predisease activities without restriction)  1 - Symptomatic but completely ambulatory (Restricted in physically strenuous activity but ambulatory and able to carry out work of a light or sedentary nature. For example, light housework, office work)  2 - Symptomatic, <50% in bed during the day (Ambulatory and capable of all self care but unable to carry out any work activities. Up and about more than 50% of waking hours)  3 - Symptomatic, >50% in bed, but not bedbound (Capable of only limited self-care, confined to bed or chair 50% or more of waking hours)  4 - Bedbound (Completely disabled. Cannot carry on any self-care. Totally confined to bed or chair)  5 -  Death   Eustace Pen MM, Creech RH, Tormey DC, et al. [phone]). "Toxicity and response criteria of the [facility] Group". [place] Oncol. 5 (6): 649-55  LABORATORY DATA:  Lab Results  Component Value Date   WBC 10.7 (H) 07/21/2021   HGB 11.5 (L) 07/21/2021   HCT 35.9 (L) 07/21/2021   MCV 99.4 07/21/2021   PLT 190 07/21/2021   NEUTROABS 8.4 (H) 07/21/2021   Lab Results  Component Value Date   NA 141 07/21/2021   K 4.0 07/21/2021   CL 101 07/21/2021   CO2 30 07/21/2021   GLUCOSE 93 07/21/2021   CREATININE 0.78 07/21/2021   CALCIUM 10.2 07/21/2021      RADIOGRAPHY: No results found.    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  We discussed that surgery has the best chance for cure in this situation.  As above the patient has decided not to pursue any surgical intervention.  she would be good candidate for definitive course of radiation therapy directed at the area of concern.  Given its close proximity to the pericardium I doubt that she would be a candidate for SBRT.  The patient however would be a good candidate for hypofractionated accelerated radiation therapy over approximately 10 treatments.  Today, I talked to the patient and and her friend about the findings and work-up thus far.  We discussed the natural history of non-small cell lung cancer and general treatment, highlighting the role of radiotherapy in the management.  We discussed the available radiation techniques, and focused on the details of logistics and delivery.  We reviewed the anticipated acute and late sequelae associated with radiation in this setting.  The patient was encouraged to ask questions that I answered to the best of my ability.  A patient consent form was discussed and signed.  We retained a copy for our records.  The patient would like to proceed with radiation  and will be scheduled for CT simulation.  PLAN: The patient will return for CT simulation on November 7 at 1030.  Treatment to begin approximately a week later.  Anticipate 2 weeks of radiation therapy.   60 minutes of total time was spent for this patient encounter, including preparation, face-to-face counseling with the patient and coordination of care, physical exam, and documentation of the encounter.   ------------------------------------------------  Blair Promise, PhD, MD  This document serves as a record of services personally performed by [name],  MD. It was created on his behalf by Roney Mans, a trained medical scribe. The creation of this record is based on the scribe's personal observations and the provider's statements to them. This document has been checked and approved by the attending provider.

## 2021-08-15 NOTE — Progress Notes (Signed)
I followed up on Tracy Davidson to check on her appts with rad onc. She is set up to see Dr. Sondra Come today.

## 2021-08-15 NOTE — Progress Notes (Signed)
Location of tumor and Histology per Pathology Report: Stage IIB (T3, N0, M0) non-small cell lung cancer, favoring adenocarcinoma.  The patient presented with a right middle lobe central lung mass in addition to a suspicious right middle lobe and right lower lobe pulmonary nodules.  She was diagnosed in August 2022.  Biopsy:    Past/Anticipated interventions by surgeon, if any:  Dr Roxan Hockey I think she would require a bilobectomy to achieve a complete surgical resection. She would be a high risk patient due to her comorbidities. She asked about delaying surgery until after the first of the year.  I told her that would not be reasonable.  She would be better off treating with radiation if that were the case. She remains undecided.  Plan: She will let Dr. Julien Nordmann and me know when she decides how she would like to proceed  Past/Anticipated interventions by medical oncology, if any: Chemotherapy - not at this time    Pain issues, if any:  yes, right leg and thigh intermittent, aching, and tightness  SAFETY ISSUES: Prior radiation? no Pacemaker/ICD? no Possible current pregnancy? no Is the patient on methotrexate? no  Current Complaints / other details:  shortness of breath with activity, occasional productive cough with clear to yellow phlegm with hemoptysis that has resolved over the last 2 weeks, likely from biopsy     Vitals:   08/15/21 1249  BP: (!) 153/68  Pulse: 78  Resp: 18  Temp: (!) 97 F (36.1 C)  TempSrc: Temporal  SpO2: 100%  Weight: 125 lb 6 oz (56.9 kg)  Height: 5\' 3"  (1.6 m)

## 2021-08-15 NOTE — Progress Notes (Signed)
See MD note for nursing evaluation. °

## 2021-08-16 ENCOUNTER — Telehealth: Payer: Self-pay | Admitting: Pharmacist

## 2021-08-16 NOTE — Chronic Care Management (AMB) (Signed)
Chronic Care Management Pharmacy Assistant   Name: Tracy Davidson  MRN: [id number] DOB: [birth year]  Tracy Davidson is an 80 y.o. year old female who presents for his initial CCM visit with the clinical pharmacist.  Reason for Encounter: Chart prep for initial visit with Jeni Salles the clinical pharmacist on 08/17/2021   Conditions to be addressed/monitored: HTN and PAD, DVT, Dyslipidemia , Atherosclerosis, Lung cancer  Recent office visits:  06/03/2021 [name] MD (PCP) - Patient was seen for Right knee pain and additional issues. Changed Apixaban 5 mg to take two times daily. Discontinued Aspirin. No follow up noted.   05/18/2021 Betty Martinique MD (PCP) - Patient was seen for Malignant neoplasm of middle lobe of right lung and an additional issue.  Discontinued Tramadol 50mg . Referrals to Cardiothoracic surgery and Hematology Oncology. Follow up if symptoms worsen or fail to improve.  05/12/2021 [name] MD (PCP) - Patient was seen for Eczema and additional issues. Started Furosemide 20 mg daily, changed Triamcinolone Acetonide from 0.1% cream to 0.5% ointment. Follow up in 1 week if symptoms worsen or fail to improve.  03/30/2021 [name] MD (PCP) - Patient was seen for Atopic dermatitis and additional issues. Started Triamcinolone Acetonide 0.1% apply twice daily. Follow up in 1 month if symptoms worsen or fail to improve.  Recent consult visits:  07/26/2021 Modesto Charon MD (Cardiothoracic surgery) - Patient was seen for non small cell lung cancer, right.  No medication changes. No follow up noted.  07/21/2021 Curt Bears MD (oncology) - Patient was seen for non small cell lung cancer, right. No medication changes. Follow up 6 months.  06/22/2021 [name] DO (Pulmonary Disease) - Patient was seen for non small cell lung cancer, right and additional issues. No medication changes. Follow up in 4-6 months.  06/21/2021 Cassandra Heilingoetter  PA-C(oncology) - Patient was seen for non small cell lung cancer, right and additional issues. Discontinued Furosemide 20 mg. Follow up in 1 month.  06/08/2021 [name] DO (Pulmonary Disease) - Patient was seen for non small cell lung cancer, right and additional issues. Discontinued Capsaicin 0.075% and Triamcinolone Acetonide 0.5%. Follow up in 2 weeks.  05/31/2021 Curt Bears MD (oncology) - Patient was seen for Malignant neoplasm of unspecified part of unspecified bronchus or lung and an additional issue. No medication changes.  Follow up in 2 weeks.   03/15/2021 [name] MD (Vascular surgery) - Patient was seen for PAD. No medication changes. Follow up in 1 year.  Hospital visits: None    Medications: Outpatient Encounter Medications as of 08/16/2021  Medication Sig   apixaban (ELIQUIS) 5 MG TABS tablet Take 1 tablet (5 mg total) by mouth 2 (two) times daily.   atorvastatin (LIPITOR) 10 MG tablet Take 1 tablet (10 mg total) by mouth daily.   calcium carbonate (OSCAL) 1500 (600 Ca) MG TABS tablet Take 1,500 mg by mouth 2 (two) times daily with a meal.   Cholecalciferol (VITAMIN D) 125 MCG (5000 UT) CAPS Take 5,000 Units by mouth daily.    KLOR-CON M20 20 MEQ tablet TAKE 1 TABLET BY MOUTH EVERY DAY   lisinopril-hydrochlorothiazide (ZESTORETIC) 20-25 MG tablet TAKE 1 TABLET BY MOUTH EVERY DAY   Melatonin 5 MG CAPS Take 5 mg by mouth at bedtime as needed (sleep).   omeprazole (PRILOSEC) 20 MG capsule TAKE 1 CAPSULE BY MOUTH EVERY DAY   No facility-administered encounter medications on file as of 08/16/2021.   Fill History: ELIQUIS 5 MG TABLET 08/02/2021  30   ATORVASTATIN 10 MG TABLET 06/18/2021 90   LISINOPRIL-HCTZ 20-25 MG TAB 07/21/2021 90   OMEPRAZOLE DR 20 MG CAPSULE 08/07/2021 90   TRIAMCINOLONE 0.5% OINTMENT 05/12/2021 30   Have you seen any other providers since your last visit? **She saw Dr. Sondra Come yesterday 08/15/2021  Any changes in your medications  or health? no  Any side effects from any medications? no  Do you have an symptoms or problems not managed by your medications? Yes, she is still having problems with the itching and dryness on her shoulder, back and legs.  Any concerns about your health right now? Yes, Suggested surgery by Dr. Roxan Hockey, she doesn't feel due to her age and the limited benefits would be good for her. She has asked Dr. Inda Merlin about the benefits of just doing radiation.  Has your provider asked that you check blood pressure, blood sugar, or follow special diet at home? No Patient states she doesn't eat breakfast however, she might have an Ensure, for lunch she will have leftovers, a sandwich or if she is not home she will eat out, for dinner she will have a meat, mostly chicken and once a month pork, with a vegetable and starch.    Do you get any type of exercise on a regular basis? No, patient does walk around her house and gets her own groceries, she does minimal housework.  Can you think of a goal you would like to reach for your health? No  Do you have any problems getting your medications? Yes. Eliquis is too expensive  Is there anything that you would like to discuss during the appointment? Yes, she continues to have pain in her right leg up into her hip.  Please bring medications and supplements to appointment  Care Gaps: AWV - scheduled for 10/27/2021 Shingrix - never done TDAP - overdue Covid vaccine - overdue Last BP - 156/71 on 07/26/2021 Last A1C - 4.5 on 01/29/2020  Star Rating Drugs: Atorvastatin 10 mg - last filled 06/18/2021 90 DS at [facility] Lisinopril HCTZ 20/25 mg - last filled 07/21/2021 90 DS at [place] Pharmacist Assistant [phone]

## 2021-08-17 ENCOUNTER — Ambulatory Visit (INDEPENDENT_AMBULATORY_CARE_PROVIDER_SITE_OTHER): Payer: Medicare Other | Admitting: Pharmacist

## 2021-08-17 DIAGNOSIS — I739 Peripheral vascular disease, unspecified: Secondary | ICD-10-CM

## 2021-08-17 DIAGNOSIS — F17200 Nicotine dependence, unspecified, uncomplicated: Secondary | ICD-10-CM

## 2021-08-17 DIAGNOSIS — I10 Essential (primary) hypertension: Secondary | ICD-10-CM

## 2021-08-17 NOTE — Progress Notes (Signed)
Chronic Care Management Pharmacy Note  08/18/2021 Name:  Tracy Davidson MRN:  [id number] DOB:  1941/08/04  Summary: Vitamin D is over supplemented LDL is not at goal < 70  Recommendations/Changes made from today's visit: -Discussed with PCP about duration of therapy for Eliquis -Recommend repeat vitamin D level and DEXA -Recommended routine BP monitoring at home -Recommended trial of timed release melatonin  Plan: Follow up BP assessment in 1-2 months   Subjective: Tracy Davidson is an 80 y.o. year old female who is a primary patient of Billie Ruddy, MD.  The CCM team was consulted for assistance with disease management and care coordination needs.    Engaged with patient by telephone for initial visit in response to provider referral for pharmacy case management and/or care coordination services.   Consent to Services:  The patient was given the following information about Chronic Care Management services today, agreed to services, and gave verbal consent: 1. CCM service includes personalized support from designated clinical staff supervised by the primary care provider, including individualized plan of care and coordination with other care providers 2. 24/7 contact phone numbers for assistance for urgent and routine care needs. 3. Service will only be billed when office clinical staff spend 20 minutes or more in a month to coordinate care. 4. Only one practitioner may furnish and bill the service in a calendar month. 5.The patient may stop CCM services at any time (effective at the end of the month) by phone call to the office staff. 6. The patient will be responsible for cost sharing (co-pay) of up to 20% of the service fee (after annual deductible is met). Patient agreed to services and consent obtained.  Patient Care Team: Billie Ruddy, MD as PCP - General (Family Medicine) Bettina Gavia Hilton Cork, MD as PCP - Cardiology (Cardiology) Valrie Hart, RN as Oncology Nurse  Navigator (Oncology) Viona Gilmore, [facility] as Pharmacist (Pharmacist)  Recent office visits: 06/03/2021 [name] MD - Patient was seen for Right knee pain and additional issues. Changed Apixaban 5 mg to take two times daily. Discontinued Aspirin. No follow up noted.    05/18/2021 Betty Martinique MD - Patient was seen for Malignant neoplasm of middle lobe of right lung and an additional issue.  Discontinued Tramadol 54m. Referrals to Cardiothoracic surgery and Hematology Oncology. Follow up if symptoms worsen or fail to improve.   05/12/2021 SGrier MittsMD - Patient was seen for Eczema and additional issues. Started Furosemide 20 mg daily, changed Triamcinolone Acetonide from 0.1% cream to 0.5% ointment. Follow up in 1 week if symptoms worsen or fail to improve.   03/30/2021 SGrier MittsMD - Patient was seen for Atopic dermatitis and additional issues. Started Triamcinolone Acetonide 0.1% apply twice daily. Follow up in 1 month if symptoms worsen or fail to improve.   Recent consult visits: 08/15/21 Cancer center nurse evaluation  07/26/2021 SModesto CharonMD (Cardiothoracic surgery) - Patient was seen for non small cell lung cancer, right.  No medication changes. No follow up noted.   07/21/2021 MCurt BearsMD (oncology) - Patient was seen for non small cell lung cancer, right. No medication changes. Follow up 6 months.  06/22/21 Patient presented for PFTs at pulmonary office.   06/22/2021 BJune LeapDO (Pulmonary Disease) - Patient was seen for non small cell lung cancer, right and additional issues. No medication changes. Follow up in 4-6 months.   06/21/2021 Cassandra Heilingoetter PA-C(oncology) - Patient was seen for non small cell  lung cancer, right and additional issues. Discontinued Furosemide 20 mg. Follow up in 1 month.   06/08/2021 [name] DO (Pulmonary Disease) - Patient was seen for non small cell lung cancer, right and additional issues. Discontinued  Capsaicin 0.075% and Triamcinolone Acetonide 0.5%. Follow up in 2 weeks.   05/31/2021 Curt Bears MD (oncology) - Patient was seen for Malignant neoplasm of unspecified part of unspecified bronchus or lung and an additional issue. No medication changes.  Follow up in 2 weeks.    03/15/2021 [name] MD (Vascular surgery) - Patient was seen for PAD. No medication changes. Follow up in 1 year.  Hospital visits: None in previous 6 months   Objective:  Lab Results  Component Value Date   CREATININE 0.78 07/21/2021   BUN 15 07/21/2021   GFR 75.03 05/12/2021   GFRNONAA >60 07/21/2021   GFRAA >60 11/24/2019   NA 141 07/21/2021   K 4.0 07/21/2021   CALCIUM 10.2 07/21/2021   CO2 30 07/21/2021   GLUCOSE 93 07/21/2021    Lab Results  Component Value Date/Time   HGBA1C 4.5 (L) 01/29/2020 11:18 AM   GFR 75.03 05/12/2021 11:19 AM   GFR 104.38 03/11/2020 02:42 PM    Last diabetic Eye exam: No results found for: HMDIABEYEEXA  Last diabetic Foot exam: No results found for: HMDIABFOOTEX   Lab Results  Component Value Date   CHOL 199 01/29/2020   HDL 98.90 01/29/2020   LDLCALC 88 01/29/2020   LDLDIRECT 143.2 03/14/2011   TRIG 60.0 01/29/2020   CHOLHDL 2 01/29/2020    Hepatic Function Latest Ref Rng & Units 07/21/2021 06/21/2021 05/31/2021  Total Protein 6.5 - 8.1 g/dL 7.9 8.0 8.3(H)  Albumin 3.5 - 5.0 g/dL 4.0 4.0 4.3  AST 15 - 41 U/L '23 22 25  ' ALT 0 - 44 U/L '16 15 17  ' Alk Phosphatase 38 - 126 U/L 72 68 71  Total Bilirubin 0.3 - 1.2 mg/dL 1.1 0.8 0.9  Bilirubin, Direct 0.0 - 0.3 mg/dL - - -    Lab Results  Component Value Date/Time   TSH 2.35 01/29/2020 11:18 AM   TSH 1.89 10/24/2017 12:14 PM   FREET4 1.00 01/29/2020 11:18 AM    CBC Latest Ref Rng & Units 07/21/2021 06/21/2021 05/31/2021  WBC 4.0 - 10.5 K/uL 10.7(H) 8.5 9.0  Hemoglobin 12.0 - 15.0 g/dL 11.5(L) 12.2 13.0  Hematocrit 36.0 - 46.0 % 35.9(L) 37.5 39.0  Platelets 150 - 400 K/uL 190 188 180    Lab  Results  Component Value Date/Time   VD25OH >120 03/11/2020 02:42 PM    Clinical ASCVD: Yes  The ASCVD Risk score (Arnett DK, et al., 2019) failed to calculate for the following reasons:   The 2019 ASCVD risk score is only valid for ages 94 to 7    Depression screen PHQ 2/9 10/26/2020 01/29/2020 10/06/2019  Decreased Interest 0 0 0  Down, Depressed, Hopeless 0 0 0  PHQ - 2 Score 0 0 0  Altered sleeping 0 - -  Tired, decreased energy 0 - -  Change in appetite 0 - -  Feeling bad or failure about yourself  0 - -  Trouble concentrating 0 - -  Moving slowly or fidgety/restless 0 - -  Suicidal thoughts 0 - -  PHQ-9 Score 0 - -  Difficult doing work/chores Not difficult at all - -  Some recent data might be hidden      Social History   Tobacco Use  Smoking Status Former  Packs/day: 1.00   Years: 50.00   Pack years: 50.00   Types: Cigarettes   Start date: 1966  Smokeless Tobacco Never  Tobacco Comments   Stopped smoking 07/10/2021   BP Readings from Last 3 Encounters:  08/15/21 (!) 153/68  07/26/21 (!) 156/71  07/21/21 (!) 152/78   Pulse Readings from Last 3 Encounters:  08/15/21 78  07/26/21 94  07/21/21 87   Wt Readings from Last 3 Encounters:  08/15/21 125 lb 6 oz (56.9 kg)  07/26/21 122 lb (55.3 kg)  07/21/21 122 lb 12.8 oz (55.7 kg)   BMI Readings from Last 3 Encounters:  08/15/21 22.21 kg/m  07/26/21 21.61 kg/m  07/21/21 21.75 kg/m    Assessment/Interventions: Review of patient past medical history, allergies, medications, health status, including review of consultants reports, laboratory and other test data, was performed as part of comprehensive evaluation and provision of chronic care management services.   SDOH:  (Social Determinants of Health) assessments and interventions performed: Yes SDOH Interventions    Flowsheet Row Most Recent Value  SDOH Interventions   Financial Strain Interventions Intervention Not Indicated  Transportation  Interventions Intervention Not Indicated      SDOH Screenings   Alcohol Screen: Low Risk    Last Alcohol Screening Score (AUDIT): 4  Depression (PHQ2-9): Low Risk    PHQ-2 Score: 0  Financial Resource Strain: Low Risk    Difficulty of Paying Living Expenses: Not very hard  Food Insecurity: No Food Insecurity   Worried About Charity fundraiser in the Last Year: Never true   Ran Out of Food in the Last Year: Never true  Housing: Low Risk    Last Housing Risk Score: 0  Physical Activity: Inactive   Days of Exercise per Week: 0 days   Minutes of Exercise per Session: 0 min  Social Connections: Moderately Integrated   Frequency of Communication with Friends and Family: More than three times a week   Frequency of Social Gatherings with Friends and Family: Twice a week   Attends Religious Services: More than 4 times per year   Active Member of [employer] or Organizations: Yes   Attends Music therapist: More than 4 times per year   Marital Status: Divorced  Stress: No Stress Concern Present   Feeling of Stress : Not at all  Tobacco Use: Medium Risk   Smoking Tobacco Use: Former   Smokeless Tobacco Use: Never   Passive Exposure: Not on Pensions consultant Needs: No Transportation Needs   Lack of Transportation (Medical): No   Lack of Transportation (Non-Medical): No   Patient's birthday is today and so far she went to the grocery store, her friend bought her a gift, and she has received some cards and money from family and friends.  Patient has a lot of doctor's appointments lately. She usually stays in bed until 10am and is not driving anymore. She had stopped driving because of her leg problems and intends to get the tires for it. Patient has a friend who takes her places.  Patient is able to do daily activities of living right now. All of her family lives in [place] but doesn't see them often. Patient hasn't been able to get back to church since the pandemic and does a  conference call for this.   Patient doesn't read as much as she used to and stopped getting the newspaper in April.  For the past year, her sleeping has changed and she has trouble with staying  asleep. She usually goes to bed around 11:30 and them wakes up around 2-4 am and can't usually fall back asleep. Patient has been having crazy dreams and thinks this is due to the nicotine patch.  Patient stopped smoking September the 25th and is using the nicotine patch every day. Patient is not doing so well with the lozenges because she doesn't like the taste. Patient reports the desire is still there. Patient stopped because she developed lung cancer. She went to a doctor who recommended surgery and she did not want to do this but is going to pursue radiation.  Patient cost of Eliquis is a lot for her. Patient was not doing traveling prior to her blood clot but is curious about how long she needs to take this.   Patient is also starting 3 more treatments with bladder cancer in December and is looking forward to finishing this out.  CCM Care Plan  Allergies  Allergen Reactions   Doxycycline Palpitations    Medications Reviewed Today     Reviewed by Viona Gilmore, [facility] (Pharmacist) on 08/17/21 at [place] List Status: <None>   Medication Order Taking? Sig Documenting Provider Last Dose Status Informant  apixaban (ELIQUIS) 5 MG TABS tablet [id number] Yes Take 1 tablet (5 mg total) by mouth 2 (two) times daily. Billie Ruddy, MD Taking Active Self  atorvastatin (LIPITOR) 10 MG tablet [id number] Yes Take 1 tablet (10 mg total) by mouth daily. Marty Heck, MD Taking Active Self  calcium carbonate (OSCAL) 1500 (600 Ca) MG TABS tablet [id number] Yes Take 1,500 mg by mouth 2 (two) times daily with a meal. [provider] Taking Active Self  Cholecalciferol (VITAMIN D) 50 MCG (2000 UT) CAPS [id number] Yes Take 5,000 Units by mouth daily.  [provider] Taking Active Self   KLOR-CON M20 20 MEQ tablet [id number] Yes TAKE 1 TABLET BY MOUTH EVERY DAY Billie Ruddy, MD Taking Active Self  lisinopril-hydrochlorothiazide (ZESTORETIC) 20-25 MG tablet [id number] Yes TAKE 1 TABLET BY MOUTH EVERY DAY Billie Ruddy, MD Taking Active   Melatonin 5 MG CAPS [id number] Yes Take 5 mg by mouth at bedtime as needed (sleep). [provider] Taking Active Self  nicotine (NICODERM CQ - DOSED IN MG/24 HOURS) 14 mg/24hr patch [id number] Yes Place 14 mg onto the skin daily. [provider] Taking Active   nicotine polacrilex (COMMIT) 2 MG lozenge [id number] Yes Take 2 mg by mouth as needed for smoking cessation. [provider] Taking Active   omeprazole (PRILOSEC) 20 MG capsule [id number] Yes TAKE 1 CAPSULE BY MOUTH EVERY DAY Billie Ruddy, MD Taking Active Self            Patient Active Problem List   Diagnosis Date Noted   Non-small cell lung cancer, right ([place]) 06/21/2021   Endobronchial cancer, right ([place])    Aortic atherosclerosis ([place]) 06/09/2021   Chronic deep vein thrombosis (DVT) of popliteal vein of left lower extremity ([place]) 06/09/2021   Right middle lobe pulmonary nodule 05/31/2021   Polycythemia 01/30/2020   Sinus tachycardia 01/30/2020   SOB (shortness of breath) on exertion 01/30/2020   Hypercalcemia 01/30/2020   PAD (peripheral artery disease) ([place]) 07/29/2019   Tobacco abuse 03/14/2012   LIVER FUNCTION TESTS, ABNORMAL, HX OF 09/21/2009   Dyslipidemia 09/20/2009   ALLERGIC RHINITIS 09/20/2009   Essential hypertension 06/06/2007   COLONIC POLYPS, HX OF 06/06/2007    Immunization History  Administered Date(s) Administered   Fluad Quad(high Dose 65+)  07/16/2020   Influenza Split 08/24/2013   Influenza Whole 08/16/1997   Influenza, High Dose Seasonal PF 10/24/2017, 08/21/2018, 07/29/2019, 07/29/2019, 07/16/2020, 07/21/2021   Influenza-Unspecified 09/06/2014, 08/07/2015, 08/21/2018   PFIZER(Purple Top)SARS-COV-2 Vaccination  11/11/2019, 12/12/2019, 07/29/2020   Pfizer Covid-19 Vaccine Bivalent Booster 67yr & up 07/25/2021   Pneumococcal Conjugate-13 10/30/2013   Pneumococcal Polysaccharide-23 10/16/2008   Tdap 03/14/2011    Conditions to be addressed/monitored:  Hypertension, Hyperlipidemia, GERD, Tobacco use, Allergic Rhinitis, and PAD  Care Plan : CIron Updates made by PViona Gilmore RDrydensince 08/18/2021 12:00 AM     Problem: Problem:Hypertension, Hyperlipidemia, GERD, Tobacco use, Allergic Rhinitis, and PAD      Long-Range Goal: Patient-Specific Goal   Start Date: 08/17/2021  Expected End Date: 08/17/2022  This Visit's Progress: On track  Priority: High  Note:   Current Barriers:  Unable to independently monitor therapeutic efficacy Unable to achieve control of cholesterol  Unable to maintain control of blood pressure  Pharmacist Clinical Goal(s):  Patient will achieve adherence to monitoring guidelines and medication adherence to achieve therapeutic efficacy achieve control of cholesterol as evidenced by next lipid panel maintain control of blood pressure as evidenced by blood pressure readings  through collaboration with PharmD and provider.   Interventions: 1:1 collaboration with BBillie Ruddy MD regarding development and update of comprehensive plan of care as evidenced by provider attestation and co-signature Inter-disciplinary care team collaboration (see longitudinal plan of care) Comprehensive medication review performed; medication list updated in electronic medical record  Hypertension (BP goal <140/90) -Uncontrolled -Current treatment: Lisinopril-HCTZ 20-25 mg 1 tablet daily -Medications previously tried: unknown  -Current home readings: does not check at home and does not own a cuff -Current dietary habits: does use a little; does eat out some (at least once a week); looks package labels  -Current exercise habits: does not do exercise  -Denies  hypotensive/hypertensive symptoms -Educated on BP goals and benefits of medications for prevention of heart attack, stroke and kidney damage; Exercise goal of 150 minutes per week; Importance of home blood pressure monitoring; Proper BP monitoring technique; -Counseled to monitor BP at home weekly, document, and provide log at future appointments -Counseled on diet and exercise extensively Recommended to continue current medication Recommended purchasing a BP cuff to check at home.   Hyperlipidemia: (LDL goal < 70) -Uncontrolled -Current treatment: Atorvastatin 10 mg 1 tablet daily -Medications previously tried: none  -Current dietary patterns:  very little fried foods; uses margarine with vegetables -Current exercise habits: does not exercise -Educated on Cholesterol goals;  Benefits of statin for ASCVD risk reduction; Importance of limiting foods high in cholesterol; -Counseled on diet and exercise extensively Recommended repeat lipid panel and consider dose escalation if LDL > 70.  PAD (Goal: prevent heart events) -Not ideally controlled -Current treatment  Eliquis 5 mg 1 tablet twice daily Atorvastatin 10 mg 1 tablet daily -Medications previously tried: none  -Counseled on importance of separating doses by 12 hours for Eliquis. Recommended increasing statin to high intensity.  Tobacco use (Goal quit smoking) -Controlled -Previous quit attempts: none -Current treatment  Nicotine patch 14 mg place 1 patch daily -Patient smokes After 30 minutes of waking -Patient triggers include:  enjoyment -On a scale of 1-10, reports MOTIVATION to quit is 10 -On a scale of 1-10, reports CONFIDENCE in quitting is 10 -Counseled on patch placement, side effects, and option to remove at night if they experience trouble sleeping or bad dreams.  Provided contact information for  [place] Quit Line (1-800-QUIT-NOW) and encouraged patient to reach out to this group for support. -Recommended to continue  current medication  DVT (Goal: prevent blood clots) -Controlled -Current treatment  Eliquis 5 mg 1 tablet twice daily -Medications previously tried: none  -Educated on signs and symptoms of a blood clot. Collaborated with PCP to determine duration of therapy.  GERD (Goal: minimize symptoms of heartburn) -Controlled -Current treatment  Omeprazole 20 mg 1 capsule daily -Medications previously tried: none  -Counseled on non-pharmacologic management of symptoms such as elevating the head of your bed, avoiding eating 2-3 hours before bed, avoiding triggering foods such as acidic, spicy, or fatty foods, eating smaller meals, and wearing clothes that are loose around the waist Discussed the long term risks of taking PPIs and consider taper.   Osteoporosis (Goal prevent fractures) -Controlled -Last DEXA Scan: 05/07/2019   T-Score femoral neck: -2.2  T-Score total hip: n/a  T-Score lumbar spine: 1.3  T-Score forearm radius: n/a  10-year probability of major osteoporotic fracture: 9.0%4  10-year probability of hip fracture: 4.7% -Patient is a candidate for pharmacologic treatment due to T-Score -1.0 to -2.5 and 10-year risk of hip fracture > 3% -Current treatment  Calcium carbonate & vitamin D 600 mg twice daily (total 1200 mg and vitamin D 1000 units) Vitamin D 2000 units daily -Medications previously tried: none  -Recommend [phone] units of vitamin D daily. Recommend 1200 mg of calcium daily from dietary and supplemental sources. -Recommended repeat vitamin D level. Consider repeat DEXA.  Health Maintenance -Vaccine gaps: shingrix, tetanus, COVID vaccine -Current therapy:  Melatonin 5 mg 1 capsule at bedtime  Potassium chloride 20 mEq daily -Educated on Cost vs benefit of each product must be carefully weighed by individual consumer -Patient is satisfied with current therapy and denies issues -Recommended timed release melatonin and avoiding screen time before bed.  Patient  Goals/Self-Care Activities Patient will:  - take medications as prescribed as evidenced by patient report and record review check blood pressure weekly, document, and provide at future appointments  Follow Up Plan: Telephone follow up appointment with care management team member scheduled for: 4 months      Medication Assistance: None required.  Patient affirms current coverage meets needs.  Compliance/Adherence/Medication fill history: Care Gaps: Shingrix, tetanus, COVID booster Last BP - 156/71 on 07/26/2021 Last A1C - 4.5 on 01/29/2020  Star-Rating Drugs: Atorvastatin 10 mg - last [facility]18/[facility] Lisinopril HCTZ 20/25 mg - last filled [facility]/21/2021 [facility]  [facility]:  [id number] [id number]: [phone]: [phone] Uses pill box? No - only if she is going somewhere Pt endorses 90% compliance - can skip the second calcium on the days  We discussed: Benefits of medication synchronization, packaging and delivery as well as enhanced pharmacist oversight with [employer]. Patient decided to: Continue current medication management strategy  Care Plan and Follow Up Patient Decision:  Patient agrees to Care Plan and Follow-up.  Plan: Telephone follow up appointment with care management team member scheduled for:  4 months  MJeni Salles PharmD, BTierra Verdeat BKing [phone]

## 2021-08-18 NOTE — Patient Instructions (Addendum)
Hi Tracy Davidson,  It was great to get to meet you over the telephone! Below is a summary of some of the topics we discussed.   Here are some of the highlights: -Go ahead and purchase your blood pressure cuff so you can check this at home -Try timed release melatonin to see if this helps with staying asleep -Don't forget to try to take Eliquis doses as close to 12 hours apart as you can  Please reach out to me if you have any questions or need anything before our follow up!  Best, Maddie  Jeni Salles, PharmD, [facility]   Visit Information   Goals Addressed             This Visit's Progress    Track and Manage My Blood Pressure-Hypertension       Timeframe:  Long-Range Goal Priority:  Medium Start Date:                             Expected End Date:                       Follow Up Date 11/17/20    - check blood pressure weekly - choose a place to take my blood pressure (home, clinic or office, retail store) - write blood pressure results in a log or diary    Why is this important?   You won't feel high blood pressure, but it can still hurt your blood vessels.  High blood pressure can cause heart or kidney problems. It can also cause a stroke.  Making lifestyle changes like losing a little weight or eating less salt will help.  Checking your blood pressure at home and at different times of the day can help to control blood pressure.  If the doctor prescribes medicine remember to take it the way the doctor ordered.  Call the office if you cannot afford the medicine or if there are questions about it.     Notes:        Patient Care Plan: CCM Pharmacy Care Plan     Problem Identified: Problem:Hypertension, Hyperlipidemia, GERD, Tobacco use, Allergic Rhinitis, and PAD      Long-Range Goal: Patient-Specific Goal   Start Date: 08/17/2021  Expected End Date: 08/17/2022  This Visit's Progress: On track   Priority: High  Note:   Current Barriers:  Unable to independently monitor therapeutic efficacy Unable to achieve control of cholesterol  Unable to maintain control of blood pressure  Pharmacist Clinical Goal(s):  Patient will achieve adherence to monitoring guidelines and medication adherence to achieve therapeutic efficacy achieve control of cholesterol as evidenced by next lipid panel maintain control of blood pressure as evidenced by blood pressure readings  through collaboration with PharmD and provider.   Interventions: 1:1 collaboration with Billie Ruddy, MD regarding development and update of comprehensive plan of care as evidenced by provider attestation and co-signature Inter-disciplinary care team collaboration (see longitudinal plan of care) Comprehensive medication review performed; medication list updated in electronic medical record  Hypertension (BP goal <140/90) -Uncontrolled -Current treatment: Lisinopril-HCTZ 20-25 mg 1 tablet daily -Medications previously tried: unknown  -Current home readings: does not check at home and does not own a cuff -Current dietary habits: does use a little; does eat out some (at least once a week); looks package labels  -Current exercise habits: does not do exercise  -Denies hypotensive/hypertensive symptoms -  Educated on BP goals and benefits of medications for prevention of heart attack, stroke and kidney damage; Exercise goal of 150 minutes per week; Importance of home blood pressure monitoring; Proper BP monitoring technique; -Counseled to monitor BP at home weekly, document, and provide log at future appointments -Counseled on diet and exercise extensively Recommended to continue current medication Recommended purchasing a BP cuff to check at home.   Hyperlipidemia: (LDL goal < 70) -Uncontrolled -Current treatment: Atorvastatin 10 mg 1 tablet daily -Medications previously tried: none  -Current dietary patterns:  very  little fried foods; uses margarine with vegetables -Current exercise habits: does not exercise -Educated on Cholesterol goals;  Benefits of statin for ASCVD risk reduction; Importance of limiting foods high in cholesterol; -Counseled on diet and exercise extensively Recommended repeat lipid panel and consider dose escalation if LDL > 70.  PAD (Goal: prevent heart events) -Not ideally controlled -Current treatment  Eliquis 5 mg 1 tablet twice daily Atorvastatin 10 mg 1 tablet daily -Medications previously tried: none  -Counseled on importance of separating doses by 12 hours for Eliquis. Recommended increasing statin to high intensity.  Tobacco use (Goal quit smoking) -Controlled -Previous quit attempts: none -Current treatment  Nicotine patch 14 mg place 1 patch daily -Patient smokes After 30 minutes of waking -Patient triggers include:  enjoyment -On a scale of 1-10, reports MOTIVATION to quit is 10 -On a scale of 1-10, reports CONFIDENCE in quitting is 10 -Counseled on patch placement, side effects, and option to remove at night if they experience trouble sleeping or bad dreams.  Provided contact information for [place] [facility]uit Line (1-800-QUIT-NOW) and encouraged patient to reach out to this group for support. -Recommended to continue current medication  DVT (Goal: prevent blood clots) -Controlled -Current treatment  Eliquis 5 mg 1 tablet twice daily -Medications previously tried: none  -Educated on signs and symptoms of a blood clot. Collaborated with PCP to determine duration of therapy.  GERD (Goal: minimize symptoms of heartburn) -Controlled -Current treatment  Omeprazole 20 mg 1 capsule daily -Medications previously tried: none  -Counseled on non-pharmacologic management of symptoms such as elevating the head of your bed, avoiding eating 2-3 hours before bed, avoiding triggering foods such as acidic, spicy, or fatty foods, eating smaller meals, and wearing clothes that are  loose around the waist Discussed the long term risks of taking PPIs and consider taper.   Osteoporosis (Goal prevent fractures) -Controlled -Last DEXA Scan: 05/07/2019   T-Score femoral neck: -2.2  T-Score total hip: n/a  T-Score lumbar spine: 1.3  T-Score forearm radius: n/a  10-year probability of major osteoporotic fracture: 9.0%4  10-year probability of hip fracture: 4.7% -Patient is a candidate for pharmacologic treatment due to T-Score -1.0 to -2.5 and 10-year risk of hip fracture > 3% -Current treatment  Calcium carbonate & vitamin D 600 mg twice daily (total 1200 mg and vitamin D 1000 units) Vitamin D 2000 units daily -Medications previously tried: none  -Recommend [phone] units of vitamin D daily. Recommend 1200 mg of calcium daily from dietary and supplemental sources. -Recommended repeat vitamin D level. Consider repeat DEXA.  Health Maintenance -Vaccine gaps: shingrix, tetanus, COVID vaccine -Current therapy:  Melatonin 5 mg 1 capsule at bedtime  Potassium chloride 20 mEq daily -Educated on Cost vs benefit of each product must be carefully weighed by individual consumer -Patient is satisfied with current therapy and denies issues -Recommended timed release melatonin and avoiding screen time before bed.  Patient Goals/Self-Care Activities Patient will:  - take  medications as prescribed as evidenced by patient report and record review check blood pressure weekly, document, and provide at future appointments  Follow Up Plan: Telephone follow up appointment with care management team member scheduled for: 4 months      Ms. Missey was given information about Chronic Care Management services today including:  CCM service includes personalized support from designated clinical staff supervised by her physician, including individualized plan of care and coordination with other care providers 24/7 contact phone numbers for assistance for urgent and routine care  needs. Standard insurance, coinsurance, copays and deductibles apply for chronic care management only during months in which we provide at least 20 minutes of these services. Most insurances cover these services at 100%, however patients may be responsible for any copay, coinsurance and/or deductible if applicable. This service may help you avoid the need for more expensive face-to-face services. Only one practitioner may furnish and bill the service in a calendar month. The patient may stop CCM services at any time (effective at the end of the month) by phone call to the office staff.  Patient agreed to services and verbal consent obtained.   The patient verbalized understanding of instructions, educational materials, and care plan provided today and agreed to receive a mailed copy of patient instructions, educational materials, and care plan.  Telephone follow up appointment with pharmacy team member scheduled for: 4 months  Viona Gilmore, [facility]

## 2021-08-22 ENCOUNTER — Ambulatory Visit
Admission: RE | Admit: 2021-08-22 | Discharge: 2021-08-22 | Disposition: A | Discharge status: Home / Self Care | Payer: Medicare Other | Source: Ambulatory Visit | Attending: Radiation Oncology | Admitting: Radiation Oncology

## 2021-08-22 ENCOUNTER — Other Ambulatory Visit: Payer: Self-pay

## 2021-08-22 DIAGNOSIS — Z510 Encounter for antineoplastic radiation therapy: Secondary | ICD-10-CM | POA: Diagnosis not present

## 2021-08-22 DIAGNOSIS — C3491 Malignant neoplasm of unspecified part of right bronchus or lung: Secondary | ICD-10-CM

## 2021-08-22 DIAGNOSIS — C342 Malignant neoplasm of middle lobe, bronchus or lung: Secondary | ICD-10-CM | POA: Insufficient documentation

## 2021-08-25 ENCOUNTER — Telehealth: Payer: Self-pay

## 2021-08-25 NOTE — Telephone Encounter (Signed)
Pt called wanting to cancel her appt with Dr. Julien Nordmann on Monday 08/29/21. She states she wants to wait until she is done with Dr. Clabe Seal treatment to return.

## 2021-08-29 ENCOUNTER — Inpatient Hospital Stay: Payer: Medicare Other | Admitting: Internal Medicine

## 2021-08-29 ENCOUNTER — Inpatient Hospital Stay: Payer: Medicare Other | Attending: Internal Medicine

## 2021-09-01 ENCOUNTER — Other Ambulatory Visit: Payer: Self-pay

## 2021-09-01 ENCOUNTER — Ambulatory Visit
Admission: RE | Admit: 2021-09-01 | Discharge: 2021-09-01 | Disposition: A | Discharge status: Home / Self Care | Payer: Medicare Other | Source: Ambulatory Visit | Attending: Radiation Oncology | Admitting: Radiation Oncology

## 2021-09-01 DIAGNOSIS — C3491 Malignant neoplasm of unspecified part of right bronchus or lung: Secondary | ICD-10-CM | POA: Diagnosis not present

## 2021-09-01 DIAGNOSIS — Z510 Encounter for antineoplastic radiation therapy: Secondary | ICD-10-CM | POA: Diagnosis not present

## 2021-09-01 DIAGNOSIS — C342 Malignant neoplasm of middle lobe, bronchus or lung: Secondary | ICD-10-CM | POA: Diagnosis not present

## 2021-09-02 ENCOUNTER — Ambulatory Visit
Admission: RE | Admit: 2021-09-02 | Discharge: 2021-09-02 | Disposition: A | Discharge status: Home / Self Care | Payer: Medicare Other | Source: Ambulatory Visit | Attending: Radiation Oncology | Admitting: Radiation Oncology

## 2021-09-02 DIAGNOSIS — Z510 Encounter for antineoplastic radiation therapy: Secondary | ICD-10-CM | POA: Diagnosis not present

## 2021-09-02 DIAGNOSIS — C342 Malignant neoplasm of middle lobe, bronchus or lung: Secondary | ICD-10-CM | POA: Diagnosis not present

## 2021-09-02 DIAGNOSIS — C3491 Malignant neoplasm of unspecified part of right bronchus or lung: Secondary | ICD-10-CM | POA: Diagnosis not present

## 2021-09-05 ENCOUNTER — Other Ambulatory Visit: Payer: Self-pay | Admitting: Family Medicine

## 2021-09-05 ENCOUNTER — Ambulatory Visit
Admission: RE | Admit: 2021-09-05 | Discharge: 2021-09-05 | Disposition: A | Discharge status: Home / Self Care | Payer: Medicare Other | Source: Ambulatory Visit | Attending: Radiation Oncology | Admitting: Radiation Oncology

## 2021-09-05 ENCOUNTER — Other Ambulatory Visit: Payer: Self-pay

## 2021-09-05 DIAGNOSIS — Z510 Encounter for antineoplastic radiation therapy: Secondary | ICD-10-CM | POA: Diagnosis not present

## 2021-09-05 DIAGNOSIS — C3491 Malignant neoplasm of unspecified part of right bronchus or lung: Secondary | ICD-10-CM

## 2021-09-05 DIAGNOSIS — C342 Malignant neoplasm of middle lobe, bronchus or lung: Secondary | ICD-10-CM | POA: Diagnosis not present

## 2021-09-05 DIAGNOSIS — I82532 Chronic embolism and thrombosis of left popliteal vein: Secondary | ICD-10-CM

## 2021-09-06 ENCOUNTER — Ambulatory Visit
Admission: RE | Admit: 2021-09-06 | Discharge: 2021-09-06 | Disposition: A | Discharge status: Home / Self Care | Payer: Medicare Other | Source: Ambulatory Visit | Attending: Radiation Oncology | Admitting: Radiation Oncology

## 2021-09-06 DIAGNOSIS — C342 Malignant neoplasm of middle lobe, bronchus or lung: Secondary | ICD-10-CM | POA: Diagnosis not present

## 2021-09-06 DIAGNOSIS — Z510 Encounter for antineoplastic radiation therapy: Secondary | ICD-10-CM | POA: Diagnosis not present

## 2021-09-06 DIAGNOSIS — C3491 Malignant neoplasm of unspecified part of right bronchus or lung: Secondary | ICD-10-CM

## 2021-09-07 ENCOUNTER — Other Ambulatory Visit: Payer: Self-pay

## 2021-09-07 ENCOUNTER — Ambulatory Visit
Admission: RE | Admit: 2021-09-07 | Discharge: 2021-09-07 | Disposition: A | Discharge status: Home / Self Care | Payer: Medicare Other | Source: Ambulatory Visit | Attending: Radiation Oncology | Admitting: Radiation Oncology

## 2021-09-07 DIAGNOSIS — Z510 Encounter for antineoplastic radiation therapy: Secondary | ICD-10-CM | POA: Diagnosis not present

## 2021-09-07 DIAGNOSIS — C3491 Malignant neoplasm of unspecified part of right bronchus or lung: Secondary | ICD-10-CM | POA: Diagnosis not present

## 2021-09-07 DIAGNOSIS — C342 Malignant neoplasm of middle lobe, bronchus or lung: Secondary | ICD-10-CM | POA: Diagnosis not present

## 2021-09-12 ENCOUNTER — Other Ambulatory Visit: Payer: Self-pay

## 2021-09-12 ENCOUNTER — Ambulatory Visit
Admission: RE | Admit: 2021-09-12 | Discharge: 2021-09-12 | Disposition: A | Discharge status: Home / Self Care | Payer: Medicare Other | Source: Ambulatory Visit | Attending: Radiation Oncology | Admitting: Radiation Oncology

## 2021-09-12 DIAGNOSIS — Z510 Encounter for antineoplastic radiation therapy: Secondary | ICD-10-CM | POA: Diagnosis not present

## 2021-09-12 DIAGNOSIS — C342 Malignant neoplasm of middle lobe, bronchus or lung: Secondary | ICD-10-CM | POA: Diagnosis not present

## 2021-09-12 DIAGNOSIS — C3491 Malignant neoplasm of unspecified part of right bronchus or lung: Secondary | ICD-10-CM | POA: Diagnosis not present

## 2021-09-13 ENCOUNTER — Ambulatory Visit
Admission: RE | Admit: 2021-09-13 | Discharge: 2021-09-13 | Disposition: A | Discharge status: Home / Self Care | Payer: Medicare Other | Source: Ambulatory Visit | Attending: Radiation Oncology | Admitting: Radiation Oncology

## 2021-09-13 DIAGNOSIS — Z510 Encounter for antineoplastic radiation therapy: Secondary | ICD-10-CM | POA: Diagnosis not present

## 2021-09-13 DIAGNOSIS — C3491 Malignant neoplasm of unspecified part of right bronchus or lung: Secondary | ICD-10-CM

## 2021-09-13 DIAGNOSIS — C342 Malignant neoplasm of middle lobe, bronchus or lung: Secondary | ICD-10-CM | POA: Diagnosis not present

## 2021-09-14 ENCOUNTER — Other Ambulatory Visit: Payer: Self-pay

## 2021-09-14 ENCOUNTER — Ambulatory Visit
Admission: RE | Admit: 2021-09-14 | Discharge: 2021-09-14 | Disposition: A | Discharge status: Home / Self Care | Payer: Medicare Other | Source: Ambulatory Visit | Attending: Radiation Oncology | Admitting: Radiation Oncology

## 2021-09-14 DIAGNOSIS — Z510 Encounter for antineoplastic radiation therapy: Secondary | ICD-10-CM | POA: Diagnosis not present

## 2021-09-14 DIAGNOSIS — I10 Essential (primary) hypertension: Secondary | ICD-10-CM

## 2021-09-14 DIAGNOSIS — C3491 Malignant neoplasm of unspecified part of right bronchus or lung: Secondary | ICD-10-CM | POA: Diagnosis not present

## 2021-09-14 DIAGNOSIS — C342 Malignant neoplasm of middle lobe, bronchus or lung: Secondary | ICD-10-CM | POA: Diagnosis not present

## 2021-09-15 ENCOUNTER — Ambulatory Visit
Admission: RE | Admit: 2021-09-15 | Discharge: 2021-09-15 | Disposition: A | Discharge status: Home / Self Care | Payer: Medicare Other | Source: Ambulatory Visit | Attending: Radiation Oncology | Admitting: Radiation Oncology

## 2021-09-15 DIAGNOSIS — C3491 Malignant neoplasm of unspecified part of right bronchus or lung: Secondary | ICD-10-CM | POA: Insufficient documentation

## 2021-09-16 ENCOUNTER — Ambulatory Visit
Admission: RE | Admit: 2021-09-16 | Discharge: 2021-09-16 | Disposition: A | Discharge status: Home / Self Care | Payer: Medicare Other | Source: Ambulatory Visit | Attending: Radiation Oncology | Admitting: Radiation Oncology

## 2021-09-16 ENCOUNTER — Other Ambulatory Visit: Payer: Self-pay

## 2021-09-16 ENCOUNTER — Encounter: Payer: Self-pay | Admitting: Radiation Oncology

## 2021-09-16 DIAGNOSIS — C3491 Malignant neoplasm of unspecified part of right bronchus or lung: Secondary | ICD-10-CM | POA: Diagnosis not present

## 2021-09-19 ENCOUNTER — Telehealth: Payer: Self-pay | Admitting: Family Medicine

## 2021-09-19 ENCOUNTER — Ambulatory Visit: Payer: Medicare Other | Admitting: Radiation Oncology

## 2021-09-19 DIAGNOSIS — L209 Atopic dermatitis, unspecified: Secondary | ICD-10-CM

## 2021-09-19 DIAGNOSIS — L308 Other specified dermatitis: Secondary | ICD-10-CM

## 2021-09-19 NOTE — Telephone Encounter (Signed)
Patient called because she is taking vitamin D3 and has ran out, patient wanted to know if Dr.Banks still wanted her to continue to take it. Patient also states she has an eczema flare up and she needs something for the itch. Patient states whatever the cream and the ointment was, that was sent in previously, do not work for her.    Please send to  [id number] - [place], [place] Phone:  [phone]  Fax:  [phone]        Good callback number is [phone]   Please advise

## 2021-09-20 ENCOUNTER — Ambulatory Visit: Payer: Medicare Other | Admitting: Radiation Oncology

## 2021-09-20 NOTE — Telephone Encounter (Signed)
Vitamin D3 not prescribed by PCP, can take OTC vit D. PCP is aware pt stated cream does not help, Derm referral placed.

## 2021-09-21 ENCOUNTER — Ambulatory Visit: Payer: Medicare Other | Admitting: Radiation Oncology

## 2021-09-22 ENCOUNTER — Ambulatory Visit: Payer: Medicare Other | Admitting: Radiation Oncology

## 2021-09-23 ENCOUNTER — Ambulatory Visit: Payer: Medicare Other | Admitting: Radiation Oncology

## 2021-10-15 ENCOUNTER — Other Ambulatory Visit: Payer: Self-pay | Admitting: Family Medicine

## 2021-10-15 DIAGNOSIS — I10 Essential (primary) hypertension: Secondary | ICD-10-CM

## 2021-10-18 ENCOUNTER — Encounter: Payer: Self-pay | Admitting: Radiation Oncology

## 2021-10-20 ENCOUNTER — Ambulatory Visit: Payer: Self-pay | Admitting: Radiation Oncology

## 2021-10-22 NOTE — Progress Notes (Incomplete)
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 Patient reports having chronic pain to bilateral legs, mild fatigue, and dry cough. She denies swallowing issues, shortness of breath, or hemoptysis. Skin remains intact. Appetite is fair.  Plan: The patient will follow-up with radiation oncology in one month .  ________________________________________________ -----------------------------------  Blair Promise, PhD, MD  This document serves as a record of services personally performed by [name], MD. It was created on his behalf by Roney Mans, a trained medical scribe. The creation of this record is based on the scribe's personal observations and the provider's statements to them. This document has been checked and approved by the attending provider.

## 2021-10-23 NOTE — Progress Notes (Signed)
Radiation Oncology         [phone] ________________________________  Name: Tracy Davidson MRN: [id number]  Date: 10/24/2021  DOB: 24-Nov-1940  Follow-Up Visit Note  CC: Billie Ruddy, MD  Curt Bears, MD    ICD-10-CM   1. Non-small cell lung cancer, right [facility])  C34.91 CT CHEST WO CONTRAST    2. 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 The patient tolerated radiation therapy relatively well other than reports of chronic pain to bilateral lower extremities, mild fatigue, and dry cough. She denied swallowing issues, shortness of breath, or hemoptysis. Her skin remained intact and she was able to sustain a healthy appetite.  Otherwise, no significant interval history since the patient was last seen for her initial consultation.   Allergies:  is allergic to doxycycline.  Meds: Current Outpatient Medications  Medication Sig Dispense Refill   atorvastatin (LIPITOR) 10 MG tablet Take 1 tablet (10 mg total) by mouth daily. 90 tablet 3   calcium carbonate (OSCAL) 1500 (600 Ca) MG TABS tablet Take 1,500 mg by mouth 2 (two) times daily with a meal.     Cholecalciferol (VITAMIN D) 50 MCG (2000 UT) CAPS Take 5,000 Units by mouth daily.      ELIQUIS 5 MG TABS tablet TAKE 1 TABLET BY MOUTH TWICE A DAY 60 tablet 2   KLOR-CON M20 20 MEQ tablet TAKE 1 TABLET BY MOUTH EVERY DAY 90 tablet 2   lisinopril-hydrochlorothiazide (ZESTORETIC) 20-25 MG tablet TAKE 1 TABLET BY MOUTH EVERY DAY 90 tablet 0   Melatonin 5 MG CAPS Take 5 mg by mouth at bedtime as needed (sleep).     nicotine  polacrilex (COMMIT) 2 MG lozenge Take 2 mg by mouth as needed for smoking cessation.     omeprazole (PRILOSEC) 20 MG capsule TAKE 1 CAPSULE BY MOUTH EVERY DAY 90 capsule 1   nicotine (NICODERM CQ - DOSED IN MG/24 HOURS) 14 mg/24hr patch Place 14 mg onto the skin daily. (Patient not taking: Reported on 10/24/2021)     No current facility-administered medications for this encounter.    Physical Findings: The patient is in no acute distress. Patient is alert and oriented.  height is 5' 3.5" (1.613 m) and weight is 132 lb 6.4 oz (60.1 kg). Her temperature is 97.3 F (36.3 C) (abnormal). Her blood pressure is 139/72 and her pulse is 79. Her respiration is 20 and oxygen saturation is 99%. .  No significant changes. Lungs are clear to auscultation bilaterally. Heart has regular rate and rhythm. No palpable cervical, supraclavicular, or axillary adenopathy. Abdomen soft, non-tender, normal bowel sounds.    Lab Findings: Lab Results  Component Value Date   WBC 10.7 (H) 07/21/2021   HGB 11.5 (L) 07/21/2021   HCT 35.9 (L) 07/21/2021   MCV 99.4 07/21/2021   PLT 190 07/21/2021    Radiographic Findings: No results found.  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  Overall she tolerated her radiation therapy quite well without any significant side  effects.  Plan: She will undergo a CT scan of the chest to assess her response to radiation therapy.  This will be performed in approximately 3 months.  Follow-up soon afterward to review study and for physical examination.    ____________________________________  Blair Promise, PhD, MD  This document serves as a record of services personally performed by [name], MD. It was created on his behalf by Roney Mans, a trained medical scribe. The creation of this record is based on the scribe's personal observations and the provider's statements  to them. This document has been checked and approved by the attending provider.

## 2021-10-24 ENCOUNTER — Other Ambulatory Visit: Payer: Self-pay

## 2021-10-24 ENCOUNTER — Ambulatory Visit
Admission: RE | Admit: 2021-10-24 | Discharge: 2021-10-24 | Disposition: A | Discharge status: Home / Self Care | Payer: Medicare Other | Source: Ambulatory Visit | Attending: Radiation Oncology | Admitting: Radiation Oncology

## 2021-10-24 ENCOUNTER — Encounter: Payer: Self-pay | Admitting: Radiation Oncology

## 2021-10-24 VITALS — BP 139/72 | HR 79 | Temp 97.3°F | Resp 20 | Ht 63.5 in | Wt 132.4 lb

## 2021-10-24 DIAGNOSIS — Z79899 Other long term (current) drug therapy: Secondary | ICD-10-CM | POA: Diagnosis not present

## 2021-10-24 DIAGNOSIS — Z923 Personal history of irradiation: Secondary | ICD-10-CM | POA: Insufficient documentation

## 2021-10-24 DIAGNOSIS — C3491 Malignant neoplasm of unspecified part of right bronchus or lung: Secondary | ICD-10-CM | POA: Insufficient documentation

## 2021-10-24 DIAGNOSIS — Z7901 Long term (current) use of anticoagulants: Secondary | ICD-10-CM | POA: Diagnosis not present

## 2021-10-24 DIAGNOSIS — R059 Cough, unspecified: Secondary | ICD-10-CM | POA: Diagnosis not present

## 2021-10-24 HISTORY — DX: Personal history of irradiation: Z92.3

## 2021-10-24 NOTE — Progress Notes (Signed)
Tracy Davidson is here today for follow up post radiation to the lung.  Lung Side: right  Completed radiation treatment on: 09/16/2021  Does the patient complain of any of the following: Pain:5-6/10 right leg and hip pain Shortness of breath w/wo exertion: denies Cough: occasional dry cough Hemoptysis: denies Pain with swallowing: denies Swallowing/choking concerns: denies Appetite: fair Energy Level: poor Post radiation skin Changes: denies    Additional comments if applicable: none  Vitals:   10/24/21 1157  BP: 139/72  Pulse: 79  Resp: 20  Temp: (!) 97.3 F (36.3 C)  SpO2: 99%  Weight: 132 lb 6.4 oz (60.1 kg)  Height: 5' 3.5" (1.613 m)

## 2021-10-27 ENCOUNTER — Ambulatory Visit (INDEPENDENT_AMBULATORY_CARE_PROVIDER_SITE_OTHER): Payer: Medicare Other

## 2021-10-27 VITALS — BP 110/62 | Temp 98.0°F | Ht 63.0 in | Wt 120.2 lb

## 2021-10-27 DIAGNOSIS — Z0000 Encounter for general adult medical examination without abnormal findings: Secondary | ICD-10-CM | POA: Diagnosis not present

## 2021-10-27 NOTE — Progress Notes (Signed)
Subjective:   Tracy Davidson is a 81 y.o. female who presents for Medicare Annual (Subsequent) preventive examination.  Review of Systems    No ROS      Objective:    There were no vitals filed for this visit. There is no height or weight on file to calculate BMI.  Advanced Directives 10/24/2021 08/15/2021 06/21/2021 06/14/2021 05/31/2021 02/02/2021 01/24/2021  Does Patient Have a Medical Advance Directive? No No No No No No No  Would patient like information on creating a medical advance directive? No - Patient declined No - Patient declined No - Patient declined No - Patient declined No - Patient declined Yes (MAU/Ambulatory/Procedural Areas - Information given) Yes (MAU/Ambulatory/Procedural Areas - Information given)    Current Medications (verified) Outpatient Encounter Medications as of 10/27/2021  Medication Sig   atorvastatin (LIPITOR) 10 MG tablet Take 1 tablet (10 mg total) by mouth daily.   calcium carbonate (OSCAL) 1500 (600 Ca) MG TABS tablet Take 1,500 mg by mouth 2 (two) times daily with a meal.   Cholecalciferol (VITAMIN D) 50 MCG (2000 UT) CAPS Take 5,000 Units by mouth daily.    ELIQUIS 5 MG TABS tablet TAKE 1 TABLET BY MOUTH TWICE A DAY   KLOR-CON M20 20 MEQ tablet TAKE 1 TABLET BY MOUTH EVERY DAY   lisinopril-hydrochlorothiazide (ZESTORETIC) 20-25 MG tablet TAKE 1 TABLET BY MOUTH EVERY DAY   Melatonin 5 MG CAPS Take 5 mg by mouth at bedtime as needed (sleep).   nicotine (NICODERM CQ - DOSED IN MG/24 HOURS) 14 mg/24hr patch Place 14 mg onto the skin daily. (Patient not taking: Reported on 10/24/2021)   nicotine polacrilex (COMMIT) 2 MG lozenge Take 2 mg by mouth as needed for smoking cessation.   omeprazole (PRILOSEC) 20 MG capsule TAKE 1 CAPSULE BY MOUTH EVERY DAY   No facility-administered encounter medications on file as of 10/27/2021.    Allergies (verified) Doxycycline   History: Past Medical History:  Diagnosis Date   ALLERGIC RHINITIS 09/20/2009   Allergy     Arthritis    LEGS and left arm   Bladder tumor    Cataract    both eyes, right worse thsn left   COLONIC POLYPS, HX OF 06/06/2007   DYSLIPIDEMIA 09/20/2009   Dyspnea    sob with activity   GERD (gastroesophageal reflux disease)    History of radiation therapy    Right lung- 09/01/21-09/16/21- Dr. [name]   HYPERTENSION 06/06/2007   Knee pain    stiff and pain on side   LIVER FUNCTION TESTS, ABNORMAL, HX OF    feb or march 2019   Peripheral vascular disease ([place])    Shingles 11/2019   left arm and shoulder   Past Surgical History:  Procedure Laterality Date   ABDOMINAL AORTOGRAM W/LOWER EXTREMITY Bilateral 03/03/2020   Procedure: ABDOMINAL AORTOGRAM W/LOWER EXTREMITY;  Surgeon: Marty Heck, MD;  Location: [place] [facility];  Service: Cardiovascular;  Laterality: Bilateral;   BREAST EXCISIONAL BIOPSY Left 1975   BRONCHIAL BIOPSY  06/14/2021   Procedure: BRONCHIAL BIOPSIES;  Surgeon: Garner Nash, DO;  Location: [facility];  Service: Pulmonary;;   BRONCHIAL BRUSHINGS  06/14/2021   Procedure: BRONCHIAL BRUSHINGS;  Surgeon: Garner Nash, DO;  Location: [facility];  Service: Pulmonary;;   BRONCHIAL NEEDLE ASPIRATION BIOPSY  06/14/2021   Procedure: BRONCHIAL NEEDLE ASPIRATION BIOPSIES;  Surgeon: Garner Nash, DO;  Location: [place];  Service: Pulmonary;;   COLONOSCOPY  ?2015   CRYOTHERAPY  06/14/2021   Procedure: CRYOTHERAPY;  Surgeon: Garner Nash, DO;  Location: [place];  Service: Pulmonary;;   CYSTOSCOPY WITH BIOPSY N/A 04/12/2020   Procedure: CYSTOSCOPY WITH BIOPSY  INSTILLATION OF GEMCITABINE;  Surgeon: Lucas Mallow, MD;  Location: [facility];  Service: Urology;  Laterality: N/A;   DILATION AND CURETTAGE OF UTERUS  yrs ago   HEMOSTASIS CONTROL  06/14/2021   Procedure: HEMOSTASIS CONTROL;  Surgeon: Garner Nash, DO;  Location: [place];  Service: Pulmonary;;   PERIPHERAL VASCULAR INTERVENTION Right 03/03/2020    Procedure: PERIPHERAL VASCULAR INTERVENTION;  Surgeon: Marty Heck, MD;  Location: [place] [facility];  Service: Cardiovascular;  Laterality: Right;   RADIOACTIVE SEED GUIDED EXCISIONAL BREAST BIOPSY Left 02/02/2021   Procedure: RADIOACTIVE SEED GUIDED EXCISIONAL LEFT BREAST BIOPSY;  Surgeon: Stark Klein, MD;  Location: [place];  Service: General;  Laterality: Left;   [place]  yrs ago   complete   TRANSURETHRAL RESECTION OF BLADDER TUMOR N/A 09/18/2018   Procedure: RESTAGE TRANSURETHRAL RESECTION OF BLADDER TUMOR (TURBT);  Surgeon: Lucas Mallow, MD;  Location: [facility];  Service: Urology;  Laterality: N/A;   TRANSURETHRAL RESECTION OF BLADDER TUMOR WITH MITOMYCIN-C N/A 08/16/2018   Procedure: TRANSURETHRAL RESECTION OF BLADDER TUMOR;  Surgeon: Lucas Mallow, MD;  Location: [facility];  Service: Urology;  Laterality: N/A;   VIDEO BRONCHOSCOPY WITH ENDOBRONCHIAL ULTRASOUND N/A 06/14/2021   Procedure: VIDEO BRONCHOSCOPY WITH ENDOBRONCHIAL ULTRASOUND;  Surgeon: Garner Nash, DO;  Location: [place];  Service: Pulmonary;  Laterality: N/A;   Family History  Problem Relation Age of Onset   Colon cancer Neg Hx    Colon polyps Neg Hx    Esophageal cancer Neg Hx    Rectal cancer Neg Hx    Stomach cancer Neg Hx    Pancreatic cancer Neg Hx    Breast cancer Neg Hx    Social History   Socioeconomic History   Marital status: Single    Spouse name: Not on file   Number of children: 0   Years of education: 6 years college   Highest education level: Master's degree (e.g., MA, MS, MEng, MEd, MSW, MBA)  Occupational History   Occupation: retired  Tobacco Use   Smoking status: Former    Packs/day: 1.00    Years: 50.00    Pack years: 50.00    Types: Cigarettes    Start date: 1966   Smokeless tobacco: Never   Tobacco comments:    Stopped smoking 07/10/2021  Vaping Use    Vaping Use: Never used  Substance and Sexual Activity   Alcohol use: Not Currently    Alcohol/week: 2.0 standard drinks    Types: 2 Shots of liquor per week    Comment: daily alcohol   Drug use: No   Sexual activity: Not on file  Other Topics Concern   Not on file  Social History Narrative   Not on file   Social Determinants of Health   Financial Resource Strain: Low Risk    Difficulty of Paying Living Expenses: Not very hard  Food Insecurity: Not on file  Transportation Needs: No Transportation Needs   Lack of Transportation (Medical): No   Lack of Transportation (Non-Medical): No  Physical Activity: Not on file  Stress: Not on file  Social Connections: Not on file    Clinical Intake:   Diabetic? No   Activities  of Daily Living In your present state of health, do you have any difficulty performing the following activities: 02/02/2021  Hearing? N  Vision? Y  Difficulty concentrating or making decisions? N  Walking or climbing stairs? N  Dressing or bathing? N  Some recent data might be hidden    Patient Care Team: Billie Ruddy, MD as PCP - General (Family Medicine) Richardo Priest, MD as PCP - Cardiology (Cardiology) Valrie Hart, RN as Oncology Nurse Navigator (Oncology) Viona Gilmore, [facility] as Pharmacist (Pharmacist)  Indicate any recent Medical Services you may have received from other than [facility] providers in the past year (date may be approximate).     Assessment:   This is a routine wellness examination for Tracy Davidson.  Hearing/Vision screen No results found.  Dietary issues and exercise activities discussed:     Goals Addressed   None    Depression Screen PHQ 2/9 Scores 10/26/2020 01/29/2020 10/06/2019 12/08/2015 10/30/2013  PHQ - 2 Score 0 0 0 0 0  PHQ- 9 Score 0 - - - -    Fall Risk Fall Risk  10/26/2020 01/29/2020 10/06/2019 12/08/2015 10/30/2013  Falls in the past year? 0 0 - No Yes  Number falls in past yr: 0 - 1 - 2 or more  Injury with  Fall? 0 - 0 - -  Risk for fall due to : Impaired balance/gait;Impaired mobility - History of fall(s);Medication side effect - Impaired balance/gait  Follow up Falls evaluation completed;Falls prevention discussed - Falls evaluation completed;Education provided;Falls prevention discussed - -    FALL RISK PREVENTION PERTAINING TO THE HOME:  Any stairs in or around the home? Yes If so, are there any without handrails? No  Home free of loose throw rugs in walkways, pet beds, electrical cords, etc? Yes  Adequate lighting in your home to reduce risk of falls? Yes   ASSISTIVE DEVICES UTILIZED TO PREVENT FALLS:  Life alert? No  Use of a cane, walker or w/c? Yes  Grab bars in the bathroom? No  Shower chair or bench in shower? Yes  Elevated toilet seat or a handicapped toilet? Yes   TIMED UP AND GO:  Was the test performed? Yes .  Length of time to ambulate 10 feet: 5 sec.  Gait steady with use of device.  Cognitive Function:     6CIT Screen 10/06/2019  What Year? 0 points  What month? 0 points  What time? 0 points  Count back from 20 0 points  Months in reverse 0 points  Repeat phrase 0 points  Total Score 0    Immunizations Immunization History  Administered Date(s) Administered   Fluad Quad(high Dose 65+) 07/16/2020   Influenza Split 08/24/2013   Influenza Whole 08/16/1997   Influenza, High Dose Seasonal PF 10/24/2017, 08/21/2018, 07/29/2019, 07/29/2019, 07/16/2020, 07/21/2021   Influenza-Unspecified 09/06/2014, 08/07/2015, 08/21/2018   PFIZER(Purple Top)SARS-COV-2 Vaccination 11/11/2019, 12/12/2019, 07/29/2020   Pfizer Covid-19 Vaccine Bivalent Booster 47yrs & up 07/25/2021   Pneumococcal Conjugate-13 10/30/2013   Pneumococcal Polysaccharide-23 10/16/2008   Tdap 03/14/2011    TDAP status: Due, Education has been provided regarding the importance of this vaccine. Advised may receive this vaccine at local pharmacy or Health Dept. Aware to provide a copy of the  vaccination record if obtained from local pharmacy or Health Dept. Verbalized acceptance and understanding.   Qualifies for Shingles Vaccine? Yes   Zostavax completed No   Shingrix Completed?: No.    Education has been provided regarding the importance of this vaccine.  Patient has been advised to call insurance company to determine out of pocket expense if they have not yet received this vaccine. Advised may also receive vaccine at local pharmacy or Health Dept. Verbalized acceptance and understanding.  Screening Tests Health Maintenance  Topic Date Due   Zoster Vaccines- Shingrix (1 of 2) Never done   TETANUS/TDAP  03/13/2021   MAMMOGRAM  11/30/2021   Pneumonia Vaccine 36+ Years old  Completed   INFLUENZA VACCINE  Completed   DEXA SCAN  Completed   COVID-19 Vaccine  Completed   HPV VACCINES  Aged Out    Health Maintenance  Health Maintenance Due  Topic Date Due   Zoster Vaccines- Shingrix (1 of 2) Never done   TETANUS/TDAP  03/13/2021   Additional Screening:   Vision Screening: Recommended annual ophthalmology exams for early detection of glaucoma and other disorders of the eye. Is the patient up to date with their annual eye exam?  Yes  Who is the provider or what is the name of the office in which the patient attends  Followed by Dr Warrick Parisian  Dental Screening: Recommended annual dental exams for proper oral hygiene  Community Resource Referral / Chronic Care Management:  CRR required this visit?  No   CCM required this visit?  No      Plan:     I have personally reviewed and noted the following in the patients chart:   Medical and social history Use of alcohol, tobacco or illicit drugs  Current medications and supplements including opioid prescriptions. Patient currently not taking opioids Functional ability and status Nutritional status Physical activity Advanced directives List of other physicians Hospitalizations, surgeries, and ER visits in previous 12  months Vitals Screenings to include cognitive, depression, and falls Referrals and appointments  In addition, I have reviewed and discussed with patient certain preventive protocols, quality metrics, and best practice recommendations. A written personalized care plan for preventive services as well as general preventive health recommendations were provided to patient.     Criselda Peaches, LPN   7/35/6701

## 2021-10-27 NOTE — Patient Instructions (Signed)
Ms. Twilley , Thank you for taking time to come for your Medicare Wellness Visit. I appreciate your ongoing commitment to your health goals. Please review the following plan we discussed and let me know if I can assist you in the future.   These are the goals we discussed:  Goals      Exercise 3x per week (30 min per time)     Have 3 meals a day     Try to eat 3 meals per day; even if they are small.      Patient Stated     I would like to stop hurting!      Track and Manage My Blood Pressure-Hypertension     Timeframe:  Long-Range Goal Priority:  Medium Start Date:                             Expected End Date:                       Follow Up Date 11/17/20    - check blood pressure weekly - choose a place to take my blood pressure (home, clinic or office, retail store) - write blood pressure results in a log or diary    Why is this important?   You won't feel high blood pressure, but it can still hurt your blood vessels.  High blood pressure can cause heart or kidney problems. It can also cause a stroke.  Making lifestyle changes like losing a little weight or eating less salt will help.  Checking your blood pressure at home and at different times of the day can help to control blood pressure.  If the doctor prescribes medicine remember to take it the way the doctor ordered.  Call the office if you cannot afford the medicine or if there are questions about it.     Notes:         This is a list of the screening recommended for you and due dates:  Health Maintenance  Topic Date Due   Zoster (Shingles) Vaccine (1 of 2) Never done   Tetanus Vaccine  03/13/2021   Mammogram  11/30/2021   Pneumonia Vaccine  Completed   Flu Shot  Completed   DEXA scan (bone density measurement)  Completed   COVID-19 Vaccine  Completed   HPV Vaccine  Aged Out   Advanced directives: No  Conditions/risks identified: None  Next appointment: Follow up in one year for your annual wellness  visit   Preventive Care 65 Years and Older, Female Preventive care refers to lifestyle choices and visits with your health care provider that can promote health and wellness. What does preventive care include? A yearly physical exam. This is also called an annual well check. Dental exams once or twice a year. Routine eye exams. Ask your health care provider how often you should have your eyes checked. Personal lifestyle choices, including: Daily care of your teeth and gums. Regular physical activity. Eating a healthy diet. Avoiding tobacco and drug use. Limiting alcohol use. Practicing safe sex. Taking low-dose aspirin every day. Taking vitamin and mineral supplements as recommended by your health care provider. What happens during an annual well check? The services and screenings done by your health care provider during your annual well check will depend on your age, overall health, lifestyle risk factors, and family history of disease. Counseling  Your health care provider may ask you questions  about your: Alcohol use. Tobacco use. Drug use. Emotional well-being. Home and relationship well-being. Sexual activity. Eating habits. History of falls. Memory and ability to understand (cognition). Work and work Statistician. Reproductive health. Screening  You may have the following tests or measurements: Height, weight, and BMI. Blood pressure. Lipid and cholesterol levels. These may be checked every 5 years, or more frequently if you are over 47 years old. Skin check. Lung cancer screening. You may have this screening every year starting at age 43 if you have a 30-pack-year history of smoking and currently smoke or have quit within the past 15 years. Fecal occult blood test (FOBT) of the stool. You may have this test every year starting at age 34. Flexible sigmoidoscopy or colonoscopy. You may have a sigmoidoscopy every 5 years or a colonoscopy every 10 years starting at age  92. Hepatitis C blood test. Hepatitis B blood test. Sexually transmitted disease (STD) testing. Diabetes screening. This is done by checking your blood sugar (glucose) after you have not eaten for a while (fasting). You may have this done every 1-3 years. Bone density scan. This is done to screen for osteoporosis. You may have this done starting at age 70. Mammogram. This may be done every 1-2 years. Talk to your health care provider about how often you should have regular mammograms. Talk with your health care provider about your test results, treatment options, and if necessary, the need for more tests. Vaccines  Your health care provider may recommend certain vaccines, such as: Influenza vaccine. This is recommended every year. Tetanus, diphtheria, and acellular pertussis (Tdap, Td) vaccine. You may need a Td booster every 10 years. Zoster vaccine. You may need this after age 64. Pneumococcal 13-valent conjugate (PCV13) vaccine. One dose is recommended after age 74. Pneumococcal polysaccharide (PPSV23) vaccine. One dose is recommended after age 23. Talk to your health care provider about which screenings and vaccines you need and how often you need them. This information is not intended to replace advice given to you by your health care provider. Make sure you discuss any questions you have with your health care provider. Document Released: 10/29/2015 Document Revised: 06/21/2016 Document Reviewed: 08/03/2015 Elsevier Interactive Patient Education  2017 [employer] Prevention in the Home Falls can cause injuries. They can happen to people of all ages. There are many things you can do to make your home safe and to help prevent falls. What can I do on the outside of my home? Regularly fix the edges of walkways and driveways and fix any cracks. Remove anything that might make you trip as you walk through a door, such as a raised step or threshold. Trim any bushes or trees on the  path to your home. Use bright outdoor lighting. Clear any walking paths of anything that might make someone trip, such as rocks or tools. Regularly check to see if handrails are loose or broken. Make sure that both sides of any steps have handrails. Any raised decks and porches should have guardrails on the edges. Have any leaves, snow, or ice cleared regularly. Use sand or salt on walking paths during winter. Clean up any spills in your garage right away. This includes oil or grease spills. What can I do in the bathroom? Use night lights. Install grab bars by the toilet and in the tub and shower. Do not use towel bars as grab bars. Use non-skid mats or decals in the tub or shower. If you need to sit down  in the shower, use a plastic, non-slip stool. Keep the floor dry. Clean up any water that spills on the floor as soon as it happens. Remove soap buildup in the tub or shower regularly. Attach bath mats securely with double-sided non-slip rug tape. Do not have throw rugs and other things on the floor that can make you trip. What can I do in the bedroom? Use night lights. Make sure that you have a light by your bed that is easy to reach. Do not use any sheets or blankets that are too big for your bed. They should not hang down onto the floor. Have a firm chair that has side arms. You can use this for support while you get dressed. Do not have throw rugs and other things on the floor that can make you trip. What can I do in the kitchen? Clean up any spills right away. Avoid walking on wet floors. Keep items that you use a lot in easy-to-reach places. If you need to reach something above you, use a strong step stool that has a grab bar. Keep electrical cords out of the way. Do not use floor polish or wax that makes floors slippery. If you must use wax, use non-skid floor wax. Do not have throw rugs and other things on the floor that can make you trip. What can I do with my stairs? Do not  leave any items on the stairs. Make sure that there are handrails on both sides of the stairs and use them. Fix handrails that are broken or loose. Make sure that handrails are as long as the stairways. Check any carpeting to make sure that it is firmly attached to the stairs. Fix any carpet that is loose or worn. Avoid having throw rugs at the top or bottom of the stairs. If you do have throw rugs, attach them to the floor with carpet tape. Make sure that you have a light switch at the top of the stairs and the bottom of the stairs. If you do not have them, ask someone to add them for you. What else can I do to help prevent falls? Wear shoes that: Do not have high heels. Have rubber bottoms. Are comfortable and fit you well. Are closed at the toe. Do not wear sandals. If you use a stepladder: Make sure that it is fully opened. Do not climb a closed stepladder. Make sure that both sides of the stepladder are locked into place. Ask someone to hold it for you, if possible. Clearly mark and make sure that you can see: Any grab bars or handrails. First and last steps. Where the edge of each step is. Use tools that help you move around (mobility aids) if they are needed. These include: Canes. Walkers. Scooters. Crutches. Turn on the lights when you go into a dark area. Replace any light bulbs as soon as they burn out. Set up your furniture so you have a clear path. Avoid moving your furniture around. If any of your floors are uneven, fix them. If there are any pets around you, be aware of where they are. Review your medicines with your doctor. Some medicines can make you feel dizzy. This can increase your chance of falling. Ask your doctor what other things that you can do to help prevent falls. This information is not intended to replace advice given to you by your health care provider. Make sure you discuss any questions you have with your health care provider. Document Released:  07/29/2009 Document Revised: 03/09/2016 Document Reviewed: 11/06/2014 Elsevier Interactive Patient Education  2017 [employer].

## 2021-11-03 DIAGNOSIS — L989 Disorder of the skin and subcutaneous tissue, unspecified: Secondary | ICD-10-CM | POA: Diagnosis not present

## 2021-11-03 DIAGNOSIS — L2084 Intrinsic (allergic) eczema: Secondary | ICD-10-CM | POA: Diagnosis not present

## 2021-11-03 DIAGNOSIS — L1081 Paraneoplastic pemphigus: Secondary | ICD-10-CM | POA: Diagnosis not present

## 2021-11-03 DIAGNOSIS — C8400 Mycosis fungoides, unspecified site: Secondary | ICD-10-CM | POA: Diagnosis not present

## 2021-11-03 DIAGNOSIS — B86 Scabies: Secondary | ICD-10-CM | POA: Diagnosis not present

## 2021-11-04 ENCOUNTER — Telehealth: Payer: Self-pay | Admitting: Pharmacist

## 2021-11-04 ENCOUNTER — Other Ambulatory Visit: Payer: Self-pay | Admitting: Family Medicine

## 2021-11-04 DIAGNOSIS — K219 Gastro-esophageal reflux disease without esophagitis: Secondary | ICD-10-CM

## 2021-11-04 NOTE — Chronic Care Management (AMB) (Signed)
Chronic Care Management Pharmacy Assistant   Name: Tracy Davidson  MRN: [id number] DOB: 09-Dec-1940  Reason for Encounter: Disease State / Hypertension Assessment Call   Conditions to be addressed/monitored: HTN   Recent office visits:  None  Recent consult visits:  None  Hospital visits:  None  Medications: Outpatient Encounter Medications as of 11/04/2021  Medication Sig   atorvastatin (LIPITOR) 10 MG tablet Take 1 tablet (10 mg total) by mouth daily.   calcium carbonate (OSCAL) 1500 (600 Ca) MG TABS tablet Take 1,500 mg by mouth 2 (two) times daily with a meal.   Cholecalciferol (VITAMIN D) 50 MCG (2000 UT) CAPS Take 5,000 Units by mouth daily.    ELIQUIS 5 MG TABS tablet TAKE 1 TABLET BY MOUTH TWICE A DAY   KLOR-CON M20 20 MEQ tablet TAKE 1 TABLET BY MOUTH EVERY DAY   lisinopril-hydrochlorothiazide (ZESTORETIC) 20-25 MG tablet TAKE 1 TABLET BY MOUTH EVERY DAY   Melatonin 5 MG CAPS Take 5 mg by mouth at bedtime as needed (sleep).   nicotine (NICODERM CQ - DOSED IN MG/24 HOURS) 14 mg/24hr patch Place 14 mg onto the skin daily. (Patient not taking: Reported on 10/24/2021)   nicotine polacrilex (COMMIT) 2 MG lozenge Take 2 mg by mouth as needed for smoking cessation.   omeprazole (PRILOSEC) 20 MG capsule TAKE 1 CAPSULE BY MOUTH EVERY DAY   No facility-administered encounter medications on file as of 11/04/2021.  Fill History:  ELIQUIS 5 MG TABLET 10/10/2021 30   ATORVASTATIN 10 MG TABLET 09/16/2021 90   LISINOPRIL/HYDROCHLOROTHIAZIDE  20-25 MG TABS 10/19/2021 90   OMEPRAZOLE DR 20 MG CAPSULE 08/07/2021 90   KLOR-CON M20 TABLET 10/04/2021 90   Reviewed chart prior to disease state call. Spoke with patient regarding BP  Recent Office Vitals: BP Readings from Last 3 Encounters:  10/27/21 110/62  10/24/21 139/72  08/15/21 (!) 153/68   Pulse Readings from Last 3 Encounters:  10/24/21 79  08/15/21 78  07/26/21 94    Wt Readings from Last 3 Encounters:  10/27/21  120 lb 3.2 oz (54.5 kg)  10/24/21 132 lb 6.4 oz (60.1 kg)  08/15/21 125 lb 6 oz (56.9 kg)     Kidney Function Lab Results  Component Value Date/Time   CREATININE 0.78 07/21/2021 10:18 AM   CREATININE 0.78 06/21/2021 09:52 AM   GFR 75.03 05/12/2021 11:19 AM   GFRNONAA >60 07/21/2021 10:18 AM   GFRAA >60 11/24/2019 05:40 PM    BMP Latest Ref Rng & Units 07/21/2021 06/21/2021 05/31/2021  Glucose 70 - 99 mg/dL 93 95 105(H)  BUN 8 - 23 mg/dL 15 11 10   Creatinine 0.44 - 1.00 mg/dL 0.78 0.78 0.78  Sodium 135 - 145 mmol/L 141 143 142  Potassium 3.5 - 5.1 mmol/L 4.0 3.9 4.0  Chloride 98 - 111 mmol/L 101 103 103  CO2 22 - 32 mmol/L 30 28 30   Calcium 8.9 - 10.3 mg/dL 10.2 10.4(H) 10.4(H)    Current antihypertensive regimen:  Zestoretic 20/25 mg daily  How often are you checking your Blood Pressure? Patient is not checking blood pressures at home, she forgot she was to get a monitor and start checking.  She will try to remember to get one.   Current home BP readings: Patient is not checking at home   What recent interventions/DTPs have been made by any provider to improve Blood Pressure control since last CPP Visit: No recent interventions  Any recent hospitalizations or ED visits since last visit  with CPP? No recent hospital visits.   What diet changes have been made to improve Blood Pressure Control?  Patient is not following any type of diet. Breakfast - patient states she does not eat breakfast Lunch - patient will have a variety like a salad, sandwich, burger, tuna or eat out.  Dinner - patient will have a meat and vegetable, she eats primarily chicken   What exercise is being done to improve your Blood Pressure Control?  Patient states she is limited due to her legs bothering her, she does walk around her house daily when she needs to .   Adherence Review: Is the patient currently on ACE/ARB medication? Yes Does the patient have >5 day gap between last estimated fill dates?  No  Care Gaps: AWV - scheduled for 10/27/2021 Shingrix - never done Last BP - 156/71 on 07/26/2021 Last A1C - 4.5 on 01/29/2020  Star Rating Drugs: Atorvastatin 10 mg - last [facility] Lisinopril HCTZ 20/25 mg - last filled 10/19/2021 90 DS at [facility] Pharmacist Assistant [phone]

## 2021-11-23 DIAGNOSIS — L578 Other skin changes due to chronic exposure to nonionizing radiation: Secondary | ICD-10-CM | POA: Diagnosis not present

## 2021-11-23 DIAGNOSIS — L2089 Other atopic dermatitis: Secondary | ICD-10-CM | POA: Diagnosis not present

## 2021-12-06 ENCOUNTER — Other Ambulatory Visit: Payer: Self-pay | Admitting: Family Medicine

## 2021-12-06 DIAGNOSIS — Z9889 Other specified postprocedural states: Secondary | ICD-10-CM

## 2021-12-07 DIAGNOSIS — L2089 Other atopic dermatitis: Secondary | ICD-10-CM | POA: Diagnosis not present

## 2021-12-14 ENCOUNTER — Telehealth: Payer: Self-pay | Admitting: Pharmacist

## 2021-12-14 NOTE — Chronic Care Management (AMB) (Signed)
? ? ?  Chronic Care Management ?Pharmacy Assistant  ? ?Name: [name]  MRN: [id number] DOB: 1940-10-23 ? ?12/16/2021 APPOINTMENT REMINDER ? ? ?[place], No answer, left message of appointment on 12/16/2021 at 1:00 via telephone visit with Jeni Salles, Pharm D. Notified to have all medications, supplements, blood pressure and/or blood sugar logs available during appointment and to return call if need to reschedule. ? ? ?Care Gaps: ?AWV - scheduled for 10/27/2021 ?Last BP - 156/71 on 07/26/2021 ?Last A1C - 4.5 on 01/29/2020 ?Mamogram - overdue ? ?Star Rating Drug: ?Atorvastatin 10 mg - last [facility] ?Lisinopril HCTZ 20/25 mg - [facility] ? ?Any gaps in medications fill history? No ? ?Gennie Alma CMA  ?Clinical Pharmacist Assistant ?[phone] ? ?

## 2021-12-14 NOTE — Chronic Care Management (AMB) (Signed)
Error

## 2021-12-16 ENCOUNTER — Ambulatory Visit (INDEPENDENT_AMBULATORY_CARE_PROVIDER_SITE_OTHER): Payer: Medicare Other | Admitting: Pharmacist

## 2021-12-16 DIAGNOSIS — I10 Essential (primary) hypertension: Secondary | ICD-10-CM

## 2021-12-16 DIAGNOSIS — I82532 Chronic embolism and thrombosis of left popliteal vein: Secondary | ICD-10-CM

## 2021-12-16 NOTE — Progress Notes (Signed)
Chronic Care Management Pharmacy Note  12/16/2021 Name:  Tracy Davidson MRN:  [id number] DOB:  03-Jan-1941  Summary: Pt is overdue for CPE LDL is not at goal < 70  Recommendations/Changes made from today's visit: -Recommend repeat vitamin D level and DEXA -Recommended bringing BP cuff to next office visit to go through proper use -Recommend repeat lipid panel and if LDL is still > 70, increase to high intensity statin  Plan: Follow up BP assessment in 3 months Scheduled follow up with PCP for right leg pain and CPE PAP for Eliquis at follow up   Subjective: Tracy Davidson is an 81 y.o. year old female who is a primary patient of Billie Ruddy, MD.  The CCM team was consulted for assistance with disease management and care coordination needs.    Engaged with patient by telephone for follow up visit in response to provider referral for pharmacy case management and/or care coordination services.   Consent to Services:  The patient was given information about Chronic Care Management services, agreed to services, and gave verbal consent prior to initiation of services.  Please see initial visit note for detailed documentation.   Patient Care Team: Billie Ruddy, MD as PCP - General (Family Medicine) Bettina Gavia Hilton Cork, MD as PCP - Cardiology (Cardiology) Valrie Hart, RN as Oncology Nurse Navigator (Oncology) Viona Gilmore, [facility] as Pharmacist (Pharmacist)  Recent office visits: 10/27/21 Rolene Arbour, LPN: Patient presented for AWV.  06/03/2021 [name] MD - Patient was seen for Right knee pain and additional issues. Changed Apixaban 5 mg to take two times daily. Discontinued Aspirin. No follow up noted.    Recent consult visits: 10/04/21 Rutherford Guys, MD: Patient presented for eye exam.   10/05/21 Cancer center: Patient presented for treatment.  08/15/21 Cancer center nurse evaluation.  07/26/2021 Modesto Charon MD (Cardiothoracic surgery) - Patient was  seen for non small cell lung cancer, right.  No medication changes. No follow up noted.   07/21/2021 Curt Bears MD (oncology) - Patient was seen for non small cell lung cancer, right. No medication changes. Follow up 6 months.  06/22/21 Patient presented for PFTs at pulmonary office.   06/22/2021 [name] DO (Pulmonary Disease) - Patient was seen for non small cell lung cancer, right and additional issues. No medication changes. Follow up in 4-6 months.   06/21/2021 Cassandra Heilingoetter PA-C(oncology) - Patient was seen for non small cell lung cancer, right and additional issues. Discontinued Furosemide 20 mg. Follow up in 1 month.   06/08/2021 [name] DO (Pulmonary Disease) - Patient was seen for non small cell lung cancer, right and additional issues. Discontinued Capsaicin 0.075% and Triamcinolone Acetonide 0.5%. Follow up in 2 weeks.   05/31/2021 Curt Bears MD (oncology) - Patient was seen for Malignant neoplasm of unspecified part of unspecified bronchus or lung and an additional issue. No medication changes.  Follow up in 2 weeks.    03/15/2021 [name] MD (Vascular surgery) - Patient was seen for PAD. No medication changes. Follow up in 1 year.  Hospital visits: None in previous 6 months   Objective:  Lab Results  Component Value Date   CREATININE 0.78 07/21/2021   BUN 15 07/21/2021   GFR 75.03 05/12/2021   GFRNONAA >60 07/21/2021   GFRAA >60 11/24/2019   NA 141 07/21/2021   K 4.0 07/21/2021   CALCIUM 10.2 07/21/2021   CO2 30 07/21/2021   GLUCOSE 93 07/21/2021    Lab Results  Component Value Date/Time   HGBA1C 4.5 (L) 01/29/2020 11:18 AM   GFR 75.03 05/12/2021 11:19 AM   GFR 104.38 03/11/2020 02:42 PM    Last diabetic Eye exam: No results found for: HMDIABEYEEXA  Last diabetic Foot exam: No results found for: HMDIABFOOTEX   Lab Results  Component Value Date   CHOL 199 01/29/2020   HDL 98.90 01/29/2020   LDLCALC 88 01/29/2020    LDLDIRECT 143.2 03/14/2011   TRIG 60.0 01/29/2020   CHOLHDL 2 01/29/2020    Hepatic Function Latest Ref Rng & Units 07/21/2021 06/21/2021 05/31/2021  Total Protein 6.5 - 8.1 g/dL 7.9 8.0 8.3(H)  Albumin 3.5 - 5.0 g/dL 4.0 4.0 4.3  AST 15 - 41 U/L '23 22 25  ' ALT 0 - 44 U/L '16 15 17  ' Alk Phosphatase 38 - 126 U/L 72 68 71  Total Bilirubin 0.3 - 1.2 mg/dL 1.1 0.8 0.9  Bilirubin, Direct 0.0 - 0.3 mg/dL - - -    Lab Results  Component Value Date/Time   TSH 2.35 01/29/2020 11:18 AM   TSH 1.89 10/24/2017 12:14 PM   FREET4 1.00 01/29/2020 11:18 AM    CBC Latest Ref Rng & Units 07/21/2021 06/21/2021 05/31/2021  WBC 4.0 - 10.5 K/uL 10.7(H) 8.5 9.0  Hemoglobin 12.0 - 15.0 g/dL 11.5(L) 12.2 13.0  Hematocrit 36.0 - 46.0 % 35.9(L) 37.5 39.0  Platelets 150 - 400 K/uL 190 188 180    Lab Results  Component Value Date/Time   VD25OH >120 03/11/2020 02:42 PM    Clinical ASCVD: Yes  The ASCVD Risk score (Arnett DK, et al., 2019) failed to calculate for the following reasons:   The 2019 ASCVD risk score is only valid for ages 51 to 14    Depression screen PHQ 2/9 10/27/2021 10/26/2020 01/29/2020  Decreased Interest 0 0 0  Down, Depressed, Hopeless 0 0 0  PHQ - 2 Score 0 0 0  Altered sleeping - 0 -  Tired, decreased energy - 0 -  Change in appetite - 0 -  Feeling bad or failure about yourself  - 0 -  Trouble concentrating - 0 -  Moving slowly or fidgety/restless - 0 -  Suicidal thoughts - 0 -  PHQ-9 Score - 0 -  Difficult doing work/chores - Not difficult at all -  Some recent data might be hidden      Social History   Tobacco Use  Smoking Status Former   Packs/day: 1.00   Years: 50.00   Pack years: 50.00   Types: Cigarettes   Start date: 1966  Smokeless Tobacco Never  Tobacco Comments   Stopped smoking 07/10/2021   BP Readings from Last 3 Encounters:  10/27/21 110/62  10/24/21 139/72  08/15/21 (!) 153/68   Pulse Readings from Last 3 Encounters:  10/24/21 79  08/15/21 78   07/26/21 94   Wt Readings from Last 3 Encounters:  10/27/21 120 lb 3.2 oz (54.5 kg)  10/24/21 132 lb 6.4 oz (60.1 kg)  08/15/21 125 lb 6 oz (56.9 kg)   BMI Readings from Last 3 Encounters:  10/27/21 21.29 kg/m  10/24/21 23.09 kg/m  08/15/21 22.21 kg/m    Assessment/Interventions: Review of patient past medical history, allergies, medications, health status, including review of consultants reports, laboratory and other test data, was performed as part of comprehensive evaluation and provision of chronic care management services.   SDOH:  (Social Determinants of Health) assessments and interventions performed: Yes   SDOH Screenings   Alcohol Screen: Not  on file  Depression (PHQ2-9): Low Risk    PHQ-2 Score: 0  Financial Resource Strain: Low Risk    Difficulty of Paying Living Expenses: Not hard at all  Food Insecurity: No Food Insecurity   Worried About Charity fundraiser in the Last Year: Never true   Ran Out of Food in the Last Year: Never true  Housing: Low Risk    Last Housing Risk Score: 0  Physical Activity: Inactive   Days of Exercise per Week: 0 days   Minutes of Exercise per Session: 0 min  Social Connections: Moderately Integrated   Frequency of Communication with Friends and Family: More than three times a week   Frequency of Social Gatherings with Friends and Family: More than three times a week   Attends Religious Services: More than 4 times per year   Active Member of [employer] or Organizations: Yes   Attends Music therapist: More than 4 times per year   Marital Status: Divorced  Stress: No Stress Concern Present   Feeling of Stress : Not at all  Tobacco Use: Medium Risk   Smoking Tobacco Use: Former   Smokeless Tobacco Use: Never   Passive Exposure: Not on Pensions consultant Needs: No Transportation Needs   Lack of Transportation (Medical): No   Lack of Transportation (Non-Medical): No   CCM Care Plan  Allergies  Allergen Reactions    Doxycycline Palpitations    Medications Reviewed Today     Reviewed by Viona Gilmore, [facility] (Pharmacist) on 12/16/21 at [place] List Status: <None>   Medication Order Taking? Sig Documenting Provider Last Dose Status Informant  atorvastatin (LIPITOR) 10 MG tablet [id number]  Take 1 tablet (10 mg total) by mouth daily. Marty Heck, MD  Active Self  calcium carbonate (OSCAL) 1500 (600 Ca) MG TABS tablet [id number]  Take 1,500 mg by mouth 2 (two) times daily with a meal. [provider]  Active Self  Cholecalciferol (VITAMIN D) 50 MCG (2000 UT) CAPS [id number]  Take 5,000 Units by mouth daily.  [provider]  Active Self  ELIQUIS 5 MG TABS tablet [id number]  TAKE 1 TABLET BY MOUTH TWICE A DAY Billie Ruddy, MD  Active   KLOR-CON M20 20 MEQ tablet [id number]  TAKE 1 TABLET BY MOUTH EVERY DAY Billie Ruddy, MD  Active Self  lisinopril-hydrochlorothiazide (ZESTORETIC) 20-25 MG tablet [id number]  TAKE 1 TABLET BY MOUTH EVERY DAY Billie Ruddy, MD  Active   Melatonin 5 MG CAPS [id number]  Take 5 mg by mouth at bedtime as needed (sleep). [provider]  Active Self  nicotine polacrilex (COMMIT) 2 MG lozenge [id number]  Take 2 mg by mouth as needed for smoking cessation. [provider]  Active   omeprazole (PRILOSEC) 20 MG capsule [id number]  TAKE 1 CAPSULE BY MOUTH EVERY DAY Billie Ruddy, MD  Active   triamcinolone ointment (KENALOG) 0.1 % [id number] Yes Apply 1 application topically 2 (two) times daily. [provider] Taking Active             Patient Active Problem List   Diagnosis Date Noted   Non-small cell lung cancer, right ([place]) 06/21/2021   Endobronchial cancer, right ([place])    Aortic atherosclerosis ([place]) 06/09/2021   Chronic deep vein thrombosis (DVT) of popliteal vein of left lower extremity ([place]) 06/09/2021   Right middle lobe pulmonary nodule 05/31/2021   Polycythemia 01/30/2020   Sinus tachycardia 01/30/2020    SOB (shortness  of breath) on exertion 01/30/2020   Hypercalcemia 01/30/2020   PAD (peripheral artery disease) ([place]) 07/29/2019   Tobacco abuse 03/14/2012   LIVER FUNCTION TESTS, ABNORMAL, HX OF 09/21/2009   Dyslipidemia 09/20/2009   ALLERGIC RHINITIS 09/20/2009   Essential hypertension 06/06/2007   COLONIC POLYPS, HX OF 06/06/2007    Immunization History  Administered Date(s) Administered   Fluad Quad(high Dose 65+) 07/16/2020   Influenza Split 08/24/2013   Influenza Whole 08/16/1997   Influenza, High Dose Seasonal PF 10/24/2017, 08/21/2018, 07/29/2019, 07/29/2019, 07/16/2020, 07/21/2021   Influenza-Unspecified 09/06/2014, 08/07/2015, 08/21/2018   PFIZER(Purple Top)SARS-COV-2 Vaccination 11/11/2019, 12/12/2019, 07/29/2020   Pfizer Covid-19 Vaccine Bivalent Booster 45yr & up 07/25/2021   Pneumococcal Conjugate-13 10/30/2013   Pneumococcal Polysaccharide-23 10/16/2008   Tdap 03/14/2011    Conditions to be addressed/monitored:  Hypertension, Hyperlipidemia, GERD, Tobacco use, Allergic Rhinitis, and PAD  Conditions addressed this visit: Hypertension, tobacco use  Care Plan : [place] Updates made by PViona Gilmore RGreensince 12/16/2021 12:00 AM     Problem: Problem:Hypertension, Hyperlipidemia, GERD, Tobacco use, Allergic Rhinitis, and PAD      Long-Range Goal: Patient-Specific Goal   Start Date: 08/17/2021  Expected End Date: 08/17/2022  Recent Progress: On track  Priority: High  Note:   Current Barriers:  Unable to independently monitor therapeutic efficacy Unable to achieve control of cholesterol  Unable to maintain control of blood pressure  Pharmacist Clinical Goal(s):  Patient will achieve adherence to monitoring guidelines and medication adherence to achieve therapeutic efficacy achieve control of cholesterol as evidenced by next lipid panel maintain control of blood pressure as evidenced by blood pressure readings  through collaboration with  PharmD and provider.   Interventions: 1:1 collaboration with BBillie Ruddy MD regarding development and update of comprehensive plan of care as evidenced by provider attestation and co-signature Inter-disciplinary care team collaboration (see longitudinal plan of care) Comprehensive medication review performed; medication list updated in electronic medical record  Hypertension (BP goal <140/90) -Not ideally controlled -Current treatment: Lisinopril-HCTZ 20-25 mg 1 tablet daily - Appropriate, Query effective, Safe, Accessible -Medications previously tried: unknown  -Current home readings: does not check at home and does not own a cuff -Current dietary habits: does use a little; does eat out some (at least once a week); looks package labels  -Current exercise habits: does not do exercise  -Denies hypotensive/hypertensive symptoms -Educated on BP goals and benefits of medications for prevention of heart attack, stroke and kidney damage; Exercise goal of 150 minutes per week; Importance of home blood pressure monitoring; Proper BP monitoring technique; -Counseled to monitor BP at home weekly, document, and provide log at future appointments -Counseled on diet and exercise extensively Recommended to continue current medication Recommended purchasing a BP cuff to check at home.   Hyperlipidemia: (LDL goal < 70) -Uncontrolled -Current treatment: Atorvastatin 10 mg 1 tablet daily - Appropriate, Query effective, Safe, Accessible -Medications previously tried: none  -Current dietary patterns:  very little fried foods; uses margarine with vegetables -Current exercise habits: does not exercise -Educated on Cholesterol goals;  Benefits of statin for ASCVD risk reduction; Importance of limiting foods high in cholesterol; -Counseled on diet and exercise extensively Recommended repeat lipid panel and consider dose escalation if LDL > 70.  PAD (Goal: prevent heart events) -Not ideally  controlled -Current treatment  Eliquis 5 mg 1 tablet twice daily - Appropriate, Effective, Safe, Accessible Atorvastatin 10 mg 1 tablet daily - Appropriate, Query effective, Safe, Accessible -Medications previously tried: none  -  Counseled on importance of separating doses by 12 hours for Eliquis. Recommended increasing statin to high intensity.  Tobacco use (Goal continue to abstain from smoking) -Controlled -Previous quit attempts: patches (finish) -Current treatment  Nicotine lozenge 2 mg as needed -Patient smokes After 30 minutes of waking -Patient triggers include:  enjoyment -On a scale of 1-10, reports MOTIVATION to quit is 10 -On a scale of 1-10, reports CONFIDENCE in quitting is 10 -Counseled on patch placement, side effects, and option to remove at night if they experience trouble sleeping or bad dreams.  Provided contact information for [place] Quit Line (1-800-QUIT-NOW) and encouraged patient to reach out to this group for support. -Recommended to continue current medication  DVT (Goal: prevent blood clots) -Controlled -Current treatment  Eliquis 5 mg 1 tablet twice daily - Appropriate, Effective, Safe, Query accessible -Medications previously tried: none  -Educated on signs and symptoms of a blood clot. Assessed patient finances. Plan to apply for PAP at follow up.  GERD (Goal: minimize symptoms of heartburn) -Controlled -Current treatment  Omeprazole 20 mg 1 capsule daily - Appropriate, Effective, Safe, Accessible -Medications previously tried: none  -Counseled on non-pharmacologic management of symptoms such as elevating the head of your bed, avoiding eating 2-3 hours before bed, avoiding triggering foods such as acidic, spicy, or fatty foods, eating smaller meals, and wearing clothes that are loose around the waist Discussed the long term risks of taking PPIs and consider taper.   Osteoporosis (Goal prevent fractures) -Controlled -Last DEXA Scan: 05/07/2019   T-Score  femoral neck: -2.2  T-Score total hip: n/a  T-Score lumbar spine: 1.3  T-Score forearm radius: n/a  10-year probability of major osteoporotic fracture: 9.0%4  10-year probability of hip fracture: 4.7% -Patient is a candidate for pharmacologic treatment due to T-Score -1.0 to -2.5 and 10-year risk of hip fracture > 3% -Current treatment  Calcium carbonate & vitamin D 600 mg twice daily (total 1200 mg and vitamin D 1000 units) - Appropriate, Effective, Safe, Accessible Vitamin D 2000 units daily - Appropriate, Effective, Safe, Accessible -Medications previously tried: none  -Recommend [phone] units of vitamin D daily. Recommend 1200 mg of calcium daily from dietary and supplemental sources. -Recommended repeat vitamin D level. Consider repeat DEXA.  Health Maintenance -Vaccine gaps: shingrix, tetanus, COVID vaccine -Current therapy:  Melatonin 5 mg 1 capsule at bedtime  Potassium chloride 20 mEq daily -Educated on Cost vs benefit of each product must be carefully weighed by individual consumer -Patient is satisfied with current therapy and denies issues -Recommended timed release melatonin and avoiding screen time before bed.  Patient Goals/Self-Care Activities Patient will:  - take medications as prescribed as evidenced by patient report and record review check blood pressure weekly, document, and provide at future appointments  Follow Up Plan: Telephone follow up appointment with care management team member scheduled for: 6 months      Medication Assistance: None required.  Patient affirms current coverage meets needs.  Compliance/Adherence/Medication fill history: Care Gaps: Shingrix, tetanus Last BP - 110/62 on 10/27/21  Star-Rating Drugs: Atorvastatin 10 mg - [facility] Lisinopril HCTZ 20/25 mg - last filled [facility]21 [facility]  [facility]  [id number]: [phone]: [phone]  Uses pill box? No - only if she is going somewhere Pt endorses 90% compliance - can skip the second calcium on the days  We discussed: Benefits of medication synchronization, packaging and delivery as well as enhanced pharmacist oversight with [employer]. Patient decided to: Continue current medication management strategy  Care Plan and Follow Up Patient Decision:  Patient agrees to Care Plan and Follow-up.  Plan: Telephone follow up appointment with care management team member scheduled for:  6 months  Jeni Salles, PharmD, [place] at [place] [phone]

## 2021-12-19 ENCOUNTER — Other Ambulatory Visit: Payer: Self-pay | Admitting: Family Medicine

## 2021-12-19 DIAGNOSIS — I82532 Chronic embolism and thrombosis of left popliteal vein: Secondary | ICD-10-CM

## 2021-12-21 DIAGNOSIS — L2089 Other atopic dermatitis: Secondary | ICD-10-CM | POA: Diagnosis not present

## 2021-12-28 ENCOUNTER — Ambulatory Visit: Payer: Medicare Other | Admitting: Pulmonary Disease

## 2021-12-28 ENCOUNTER — Encounter: Payer: Self-pay | Admitting: Pulmonary Disease

## 2021-12-28 ENCOUNTER — Other Ambulatory Visit: Payer: Self-pay

## 2021-12-28 VITALS — BP 126/58 | HR 96 | Temp 98.0°F | Ht 63.0 in | Wt 133.6 lb

## 2021-12-28 DIAGNOSIS — R911 Solitary pulmonary nodule: Secondary | ICD-10-CM | POA: Diagnosis not present

## 2021-12-28 DIAGNOSIS — C3491 Malignant neoplasm of unspecified part of right bronchus or lung: Secondary | ICD-10-CM | POA: Diagnosis not present

## 2021-12-28 DIAGNOSIS — Z720 Tobacco use: Secondary | ICD-10-CM

## 2021-12-28 DIAGNOSIS — Z87891 Personal history of nicotine dependence: Secondary | ICD-10-CM | POA: Diagnosis not present

## 2021-12-28 NOTE — Patient Instructions (Signed)
Thank you for visiting Dr. Valeta Harms at [facility]. ?Today we recommend the following: ? ?Return in about 1 year (around 12/29/2022), or if symptoms worsen or fail to improve, for with Eric Form, NP, or Dr. Valeta Harms. ? ? ? ?Please do your part to reduce the spread of COVID-19.  ?

## 2021-12-28 NOTE — Progress Notes (Signed)
? ?Synopsis: Referred in August 2022 for abnormal CT imaging, by Dr. Earlie Server, PCP: By Billie Ruddy, MD ? ?Subjective:  ? ?PATIENT ID: Tracy Davidson GENDER: female DOB: 08/31/1941, MRN: [id number] ? ?Chief Complaint  ?Patient presents with  ? Follow-up  ?  Follow up. Patient has no complaints.   ? ? ?This is an 81 year old female, past medical history of hypertension, GERD, dyslipidemia.College he after having abnormal CT imaging.  Patient was referred to medical CT imaging of the chest was completed on 05/18/2021.  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  Patient was referred here for consideration of bronchoscopy and tissue sampling.  Patient has a PET scan scheduled for 06/15/2021.  Office note from Dr. Earlie Server on 05/31/2021 reviewed.  She is a current smoker 1 pack/day for greater than 60 years.  Patient has no respiratory complaints today.  Patient denies hemoptysis.  Of note was recently diagnosed with an age indeterminant lower extremity clot and started on Eliquis. ? ?OV 06/22/2021: Here today for follow-up after recent bronchoscopy.Patient had a right middle lobe obstructing lesion.  This was debulked and removed back to a base located within the right middle lobe subsegment.  Patient's pathology was consistent with non-small cell carcinoma favor adenocarcinoma immunohistochemistry profile.  She has subsequently followed up with medical oncology.  Office note 06/21/2021 reviewed today.  Patient has a stage IIb, T3, N0, M0 non-small cell lung cancer.  Current plan is to discuss at medical thoracic oncology conference for consideration of surgical resection versus SBRT. ? ?OV 12/28/2021: Here today for follow-up.  We saw her after her lung cancer diagnosis in September.  She has now completed all of her radiation treatments.  She did meet with thoracic surgery and declined surgical resection.   Overall she is doing well.  Able to get around and do the things that she wants to do.  She is here today with family.  No significant complaints.  We were able to give her a hope tote from the [facility]. ? ? ?Past Medical History:  ?Diagnosis Date  ? ALLERGIC RHINITIS 09/20/2009  ? Allergy   ? Arthritis   ? LEGS and left arm  ? Bladder tumor   ? Cataract   ? both eyes, right worse thsn left  ? COLONIC POLYPS, HX OF 06/06/2007  ? DYSLIPIDEMIA 09/20/2009  ? Dyspnea   ? sob with activity  ? GERD (gastroesophageal reflux disease)   ? History of radiation therapy   ? Right lung- 09/01/21-09/16/21- Dr. [name]  ? HYPERTENSION 06/06/2007  ? Knee pain   ? stiff and pain on side  ? LIVER FUNCTION TESTS, ABNORMAL, HX OF   ? feb or march 2019  ? Peripheral vascular disease ([place])   ? Shingles 11/2019  ? left arm and shoulder  ?  ? ?Family History  ?Problem Relation Age of Onset  ? Colon cancer Neg Hx   ? Colon polyps Neg Hx   ? Esophageal cancer Neg Hx   ? Rectal cancer Neg Hx   ? Stomach cancer Neg Hx   ? Pancreatic cancer Neg Hx   ? Breast cancer Neg Hx   ?  ? ?Past Surgical History:  ?Procedure Laterality Date  ? ABDOMINAL AORTOGRAM W/LOWER EXTREMITY Bilateral 03/03/2020  ? Procedure: ABDOMINAL AORTOGRAM W/LOWER EXTREMITY;  Surgeon: Marty Heck, MD;  Location: [facility]  INVASIVE CV LAB;  Service: Cardiovascular;  Laterality: Bilateral;  ? BREAST EXCISIONAL BIOPSY Left 1975  ? BRONCHIAL BIOPSY  06/14/2021  ? Procedure: BRONCHIAL BIOPSIES;  Surgeon: Garner Nash, DO;  Location: [place] [facility];  Service: Pulmonary;;  ? BRONCHIAL BRUSHINGS  06/14/2021  ? Procedure: BRONCHIAL BRUSHINGS;  Surgeon: Garner Nash, DO;  Location: [place];  Service: Pulmonary;;  ? BRONCHIAL NEEDLE ASPIRATION BIOPSY  06/14/2021  ? Procedure: BRONCHIAL NEEDLE ASPIRATION BIOPSIES;  Surgeon: Garner Nash, DO;  Location: [place] [facility];  Service: Pulmonary;;  ? COLONOSCOPY  ?2015  ? CRYOTHERAPY  06/14/2021  ? Procedure:  CRYOTHERAPY;  Surgeon: Garner Nash, DO;  Location: [place] [facility];  Service: Pulmonary;;  ? CYSTOSCOPY WITH BIOPSY N/A 04/12/2020  ? Procedure: CYSTOSCOPY WITH BIOPSY  INSTILLATION OF GEMCITABINE;  Surgeon: Lucas Mallow, MD;  Location: [facility];  Service: Urology;  Laterality: N/A;  ? DILATION AND CURETTAGE OF UTERUS  yrs ago  ? HEMOSTASIS CONTROL  06/14/2021  ? Procedure: HEMOSTASIS CONTROL;  Surgeon: Garner Nash, DO;  Location: [place] [facility];  Service: Pulmonary;;  ? PERIPHERAL VASCULAR INTERVENTION Right 03/03/2020  ? Procedure: PERIPHERAL VASCULAR INTERVENTION;  Surgeon: Marty Heck, MD;  Location: [place] CV LAB;  Service: Cardiovascular;  Laterality: Right;  ? RADIOACTIVE SEED GUIDED EXCISIONAL BREAST BIOPSY Left 02/02/2021  ? Procedure: RADIOACTIVE SEED GUIDED EXCISIONAL LEFT BREAST BIOPSY;  Surgeon: Stark Klein, MD;  Location: [place];  Service: General;  Laterality: Left;  ? TONSILLECTOMY  1963  ? TOTAL ABDOMINAL HYSTERECTOMY  yrs ago  ? complete  ? TRANSURETHRAL RESECTION OF BLADDER TUMOR N/A 09/18/2018  ? Procedure: RESTAGE TRANSURETHRAL RESECTION OF BLADDER TUMOR (TURBT);  Surgeon: Lucas Mallow, MD;  Location: [facility];  Service: Urology;  Laterality: N/A;  ? TRANSURETHRAL RESECTION OF BLADDER TUMOR WITH MITOMYCIN-C N/A 08/16/2018  ? Procedure: TRANSURETHRAL RESECTION OF BLADDER TUMOR;  Surgeon: Lucas Mallow, MD;  Location: [facility];  Service: Urology;  Laterality: N/A;  ? VIDEO BRONCHOSCOPY WITH ENDOBRONCHIAL ULTRASOUND N/A 06/14/2021  ? Procedure: VIDEO BRONCHOSCOPY WITH ENDOBRONCHIAL ULTRASOUND;  Surgeon: Garner Nash, DO;  Location: [place];  Service: Pulmonary;  Laterality: N/A;  ? ? ?Social History  ? ?Socioeconomic History  ? Marital status: Single  ?  Spouse name: Not on file  ? Number of children: 0  ? Years of education: 6 years college  ? Highest education level: Master's degree  (e.g., MA, MS, MEng, MEd, MSW, MBA)  ?Occupational History  ? Occupation: retired  ?Tobacco Use  ? Smoking status: Former  ?  Packs/day: 1.00  ?  Years: 50.00  ?  Pack years: 50.00  ?  Types: Cigarettes  ?  Start date: 1966  ? Smokeless tobacco: Never  ? Tobacco comments:  ?  Stopped smoking 07/10/2021  ?Vaping Use  ? Vaping Use: Never used  ?Substance and Sexual Activity  ? Alcohol use: Not Currently  ?  Alcohol/week: 2.0 standard drinks  ?  Types: 2 Shots of liquor per week  ?  Comment: daily alcohol  ? Drug use: No  ? Sexual activity: Not on file  ?Other Topics Concern  ? Not on file  ?Social History Narrative  ? Not on file  ? ?Social Determinants of Health  ? ?Financial Resource Strain: Low Risk   ? Difficulty of Paying Living Expenses: Not hard at all  ?Food Insecurity: No Food Insecurity  ? Worried About Running  Out of Food in the Last Year: Never true  ? Ran Out of Food in the Last Year: Never true  ?Transportation Needs: No Transportation Needs  ? Lack of Transportation (Medical): No  ? Lack of Transportation (Non-Medical): No  ?Physical Activity: Inactive  ? Days of Exercise per Week: 0 days  ? Minutes of Exercise per Session: 0 min  ?Stress: No Stress Concern Present  ? Feeling of Stress : Not at all  ?Social Connections: Moderately Integrated  ? Frequency of Communication with Friends and Family: More than three times a week  ? Frequency of Social Gatherings with Friends and Family: More than three times a week  ? Attends Religious Services: More than 4 times per year  ? Active Member of Clubs or Organizations: Yes  ? Attends Archivist Meetings: More than 4 times per year  ? Marital Status: Divorced  ?Intimate Partner Violence: Not At Risk  ? Fear of Current or Ex-Partner: No  ? Emotionally Abused: No  ? Physically Abused: No  ? Sexually Abused: No  ?  ? ?Allergies  ?Allergen Reactions  ? Doxycycline Palpitations  ?  ? ?Outpatient Medications Prior to Visit  ?Medication Sig Dispense Refill  ?  atorvastatin (LIPITOR) 10 MG tablet Take 1 tablet (10 mg total) by mouth daily. 90 tablet 3  ? calcium carbonate (OSCAL) 1500 (600 Ca) MG TABS tablet Take 1,500 mg by mouth 2 (two) times daily with a meal.    ?

## 2022-01-05 ENCOUNTER — Encounter: Payer: Self-pay | Admitting: Family Medicine

## 2022-01-05 ENCOUNTER — Ambulatory Visit (INDEPENDENT_AMBULATORY_CARE_PROVIDER_SITE_OTHER): Payer: Medicare Other | Admitting: Family Medicine

## 2022-01-05 VITALS — BP 119/64 | HR 100 | Temp 97.9°F | Ht 63.0 in | Wt 136.4 lb

## 2022-01-05 DIAGNOSIS — E782 Mixed hyperlipidemia: Secondary | ICD-10-CM | POA: Diagnosis not present

## 2022-01-05 DIAGNOSIS — C3491 Malignant neoplasm of unspecified part of right bronchus or lung: Secondary | ICD-10-CM

## 2022-01-05 DIAGNOSIS — T161XXA Foreign body in right ear, initial encounter: Secondary | ICD-10-CM | POA: Diagnosis not present

## 2022-01-05 DIAGNOSIS — I739 Peripheral vascular disease, unspecified: Secondary | ICD-10-CM | POA: Diagnosis not present

## 2022-01-05 DIAGNOSIS — M79604 Pain in right leg: Secondary | ICD-10-CM | POA: Diagnosis not present

## 2022-01-05 DIAGNOSIS — I700 Atherosclerosis of aorta: Secondary | ICD-10-CM

## 2022-01-05 DIAGNOSIS — M1711 Unilateral primary osteoarthritis, right knee: Secondary | ICD-10-CM

## 2022-01-05 DIAGNOSIS — M85851 Other specified disorders of bone density and structure, right thigh: Secondary | ICD-10-CM | POA: Diagnosis not present

## 2022-01-05 DIAGNOSIS — Z0000 Encounter for general adult medical examination without abnormal findings: Secondary | ICD-10-CM | POA: Diagnosis not present

## 2022-01-05 LAB — LIPID PANEL
Cholesterol: 189 mg/dL (ref 0–200)
HDL: 99.8 mg/dL (ref >39.00)
LDL Cholesterol: 76 mg/dL (ref 0–99)
NonHDL: 89.07
Total CHOL/HDL Ratio: 2
Triglycerides: 67 mg/dL (ref 0.0–149.0)
VLDL: 13.4 mg/dL (ref 0.0–40.0)

## 2022-01-05 LAB — CBC WITH DIFFERENTIAL/PLATELET
Basophils Absolute: 0 10*3/uL (ref 0.0–0.1)
Basophils Relative: 0.6 % (ref 0.0–3.0)
Eosinophils Absolute: 0.2 10*3/uL (ref 0.0–0.7)
Eosinophils Relative: 2.1 % (ref 0.0–5.0)
HCT: 34.4 % — ABNORMAL LOW (ref 36.0–46.0)
Hemoglobin: 11.4 g/dL — ABNORMAL LOW (ref 12.0–15.0)
Lymphocytes Relative: 10.9 % — ABNORMAL LOW (ref 12.0–46.0)
Lymphs Abs: 0.8 10*3/uL (ref 0.7–4.0)
MCHC: 33 g/dL (ref 30.0–36.0)
MCV: 96.7 fl (ref 78.0–100.0)
Monocytes Absolute: 0.6 10*3/uL (ref 0.1–1.0)
Monocytes Relative: 7.5 % (ref 3.0–12.0)
Neutro Abs: 6.1 10*3/uL (ref 1.4–7.7)
Neutrophils Relative %: 78.9 % — ABNORMAL HIGH (ref 43.0–77.0)
Platelets: 182 10*3/uL (ref 150.0–400.0)
RBC: 3.56 Mil/uL — ABNORMAL LOW (ref 3.87–5.11)
RDW: 14.9 % (ref 11.5–15.5)
WBC: 7.7 10*3/uL (ref 4.0–10.5)

## 2022-01-05 LAB — COMPREHENSIVE METABOLIC PANEL
ALT: 14 U/L (ref 0–35)
AST: 25 U/L (ref 0–37)
Albumin: 4.4 g/dL (ref 3.5–5.2)
Alkaline Phosphatase: 82 U/L (ref 39–117)
BUN: 18 mg/dL (ref 6–23)
CO2: 32 mEq/L (ref 19–32)
Calcium: 10 mg/dL (ref 8.4–10.5)
Chloride: 99 mEq/L (ref 96–112)
Creatinine, Ser: 0.78 mg/dL (ref 0.40–1.20)
GFR: 71.25 mL/min (ref >60.00)
Glucose, Bld: 101 mg/dL — ABNORMAL HIGH (ref 70–99)
Potassium: 3.9 mEq/L (ref 3.5–5.1)
Sodium: 139 mEq/L (ref 135–145)
Total Bilirubin: 0.6 mg/dL (ref 0.2–1.2)
Total Protein: 7.9 g/dL (ref 6.0–8.3)

## 2022-01-05 LAB — HEMOGLOBIN A1C: Hgb A1c MFr Bld: 5.4 % (ref 4.6–6.5)

## 2022-01-05 LAB — TSH: TSH: 1.27 u[IU]/mL (ref 0.35–5.50)

## 2022-01-05 LAB — T4, FREE: Free T4: 0.95 ng/dL (ref 0.60–1.60)

## 2022-01-05 LAB — VITAMIN B12: Vitamin B-12: 217 pg/mL (ref 211–911)

## 2022-01-05 LAB — VITAMIN D 25 HYDROXY (VIT D DEFICIENCY, FRACTURES): VITD: 61.08 ng/mL (ref 30.00–100.00)

## 2022-01-05 NOTE — Progress Notes (Signed)
Subjective:  ?  ? Tracy Davidson is a 81 y.o. female and is here for a comprehensive physical exam. The patient states she has been doing well.  Staying busy going to drs appts.  Still having pain in lateral RLE when up moving around.  Tried OTC topical creams without relief.  Tylenol helps some, but she does not take it often.  Seen by Vascular surgery in the past 2/2 h/o stent.  Atherosclerosis of aorta seen on imaging 05/18/21.  R knee OA noted on xray 11/03/20.  Mammogram done 11/30/2020.  Colonoscopy done 12/28/2017.  Bone density done 05/07/2019 with osteopenia in right femoral neck. ? ?Social History  ? ?Socioeconomic History  ? Marital status: Single  ?  Spouse name: Not on file  ? Number of children: 0  ? Years of education: 6 years college  ? Highest education level: Master's degree (e.g., MA, MS, MEng, MEd, MSW, MBA)  ?Occupational History  ? Occupation: retired  ?Tobacco Use  ? Smoking status: Former  ?  Packs/day: 1.00  ?  Years: 50.00  ?  Pack years: 50.00  ?  Types: Cigarettes  ?  Start date: 1966  ? Smokeless tobacco: Never  ? Tobacco comments:  ?  Stopped smoking 07/10/2021  ?Vaping Use  ? Vaping Use: Never used  ?Substance and Sexual Activity  ? Alcohol use: Not Currently  ?  Alcohol/week: 2.0 standard drinks  ?  Types: 2 Shots of liquor per week  ?  Comment: daily alcohol  ? Drug use: No  ? Sexual activity: Not on file  ?Other Topics Concern  ? Not on file  ?Social History Narrative  ? Not on file  ? ?Social Determinants of Health  ? ?Financial Resource Strain: Low Risk   ? Difficulty of Paying Living Expenses: Not hard at all  ?Food Insecurity: No Food Insecurity  ? Worried About Charity fundraiser in the Last Year: Never true  ? Ran Out of Food in the Last Year: Never true  ?Transportation Needs: No Transportation Needs  ? Lack of Transportation (Medical): No  ? Lack of Transportation (Non-Medical): No  ?Physical Activity: Inactive  ? Days of Exercise per Week: 0 days  ? Minutes of Exercise per  Session: 0 min  ?Stress: No Stress Concern Present  ? Feeling of Stress : Not at all  ?Social Connections: Moderately Integrated  ? Frequency of Communication with Friends and Family: More than three times a week  ? Frequency of Social Gatherings with Friends and Family: More than three times a week  ? Attends Religious Services: More than 4 times per year  ? Active Member of Clubs or Organizations: Yes  ? Attends Archivist Meetings: More than 4 times per year  ? Marital Status: Divorced  ?Intimate Partner Violence: Not At Risk  ? Fear of Current or Ex-Partner: No  ? Emotionally Abused: No  ? Physically Abused: No  ? Sexually Abused: No  ? ?Health Maintenance  ?Topic Date Due  ? MAMMOGRAM  11/30/2021  ? Zoster Vaccines- Shingrix (1 of 2) 01/25/2022 (Originally 08/18/1960)  ? TETANUS/TDAP  10/27/2022 (Originally 03/13/2021)  ? Pneumonia Vaccine 74+ Years old  Completed  ? INFLUENZA VACCINE  Completed  ? DEXA SCAN  Completed  ? COVID-19 Vaccine  Completed  ? HPV VACCINES  Aged Out  ? ? ?The following portions of the patient's history were reviewed and updated as appropriate: allergies, current medications, past family history, past medical history, past social  history, past surgical history, and problem list. ? ?Review of Systems ?Pertinent items noted in HPI and remainder of comprehensive ROS otherwise negative.  ? ?Objective:  ? ? BP 119/64 (BP Location: Left Arm, Patient Position: Sitting, Cuff Size: Normal)   Pulse 100   Temp 97.9 ?F (36.6 ?C) (Oral)   Ht 5\' 3"  (1.6 m)   Wt 136 lb 6.4 oz (61.9 kg)   SpO2 100%   BMI 24.16 kg/m?  ?General appearance: alert, cooperative, and no distress ?Head: Normocephalic, without obvious abnormality, atraumatic ?Eyes: conjunctivae/corneas clear. PERRL, EOM's intact. Fundi benign., wearing glasses ?Ears: normal TM's and external ear canals both ears and a small amount of cerumen in bottom of R canal not occluding view of TM. ?Nose: Nares normal. Septum midline.  Mucosa normal. No drainage or sinus tenderness. ?Throat:  lips, gums, mucosa normal, a few teeth missing ?Neck: no adenopathy, no carotid bruit, no JVD, supple, symmetrical, trachea midline, and thyroid not enlarged, symmetric, no tenderness/mass/nodules ?Lungs: clear to auscultation bilaterally ?Heart: regular rate and rhythm, S1, S2 normal, no murmur, click, rub or gallop ?Abdomen: soft, non-tender; bowel sounds normal; no masses,  no organomegaly ?Extremities: extremities normal, atraumatic, no cyanosis or edema ?Pulses: 2+ and symmetric ?Skin: Skin color, texture, turgor normal. No rashes or lesions.  Hyperpigmented skin on neck and upper chest. ?Lymph nodes: Cervical, supraclavicular, and axillary nodes normal. ?Neurologic: Alert and oriented X 3, normal strength and tone. Normal symmetric reflexes. Normal coordination and gait  ?  ?Assessment:  ? ? Healthy female exam with continued RLE pain.   ?  ?Plan:  ? ? Anticipatory guidance given including wearing seatbelts, smoke detectors in the home, increasing physical activity, increasing p.o. intake of water and vegetables. ?-labs ?-pt preference, mammogram scheduled 02/03/22. ?-colonoscopy done 12/27/2017. ?-Bone density scan done 05/07/2019 with osteopenia in right femoral neck. ?-immunizations reviewed.  Consider shingles vaccine. ?-given handout ?-next CPE in 1 yr ?See After Visit Summary for Counseling Recommendations  ? ?Non-small cell lung cancer, right ([place]) ?-Stable ?-former smoker, 60+ yrs.  Continued cessation encouraged. ?-Right middle lobe lesion, pedunculated on bronchoscopy that was debulked and removed with cryotherapy.  -S/p XRT ?-continue f/u with Oncology, pulmonology, rad onc, cardio thoracic ? - Plan: CBC with Differential/Platelet ? ?Foreign body of right ear, initial encounter ?-Consent obtained.  Right ear irrigated.  A bug flushed out of ear.  Pt tolerated procedure well.  ? ?Right leg pain ?-Possibly 2/2 PAD versus OA of right  knee ?-Discussed topical analgesics, heat, stretching/exercising ?-Continue follow-up with vascular ?-Consider referral to Ortho ?- Plan: TSH, T4, Free, Vitamin B12, CMP ? ?PAD (peripheral artery disease) ([place]) ?-Continue statin, Lipitor 10 mg daily ?-Continue Eliquis 5 mg twice daily 2/2 history of DVT ?-f/u with Vascular ? - Plan: CBC with Differential/Platelet ? ?Mixed hyperlipidemia  ?-Lifestyle modifications ?-Continue Lipitor 10 mg daily ?- Plan: CBC with Differential/Platelet, Hemoglobin A1c, Lipid panel ? ?Osteopenia of neck of right femur  ?-Bone density 05/07/2019 with osteopenia as right femoral neck T score -2.2 ?-Optimize daily calcium and vitamin D intake, weightbearing exercises ?- Plan: Vitamin D, 25-hydroxy ? ?Aortic atherosclerosis ([place]) ?-As noted on CT angio chest 05/18/2021 ?-Lifestyle modifications ?-Continue Lipitor 10 mg daily ?- Plan: Lipid panel ? ?Primary osteoarthritis of right knee ?-Tricompartmental OA noted on x-ray 11/03/2020 ?-Topical analgesics, heat, rest. ?-Consider follow-up with Ortho ? ?Follow-up in 4-6 months, sooner if needed ? ?[name], MD ?

## 2022-01-10 ENCOUNTER — Telehealth: Payer: Self-pay | Admitting: *Deleted

## 2022-01-10 NOTE — Telephone Encounter (Signed)
CALLED PATIENT TO INFORM OF CT FOR 01-20-22- ARRIVAL TIME- 2:15 PM @ [facility], NO RESTRICTIONS TO TEST, PATIENT TO RECEIVE CT RESULTS FROM DR. KINARD ON 01-23-22 @ 11:45 AM, SPOKE WITH PATIENT AND SHE IS AWARE OF THESE APPTS. ?

## 2022-01-12 DIAGNOSIS — D090 Carcinoma in situ of bladder: Secondary | ICD-10-CM | POA: Diagnosis not present

## 2022-01-13 DIAGNOSIS — E785 Hyperlipidemia, unspecified: Secondary | ICD-10-CM | POA: Diagnosis not present

## 2022-01-13 DIAGNOSIS — I10 Essential (primary) hypertension: Secondary | ICD-10-CM | POA: Diagnosis not present

## 2022-01-20 ENCOUNTER — Ambulatory Visit (HOSPITAL_COMMUNITY)
Admission: RE | Admit: 2022-01-20 | Discharge: 2022-01-20 | Disposition: A | Discharge status: Home / Self Care | Payer: Medicare Other | Source: Ambulatory Visit | Attending: Radiation Oncology | Admitting: Radiation Oncology

## 2022-01-20 DIAGNOSIS — R911 Solitary pulmonary nodule: Secondary | ICD-10-CM | POA: Diagnosis not present

## 2022-01-20 DIAGNOSIS — C3490 Malignant neoplasm of unspecified part of unspecified bronchus or lung: Secondary | ICD-10-CM | POA: Diagnosis not present

## 2022-01-20 DIAGNOSIS — I700 Atherosclerosis of aorta: Secondary | ICD-10-CM | POA: Diagnosis not present

## 2022-01-20 DIAGNOSIS — C3491 Malignant neoplasm of unspecified part of right bronchus or lung: Secondary | ICD-10-CM | POA: Insufficient documentation

## 2022-01-22 NOTE — Progress Notes (Signed)
?Radiation Oncology         [phone] ?________________________________ ? ?Name: Tracy Davidson MRN: [id number]  ?Date: 01/23/2022  DOB: 1941/03/21 ? ?Follow-Up Visit Note ? ?CC: Billie Ruddy, MD  Curt Bears, MD ? ?  ICD-10-CM   ?1. Non-small cell lung cancer, right ([place])  C34.91 CT CHEST WO CONTRAST  ?  ?2. Endobronchial cancer, right ([place])  C34.91   ?  ? ? ?Diagnosis:  Stage IIB (cT3, cN0, cM0) Right non-small cell lung cancer (adenocarcinoma) ? ?Interval Since Last Radiation: 4 months and 8 days  ? ?Radiation Treatment Dates: 09/01/2021 through 09/16/2021 ?Site Technique Total Dose (Gy) Dose per Fx (Gy) Completed Fx Beam Energies  ?Lung, Right: Lung_Rt IMRT 50/50 5 10/10 6XFFF  ? ? ?Narrative:  The patient returns today for routine follow-up and to review recent imaging, she was last seen here for follow up on 10/24/21.  Since her last visit, the patient followed up with Dr. Valeta Harms on 12/28/21. During which time, the patient was noted to be doing well overall and reported no difficulties moving around or completing her ADL's.       ? ?Her most recent chest CT on 01/20/22 showed a significant interval reduction in size of a previously seen ?hypermetabolic mass of the central right middle lobe, consistent with good treatment response. CT also showed an unchanged, likely benign, 0.6 cm subcentimeter nodule of the posterior lateral segment right middle lobe, abutting and tenting the major fissure. ? ?Otherwise, no significant interval history since the patient was last seen.  ? ?She denies any pain within the chest area significant cough or hemoptysis.  She reports her breathing is stable. ? ?Allergies:  is allergic to doxycycline. ? ?Meds: ?Current Outpatient Medications  ?Medication Sig Dispense Refill  ? atorvastatin (LIPITOR) 10 MG tablet Take 1 tablet (10 mg total) by mouth daily. 90 tablet 3  ? calcium carbonate (OSCAL) 1500 (600 Ca) MG TABS tablet Take 1,500 mg by mouth 2 (two) times daily with a  meal.    ? Cholecalciferol (VITAMIN D) 50 MCG (2000 UT) CAPS Take 5,000 Units by mouth daily.     ? ELIQUIS 5 MG TABS tablet TAKE 1 TABLET BY MOUTH TWICE A DAY 60 tablet 2  ? KLOR-CON M20 20 MEQ tablet TAKE 1 TABLET BY MOUTH EVERY DAY 90 tablet 2  ? lisinopril-hydrochlorothiazide (ZESTORETIC) 20-25 MG tablet TAKE 1 TABLET BY MOUTH EVERY DAY 90 tablet 0  ? Melatonin 5 MG CAPS Take 5 mg by mouth at bedtime as needed (sleep).    ? nicotine polacrilex (COMMIT) 2 MG lozenge Take 2 mg by mouth as needed for smoking cessation.    ? omeprazole (PRILOSEC) 20 MG capsule TAKE 1 CAPSULE BY MOUTH EVERY DAY 90 capsule 1  ? triamcinolone ointment (KENALOG) 0.1 % Apply 1 application topically 2 (two) times daily.    ? ?No current facility-administered medications for this encounter.  ? ? ?Physical Findings: ?The patient is in no acute distress. Patient is alert and oriented. ? height is 5\' 3"  (1.6 m) and weight is 136 lb (61.7 kg). Her temporal temperature is 97.8 ?F (36.6 ?C). Her blood pressure is 134/70 and her pulse is 97. Her respiration is 20 and oxygen saturation is 100%. .  No significant changes. Lungs are clear to auscultation bilaterally. Heart has regular rate and rhythm. No palpable cervical, supraclavicular, or axillary adenopathy. Abdomen soft, non-tender, normal bowel sounds.  ? ? ?Lab Findings: ?Lab Results  ?Component Value Date  ?  WBC 7.7 01/05/2022  ? HGB 11.4 (L) 01/05/2022  ? HCT 34.4 (L) 01/05/2022  ? MCV 96.7 01/05/2022  ? PLT 182.0 01/05/2022  ? ? ?Radiographic Findings: ?CT CHEST WO CONTRAST ? ?Result Date: 01/20/2022 ?CLINICAL DATA:  Non-small cell lung cancer restaging, assess treatment response * Tracking Code: BO * EXAM: CT CHEST WITHOUT CONTRAST TECHNIQUE: Multidetector CT imaging of the chest was performed following the standard protocol without IV contrast. RADIATION DOSE REDUCTION: This exam was performed according to the departmental dose-optimization program which includes automated exposure  control, adjustment of the mA and/or kV according to patient size and/or use of iterative reconstruction technique. COMPARISON:  PET-CT, 06/15/2021 FINDINGS: Cardiovascular: Aortic atherosclerosis. Normal heart size. Scattered three-vessel coronary artery calcifications. No pericardial effusion. Mediastinum/Nodes: No enlarged mediastinal, hilar, or axillary lymph nodes. Thyroid gland, trachea, and esophagus demonstrate no significant findings. Lungs/Pleura: Significant interval reduction in size of a previously seen hypermetabolic mass of the central right middle lobe, now measuring 1.3 x 0.9 cm, previously 2.7 x 2.5 cm (series 7, image 68). Unchanged 0.6 cm subcentimeter nodule of the posterior lateral segment right middle lobe abutting and tenting the major fissure (series 7, image 75). No pleural effusion or pneumothorax. Upper Abdomen: No acute abnormality. Benign, intrinsically hyperdense hemorrhagic or proteinaceous cysts of the superior pole of left kidney (series 2, image 145). Fluid attenuation liver cysts. Musculoskeletal: No chest wall abnormality. No suspicious osseous lesions identified. IMPRESSION: 1. Significant interval reduction in size of a previously seen hypermetabolic mass of the central right middle lobe, consistent with treatment response. 2. Unchanged 0.6 cm subcentimeter nodule of the posterior lateral segment right middle lobe abutting and tenting the major fissure, most likely benign and incidental. Attention on follow-up. 3. Coronary artery disease. Aortic Atherosclerosis (ICD10-I70.0). Electronically Signed   By: Delanna Ahmadi M.D.   On: 01/20/2022 16:09   ? ?Impression:  Stage IIB (cT3, cN0, cM0) Right non-small cell lung cancer (adenocarcinoma) ? ?No evidence of recurrence on clinical exam today.  Recent chest CT scan very favorable. ? ?Plan: Routine follow-up in 6 months.  Prior to this follow-up appointment the patient will have another chest CT scan. ? ? ?20 minutes of total time  was spent for this patient encounter, including preparation, face-to-face counseling with the patient and coordination of care, physical exam, and documentation of the encounter. ?____________________________________ ? ?Blair Promise, PhD, MD ? ?This document serves as a record of services personally performed by [name], MD. It was created on his behalf by Roney Mans, a trained medical scribe. The creation of this record is based on the scribe's personal observations and the provider's statements to them. This document has been checked and approved by the attending provider. ? ?

## 2022-01-23 ENCOUNTER — Ambulatory Visit
Admission: RE | Admit: 2022-01-23 | Discharge: 2022-01-23 | Disposition: A | Discharge status: Home / Self Care | Payer: Medicare Other | Source: Ambulatory Visit | Attending: Radiation Oncology | Admitting: Radiation Oncology

## 2022-01-23 ENCOUNTER — Other Ambulatory Visit: Payer: Self-pay

## 2022-01-23 ENCOUNTER — Encounter: Payer: Self-pay | Admitting: Radiation Oncology

## 2022-01-23 VITALS — BP 134/70 | HR 97 | Temp 97.8°F | Resp 20 | Ht 63.0 in | Wt 136.0 lb

## 2022-01-23 DIAGNOSIS — Z7901 Long term (current) use of anticoagulants: Secondary | ICD-10-CM | POA: Diagnosis not present

## 2022-01-23 DIAGNOSIS — Z87891 Personal history of nicotine dependence: Secondary | ICD-10-CM | POA: Diagnosis not present

## 2022-01-23 DIAGNOSIS — K7689 Other specified diseases of liver: Secondary | ICD-10-CM | POA: Insufficient documentation

## 2022-01-23 DIAGNOSIS — Z923 Personal history of irradiation: Secondary | ICD-10-CM | POA: Diagnosis not present

## 2022-01-23 DIAGNOSIS — I2510 Atherosclerotic heart disease of native coronary artery without angina pectoris: Secondary | ICD-10-CM | POA: Diagnosis not present

## 2022-01-23 DIAGNOSIS — Z79899 Other long term (current) drug therapy: Secondary | ICD-10-CM | POA: Diagnosis not present

## 2022-01-23 DIAGNOSIS — Z85118 Personal history of other malignant neoplasm of bronchus and lung: Secondary | ICD-10-CM | POA: Diagnosis not present

## 2022-01-23 DIAGNOSIS — I700 Atherosclerosis of aorta: Secondary | ICD-10-CM | POA: Insufficient documentation

## 2022-01-23 DIAGNOSIS — C342 Malignant neoplasm of middle lobe, bronchus or lung: Secondary | ICD-10-CM | POA: Diagnosis not present

## 2022-01-23 DIAGNOSIS — C3491 Malignant neoplasm of unspecified part of right bronchus or lung: Secondary | ICD-10-CM

## 2022-01-23 NOTE — Progress Notes (Signed)
Tracy Davidson is here today for follow up post radiation to the lung. ? ?Lung Side: RT ? ?Does the patient complain of any of the following: ?Pain: No ?Shortness of breath w/wo exertion: Yes w/ exertion ?Cough: Mild on occasion ?Hemoptysis: No ?Pain with swallowing: No ?Swallowing/choking concerns: No ?Appetite: Moderate ?Energy Level: Fair ?Post radiation skin Changes: No ? ? ? ?Additional comments if applicable: N/A ?

## 2022-01-31 DIAGNOSIS — C678 Malignant neoplasm of overlapping sites of bladder: Secondary | ICD-10-CM | POA: Diagnosis not present

## 2022-01-31 DIAGNOSIS — Z5111 Encounter for antineoplastic chemotherapy: Secondary | ICD-10-CM | POA: Diagnosis not present

## 2022-02-03 ENCOUNTER — Other Ambulatory Visit: Payer: Self-pay | Admitting: Family Medicine

## 2022-02-03 ENCOUNTER — Ambulatory Visit
Admission: RE | Admit: 2022-02-03 | Discharge: 2022-02-03 | Disposition: A | Discharge status: Home / Self Care | Payer: Medicare Other | Source: Ambulatory Visit | Attending: Family Medicine | Admitting: Family Medicine

## 2022-02-03 DIAGNOSIS — Z9889 Other specified postprocedural states: Secondary | ICD-10-CM

## 2022-02-03 DIAGNOSIS — I10 Essential (primary) hypertension: Secondary | ICD-10-CM

## 2022-02-03 DIAGNOSIS — Z1231 Encounter for screening mammogram for malignant neoplasm of breast: Secondary | ICD-10-CM | POA: Diagnosis not present

## 2022-02-07 DIAGNOSIS — C678 Malignant neoplasm of overlapping sites of bladder: Secondary | ICD-10-CM | POA: Diagnosis not present

## 2022-02-07 DIAGNOSIS — Z5111 Encounter for antineoplastic chemotherapy: Secondary | ICD-10-CM | POA: Diagnosis not present

## 2022-02-14 DIAGNOSIS — C678 Malignant neoplasm of overlapping sites of bladder: Secondary | ICD-10-CM | POA: Diagnosis not present

## 2022-02-14 DIAGNOSIS — Z5111 Encounter for antineoplastic chemotherapy: Secondary | ICD-10-CM | POA: Diagnosis not present

## 2022-03-01 ENCOUNTER — Ambulatory Visit (INDEPENDENT_AMBULATORY_CARE_PROVIDER_SITE_OTHER): Payer: Medicare Other

## 2022-03-01 ENCOUNTER — Ambulatory Visit (INDEPENDENT_AMBULATORY_CARE_PROVIDER_SITE_OTHER): Payer: Medicare Other | Admitting: Family Medicine

## 2022-03-01 VITALS — BP 104/58 | HR 116 | Temp 98.5°F

## 2022-03-01 DIAGNOSIS — C3491 Malignant neoplasm of unspecified part of right bronchus or lung: Secondary | ICD-10-CM

## 2022-03-01 DIAGNOSIS — M1611 Unilateral primary osteoarthritis, right hip: Secondary | ICD-10-CM | POA: Diagnosis not present

## 2022-03-01 DIAGNOSIS — R5381 Other malaise: Secondary | ICD-10-CM

## 2022-03-01 DIAGNOSIS — M79651 Pain in right thigh: Secondary | ICD-10-CM

## 2022-03-01 DIAGNOSIS — M545 Low back pain, unspecified: Secondary | ICD-10-CM

## 2022-03-01 DIAGNOSIS — Z85118 Personal history of other malignant neoplasm of bronchus and lung: Secondary | ICD-10-CM | POA: Diagnosis not present

## 2022-03-01 DIAGNOSIS — I700 Atherosclerosis of aorta: Secondary | ICD-10-CM

## 2022-03-01 NOTE — Patient Instructions (Signed)
You can use topical creams such as Biofreeze, Aspercreme, IcyHot, etc. For your lower back pain and leg pain.  These can be found at your local drugstore.  You can also use heat and try exercises to help strengthen the muscles in your legs.  Chair exercises are a great way to do this.  I have included a handout on these. ? ?I printed out the information regarding the pubic rami fracture noted last year. ?

## 2022-03-01 NOTE — Progress Notes (Unsigned)
Subjective:    Patient ID: Tracy Davidson, female    DOB: 1941/03/19, 81 y.o.   MRN: [id number]  Chief Complaint  Patient presents with   Pain    Bilateral pain in hips, thighs, and buttocks.aching pain, when sitting or in bed, can not lay on sides any more. Started last week for about 2-3 days, pain comes and goes. Feels it most when sitting and laying down. Taken tylenol, helped a little.   Pt accompanied by a friend.  HPI Patient is an 39 female with pmh sig for h/o chronic DVT, HTN, PAD, R external iliac stent, R endobronchial cancer s/p XRT, bladder cancer, GERD, HLD, polycythemia, former smoker who was seen today for acute concern.  Pt endorse low back, R inner thigh pain.  Pain is intermittent and dull, worse with sitting or laying down.  Pt notes weakness in LLE x 2 days.  Pt denies falls, numbness or tingling in LEs.  Tried Tylenol xtra strength.  Past Medical History:  Diagnosis Date   ALLERGIC RHINITIS 09/20/2009   Allergy    Arthritis    LEGS and left arm   Bladder tumor    Cataract    both eyes, right worse thsn left   COLONIC POLYPS, HX OF 06/06/2007   DYSLIPIDEMIA 09/20/2009   Dyspnea    sob with activity   GERD (gastroesophageal reflux disease)    History of radiation therapy    Right lung- 09/01/21-09/16/21- Dr. [name]   HYPERTENSION 06/06/2007   Knee pain    stiff and pain on side   LIVER FUNCTION TESTS, ABNORMAL, HX OF    feb or march 2019   Peripheral vascular disease ([place])    Shingles 11/2019   left arm and shoulder    Allergies  Allergen Reactions   Doxycycline Palpitations    ROS General: Denies fever, chills, night sweats, changes in weight, changes in appetite HEENT: Denies headaches, ear pain, changes in vision, rhinorrhea, sore throat CV: Denies CP, palpitations, SOB, orthopnea Pulm: Denies SOB, cough, wheezing GI: Denies abdominal pain, nausea, vomiting, diarrhea, constipation GU: Denies dysuria, hematuria, frequency, vaginal  discharge Msk: Denies muscle cramps, joint pains  +R thigh pain, hip pain, low back pain Neuro: Denies weakness, numbness, tingling Skin: Denies rashes, bruising Psych: Denies depression, anxiety, hallucinations      Objective:    Blood pressure (!) 104/58, pulse (!) 116, temperature 98.5 F (36.9 C), temperature source Oral, SpO2 95 %.  Gen. Pleasant, well-nourished, in no distress, normal affect   HEENT: [place]/AT, face symmetric, conjunctiva clear, no scleral icterus, PERRLA, EOMI, nares patent without drainage, pharynx without erythema or exudate. Neck: No JVD, no thyromegaly, no carotid bruits Lungs: no accessory muscle use, CTAB, no wheezes or rales Cardiovascular: RRR, no m/r/g, no peripheral edema.  No TTP of bilateral calves. Musculoskeletal: TTP of midline lumbar spine.  No TTP of b/l hips of LEs.  No deformities, no cyanosis or clubbing, normal tone Neuro:  A&Ox3, CN II-XII intact, gait not assessed sitting in transport wheelchair Skin:  Warm, no lesions/ rash   Wt Readings from Last 3 Encounters:  01/23/22 136 lb (61.7 kg)  01/05/22 136 lb 6.4 oz (61.9 kg)  12/28/21 133 lb 9.6 oz (60.6 kg)    Lab Results  Component Value Date   WBC 7.7 01/05/2022   HGB 11.4 (L) 01/05/2022   HCT 34.4 (L) 01/05/2022   PLT 182.0 01/05/2022   GLUCOSE 101 (H) 01/05/2022   CHOL 189 01/05/2022  TRIG 67.0 01/05/2022   HDL 99.80 01/05/2022   LDLDIRECT 143.2 03/14/2011   LDLCALC 76 01/05/2022   ALT 14 01/05/2022   AST 25 01/05/2022   NA 139 01/05/2022   K 3.9 01/05/2022   CL 99 01/05/2022   CREATININE 0.78 01/05/2022   BUN 18 01/05/2022   CO2 32 01/05/2022   TSH 1.27 01/05/2022   HGBA1C 5.4 01/05/2022    Assessment/Plan:  Lumbar back pain -Advised symptoms likely 2/2 arthritis -X-ray lumbar spine 05/28/2019 with mild to moderate degenerative changes in the intradiscal spaces of lumbar spine.  Spinal listhesis at L4-5 and L5-S1 present.  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 -continue f/u with Ortho and Vasc Surg. - Plan: DG FEMUR, MIN 2 VIEWS RIGHT  Physical deconditioning -limited 2/2 chronic pain in back and LEs -pt to consider PT -discussed chair exercises  Aortic atherosclerosis ([place]) -seen on CT angio chest 05/18/21 -continue lipitor 10 mg   Non-small cell lung cancer, right ([place]) -R middle lobe lesion, calculated on bronchoscopy debulked and removed with cryotherapy. -Status post XRT -Continued smoking cessation encouraged -Continue follow-up with pulmonology, rad onc, cardiothoracics, oncology  F/u prn  [name], MD

## 2022-03-09 ENCOUNTER — Other Ambulatory Visit: Payer: Self-pay | Admitting: *Deleted

## 2022-03-09 DIAGNOSIS — I739 Peripheral vascular disease, unspecified: Secondary | ICD-10-CM

## 2022-03-09 DIAGNOSIS — I700 Atherosclerosis of aorta: Secondary | ICD-10-CM

## 2022-03-16 ENCOUNTER — Encounter: Payer: Self-pay | Admitting: Family Medicine

## 2022-03-21 ENCOUNTER — Ambulatory Visit (INDEPENDENT_AMBULATORY_CARE_PROVIDER_SITE_OTHER)
Admission: RE | Admit: 2022-03-21 | Discharge: 2022-03-21 | Disposition: A | Discharge status: Home / Self Care | Payer: Medicare Other | Source: Ambulatory Visit | Attending: Vascular Surgery | Admitting: Vascular Surgery

## 2022-03-21 ENCOUNTER — Ambulatory Visit: Payer: Medicare Other | Admitting: Vascular Surgery

## 2022-03-21 ENCOUNTER — Encounter: Payer: Self-pay | Admitting: Vascular Surgery

## 2022-03-21 ENCOUNTER — Ambulatory Visit (HOSPITAL_COMMUNITY)
Admission: RE | Admit: 2022-03-21 | Discharge: 2022-03-21 | Disposition: A | Discharge status: Home / Self Care | Payer: Medicare Other | Source: Ambulatory Visit | Attending: Vascular Surgery | Admitting: Vascular Surgery

## 2022-03-21 VITALS — BP 162/89 | HR 102 | Temp 97.3°F | Resp 14 | Ht 63.0 in | Wt 117.0 lb

## 2022-03-21 DIAGNOSIS — I739 Peripheral vascular disease, unspecified: Secondary | ICD-10-CM

## 2022-03-21 DIAGNOSIS — I700 Atherosclerosis of aorta: Secondary | ICD-10-CM

## 2022-03-21 NOTE — Progress Notes (Signed)
Patient name: Tracy Davidson MRN: [id number] DOB: [birth year] Sex: female  REASON FOR CONSULT: 1 year year follow-up after right external iliac artery stent  HPI: Tracy Davidson is a 81 y.o. female, with history of hypertension, dyslipidemia, and tobacco abuse that presents for 1 year follow-up after right external iliac artery stent.  She had a right external iliac angioplasty and stent with a 7 mm x 60 mm Innova on 03/03/2020.  This was in the setting of vague right leg pain with calf and thigh cramping.  I initially sent her back to the PCP to be reevaluated and she was then sent back to me for further evaluation after no further etiology could be discovered.  She does think this helped her ultimately.  Today she complains of right thigh and left hip pain.  She is walking with a cane.  No rest pain or tissue loss to suggest CLI.    Past Medical History:  Diagnosis Date   ALLERGIC RHINITIS 09/20/2009   Allergy    Arthritis    LEGS and left arm   Bladder tumor    Cataract    both eyes, right worse thsn left   COLONIC POLYPS, HX OF 06/06/2007   DYSLIPIDEMIA 09/20/2009   Dyspnea    sob with activity   GERD (gastroesophageal reflux disease)    History of radiation therapy    Right lung- 09/01/21-09/16/21- Dr. [name]   HYPERTENSION 06/06/2007   Knee pain    stiff and pain on side   LIVER FUNCTION TESTS, ABNORMAL, HX OF    feb or march 2019   Peripheral vascular disease ([place])    Shingles 11/2019   left arm and shoulder    Past Surgical History:  Procedure Laterality Date   ABDOMINAL AORTOGRAM W/LOWER EXTREMITY Bilateral 03/03/2020   Procedure: ABDOMINAL AORTOGRAM W/LOWER EXTREMITY;  Surgeon: Marty Heck, MD;  Location: [facility];  Service: Cardiovascular;  Laterality: Bilateral;   BREAST BIOPSY Left 02/02/2021   BREAST EXCISIONAL BIOPSY Left 1975   BRONCHIAL BIOPSY  06/14/2021   Procedure: BRONCHIAL BIOPSIES;  Surgeon: Garner Nash, DO;   Location: [facility];  Service: Pulmonary;;   BRONCHIAL BRUSHINGS  06/14/2021   Procedure: BRONCHIAL BRUSHINGS;  Surgeon: Garner Nash, DO;  Location: [facility];  Service: Pulmonary;;   BRONCHIAL NEEDLE ASPIRATION BIOPSY  06/14/2021   Procedure: BRONCHIAL NEEDLE ASPIRATION BIOPSIES;  Surgeon: Garner Nash, DO;  Location: [facility];  Service: Pulmonary;;   COLONOSCOPY  ?2015   CRYOTHERAPY  06/14/2021   Procedure: CRYOTHERAPY;  Surgeon: Garner Nash, DO;  Location: [facility];  Service: Pulmonary;;   CYSTOSCOPY WITH BIOPSY N/A 04/12/2020   Procedure: CYSTOSCOPY WITH BIOPSY  INSTILLATION OF GEMCITABINE;  Surgeon: Lucas Mallow, MD;  Location: [facility];  Service: Urology;  Laterality: N/A;   DILATION AND CURETTAGE OF UTERUS  yrs ago   HEMOSTASIS CONTROL  06/14/2021   Procedure: HEMOSTASIS CONTROL;  Surgeon: Garner Nash, DO;  Location: [facility];  Service: Pulmonary;;   PERIPHERAL VASCULAR INTERVENTION Right 03/03/2020   Procedure: PERIPHERAL VASCULAR INTERVENTION;  Surgeon: Marty Heck, MD;  Location: [facility];  Service: Cardiovascular;  Laterality: Right;   RADIOACTIVE SEED GUIDED EXCISIONAL BREAST BIOPSY Left 02/02/2021   Procedure: RADIOACTIVE SEED GUIDED EXCISIONAL LEFT BREAST BIOPSY;  Surgeon: Stark Klein, MD;  Location: [facility];  Service: General;  Laterality: Left;   [place]  TOTAL ABDOMINAL HYSTERECTOMY  yrs ago   complete   TRANSURETHRAL RESECTION OF BLADDER TUMOR N/A 09/18/2018   Procedure: RESTAGE TRANSURETHRAL RESECTION OF BLADDER TUMOR (TURBT);  Surgeon: Lucas Mallow, MD;  Location: [facility];  Service: Urology;  Laterality: N/A;   TRANSURETHRAL RESECTION OF BLADDER TUMOR WITH MITOMYCIN-C N/A 08/16/2018   Procedure: TRANSURETHRAL RESECTION OF BLADDER TUMOR;  Surgeon: Lucas Mallow, MD;  Location: [facility];  Service: Urology;   Laterality: N/A;   VIDEO BRONCHOSCOPY WITH ENDOBRONCHIAL ULTRASOUND N/A 06/14/2021   Procedure: VIDEO BRONCHOSCOPY WITH ENDOBRONCHIAL ULTRASOUND;  Surgeon: Garner Nash, DO;  Location: [facility];  Service: Pulmonary;  Laterality: N/A;    Family History  Problem Relation Age of Onset   Colon cancer Neg Hx    Colon polyps Neg Hx    Esophageal cancer Neg Hx    Rectal cancer Neg Hx    Stomach cancer Neg Hx    Pancreatic cancer Neg Hx    Breast cancer Neg Hx     SOCIAL HISTORY: Social History   Socioeconomic History   Marital status: Single    Spouse name: Not on file   Number of children: 0   Years of education: 63 years college   Highest education level: Master's degree (e.g., MA, MS, MEng, MEd, MSW, MBA)  Occupational History   Occupation: retired  Tobacco Use   Smoking status: Former    Packs/day: 1.00    Years: 50.00    Pack years: 50.00    Types: Cigarettes    Start date: 1966   Smokeless tobacco: Never   Tobacco comments:    Stopped smoking 07/10/2021  Vaping Use   Vaping Use: Never used  Substance and Sexual Activity   Alcohol use: Not Currently    Alcohol/week: 2.0 standard drinks    Types: 2 Shots of liquor per week    Comment: daily alcohol   Drug use: No   Sexual activity: Not on file  Other Topics Concern   Not on file  Social History Narrative   Not on file   Social Determinants of Health   Financial Resource Strain: Low Risk    Difficulty of Paying Living Expenses: Not hard at all  Food Insecurity: No Food Insecurity   Worried About Charity fundraiser in the Last Year: Never true   Ran Out of Food in the Last Year: Never true  Transportation Needs: No Transportation Needs   Lack of Transportation (Medical): No   Lack of Transportation (Non-Medical): No  Physical Activity: Inactive   Days of Exercise per Week: 0 days   Minutes of Exercise per Session: 0 min  Stress: No Stress Concern Present   Feeling of Stress : Not at all  Social  Connections: Moderately Integrated   Frequency of Communication with Friends and Family: More than three times a week   Frequency of Social Gatherings with Friends and Family: More than three times a week   Attends Religious Services: More than 4 times per year   Active Member of [employer] or Organizations: Yes   Attends Music therapist: More than 4 times per year   Marital Status: Divorced  Human resources officer Violence: Not At Risk   Fear of Current or Ex-Partner: No   Emotionally Abused: No   Physically Abused: No   Sexually Abused: No    Allergies  Allergen Reactions   Doxycycline Palpitations    Current Outpatient Medications  Medication Sig  Dispense Refill   atorvastatin (LIPITOR) 10 MG tablet Take 1 tablet (10 mg total) by mouth daily. 90 tablet 3   calcium carbonate (OSCAL) 1500 (600 Ca) MG TABS tablet Take 1,500 mg by mouth 2 (two) times daily with a meal.     Cholecalciferol (VITAMIN D) 50 MCG (2000 UT) CAPS Take 5,000 Units by mouth daily.      ELIQUIS 5 MG TABS tablet TAKE 1 TABLET BY MOUTH TWICE A DAY 60 tablet 2   KLOR-CON M20 20 MEQ tablet TAKE 1 TABLET BY MOUTH EVERY DAY 90 tablet 2   lisinopril-hydrochlorothiazide (ZESTORETIC) 20-25 MG tablet TAKE 1 TABLET BY MOUTH EVERY DAY 90 tablet 1   Melatonin 5 MG CAPS Take 5 mg by mouth at bedtime as needed (sleep).     omeprazole (PRILOSEC) 20 MG capsule TAKE 1 CAPSULE BY MOUTH EVERY DAY 90 capsule 1   triamcinolone ointment (KENALOG) 0.1 % Apply 1 application topically 2 (two) times daily.     nicotine polacrilex (COMMIT) 2 MG lozenge Take 2 mg by mouth as needed for smoking cessation. (Patient not taking: Reported on 03/21/2022)     predniSONE (DELTASONE) 20 MG tablet PLEASE SEE ATTACHED FOR DETAILED DIRECTIONS (Patient not taking: Reported on 03/21/2022)     No current facility-administered medications for this visit.    REVIEW OF SYSTEMS:  [X]  denotes positive finding, [ ]  denotes negative finding Cardiac   Comments:  Chest pain or chest pressure:    Shortness of breath upon exertion:    Short of breath when lying flat:    Irregular heart rhythm:        Vascular    Pain in calf, thigh, or hip brought on by ambulation:    Pain in feet at night that wakes you up from your sleep:     Blood clot in your veins:    Leg swelling:         Pulmonary    Oxygen at home:    Productive cough:     Wheezing:         Neurologic    Sudden weakness in arms or legs:     Sudden numbness in arms or legs:     Sudden onset of difficulty speaking or slurred speech:    Temporary loss of vision in one eye:     Problems with dizziness:         Gastrointestinal    Blood in stool:     Vomited blood:         Genitourinary    Burning when urinating:     Blood in urine:        Psychiatric    Major depression:         Hematologic    Bleeding problems:    Problems with blood clotting too easily:        Skin    Rashes or ulcers:        Constitutional    Fever or chills:      PHYSICAL EXAM: Vitals:   03/21/22 1116  BP: (!) 162/89  Pulse: (!) 102  Resp: 14  Temp: (!) 97.3 F (36.3 C)  TempSrc: Temporal  SpO2: 95%  Weight: 117 lb (53.1 kg)  Height: 5\' 3"  (1.6 m)    GENERAL: The patient is a well-nourished female, in no acute distress. The vital signs are documented above. CARDIAC: There is a regular rate and rhythm.  VASCULAR:  Palpable femoral pulses bilaterally Bilateral DP pulses palpable  PULMONARY: No respiratory distress. ABDOMEN: Soft and non-tender. MUSCULOSKELETAL: There are no major deformities or cyanosis.  DATA:   Aortoiliac duplex shows a widely patent right external iliac stent  ABIs are 1.19 on the right triphasic and 1.12 on the left triphasic  Assessment/Plan:  81 year old female now status post right external iliac artery angioplasty with stent placement on 03/03/20 for right calf/thigh cramping.  Her right external iliac stent looks widely patent today.  She has  normal ABI at the ankle.  She has a palpable dorsalis pedis pulse in the right foot.  I think she is doing great from a vascular standpoint.  Discussed that her right thigh and left hip pain is not related to a vascular etiology again given normal exam and normal noninvasive imaging.  I will see her again in 1 year in the [facility] clinic for surveillance with noninvasive imaging.   Marty Heck, MD [facility] Office: [place]

## 2022-03-22 ENCOUNTER — Telehealth: Payer: Self-pay | Admitting: Pharmacist

## 2022-03-22 NOTE — Chronic Care Management (AMB) (Signed)
Chronic Care Management Pharmacy Assistant   Name: Tracy Davidson  MRN: [id number] DOB: May 09, 1941  Reason for Encounter: Disease State / Hypertension Assessment Call   Conditions to be addressed/monitored: HTN  Recent office visits:  03/01/2022 [name] - Patient was seen for lumbar back pain and additional issues. No medication changes. Follow up in 1 month.   01/05/2022 [name] MD - Patient was seen for well adult exam and additional issues. Consider starting an over-the-counter vitamin B12 supplement of at least 1000-2000 IUs daily. Follow up in 6 months.   Recent consult visits:  03/21/2022 [name] MD (vascular) - Patient was seen for PAD (peripheral artery disease). No medication changes. No follow up noted.   01/23/2022 (12 hrs) [name] MD [facility]) - Patient was seen for non-small cell lung cancer. No other chart notes.   12/28/2021 [name] DO (pulmonary) - Patient was seen for Right middle lobe pulmonary nodule and additional issues. Follow up in 1 week.   Hospital visits:  None  Medications: Outpatient Encounter Medications as of 03/22/2022  Medication Sig   atorvastatin (LIPITOR) 10 MG tablet Take 1 tablet (10 mg total) by mouth daily.   calcium carbonate (OSCAL) 1500 (600 Ca) MG TABS tablet Take 1,500 mg by mouth 2 (two) times daily with a meal.   Cholecalciferol (VITAMIN D) 50 MCG (2000 UT) CAPS Take 5,000 Units by mouth daily.    ELIQUIS 5 MG TABS tablet TAKE 1 TABLET BY MOUTH TWICE A DAY   KLOR-CON M20 20 MEQ tablet TAKE 1 TABLET BY MOUTH EVERY DAY   lisinopril-hydrochlorothiazide (ZESTORETIC) 20-25 MG tablet TAKE 1 TABLET BY MOUTH EVERY DAY   Melatonin 5 MG CAPS Take 5 mg by mouth at bedtime as needed (sleep).   nicotine polacrilex (COMMIT) 2 MG lozenge Take 2 mg by mouth as needed for smoking cessation. (Patient not taking: Reported on 03/21/2022)   omeprazole (PRILOSEC) 20 MG capsule TAKE 1 CAPSULE BY MOUTH EVERY DAY    predniSONE (DELTASONE) 20 MG tablet PLEASE SEE ATTACHED FOR DETAILED DIRECTIONS (Patient not taking: Reported on 03/21/2022)   triamcinolone ointment (KENALOG) 0.1 % Apply 1 application topically 2 (two) times daily.   No facility-administered encounter medications on file as of 03/22/2022.   Fill History: ATORVASTATIN 10 MG TABLET 12/30/2021 90   ELIQUIS 5 MG TABLET 02/28/2022 30   KLOR-CON M20 TABLET 01/08/2022 90   LISINOPRIL-HCTZ 20-25 MG TAB 02/03/2022 90   OMEPRAZOLE DR 20 MG CAPSULE 02/03/2022 90   Reviewed chart prior to disease state call. Spoke with patient regarding BP  Recent Office Vitals: BP Readings from Last 3 Encounters:  03/21/22 (!) 162/89  03/01/22 (!) 104/58  01/23/22 134/70   Pulse Readings from Last 3 Encounters:  03/21/22 (!) 102  03/01/22 (!) 116  01/23/22 97    Wt Readings from Last 3 Encounters:  03/21/22 117 lb (53.1 kg)  01/23/22 136 lb (61.7 kg)  01/05/22 136 lb 6.4 oz (61.9 kg)     Kidney Function Lab Results  Component Value Date/Time   CREATININE 0.78 01/05/2022 10:51 AM   CREATININE 0.78 07/21/2021 10:18 AM   CREATININE 0.78 06/21/2021 09:52 AM   GFR 71.25 01/05/2022 10:51 AM   GFRNONAA >60 07/21/2021 10:18 AM   GFRAA >60 11/24/2019 05:40 PM       Latest Ref Rng & Units 01/05/2022   10:51 AM 07/21/2021   10:18 AM 06/21/2021    9:52 AM  BMP  Glucose 70 -  99 mg/dL 101   93   95    BUN 6 - 23 mg/dL 18   15   11     Creatinine 0.40 - 1.20 mg/dL 0.78   0.78   0.78    Sodium 135 - 145 mEq/L 139   141   143    Potassium 3.5 - 5.1 mEq/L 3.9   4.0   3.9    Chloride 96 - 112 mEq/L 99   101   103    CO2 19 - 32 mEq/L 32   30   28    Calcium 8.4 - 10.5 mg/dL 10.0   10.2   10.4      Current antihypertensive regimen:  Lisinopril HCTZ 20/25 mg 1 tablet daily  How often are you checking your Blood Pressure? Patient states she is not checking often, maybe once per month. She was encouraged to take her blood pressure more often, at least  weekly to a couple times weekly and record the readings. Patient agrees  Current home BP readings: Patient doesn't recall her last reading.  What recent interventions/DTPs have been made by any provider to improve Blood Pressure control since last CPP Visit: No recent interventions.   Any recent hospitalizations or ED visits since last visit with CPP? No recent hospital visits.   What diet changes have been made to improve Blood Pressure Control?  Patient follows no specific type of diet Breakfast - patient will have none Lunch - patient will have a sandwich or leftovers and boost Dinner - patient will have a sandwich, leftovers or a meal containing a meat and vegetables.   What exercise is being done to improve your Blood Pressure Control?  Patient is sitting most of the day but does get up and will walk and will do light housework.   Adherence Review: Is the patient currently on ACE/ARB medication? Yes Does the patient have >5 day gap between last estimated fill dates? No  Care Gaps: AWV - scheduled 10/30/2022 Last BP - 162/89 on 03/21/2022 Last A1C - 5.4 on 01/05/2022 Shingrix - never done Tdap - postponed  Star Rating Drugs: Atorvastatin 10 mg - last filled 12/30/2021 90 [facility] Lisinopril HCTZ 20/25 mg  - last filled 02/03/2022 90 DS at [facility] Pharmacist Assistant [phone]

## 2022-03-23 DIAGNOSIS — L2089 Other atopic dermatitis: Secondary | ICD-10-CM | POA: Diagnosis not present

## 2022-03-31 ENCOUNTER — Other Ambulatory Visit: Payer: Self-pay | Admitting: Vascular Surgery

## 2022-04-04 ENCOUNTER — Other Ambulatory Visit: Payer: Self-pay | Admitting: Family Medicine

## 2022-04-04 DIAGNOSIS — K219 Gastro-esophageal reflux disease without esophagitis: Secondary | ICD-10-CM

## 2022-04-08 ENCOUNTER — Other Ambulatory Visit: Payer: Self-pay | Admitting: Family Medicine

## 2022-04-12 ENCOUNTER — Encounter (HOSPITAL_COMMUNITY): Payer: Self-pay

## 2022-04-12 ENCOUNTER — Other Ambulatory Visit: Payer: Self-pay

## 2022-04-12 ENCOUNTER — Emergency Department (HOSPITAL_COMMUNITY): Payer: Medicare Other

## 2022-04-12 ENCOUNTER — Inpatient Hospital Stay (HOSPITAL_COMMUNITY)
Admission: EM | Admit: 2022-04-12 | Discharge: 2022-04-22 | DRG: 640 | Disposition: A | Discharge status: Skilled Nursing Facility | Payer: Medicare Other | Attending: Internal Medicine | Admitting: Internal Medicine

## 2022-04-12 DIAGNOSIS — K5521 Angiodysplasia of colon with hemorrhage: Secondary | ICD-10-CM | POA: Diagnosis not present

## 2022-04-12 DIAGNOSIS — H269 Unspecified cataract: Secondary | ICD-10-CM | POA: Diagnosis not present

## 2022-04-12 DIAGNOSIS — M160 Bilateral primary osteoarthritis of hip: Secondary | ICD-10-CM | POA: Diagnosis not present

## 2022-04-12 DIAGNOSIS — D62 Acute posthemorrhagic anemia: Secondary | ICD-10-CM | POA: Diagnosis not present

## 2022-04-12 DIAGNOSIS — M2578 Osteophyte, vertebrae: Secondary | ICD-10-CM | POA: Diagnosis not present

## 2022-04-12 DIAGNOSIS — Z87891 Personal history of nicotine dependence: Secondary | ICD-10-CM

## 2022-04-12 DIAGNOSIS — D500 Iron deficiency anemia secondary to blood loss (chronic): Secondary | ICD-10-CM

## 2022-04-12 DIAGNOSIS — Z7901 Long term (current) use of anticoagulants: Secondary | ICD-10-CM

## 2022-04-12 DIAGNOSIS — K922 Gastrointestinal hemorrhage, unspecified: Secondary | ICD-10-CM

## 2022-04-12 DIAGNOSIS — G4700 Insomnia, unspecified: Secondary | ICD-10-CM | POA: Diagnosis not present

## 2022-04-12 DIAGNOSIS — R195 Other fecal abnormalities: Secondary | ICD-10-CM | POA: Diagnosis not present

## 2022-04-12 DIAGNOSIS — C3491 Malignant neoplasm of unspecified part of right bronchus or lung: Secondary | ICD-10-CM | POA: Diagnosis not present

## 2022-04-12 DIAGNOSIS — Z923 Personal history of irradiation: Secondary | ICD-10-CM

## 2022-04-12 DIAGNOSIS — Z515 Encounter for palliative care: Secondary | ICD-10-CM | POA: Diagnosis not present

## 2022-04-12 DIAGNOSIS — Z881 Allergy status to other antibiotic agents status: Secondary | ICD-10-CM | POA: Diagnosis not present

## 2022-04-12 DIAGNOSIS — E876 Hypokalemia: Secondary | ICD-10-CM | POA: Diagnosis not present

## 2022-04-12 DIAGNOSIS — E440 Moderate protein-calorie malnutrition: Secondary | ICD-10-CM | POA: Diagnosis not present

## 2022-04-12 DIAGNOSIS — Z66 Do not resuscitate: Secondary | ICD-10-CM | POA: Diagnosis not present

## 2022-04-12 DIAGNOSIS — K5520 Angiodysplasia of colon without hemorrhage: Secondary | ICD-10-CM | POA: Diagnosis not present

## 2022-04-12 DIAGNOSIS — R64 Cachexia: Secondary | ICD-10-CM | POA: Diagnosis present

## 2022-04-12 DIAGNOSIS — R531 Weakness: Secondary | ICD-10-CM

## 2022-04-12 DIAGNOSIS — Z8551 Personal history of malignant neoplasm of bladder: Secondary | ICD-10-CM

## 2022-04-12 DIAGNOSIS — M6281 Muscle weakness (generalized): Secondary | ICD-10-CM | POA: Diagnosis not present

## 2022-04-12 DIAGNOSIS — S199XXA Unspecified injury of neck, initial encounter: Secondary | ICD-10-CM | POA: Diagnosis not present

## 2022-04-12 DIAGNOSIS — D649 Anemia, unspecified: Secondary | ICD-10-CM

## 2022-04-12 DIAGNOSIS — I480 Paroxysmal atrial fibrillation: Secondary | ICD-10-CM | POA: Diagnosis present

## 2022-04-12 DIAGNOSIS — I82532 Chronic embolism and thrombosis of left popliteal vein: Secondary | ICD-10-CM | POA: Diagnosis not present

## 2022-04-12 DIAGNOSIS — Z86010 Personal history of colonic polyps: Secondary | ICD-10-CM | POA: Diagnosis not present

## 2022-04-12 DIAGNOSIS — R638 Other symptoms and signs concerning food and fluid intake: Secondary | ICD-10-CM | POA: Diagnosis not present

## 2022-04-12 DIAGNOSIS — Z85118 Personal history of other malignant neoplasm of bronchus and lung: Secondary | ICD-10-CM

## 2022-04-12 DIAGNOSIS — E860 Dehydration: Secondary | ICD-10-CM | POA: Diagnosis not present

## 2022-04-12 DIAGNOSIS — Z681 Body mass index (BMI) 19 or less, adult: Secondary | ICD-10-CM | POA: Diagnosis not present

## 2022-04-12 DIAGNOSIS — R000 Tachycardia, unspecified: Secondary | ICD-10-CM | POA: Diagnosis not present

## 2022-04-12 DIAGNOSIS — J309 Allergic rhinitis, unspecified: Secondary | ICD-10-CM | POA: Diagnosis not present

## 2022-04-12 DIAGNOSIS — Z86718 Personal history of other venous thrombosis and embolism: Secondary | ICD-10-CM

## 2022-04-12 DIAGNOSIS — Z743 Need for continuous supervision: Secondary | ICD-10-CM | POA: Diagnosis not present

## 2022-04-12 DIAGNOSIS — Z79899 Other long term (current) drug therapy: Secondary | ICD-10-CM

## 2022-04-12 DIAGNOSIS — M5136 Other intervertebral disc degeneration, lumbar region: Secondary | ICD-10-CM | POA: Diagnosis not present

## 2022-04-12 DIAGNOSIS — E785 Hyperlipidemia, unspecified: Secondary | ICD-10-CM | POA: Diagnosis not present

## 2022-04-12 DIAGNOSIS — R627 Adult failure to thrive: Secondary | ICD-10-CM | POA: Diagnosis present

## 2022-04-12 DIAGNOSIS — R112 Nausea with vomiting, unspecified: Secondary | ICD-10-CM | POA: Diagnosis not present

## 2022-04-12 DIAGNOSIS — Z7189 Other specified counseling: Secondary | ICD-10-CM | POA: Diagnosis not present

## 2022-04-12 DIAGNOSIS — R855 Abnormal microbiological findings in specimens from digestive organs and abdominal cavity: Secondary | ICD-10-CM | POA: Diagnosis not present

## 2022-04-12 DIAGNOSIS — I739 Peripheral vascular disease, unspecified: Secondary | ICD-10-CM | POA: Diagnosis not present

## 2022-04-12 DIAGNOSIS — R54 Age-related physical debility: Secondary | ICD-10-CM | POA: Diagnosis not present

## 2022-04-12 DIAGNOSIS — S0990XA Unspecified injury of head, initial encounter: Secondary | ICD-10-CM | POA: Diagnosis not present

## 2022-04-12 DIAGNOSIS — Z7401 Bed confinement status: Secondary | ICD-10-CM | POA: Diagnosis not present

## 2022-04-12 DIAGNOSIS — I700 Atherosclerosis of aorta: Secondary | ICD-10-CM | POA: Diagnosis not present

## 2022-04-12 DIAGNOSIS — R410 Disorientation, unspecified: Secondary | ICD-10-CM | POA: Diagnosis not present

## 2022-04-12 DIAGNOSIS — K264 Chronic or unspecified duodenal ulcer with hemorrhage: Secondary | ICD-10-CM | POA: Diagnosis not present

## 2022-04-12 DIAGNOSIS — D696 Thrombocytopenia, unspecified: Secondary | ICD-10-CM | POA: Diagnosis not present

## 2022-04-12 DIAGNOSIS — I10 Essential (primary) hypertension: Secondary | ICD-10-CM | POA: Diagnosis not present

## 2022-04-12 DIAGNOSIS — K5900 Constipation, unspecified: Secondary | ICD-10-CM | POA: Diagnosis not present

## 2022-04-12 DIAGNOSIS — K219 Gastro-esophageal reflux disease without esophagitis: Secondary | ICD-10-CM | POA: Diagnosis present

## 2022-04-12 DIAGNOSIS — M1990 Unspecified osteoarthritis, unspecified site: Secondary | ICD-10-CM | POA: Diagnosis present

## 2022-04-12 DIAGNOSIS — Z8619 Personal history of other infectious and parasitic diseases: Secondary | ICD-10-CM | POA: Diagnosis not present

## 2022-04-12 LAB — COMPREHENSIVE METABOLIC PANEL
ALT: 24 U/L (ref 0–44)
AST: 55 U/L — ABNORMAL HIGH (ref 15–41)
Albumin: 3.5 g/dL (ref 3.5–5.0)
Alkaline Phosphatase: 47 U/L (ref 38–126)
Anion gap: 14 (ref 5–15)
BUN: 15 mg/dL (ref 8–23)
CO2: 32 mmol/L (ref 22–32)
Calcium: 12.1 mg/dL — ABNORMAL HIGH (ref 8.9–10.3)
Chloride: 98 mmol/L (ref 98–111)
Creatinine, Ser: 0.75 mg/dL (ref 0.44–1.00)
GFR, Estimated: >60 mL/min (ref >60)
Glucose, Bld: 164 mg/dL — ABNORMAL HIGH (ref 70–99)
Potassium: 2.1 mmol/L — CL (ref 3.5–5.1)
Sodium: 144 mmol/L (ref 135–145)
Total Bilirubin: 1.6 mg/dL — ABNORMAL HIGH (ref 0.3–1.2)
Total Protein: 6.2 g/dL — ABNORMAL LOW (ref 6.5–8.1)

## 2022-04-12 LAB — URINALYSIS, ROUTINE W REFLEX MICROSCOPIC
Bilirubin Urine: NEGATIVE
Glucose, UA: NEGATIVE mg/dL
Ketones, ur: 5 mg/dL — AB
Leukocytes,Ua: NEGATIVE
Nitrite: NEGATIVE
Protein, ur: NEGATIVE mg/dL
Specific Gravity, Urine: 1.009 (ref 1.005–1.030)
pH: 8 (ref 5.0–8.0)

## 2022-04-12 LAB — BASIC METABOLIC PANEL
Anion gap: 10 (ref 5–15)
BUN: 13 mg/dL (ref 8–23)
CO2: 31 mmol/L (ref 22–32)
Calcium: 11.2 mg/dL — ABNORMAL HIGH (ref 8.9–10.3)
Chloride: 102 mmol/L (ref 98–111)
Creatinine, Ser: 0.52 mg/dL (ref 0.44–1.00)
GFR, Estimated: >60 mL/min (ref >60)
Glucose, Bld: 142 mg/dL — ABNORMAL HIGH (ref 70–99)
Potassium: 2.7 mmol/L — CL (ref 3.5–5.1)
Sodium: 143 mmol/L (ref 135–145)

## 2022-04-12 LAB — CBC WITH DIFFERENTIAL/PLATELET
Abs Immature Granulocytes: 0.23 10*3/uL — ABNORMAL HIGH (ref 0.00–0.07)
Basophils Absolute: 0 10*3/uL (ref 0.0–0.1)
Basophils Relative: 0 %
Eosinophils Absolute: 0 10*3/uL (ref 0.0–0.5)
Eosinophils Relative: 0 %
HCT: 26.5 % — ABNORMAL LOW (ref 36.0–46.0)
Hemoglobin: 7.9 g/dL — ABNORMAL LOW (ref 12.0–15.0)
Immature Granulocytes: 2 %
Lymphocytes Relative: 4 %
Lymphs Abs: 0.5 10*3/uL — ABNORMAL LOW (ref 0.7–4.0)
MCH: 27.2 pg (ref 26.0–34.0)
MCHC: 29.8 g/dL — ABNORMAL LOW (ref 30.0–36.0)
MCV: 91.4 fL (ref 80.0–100.0)
Monocytes Absolute: 0.4 10*3/uL (ref 0.1–1.0)
Monocytes Relative: 3 %
Neutro Abs: 12.5 10*3/uL — ABNORMAL HIGH (ref 1.7–7.7)
Neutrophils Relative %: 91 %
Platelets: 130 10*3/uL — ABNORMAL LOW (ref 150–400)
RBC: 2.9 MIL/uL — ABNORMAL LOW (ref 3.87–5.11)
RDW: 15.9 % — ABNORMAL HIGH (ref 11.5–15.5)
WBC: 13.6 10*3/uL — ABNORMAL HIGH (ref 4.0–10.5)
nRBC: 1 % — ABNORMAL HIGH (ref 0.0–0.2)

## 2022-04-12 LAB — HEMOGLOBIN AND HEMATOCRIT, BLOOD
HCT: 23.6 % — ABNORMAL LOW (ref 36.0–46.0)
Hemoglobin: 7.1 g/dL — ABNORMAL LOW (ref 12.0–15.0)

## 2022-04-12 LAB — MAGNESIUM
Magnesium: 1.3 mg/dL — ABNORMAL LOW (ref 1.7–2.4)
Magnesium: 1.5 mg/dL — ABNORMAL LOW (ref 1.7–2.4)

## 2022-04-12 LAB — RETICULOCYTES
Immature Retic Fract: 31.8 % — ABNORMAL HIGH (ref 2.3–15.9)
RBC.: 2.64 MIL/uL — ABNORMAL LOW (ref 3.87–5.11)
Retic Count, Absolute: 78.1 10*3/uL (ref 19.0–186.0)
Retic Ct Pct: 3 % (ref 0.4–3.1)

## 2022-04-12 LAB — FERRITIN: Ferritin: 62 ng/mL (ref 11–307)

## 2022-04-12 LAB — PHOSPHORUS
Phosphorus: <1 mg/dL — CL (ref 2.5–4.6)
Phosphorus: 1.8 mg/dL — ABNORMAL LOW (ref 2.5–4.6)

## 2022-04-12 LAB — IRON AND TIBC
Iron: 48 ug/dL (ref 28–170)
Saturation Ratios: 13 % (ref 10.4–31.8)
TIBC: 382 ug/dL (ref 250–450)
UIBC: 334 ug/dL

## 2022-04-12 LAB — TSH: TSH: 1.026 u[IU]/mL (ref 0.350–4.500)

## 2022-04-12 LAB — PROTIME-INR
INR: 1.5 — ABNORMAL HIGH (ref 0.8–1.2)
Prothrombin Time: 18.2 seconds — ABNORMAL HIGH (ref 11.4–15.2)

## 2022-04-12 LAB — CK: Total CK: 49 U/L (ref 38–234)

## 2022-04-12 LAB — POC OCCULT BLOOD, ED: Fecal Occult Bld: POSITIVE — AB

## 2022-04-12 LAB — APTT: aPTT: 23 seconds — ABNORMAL LOW (ref 24–36)

## 2022-04-12 LAB — ABO/RH: ABO/RH(D): A POS

## 2022-04-12 LAB — POTASSIUM: Potassium: 2.3 mmol/L — CL (ref 3.5–5.1)

## 2022-04-12 MED ORDER — LISINOPRIL 20 MG PO TABS
20.0000 mg | ORAL_TABLET | Freq: Every day | ORAL | Status: DC
  Administered 2022-04-12 – 2022-04-22 (×11): 20 mg via ORAL
  Filled 2022-04-12 (×11): qty 1

## 2022-04-12 MED ORDER — SODIUM CHLORIDE 0.9 % IV SOLN
INTRAVENOUS | Status: DC
Start: 2022-04-12 — End: 2022-04-12

## 2022-04-12 MED ORDER — ENOXAPARIN SODIUM 40 MG/0.4ML IJ SOSY
40.0000 mg | PREFILLED_SYRINGE | Freq: Every day | INTRAMUSCULAR | Status: DC
  Administered 2022-04-12: 40 mg via SUBCUTANEOUS
  Filled 2022-04-12: qty 0.4

## 2022-04-12 MED ORDER — DRONABINOL 2.5 MG PO CAPS
2.5000 mg | ORAL_CAPSULE | Freq: Two times a day (BID) | ORAL | Status: DC
  Administered 2022-04-12 – 2022-04-19 (×14): 2.5 mg via ORAL
  Filled 2022-04-12 (×14): qty 1

## 2022-04-12 MED ORDER — MAGNESIUM SULFATE IN D5W 1-5 GM/100ML-% IV SOLN
1.0000 g | Freq: Once | INTRAVENOUS | Status: AC
Start: 2022-04-12 — End: 2022-04-13
  Administered 2022-04-12: 1 g via INTRAVENOUS
  Filled 2022-04-12: qty 100

## 2022-04-12 MED ORDER — MAGNESIUM SULFATE 2 GM/50ML IV SOLN
2.0000 g | Freq: Once | INTRAVENOUS | Status: AC
  Administered 2022-04-12: 2 g via INTRAVENOUS
  Filled 2022-04-12: qty 50

## 2022-04-12 MED ORDER — ENOXAPARIN SODIUM 40 MG/0.4ML IJ SOSY: 40.0000 mg | PREFILLED_SYRINGE | Freq: Every day | INTRAMUSCULAR | Status: DC

## 2022-04-12 MED ORDER — ENSURE ENLIVE PO LIQD
237.0000 mL | Freq: Two times a day (BID) | ORAL | Status: DC
  Administered 2022-04-14 – 2022-04-19 (×10): 237 mL via ORAL
  Filled 2022-04-12: qty 237

## 2022-04-12 MED ORDER — POTASSIUM CHLORIDE CRYS ER 20 MEQ PO TBCR
40.0000 meq | EXTENDED_RELEASE_TABLET | ORAL | Status: AC
  Administered 2022-04-12: 40 meq via ORAL
  Filled 2022-04-12: qty 2

## 2022-04-12 MED ORDER — POTASSIUM CHLORIDE 10 MEQ/100ML IV SOLN
10.0000 meq | INTRAVENOUS | Status: AC
  Administered 2022-04-12 (×2): 10 meq via INTRAVENOUS
  Filled 2022-04-12 (×2): qty 100

## 2022-04-12 MED ORDER — ACETAMINOPHEN 500 MG PO TABS
1000.0000 mg | ORAL_TABLET | Freq: Two times a day (BID) | ORAL | Status: DC | PRN
Start: 2022-04-12 — End: 2022-04-22
  Administered 2022-04-14: 1000 mg via ORAL
  Filled 2022-04-12: qty 2

## 2022-04-12 MED ORDER — SODIUM CHLORIDE 0.9 % IV SOLN
Freq: Once | INTRAVENOUS | Status: AC
  Filled 2022-04-12: qty 1000

## 2022-04-12 MED ORDER — POTASSIUM CHLORIDE 10 MEQ/100ML IV SOLN
10.0000 meq | INTRAVENOUS | Status: AC
  Administered 2022-04-12 (×4): 10 meq via INTRAVENOUS
  Filled 2022-04-12 (×3): qty 100

## 2022-04-12 MED ORDER — POTASSIUM CHLORIDE CRYS ER 20 MEQ PO TBCR
40.0000 meq | EXTENDED_RELEASE_TABLET | ORAL | Status: AC
  Administered 2022-04-12 – 2022-04-13 (×2): 40 meq via ORAL
  Filled 2022-04-12 (×2): qty 2

## 2022-04-12 MED ORDER — MELATONIN 5 MG PO TABS
5.0000 mg | ORAL_TABLET | Freq: Every day | ORAL | Status: DC
  Administered 2022-04-12 – 2022-04-17 (×6): 5 mg via ORAL
  Filled 2022-04-12 (×6): qty 1

## 2022-04-12 MED ORDER — PANTOPRAZOLE SODIUM 40 MG PO TBEC
40.0000 mg | DELAYED_RELEASE_TABLET | Freq: Every day | ORAL | Status: DC
  Filled 2022-04-12: qty 1

## 2022-04-12 MED ORDER — LISINOPRIL-HYDROCHLOROTHIAZIDE 20-25 MG PO TABS: 1.0000 | ORAL_TABLET | Freq: Every day | ORAL | Status: DC

## 2022-04-12 MED ORDER — HYDROCHLOROTHIAZIDE 25 MG PO TABS
25.0000 mg | ORAL_TABLET | Freq: Every day | ORAL | Status: DC
Start: 2022-04-12 — End: 2022-04-13
  Administered 2022-04-12 – 2022-04-13 (×2): 25 mg via ORAL
  Filled 2022-04-12 (×2): qty 1

## 2022-04-12 MED ORDER — ATORVASTATIN CALCIUM 10 MG PO TABS
10.0000 mg | ORAL_TABLET | Freq: Every day | ORAL | Status: DC
  Administered 2022-04-12 – 2022-04-22 (×11): 10 mg via ORAL
  Filled 2022-04-12 (×11): qty 1

## 2022-04-12 NOTE — H&P (Signed)
History and Physical    Tracy Davidson UXN:[id number] DOB: 30-Jan-1941 DOA: 04/12/2022  PCP: Billie Ruddy, MD (Confirm with patient/family/NH records and if not entered, this has to be entered at [facility] point of entry) Patient coming from: Home  I have personally briefly reviewed patient's old medical records in [place]  Chief Complaint: Feeling weak  HPI: Tracy Davidson is a 81 y.o. 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  Her symptoms started about 2 weeks ago, patient patient lost appetite.  Since then, patient has been only on Ensure 2-3 times a day.  She denied any feeling of nauseous vomiting abdominal pain, she does have occasional diarrhea but she described as mild and loose.  No fever or chills.  Also denied any trouble swallowing, no choking or cough after eating or drinking.  She estimated herself "lost a few pounds in last weeks".  This week, she felt extremely weak and could not even stand up from her chair, she denied any one-sided weakness or numbness.  No fall.  She denied any blood in the stool or dark-colored stool.   ED Course: Borderline tachycardia no hypotension.  Blood work, hemoglobin 7.9 compared to 11.4 several months ago, K2.1, creatinine 0.75, calcium 12  Review of Systems: As per HPI otherwise 10 point review of systems negative.    Past Medical History:  Diagnosis Date   ALLERGIC RHINITIS 09/20/2009   Allergy    Arthritis    LEGS and left arm   Bladder tumor    Cataract    both eyes, right worse thsn left   COLONIC POLYPS, HX OF 06/06/2007   DYSLIPIDEMIA 09/20/2009   Dyspnea    sob with activity   GERD (gastroesophageal reflux disease)    History of radiation therapy    Right lung- 09/01/21-09/16/21- Dr. [name]   HYPERTENSION 06/06/2007   Knee pain    stiff and pain on side   LIVER FUNCTION  TESTS, ABNORMAL, HX OF    feb or march 2019   Peripheral vascular disease ([place])    Shingles 11/2019   left arm and shoulder    Past Surgical History:  Procedure Laterality Date   ABDOMINAL AORTOGRAM W/LOWER EXTREMITY Bilateral 03/03/2020   Procedure: ABDOMINAL AORTOGRAM W/LOWER EXTREMITY;  Surgeon: Marty Heck, MD;  Location: [place] [facility];  Service: Cardiovascular;  Laterality: Bilateral;   BREAST BIOPSY Left 02/02/2021   BREAST EXCISIONAL BIOPSY Left 1975   BRONCHIAL BIOPSY  06/14/2021   Procedure: BRONCHIAL BIOPSIES;  Surgeon: Garner Nash, DO;  Location: [facility];  Service: Pulmonary;;   BRONCHIAL BRUSHINGS  06/14/2021   Procedure: BRONCHIAL BRUSHINGS;  Surgeon: Garner Nash, DO;  Location: [facility] [facility];  Service: Pulmonary;;   BRONCHIAL NEEDLE ASPIRATION BIOPSY  06/14/2021   Procedure: BRONCHIAL NEEDLE ASPIRATION BIOPSIES;  Surgeon: Garner Nash, DO;  Location: [place];  Service: Pulmonary;;   COLONOSCOPY  ?2015   CRYOTHERAPY  06/14/2021   Procedure: CRYOTHERAPY;  Surgeon: Garner Nash, DO;  Location: [place] [facility];  Service: Pulmonary;;   CYSTOSCOPY WITH BIOPSY N/A 04/12/2020   Procedure: CYSTOSCOPY WITH BIOPSY  INSTILLATION OF GEMCITABINE;  Surgeon: Lucas Mallow, MD;  Location: [place];  Service: Urology;  Laterality: N/A;   DILATION AND CURETTAGE OF UTERUS  yrs ago   HEMOSTASIS CONTROL  06/14/2021   Procedure: HEMOSTASIS CONTROL;  Surgeon: Garner Nash, DO;  Location: [facility];  Service: Pulmonary;;   PERIPHERAL VASCULAR INTERVENTION Right 03/03/2020   Procedure: PERIPHERAL VASCULAR INTERVENTION;  Surgeon: Marty Heck, MD;  Location: [place];  Service: Cardiovascular;  Laterality: Right;   RADIOACTIVE SEED GUIDED EXCISIONAL BREAST BIOPSY Left 02/02/2021   Procedure: RADIOACTIVE SEED GUIDED EXCISIONAL LEFT BREAST BIOPSY;  Surgeon: Stark Klein, MD;  Location: [place];   Service: General;  Laterality: Left;   [place]  yrs ago   complete   TRANSURETHRAL RESECTION OF BLADDER TUMOR N/A 09/18/2018   Procedure: RESTAGE TRANSURETHRAL RESECTION OF BLADDER TUMOR (TURBT);  Surgeon: Lucas Mallow, MD;  Location[facility];  Service: Urology;  Laterality: N/A;   TRANSURETHRAL RESECTION OF BLADDER TUMOR WITH MITOMYCIN-C N/A 08/16/2018   Procedure: TRANSURETHRAL RESECTION OF BLADDER TUMOR;  Surgeon: Lucas Mallow, MD;  Location: [facility]  Service: Urology;  Laterality: N/A;   VIDEO BRONCHOSCOPY WITH ENDOBRONCHIAL ULTRASOUND N/A 06/14/2021   Procedure: VIDEO BRONCHOSCOPY WITH ENDOBRONCHIAL ULTRASOUND;  Surgeon: Garner Nash, DO;  Location: [place];  Service: Pulmonary;  Laterality: N/A;     reports that she has quit smoking. Her smoking use included cigarettes. She started smoking about 57 years ago. She has a 50.00 pack-year smoking history. She has never used smokeless tobacco. She reports that she does not currently use alcohol after a past usage of about 2.0 standard drinks of alcohol per week. She reports that she does not use drugs.  Allergies  Allergen Reactions   Vibra-Tab [Doxycycline] Palpitations    Family History  Problem Relation Age of Onset   Colon cancer Neg Hx    Colon polyps Neg Hx    Esophageal cancer Neg Hx    Rectal cancer Neg Hx    Stomach cancer Neg Hx    Pancreatic cancer Neg Hx    Breast cancer Neg Hx      Prior to Admission medications   Medication Sig Start Date End Date Taking? Authorizing Provider  acetaminophen (TYLENOL) 500 MG tablet Take 1,000 mg by mouth 2 (two) times daily as needed for mild pain.   Yes [provider]  atorvastatin (LIPITOR) 10 MG tablet TAKE 1 TABLET BY MOUTH EVERY DAY Patient taking differently: Take 10 mg by mouth daily. 03/31/22  Yes Waynetta Sandy, MD  CALCIUM PO Take 1 tablet by mouth  daily.   Yes [provider]  Cholecalciferol (VITAMIN D-3 PO) Take 1 capsule by mouth daily.   Yes [provider]  ELIQUIS 5 MG TABS tablet TAKE 1 TABLET BY MOUTH TWICE A DAY Patient taking differently: Take 5 mg by mouth 2 (two) times daily. 12/19/21  Yes Billie Ruddy, MD  KLOR-CON M20 20 MEQ tablet TAKE 1 TABLET BY MOUTH EVERY DAY Patient taking differently: 20 mEq daily. 04/10/22  Yes Billie Ruddy, MD  lisinopril-hydrochlorothiazide (ZESTORETIC) 20-25 MG tablet TAKE 1 TABLET BY MOUTH EVERY DAY Patient taking differently: Take 1 tablet by mouth daily. 02/03/22  Yes Billie Ruddy, MD  MELATONIN PO Take 1 tablet by mouth daily.   Yes [provider]  omeprazole (PRILOSEC) 20 MG capsule TAKE 1 CAPSULE BY MOUTH EVERY DAY Patient taking differently: Take 20 mg by mouth daily. 04/04/22  Yes Billie Ruddy, MD  triamcinolone ointment (KENALOG) 0.1 % Apply 1 application  topically 2 (two) times daily as needed (itching).  Yes [provider]    Physical Exam: Vitals:   04/12/22 0930 04/12/22 0945 04/12/22 1017 04/12/22 1130  BP: 133/75 (!) 145/70  (!) 165/86  Pulse: (!) 104 (!) 101  (!) 105  Resp: (!) 22 (!) 21  (!) 23  Temp:   98 F (36.7 C)   TempSrc:   Oral   SpO2: 100% 100%  100%    Constitutional: NAD, calm, comfortable Vitals:   04/12/22 0930 04/12/22 0945 04/12/22 1017 04/12/22 1130  BP: 133/75 (!) 145/70  (!) 165/86  Pulse: (!) 104 (!) 101  (!) 105  Resp: (!) 22 (!) 21  (!) 23  Temp:   98 F (36.7 C)   TempSrc:   Oral   SpO2: 100% 100%  100%   Eyes: PERRL, lids and conjunctivae normal ENMT: Mucous membranes are dry. Posterior pharynx clear of any exudate or lesions.Normal dentition.  Neck: normal, supple, no masses, no thyromegaly Respiratory: clear to auscultation bilaterally, no wheezing, no crackles. Normal respiratory effort. No accessory muscle use.  Cardiovascular: Regular rate and rhythm, no murmurs / rubs / gallops. No  extremity edema. 2+ pedal pulses. No carotid bruits.  Abdomen: no tenderness, no masses palpated. No hepatosplenomegaly. Bowel sounds positive.  Musculoskeletal: no clubbing / cyanosis. No joint deformity upper and lower extremities. Good ROM, no contractures. Normal muscle tone.  Skin: no rashes, lesions, ulcers. No induration Neurologic: CN 2-12 grossly intact. Sensation intact, DTR normal. Strength 5/5 in all 4.  Psychiatric: Normal judgment and insight. Alert and oriented x 3. Normal mood.     Labs on Admission: I have personally reviewed following labs and imaging studies  CBC: Recent Labs  Lab 04/12/22 0918  WBC 13.6*  NEUTROABS 12.5*  HGB 7.9*  HCT 26.5*  MCV 91.4  PLT 160*   Basic Metabolic Panel: Recent Labs  Lab 04/12/22 0918  NA 144  K 2.1*  CL 98  CO2 32  GLUCOSE 164*  BUN 15  CREATININE 0.75  CALCIUM 12.1*  MG 1.3*   GFR: CrCl cannot be calculated (Unknown ideal weight.). Liver Function Tests: Recent Labs  Lab 04/12/22 0918  AST 55*  ALT 24  ALKPHOS 47  BILITOT 1.6*  PROT 6.2*  ALBUMIN 3.5   No results for input(s): "LIPASE", "AMYLASE" in the last 168 hours. No results for input(s): "AMMONIA" in the last 168 hours. Coagulation Profile: Recent Labs  Lab 04/12/22 1112  INR 1.5*   Cardiac Enzymes: Recent Labs  Lab 04/12/22 0918  CKTOTAL 49   BNP (last 3 results) No results for input(s): "PROBNP" in the last 8760 hours. HbA1C: No results for input(s): "HGBA1C" in the last 72 hours. CBG: No results for input(s): "GLUCAP" in the last 168 hours. Lipid Profile: No results for input(s): "CHOL", "HDL", "LDLCALC", "TRIG", "CHOLHDL", "LDLDIRECT" in the last 72 hours. Thyroid Function Tests: No results for input(s): "TSH", "T4TOTAL", "FREET4", "T3FREE", "THYROIDAB" in the last 72 hours. Anemia Panel: No results for input(s): "VITAMINB12", "FOLATE", "FERRITIN", "TIBC", "IRON", "RETICCTPCT" in the last 72 hours. Urine analysis:    Component  Value Date/Time   BILIRUBINUR N 10/24/2017 1232   PROTEINUR trace 10/24/2017 1232   UROBILINOGEN 0.2 10/24/2017 1232   NITRITE N 10/24/2017 1232   LEUKOCYTESUR Negative 10/24/2017 1232    Radiological Exams on Admission: CT HEAD WO CONTRAST (5MM)  Result Date: 04/12/2022 CLINICAL DATA:  Head trauma, minor (Age >= 65y); Neck trauma (Age >= 65y) EXAM: CT HEAD WITHOUT CONTRAST CT CERVICAL SPINE WITHOUT CONTRAST TECHNIQUE:  Multidetector CT imaging of the head and cervical spine was performed following the standard protocol without intravenous contrast. Multiplanar CT image reconstructions of the cervical spine were also generated. RADIATION DOSE REDUCTION: This exam was performed according to the departmental dose-optimization program which includes automated exposure control, adjustment of the mA and/or kV according to patient size and/or use of iterative reconstruction technique. COMPARISON:  None Available. FINDINGS: CT HEAD FINDINGS Brain: No evidence of acute infarction, hemorrhage, hydrocephalus, extra-axial collection or mass lesion/mass effect. Patchy white matter hypoattenuation nonspecific but compatible with chronic microvascular ischemic disease. Vascular: Calcific intracranial atherosclerosis. Skull: No acute fracture. Sinuses/Orbits: Clear sinuses.  No acute orbital findings. Other: No mastoid effusions. CT CERVICAL SPINE FINDINGS Alignment: No substantial sagittal subluxation. Skull base and vertebrae: Vertebral body heights are maintained. No evidence of acute fracture. Soft tissues and spinal canal: No prevertebral fluid or swelling. No visible canal hematoma. Disc levels: Multilevel facet and uncovertebral hypertrophy contributes to varying degrees of neural foraminal stenosis. Bridging osteophytes and endplate spurring at multiple levels. Upper chest: Visualized lung apices are clear. IMPRESSION: 1. No evidence of acute intracranial abnormality. 2. No evidence of acute fracture or traumatic  malalignment in the cervical spine. Electronically Signed   By: Margaretha Sheffield M.D.   On: 04/12/2022 10:44   CT Cervical Spine Wo Contrast  Result Date: 04/12/2022 CLINICAL DATA:  Head trauma, minor (Age >= 65y); Neck trauma (Age >= 65y) EXAM: CT HEAD WITHOUT CONTRAST CT CERVICAL SPINE WITHOUT CONTRAST TECHNIQUE: Multidetector CT imaging of the head and cervical spine was performed following the standard protocol without intravenous contrast. Multiplanar CT image reconstructions of the cervical spine were also generated. RADIATION DOSE REDUCTION: This exam was performed according to the departmental dose-optimization program which includes automated exposure control, adjustment of the mA and/or kV according to patient size and/or use of iterative reconstruction technique. COMPARISON:  None Available. FINDINGS: CT HEAD FINDINGS Brain: No evidence of acute infarction, hemorrhage, hydrocephalus, extra-axial collection or mass lesion/mass effect. Patchy white matter hypoattenuation nonspecific but compatible with chronic microvascular ischemic disease. Vascular: Calcific intracranial atherosclerosis. Skull: No acute fracture. Sinuses/Orbits: Clear sinuses.  No acute orbital findings. Other: No mastoid effusions. CT CERVICAL SPINE FINDINGS Alignment: No substantial sagittal subluxation. Skull base and vertebrae: Vertebral body heights are maintained. No evidence of acute fracture. Soft tissues and spinal canal: No prevertebral fluid or swelling. No visible canal hematoma. Disc levels: Multilevel facet and uncovertebral hypertrophy contributes to varying degrees of neural foraminal stenosis. Bridging osteophytes and endplate spurring at multiple levels. Upper chest: Visualized lung apices are clear. IMPRESSION: 1. No evidence of acute intracranial abnormality. 2. No evidence of acute fracture or traumatic malalignment in the cervical spine. Electronically Signed   By: Margaretha Sheffield M.D.   On: 04/12/2022 10:44    DG Pelvis Portable  Result Date: 04/12/2022 CLINICAL DATA:  Fall and weakness. EXAM: PORTABLE PELVIS 1-2 VIEWS COMPARISON:  Lumbar spine 03/01/2022 FINDINGS: Pelvic bony ring is intact. Degenerative changes in the lower lumbar spine. Again noted is a right iliac vascular stent. Large amount stool in the rectum. Degenerative changes along the lateral aspect of the acetabula bilaterally. No gross abnormality to either hip. IMPRESSION: No acute bone abnormality in the pelvis. Electronically Signed   By: Markus Daft M.D.   On: 04/12/2022 10:28   DG Chest Port 1 View  Result Date: 04/12/2022 CLINICAL DATA:  Fall and weakness. EXAM: PORTABLE CHEST 1 VIEW COMPARISON:  Chest radiograph 11/24/2019 FINDINGS: Single view of the chest demonstrates  clear lungs. Large bridging osteophytes in the thoracic spine. Heart and mediastinum are within normal limits. Negative for a pneumothorax. Trachea is midline. IMPRESSION: No active disease. Electronically Signed   By: Markus Daft M.D.   On: 04/12/2022 10:26    EKG: Independently reviewed.  Sinus tachycardia, no significant T wave flattening.  Assessment/Plan Principal Problem:   Hypokalemia Active Problems:   Symptomatic anemia  (please populate well all problems here in Problem List. (For example, if patient is on BP meds at home and you resume or decide to hold them, it is a problem that needs to be her. Same for CAD, COPD, HLD and so on)  Severe hypokalemia -Likely secondary to poor oral intake, with concurrent severe hypomagnesemia. -Replenish K and magnesium. -Recheck K level tonight and continue IV and p.o. replenishment.  Severe dehydration -Clinically, patient has symptoms and signs of volume contraction probably from poor oral intake.  Start maintenance IV fluid, given the likelihood of poor oral vitamin intake, decided to use an ice with multivitamin.  Acute on chronic normocytic anemia -No history of GI bleed but patient does take Eliquis  chronically -Stool appeared to be brown-colored in the ED.  [facility] consulted, will keep patient n.p.o. after midnight -Hold Eliquis -Recheck H&H tonight and tomorrow morning, send iron study  Hypercalcemia -Suspect this is more likely related to dehydration, hydration as above and then reevaluate tomorrow.  Question of dysphagia -Nurse at ED reported that patient appeared to have hard time swallowing pills, given the history of lung cancer and radiation, consult speech evaluation  Leukocytosis -No cough, no diarrhea no dysuria, chest x-ray clear, UA pending.  Monitor off antibiotics for now repeat CBC tomorrow.  Severe protein calorie malnutrition -Has had anorexia, no clear etiology.  Expect GI will perform EGD to rule out any organic reasons.  Speech evaluation underway to rule out any dysphagia. -Trial of Marinol  Right-sided lung CA -Status post radiation therapy, repeat CT scan 2 months ago showed shrinking size of the tumor.  Outpatient follow-up with oncology and CT surgery for elective lobectomy.  PAF -Hold Eliquis, given there is no significant symptoms or signs of active GI bleed, will start patient on chemical DVT prophylaxis.  HTN -Continue HCTZ and lisinopril.  DVT prophylaxis: Lovenox Code Status: Full code Family Communication: None at bedside Disposition Plan: Patient is sick with symptomatic anemia and severe hypokalemia, expect GI work-up, expect more than 2 midnight hospital stay. Consults called: [facility] Admission status: Telemetry admission   Lequita Halt MD [facility] Pager [phone]  04/12/2022, 1:05 PM

## 2022-04-12 NOTE — Evaluation (Signed)
Clinical/Bedside Swallow Evaluation Patient Details  Name: CERA RORKE MRN: [id number] Date of Birth: [birth year]  Today's Date: 04/12/2022 Time: SLP Start Time (ACUTE ONLY): 0254 SLP Stop Time (ACUTE ONLY): 2706 SLP Time Calculation (min) (ACUTE ONLY): 13 min  Past Medical History:  Past Medical History:  Diagnosis Date   ALLERGIC RHINITIS 09/20/2009   Arthritis    LEGS and left arm   Bladder tumor    Cataract    both eyes, right worse thsn left   COLONIC POLYPS, HX OF 06/06/2007   DYSLIPIDEMIA 09/20/2009   Dyspnea    sob with activity   GERD (gastroesophageal reflux disease)    History of radiation therapy    Right lung- 09/01/21-09/16/21- Dr. [name]   HYPERTENSION 06/06/2007   Knee pain    stiff and pain on side   LIVER FUNCTION TESTS, ABNORMAL, HX OF    feb or march 2019   Peripheral vascular disease ([place])    Shingles 11/2019   left arm and shoulder   Past Surgical History:  Past Surgical History:  Procedure Laterality Date   ABDOMINAL AORTOGRAM W/LOWER EXTREMITY Bilateral 03/03/2020   Procedure: ABDOMINAL AORTOGRAM W/LOWER EXTREMITY;  Surgeon: Marty Heck, MD;  Location: [facility];  Service: Cardiovascular;  Laterality: Bilateral;   BREAST BIOPSY Left 02/02/2021   BREAST EXCISIONAL BIOPSY Left 1975   BRONCHIAL BIOPSY  06/14/2021   Procedure: BRONCHIAL BIOPSIES;  Surgeon: Garner Nash, DO;  Location: [facility];  Service: Pulmonary;;   BRONCHIAL BRUSHINGS  06/14/2021   Procedure: BRONCHIAL BRUSHINGS;  Surgeon: Garner Nash, DO;  Location: [facility];  Service: Pulmonary;;   BRONCHIAL NEEDLE ASPIRATION BIOPSY  06/14/2021   Procedure: BRONCHIAL NEEDLE ASPIRATION BIOPSIES;  Surgeon: Garner Nash, DO;  Location: [facility];  Service: Pulmonary;;   COLONOSCOPY  ?2015   CRYOTHERAPY  06/14/2021   Procedure: CRYOTHERAPY;  Surgeon: Garner Nash, DO;  Location: [facility];  Service: Pulmonary;;   CYSTOSCOPY WITH BIOPSY  N/A 04/12/2020   Procedure: CYSTOSCOPY WITH BIOPSY  INSTILLATION OF GEMCITABINE;  Surgeon: Lucas Mallow, MD;  Location: [facility];  Service: Urology;  Laterality: N/A;   DILATION AND CURETTAGE OF UTERUS  yrs ago   HEMOSTASIS CONTROL  06/14/2021   Procedure: HEMOSTASIS CONTROL;  Surgeon: Garner Nash, DO;  Location: [facility];  Service: Pulmonary;;   PERIPHERAL VASCULAR INTERVENTION Right 03/03/2020   Procedure: PERIPHERAL VASCULAR INTERVENTION;  Surgeon: Marty Heck, MD;  Location: [facility];  Service: Cardiovascular;  Laterality: Right;   RADIOACTIVE SEED GUIDED EXCISIONAL BREAST BIOPSY Left 02/02/2021   Procedure: RADIOACTIVE SEED GUIDED EXCISIONAL LEFT BREAST BIOPSY;  Surgeon: Stark Klein, MD;  Location: [facility];  Service: General;  Laterality: Left;   [place]  yrs ago   complete   TRANSURETHRAL RESECTION OF BLADDER TUMOR N/A 09/18/2018   Procedure: RESTAGE TRANSURETHRAL RESECTION OF BLADDER TUMOR (TURBT);  Surgeon: Lucas Mallow, MD;  Location: [facility];  Service: Urology;  Laterality: N/A;   TRANSURETHRAL RESECTION OF BLADDER TUMOR WITH MITOMYCIN-C N/A 08/16/2018   Procedure: TRANSURETHRAL RESECTION OF BLADDER TUMOR;  Surgeon: Lucas Mallow, MD;  Location[facility];  Service: Urology;  Laterality: N/A;   VIDEO BRONCHOSCOPY WITH ENDOBRONCHIAL ULTRASOUND N/A 06/14/2021   Procedure: VIDEO BRONCHOSCOPY WITH ENDOBRONCHIAL ULTRASOUND;  Surgeon: Garner Nash, DO;  Location: [facility];  Service: Pulmonary;  Laterality: N/A;  HPI:  Pt is an 81 y.o. female who presented with worsening of generalized weakness. SLP consulted after ED RN noted pt having a "hard time" swallowing pills. CXR and Ct head negative for acute changes. GI consulted due to anemia; plan for EGD on 6/29 and rx clear liquids for 6/28, full liquid diet ordered by  attending. PMH: right-sided lung cancer status post radiation therapy 2022, HTN, PAF on Eliquis, PAD.    Assessment / Plan / Recommendation  Clinical Impression  Pt was seen in the ED for bedside swallow evaluation and she denied a history of dysphagia prior to today. Oral mechanism exam was [facility] and she presented with reduced, but adequate dentition. The evaluation was limited to liquids per GI's recommendations. Pt demonstrated symptoms of oropharyngeal dysphagia characterized by inconsistent oral holding, multiple swallows with thin liquids, and intermittent throat clearing with consecutive swallows of thin liquids. Liquids may be continued at this time with observance of swallowing precautions. SLP will follow for further assessment and for possible instrumental assessment. SLP Visit Diagnosis: Dysphagia, unspecified (R13.10)    Aspiration Risk       Diet Recommendation Thin liquid (continue clear/full liquids per MD's preference)   Liquid Administration via: Cup;Straw Medication Administration: Whole meds with puree (crush if possible) Supervision: Patient able to self feed Compensations: Slow rate;Minimize environmental distractions;Small sips/bites (avoid consecutive swallows) Postural Changes: Seated upright at 90 degrees    Other  Recommendations Oral Care Recommendations: Oral care BID    Recommendations for follow up therapy are one component of a multi-disciplinary discharge planning process, led by the attending physician.  Recommendations may be updated based on patient status, additional functional criteria and insurance authorization.  Follow up Recommendations  (TBD)      Assistance Recommended at Discharge    Functional Status Assessment    Frequency and Duration min 2x/week  1 week       Prognosis Prognosis for Safe Diet Advancement: Good      Swallow Study   General Date of Onset: 04/12/22 HPI: Pt is an 81 y.o. female who presented with worsening of  generalized weakness. SLP consulted after ED RN noted pt having a "hard time" swallowing pills. CXR and Ct head negative for acute changes. GI consulted due to anemia; plan for EGD on 6/29 and rx clear liquids for 6/28, full liquid diet ordered by attending. PMH: right-sided lung cancer status post radiation therapy 2022, HTN, PAF on Eliquis, PAD. Type of Study: Bedside Swallow Evaluation Previous Swallow Assessment: none Diet Prior to this Study: Thin liquids (full liquids) Temperature Spikes Noted: No Respiratory Status: Room air History of Recent Intubation: No Behavior/Cognition: Alert;Cooperative;Pleasant mood Oral Cavity Assessment: Within Functional Limits Oral Care Completed by SLP: No Oral Cavity - Dentition: Adequate natural dentition Vision: Functional for self-feeding Self-Feeding Abilities: Needs assist Patient Positioning: Upright in bed;Postural control adequate for testing Baseline Vocal Quality: Normal Volitional Cough: Strong Volitional Swallow: Able to elicit    Oral/Motor/Sensory Function Overall Oral Motor/Sensory Function: Within functional limits   Ice Chips Ice chips: Within functional limits Presentation: Spoon   Thin Liquid Thin Liquid: Impaired Presentation: Straw;Cup Oral Phase Functional Implications: Oral holding Pharyngeal  Phase Impairments: Multiple swallows;Throat Clearing - Delayed (with consecutive swallows)    Nectar Thick Nectar Thick Liquid: Not tested   Honey Thick Honey Thick Liquid: Not tested   Puree Puree: Within functional limits Presentation: Spoon   Solid     Solid: Not tested     [place] I. Hardin Negus, [place], [place]  [place] Office number [phone] Pager [place] 04/12/2022,4:11 PM

## 2022-04-12 NOTE — H&P (View-Only) (Signed)
[facility] Consult: 2:36 PM 04/12/2022  LOS: 0 days    Referring Provider: DR Roosevelt Locks  Primary Care Physician:  Billie Ruddy, MD Primary Gastroenterologist:  Dr. Carlean Purl  Oncologist: Dr. Earlie Server, Dr Sondra Come.      Reason for Consultation:  Anemia.     HPI: Tracy Davidson is a 81 y.o. female.  Hx a fib.  Lower extremity blood clot.  On eliquis.  PAD, right external iliac artery stent 2021.  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  Pt declined surgical resection.  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  Resection of urinary bladder tumor, carcinoma in situ 2019.  Thrombocytopenia, intermittent dating back  to 2012.  Elevated LFTs remotely, hepatic cysts on 2010 ultrasound.  Adenomatous colon polyps in 2005, 2011, 2019. 12/2017 colonoscopy.  For polyp surveillance.  Dr. Carlean Purl removed 3 polyps, one was 8 mm pedunculated, the other 2 sessile polyps were diminutive.  Path: TAs.  Nonbleeding cecal and ascending AVMs.  No EGD in records.    In ED as of this morning for evaluation of generalized weakness.  Anorexia, declined p.o. intake starting 2 weeks ago.  Consuming 2-3 servings of Ensure daily.  Feels like she has lost weight.  Occasional loose, nonbloody diarrhea but no abdominal pain, nausea or vomiting.  No blood in her stool.  No dysphagia or choking with PO.  Is gotten to the point where she is so weak she cannot get up from her chair.  Had a fall a couple of days ago but no pain afterwards. Last Eliquis was 6/27 PM.     In ED Hb 7.9, it was 11.4 three months ago.  MCV 91.  Platelets 130 K T. bili 1.6, direct bilirubin normal 0.3.  Alk phos 47,  normal.  AST 55, ALT 24.  BUN normal. BUN/creatinine normal.  Hypokalemia at 2.1. TSH normal.   CK total normal. Head and C-spine CT unremarkable. Stool heme positive  No family history of colorectal or gastric cancers.  Pancreatic or liver disease. No alcohol.  Pulmonary note of mid March 2023 noted she was still smoking. Lives alone.  Does not have local family support but relies on friends for support and transportation to medical appointments.  Past Medical History:  Diagnosis Date   ALLERGIC RHINITIS 09/20/2009   Arthritis    LEGS and left arm   Bladder tumor    Cataract    both eyes, right worse thsn left   COLONIC POLYPS, HX OF 06/06/2007   DYSLIPIDEMIA 09/20/2009   Dyspnea    sob with activity   GERD (gastroesophageal reflux disease)    History of radiation therapy    Right lung- 09/01/21-09/16/21- Dr. [name]   HYPERTENSION 06/06/2007   Knee pain    stiff and pain on side   LIVER FUNCTION TESTS, ABNORMAL, HX OF    feb or march 2019   Peripheral vascular disease ([place])    Shingles 11/2019   left  arm and shoulder    Past Surgical History:  Procedure Laterality Date   ABDOMINAL AORTOGRAM W/LOWER EXTREMITY Bilateral 03/03/2020   Procedure: ABDOMINAL AORTOGRAM W/LOWER EXTREMITY;  Surgeon: Marty Heck, MD;  Location: [facility];  Service: Cardiovascular;  Laterality: Bilateral;   BREAST BIOPSY Left 02/02/2021   BREAST EXCISIONAL BIOPSY Left 1975   BRONCHIAL BIOPSY  06/14/2021   Procedure: BRONCHIAL BIOPSIES;  Surgeon: Garner Nash, DO;  Location: [facility];  Service: Pulmonary;;   BRONCHIAL BRUSHINGS  06/14/2021   Procedure: BRONCHIAL BRUSHINGS;  Surgeon: Garner Nash, DO;  Location: [facility];  Service: Pulmonary;;   BRONCHIAL NEEDLE ASPIRATION BIOPSY  06/14/2021   Procedure: BRONCHIAL NEEDLE ASPIRATION BIOPSIES;  Surgeon: Garner Nash, DO;  Location: [facility];  Service: Pulmonary;;   COLONOSCOPY  ?2015   CRYOTHERAPY   06/14/2021   Procedure: CRYOTHERAPY;  Surgeon: Garner Nash, DO;  Location: [facility];  Service: Pulmonary;;   CYSTOSCOPY WITH BIOPSY N/A 04/12/2020   Procedure: CYSTOSCOPY WITH BIOPSY  INSTILLATION OF GEMCITABINE;  Surgeon: Lucas Mallow, MD;  Location: [facility];  Service: Urology;  Laterality: N/A;   DILATION AND CURETTAGE OF UTERUS  yrs ago   HEMOSTASIS CONTROL  06/14/2021   Procedure: HEMOSTASIS CONTROL;  Surgeon: Garner Nash, DO;  Location: [facility];  Service: Pulmonary;;   PERIPHERAL VASCULAR INTERVENTION Right 03/03/2020   Procedure: PERIPHERAL VASCULAR INTERVENTION;  Surgeon: Marty Heck, MD;  Location: [facility];  Service: Cardiovascular;  Laterality: Right;   RADIOACTIVE SEED GUIDED EXCISIONAL BREAST BIOPSY Left 02/02/2021   Procedure: RADIOACTIVE SEED GUIDED EXCISIONAL LEFT BREAST BIOPSY;  Surgeon: Stark Klein, MD;  Location: [facility];  Service: General;  Laterality: Left;   [place]  yrs ago   complete   TRANSURETHRAL RESECTION OF BLADDER TUMOR N/A 09/18/2018   Procedure: RESTAGE TRANSURETHRAL RESECTION OF BLADDER TUMOR (TURBT);  Surgeon: Lucas Mallow, MD;  Location: [facility];  Service: Urology;  Laterality: N/A;   TRANSURETHRAL RESECTION OF BLADDER TUMOR WITH MITOMYCIN-C N/A 08/16/2018   Procedure: TRANSURETHRAL RESECTION OF BLADDER TUMOR;  Surgeon: Lucas Mallow, MD;  Location: [facility];  Service: Urology;  Laterality: N/A;   VIDEO BRONCHOSCOPY WITH ENDOBRONCHIAL ULTRASOUND N/A 06/14/2021   Procedure: VIDEO BRONCHOSCOPY WITH ENDOBRONCHIAL ULTRASOUND;  Surgeon: Garner Nash, DO;  Location: [facility];  Service: Pulmonary;  Laterality: N/A;    Prior to Admission medications   Medication Sig Start Date End Date Taking? Authorizing Provider  acetaminophen (TYLENOL) 500 MG tablet Take 1,000 mg by mouth 2 (two) times  daily as needed for mild pain.   Yes [provider]  atorvastatin (LIPITOR) 10 MG tablet TAKE 1 TABLET BY MOUTH EVERY DAY Patient taking differently: Take 10 mg by mouth daily. 03/31/22  Yes Waynetta Sandy, MD  CALCIUM PO Take 1 tablet by mouth daily.   Yes [provider]  Cholecalciferol (VITAMIN D-3 PO) Take 1 capsule by mouth daily.   Yes [provider]  ELIQUIS 5 MG TABS tablet TAKE 1 TABLET BY MOUTH TWICE A DAY Patient taking differently: Take 5 mg by mouth 2 (two) times daily. 12/19/21  Yes Billie Ruddy, MD  KLOR-CON M20 20 MEQ tablet TAKE 1 TABLET BY MOUTH EVERY DAY Patient taking differently: 20 mEq daily. 04/10/22  Yes Billie Ruddy, MD  lisinopril-hydrochlorothiazide (ZESTORETIC) 20-25 MG tablet TAKE 1 TABLET  BY MOUTH EVERY DAY Patient taking differently: Take 1 tablet by mouth daily. 02/03/22  Yes Billie Ruddy, MD  MELATONIN PO Take 1 tablet by mouth daily.   Yes [provider]  omeprazole (PRILOSEC) 20 MG capsule TAKE 1 CAPSULE BY MOUTH EVERY DAY Patient taking differently: Take 20 mg by mouth daily. 04/04/22  Yes Billie Ruddy, MD  triamcinolone ointment (KENALOG) 0.1 % Apply 1 application  topically 2 (two) times daily as needed (itching).   Yes [provider]    Scheduled Meds:  atorvastatin  10 mg Oral Daily   dronabinol  2.5 mg Oral BID AC   [START ON 04/14/2022] enoxaparin (LOVENOX) injection  40 mg Subcutaneous Daily   feeding supplement  237 mL Oral BID BM   lisinopril  20 mg Oral Daily   And   hydrochlorothiazide  25 mg Oral Daily   melatonin  5 mg Oral QHS   potassium chloride  40 mEq Oral Q2H   Infusions:  potassium chloride 10 mEq (04/12/22 1330)   sodium chloride 0.9 % 1,000 mL with M.V.I. Adult (INFUVITE ADULT) 10 mL infusion     PRN Meds: acetaminophen   Allergies as of 04/12/2022 - Review Complete 04/12/2022  Allergen Reaction Noted   Vibra-tab [doxycycline] Palpitations 06/06/2007     Family History  Problem Relation Age of Onset   Colon cancer Neg Hx    Colon polyps Neg Hx    Esophageal cancer Neg Hx    Rectal cancer Neg Hx    Stomach cancer Neg Hx    Pancreatic cancer Neg Hx    Breast cancer Neg Hx     Social History   Socioeconomic History   Marital status: Single    Spouse name: Not on file   Number of children: 0   Years of education: 6 years college   Highest education level: Master's degree (e.g., MA, MS, MEng, MEd, MSW, MBA)  Occupational History   Occupation: retired  Tobacco Use   Smoking status: Former    Packs/day: 1.00    Years: 50.00    Total pack years: 50.00    Types: Cigarettes    Start date: 1966   Smokeless tobacco: Never   Tobacco comments:    Stopped smoking 07/10/2021  Vaping Use   Vaping Use: Never used  Substance and Sexual Activity   Alcohol use: Not Currently    Alcohol/week: 2.0 standard drinks of alcohol    Types: 2 Shots of liquor per week    Comment: daily alcohol   Drug use: No   Sexual activity: Not on file  Other Topics Concern   Not on file  Social History Narrative   Not on file   Social Determinants of Health   Financial Resource Strain: Low Risk  (10/27/2021)   Overall Financial Resource Strain (CARDIA)    Difficulty of Paying Living Expenses: Not hard at all  Food Insecurity: No Food Insecurity (10/27/2021)   Hunger Vital Sign    Worried About Running Out of Food in the Last Year: Never true    Ran Out of Food in the Last Year: Never true  Transportation Needs: No Transportation Needs (10/27/2021)   PRAPARE - Hydrologist (Medical): No    Lack of Transportation (Non-Medical): No  Physical Activity: Inactive (10/27/2021)   Exercise Vital Sign    Days of Exercise per Week: 0 days    Minutes of Exercise per Session: 0 min  Stress: No  Stress Concern Present (10/27/2021)   [place]    Feeling of Stress :  Not at all  Social Connections: Moderately Integrated (10/27/2021)   Social Connection and Isolation Panel [NHANES]    Frequency of Communication with Friends and Family: More than three times a week    Frequency of Social Gatherings with Friends and Family: More than three times a week    Attends Religious Services: More than 4 times per year    Active Member of [employer] or Organizations: Yes    Attends Archivist Meetings: More than 4 times per year    Marital Status: Divorced  Intimate Partner Violence: Not At Risk (10/27/2021)   Humiliation, Afraid, Rape, and Kick questionnaire    Fear of Current or Ex-Partner: No    Emotionally Abused: No    Physically Abused: No    Sexually Abused: No    REVIEW OF SYSTEMS: Constitutional: Fatigue and weakness. ENT:  No nose bleeds Pulm: Slight dyspnea on exertion CV:  No palpitations, no LE edema.  No angina GU:  No hematuria, no frequency GI: Per HPI. Heme: Denies excessive or unusual bleeding or bruising Transfusions: None Neuro:  No headaches, no peripheral tingling or numbness Derm:  No itching, no rash or sores.  Endocrine:  No sweats or chills.  No polyuria or dysuria Immunization: Reviewed. Travel:  None    PHYSICAL EXAM: Vital signs in last 24 hours: Vitals:   04/12/22 1017 04/12/22 1130  BP:  (!) 165/86  Pulse:  (!) 105  Resp:  (!) 23  Temp: 98 F (36.7 C)   SpO2:  100%   Wt Readings from Last 3 Encounters:  03/21/22 53.1 kg  01/23/22 61.7 kg  01/05/22 61.9 kg    General: Patient looks unwell.  Lethargic but alert. Head: No facial asymmetry or swelling.  No signs of head trauma. Eyes: Conjunctiva pink. Ears: Not hard of hearing Nose: No congestion or discharge Mouth: Mucosa pink, moist, clear.  Tongue midline Neck: No JVD, masses, thyromegaly Lungs: No labored breathing, no cough.  Lungs clear though diminished breath sounds bilaterally Heart: RRR.  No MRG.  S1, S2 present. Abdomen: Soft without  tenderness.  No HSM, masses, bruits, hernias..   Rectal: Brown stool, no masses, stool tests FOBT positive per exam by ED staff.  I did not repeat the rectal exam. Musc/Skeltl: Slight sarcopenia.  No joint redness or swelling. Extremities: No CCE. Neurologic: Oriented to place, person, time.  Moves all 4 limbs without tremor or gross weakness but strength not formally evaluated. Skin: No obvious sores, rashes or suspicious lesions on incomplete dermatologic survey.  Note that on ED examiner there were bedbugs crawling on the patient's pants.  I did not see bedbugs though her clothing had been removed and she was wearing a diaper. Nodes: No cervical adenopathy Psych: Cooperative, flat affect due to illness or depression?  Intake/Output from previous day: No intake/output data recorded. Intake/Output this shift: No intake/output data recorded.  LAB RESULTS: Recent Labs    04/12/22 0918  WBC 13.6*  HGB 7.9*  HCT 26.5*  PLT 130*   BMET Lab Results  Component Value Date   NA 144 04/12/2022   NA 139 01/05/2022   NA 141 07/21/2021   K 2.1 (LL) 04/12/2022   K 3.9 01/05/2022   K 4.0 07/21/2021   CL 98 04/12/2022   CL 99 01/05/2022   CL 101 07/21/2021   CO2 32 04/12/2022  CO2 32 01/05/2022   CO2 30 07/21/2021   GLUCOSE 164 (H) 04/12/2022   GLUCOSE 101 (H) 01/05/2022   GLUCOSE 93 07/21/2021   BUN 15 04/12/2022   BUN 18 01/05/2022   BUN 15 07/21/2021   CREATININE 0.75 04/12/2022   CREATININE 0.78 01/05/2022   CREATININE 0.78 07/21/2021   CALCIUM 12.1 (H) 04/12/2022   CALCIUM 10.0 01/05/2022   CALCIUM 10.2 07/21/2021   LFT Recent Labs    04/12/22 0918  PROT 6.2*  ALBUMIN 3.5  AST 55*  ALT 24  ALKPHOS 47  BILITOT 1.6*   PT/INR Lab Results  Component Value Date   INR 1.5 (H) 04/12/2022   Hepatitis Panel No results for input(s): "HEPBSAG", "HCVAB", "HEPAIGM", "HEPBIGM" in the last 72 hours. C-Diff No components found for: "CDIFF" Lipase  No results found for:  "LIPASE"  Drugs of Abuse  No results found for: "LABOPIA", "COCAINSCRNUR", "LABBENZ", "AMPHETMU", "THCU", "LABBARB"   RADIOLOGY STUDIES: CT HEAD WO CONTRAST (5MM)  Result Date: 04/12/2022 CLINICAL DATA:  Head trauma, minor (Age >= 65y); Neck trauma (Age >= 65y) EXAM: CT HEAD WITHOUT CONTRAST CT CERVICAL SPINE WITHOUT CONTRAST TECHNIQUE: Multidetector CT imaging of the head and cervical spine was performed following the standard protocol without intravenous contrast. Multiplanar CT image reconstructions of the cervical spine were also generated. RADIATION DOSE REDUCTION: This exam was performed according to the departmental dose-optimization program which includes automated exposure control, adjustment of the mA and/or kV according to patient size and/or use of iterative reconstruction technique. COMPARISON:  None Available. FINDINGS: CT HEAD FINDINGS Brain: No evidence of acute infarction, hemorrhage, hydrocephalus, extra-axial collection or mass lesion/mass effect. Patchy white matter hypoattenuation nonspecific but compatible with chronic microvascular ischemic disease. Vascular: Calcific intracranial atherosclerosis. Skull: No acute fracture. Sinuses/Orbits: Clear sinuses.  No acute orbital findings. Other: No mastoid effusions. CT CERVICAL SPINE FINDINGS Alignment: No substantial sagittal subluxation. Skull base and vertebrae: Vertebral body heights are maintained. No evidence of acute fracture. Soft tissues and spinal canal: No prevertebral fluid or swelling. No visible canal hematoma. Disc levels: Multilevel facet and uncovertebral hypertrophy contributes to varying degrees of neural foraminal stenosis. Bridging osteophytes and endplate spurring at multiple levels. Upper chest: Visualized lung apices are clear. IMPRESSION: 1. No evidence of acute intracranial abnormality. 2. No evidence of acute fracture or traumatic malalignment in the cervical spine. Electronically Signed   By: Margaretha Sheffield M.D.    On: 04/12/2022 10:44   CT Cervical Spine Wo Contrast  Result Date: 04/12/2022 CLINICAL DATA:  Head trauma, minor (Age >= 65y); Neck trauma (Age >= 65y) EXAM: CT HEAD WITHOUT CONTRAST CT CERVICAL SPINE WITHOUT CONTRAST TECHNIQUE: Multidetector CT imaging of the head and cervical spine was performed following the standard protocol without intravenous contrast. Multiplanar CT image reconstructions of the cervical spine were also generated. RADIATION DOSE REDUCTION: This exam was performed according to the departmental dose-optimization program which includes automated exposure control, adjustment of the mA and/or kV according to patient size and/or use of iterative reconstruction technique. COMPARISON:  None Available. FINDINGS: CT HEAD FINDINGS Brain: No evidence of acute infarction, hemorrhage, hydrocephalus, extra-axial collection or mass lesion/mass effect. Patchy white matter hypoattenuation nonspecific but compatible with chronic microvascular ischemic disease. Vascular: Calcific intracranial atherosclerosis. Skull: No acute fracture. Sinuses/Orbits: Clear sinuses.  No acute orbital findings. Other: No mastoid effusions. CT CERVICAL SPINE FINDINGS Alignment: No substantial sagittal subluxation. Skull base and vertebrae: Vertebral body heights are maintained. No evidence of acute fracture. Soft tissues and spinal canal: No  prevertebral fluid or swelling. No visible canal hematoma. Disc levels: Multilevel facet and uncovertebral hypertrophy contributes to varying degrees of neural foraminal stenosis. Bridging osteophytes and endplate spurring at multiple levels. Upper chest: Visualized lung apices are clear. IMPRESSION: 1. No evidence of acute intracranial abnormality. 2. No evidence of acute fracture or traumatic malalignment in the cervical spine. Electronically Signed   By: Margaretha Sheffield M.D.   On: 04/12/2022 10:44   DG Pelvis Portable  Result Date: 04/12/2022 CLINICAL DATA:  Fall and weakness.  EXAM: PORTABLE PELVIS 1-2 VIEWS COMPARISON:  Lumbar spine 03/01/2022 FINDINGS: Pelvic bony ring is intact. Degenerative changes in the lower lumbar spine. Again noted is a right iliac vascular stent. Large amount stool in the rectum. Degenerative changes along the lateral aspect of the acetabula bilaterally. No gross abnormality to either hip. IMPRESSION: No acute bone abnormality in the pelvis. Electronically Signed   By: Markus Daft M.D.   On: 04/12/2022 10:28   DG Chest Port 1 View  Result Date: 04/12/2022 CLINICAL DATA:  Fall and weakness. EXAM: PORTABLE CHEST 1 VIEW COMPARISON:  Chest radiograph 11/24/2019 FINDINGS: Single view of the chest demonstrates clear lungs. Large bridging osteophytes in the thoracic spine. Heart and mediastinum are within normal limits. Negative for a pneumothorax. Trachea is midline. IMPRESSION: No active disease. Electronically Signed   By: Markus Daft M.D.   On: 04/12/2022 10:26      IMPRESSION:   3 weeks anorexia without nausea, vomiting, abdominal pain or other overt GI complaints.     Normocytic anemia.  Current Hgb 7.9 represents 3.5 g drop from 3 months ago.  Certainly her weakness, fatigue attributable to anemia but suspect this is multifactorial in setting of diminished p.o. intake.  No transfusions ordered.    FOBT positive without overt melena, bleeding per rectum, bloody/CGE emesis, abdominal pain, reflux symptoms.  Hx of recurrent adenomatous colon polyps, last BP with polypectomy and findings of nonbleeding cecal, ascending colon AVMs 2019.  Dr Carlean Purl has advised patient she does not need follow-up surveillance.  Minor elevation T. bili and AST.  Patient denies consumption of alcohol.  Hypokalemia.  Chronic Eliquis for history DVT, PAD, A-fib.  2022 non-small cell lung cancer obstructing right middle lobe, debulked at bronchoscopy and completed radiation.  Declined further surgical management.  Has follow-up with radiation MD Dr. Sondra Come in 07/2022.   Latest chest CT was 01/20/2022 showing significant reduction in size of right middle lobe mass, representing treatment response.  No change in subcentimeter RML nodule, likely benign/incidental.  History urothelial carcinoma in situ.  Resected 03/2020.  01/2021 L breast lumpectomy, intraductal papilloma  PLAN:       Begin w EGD tomorrow.  Clears today.  Await results of iron/anemia studies.     Azucena Freed  04/12/2022, 2:36 PM Phone [phone]  GI ATTENDING  History, laboratories, x-rays, prior endoscopy report reviewed.  Patient seen and examined.  Agree with comprehensive consultation note as outlined above.  We are asked to see the patient for symptomatic interval anemia.  Hemoccult positive stool.  She is on chronic anticoagulation.  Prior colonoscopy revealed right-sided AVMs.  This is the likely cause for chronic GI blood loss.  Plans for upper endoscopy to rule out upper GI mucosal lesions.  Anticoagulation has been held.  She has had no overt bleeding.  She should be transfused 2 an appropriate hemoglobin level to treat the symptoms.  She will need chronic iron therapy.  If she has refractory or recurrent  anemia, she may need colonoscopy with the intent of therapeutics.  At this point, she is a bit too frail for elective colonoscopy.  Upper endoscopy tomorrow morning.  Patient is HIGH RISK given her comorbidities, age and interruption of anticoagulation.  The nature of the procedure, as well as the risks, benefits, and alternatives were carefully and thoroughly reviewed with the patient. Ample time for discussion and questions allowed. The patient understood, was satisfied, and agreed to proceed.  Docia Chuck. Geri Seminole., M.D. [facility] Division of Gastroenterology

## 2022-04-12 NOTE — Consult Note (Addendum)
[facility] Consult: 2:36 PM 04/12/2022  LOS: 0 days    Referring Provider: DR Roosevelt Locks  Primary Care Physician:  Billie Ruddy, MD Primary Gastroenterologist:  Dr. Carlean Purl  Oncologist: Dr. Earlie Server, Dr Sondra Come.      Reason for Consultation:  Anemia.     HPI: KASONDRA JUNOD is a 81 y.o. female.  Hx a fib.  Lower extremity blood clot.  On eliquis.  PAD, right external iliac artery stent 2021.  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  Pt declined surgical resection.  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  Resection of urinary bladder tumor, carcinoma in situ 2019.  Thrombocytopenia, intermittent dating back  to 2012.  Elevated LFTs remotely, hepatic cysts on 2010 ultrasound.  Adenomatous colon polyps in 2005, 2011, 2019. 12/2017 colonoscopy.  For polyp surveillance.  Dr. Carlean Purl removed 3 polyps, one was 8 mm pedunculated, the other 2 sessile polyps were diminutive.  Path: TAs.  Nonbleeding cecal and ascending AVMs.  No EGD in records.    In ED as of this morning for evaluation of generalized weakness.  Anorexia, declined p.o. intake starting 2 weeks ago.  Consuming 2-3 servings of Ensure daily.  Feels like she has lost weight.  Occasional loose, nonbloody diarrhea but no abdominal pain, nausea or vomiting.  No blood in her stool.  No dysphagia or choking with PO.  Is gotten to the point where she is so weak she cannot get up from her chair.  Had a fall a couple of days ago but no pain afterwards. Last Eliquis was 6/27 PM.     In ED Hb 7.9, it was 11.4 three months ago.  MCV 91.  Platelets 130 K T. bili 1.6, direct bilirubin normal 0.3.  Alk phos 47,  normal.  AST 55, ALT 24.  BUN normal. BUN/creatinine normal.  Hypokalemia at 2.1. TSH normal.   CK total normal. Head and C-spine CT unremarkable. Stool heme positive  No family history of colorectal or gastric cancers.  Pancreatic or liver disease. No alcohol.  Pulmonary note of mid March 2023 noted she was still smoking. Lives alone.  Does not have local family support but relies on friends for support and transportation to medical appointments.  Past Medical History:  Diagnosis Date   ALLERGIC RHINITIS 09/20/2009   Arthritis    LEGS and left arm   Bladder tumor    Cataract    both eyes, right worse thsn left   COLONIC POLYPS, HX OF 06/06/2007   DYSLIPIDEMIA 09/20/2009   Dyspnea    sob with activity   GERD (gastroesophageal reflux disease)    History of radiation therapy    Right lung- 09/01/21-09/16/21- Dr. [name]   HYPERTENSION 06/06/2007   Knee pain    stiff and pain on side   LIVER FUNCTION TESTS, ABNORMAL, HX OF    feb or march 2019   Peripheral vascular disease ([place])    Shingles 11/2019   left  arm and shoulder    Past Surgical History:  Procedure Laterality Date   ABDOMINAL AORTOGRAM W/LOWER EXTREMITY Bilateral 03/03/2020   Procedure: ABDOMINAL AORTOGRAM W/LOWER EXTREMITY;  Surgeon: Marty Heck, MD;  Location: [facility];  Service: Cardiovascular;  Laterality: Bilateral;   BREAST BIOPSY Left 02/02/2021   BREAST EXCISIONAL BIOPSY Left 1975   BRONCHIAL BIOPSY  06/14/2021   Procedure: BRONCHIAL BIOPSIES;  Surgeon: Garner Nash, DO;  Location: [facility];  Service: Pulmonary;;   BRONCHIAL BRUSHINGS  06/14/2021   Procedure: BRONCHIAL BRUSHINGS;  Surgeon: Garner Nash, DO;  Location: [facility];  Service: Pulmonary;;   BRONCHIAL NEEDLE ASPIRATION BIOPSY  06/14/2021   Procedure: BRONCHIAL NEEDLE ASPIRATION BIOPSIES;  Surgeon: Garner Nash, DO;  Location: [facility];  Service: Pulmonary;;   COLONOSCOPY  ?2015   CRYOTHERAPY   06/14/2021   Procedure: CRYOTHERAPY;  Surgeon: Garner Nash, DO;  Location: [facility];  Service: Pulmonary;;   CYSTOSCOPY WITH BIOPSY N/A 04/12/2020   Procedure: CYSTOSCOPY WITH BIOPSY  INSTILLATION OF GEMCITABINE;  Surgeon: Lucas Mallow, MD;  Location: [facility];  Service: Urology;  Laterality: N/A;   DILATION AND CURETTAGE OF UTERUS  yrs ago   HEMOSTASIS CONTROL  06/14/2021   Procedure: HEMOSTASIS CONTROL;  Surgeon: Garner Nash, DO;  Location: [facility];  Service: Pulmonary;;   PERIPHERAL VASCULAR INTERVENTION Right 03/03/2020   Procedure: PERIPHERAL VASCULAR INTERVENTION;  Surgeon: Marty Heck, MD;  Location: [facility];  Service: Cardiovascular;  Laterality: Right;   RADIOACTIVE SEED GUIDED EXCISIONAL BREAST BIOPSY Left 02/02/2021   Procedure: RADIOACTIVE SEED GUIDED EXCISIONAL LEFT BREAST BIOPSY;  Surgeon: Stark Klein, MD;  Location: [facility];  Service: General;  Laterality: Left;   [place]  yrs ago   complete   TRANSURETHRAL RESECTION OF BLADDER TUMOR N/A 09/18/2018   Procedure: RESTAGE TRANSURETHRAL RESECTION OF BLADDER TUMOR (TURBT);  Surgeon: Lucas Mallow, MD;  Location: [facility];  Service: Urology;  Laterality: N/A;   TRANSURETHRAL RESECTION OF BLADDER TUMOR WITH MITOMYCIN-C N/A 08/16/2018   Procedure: TRANSURETHRAL RESECTION OF BLADDER TUMOR;  Surgeon: Lucas Mallow, MD;  Location: [facility];  Service: Urology;  Laterality: N/A;   VIDEO BRONCHOSCOPY WITH ENDOBRONCHIAL ULTRASOUND N/A 06/14/2021   Procedure: VIDEO BRONCHOSCOPY WITH ENDOBRONCHIAL ULTRASOUND;  Surgeon: Garner Nash, DO;  Location: [facility];  Service: Pulmonary;  Laterality: N/A;    Prior to Admission medications   Medication Sig Start Date End Date Taking? Authorizing Provider  acetaminophen (TYLENOL) 500 MG tablet Take 1,000 mg by mouth 2 (two) times  daily as needed for mild pain.   Yes [provider]  atorvastatin (LIPITOR) 10 MG tablet TAKE 1 TABLET BY MOUTH EVERY DAY Patient taking differently: Take 10 mg by mouth daily. 03/31/22  Yes Waynetta Sandy, MD  CALCIUM PO Take 1 tablet by mouth daily.   Yes [provider]  Cholecalciferol (VITAMIN D-3 PO) Take 1 capsule by mouth daily.   Yes [provider]  ELIQUIS 5 MG TABS tablet TAKE 1 TABLET BY MOUTH TWICE A DAY Patient taking differently: Take 5 mg by mouth 2 (two) times daily. 12/19/21  Yes Billie Ruddy, MD  KLOR-CON M20 20 MEQ tablet TAKE 1 TABLET BY MOUTH EVERY DAY Patient taking differently: 20 mEq daily. 04/10/22  Yes Billie Ruddy, MD  lisinopril-hydrochlorothiazide (ZESTORETIC) 20-25 MG tablet TAKE 1 TABLET  BY MOUTH EVERY DAY Patient taking differently: Take 1 tablet by mouth daily. 02/03/22  Yes Billie Ruddy, MD  MELATONIN PO Take 1 tablet by mouth daily.   Yes [provider]  omeprazole (PRILOSEC) 20 MG capsule TAKE 1 CAPSULE BY MOUTH EVERY DAY Patient taking differently: Take 20 mg by mouth daily. 04/04/22  Yes Billie Ruddy, MD  triamcinolone ointment (KENALOG) 0.1 % Apply 1 application  topically 2 (two) times daily as needed (itching).   Yes [provider]    Scheduled Meds:  atorvastatin  10 mg Oral Daily   dronabinol  2.5 mg Oral BID AC   [START ON 04/14/2022] enoxaparin (LOVENOX) injection  40 mg Subcutaneous Daily   feeding supplement  237 mL Oral BID BM   lisinopril  20 mg Oral Daily   And   hydrochlorothiazide  25 mg Oral Daily   melatonin  5 mg Oral QHS   potassium chloride  40 mEq Oral Q2H   Infusions:  potassium chloride 10 mEq (04/12/22 1330)   sodium chloride 0.9 % 1,000 mL with M.V.I. Adult (INFUVITE ADULT) 10 mL infusion     PRN Meds: acetaminophen   Allergies as of 04/12/2022 - Review Complete 04/12/2022  Allergen Reaction Noted   Vibra-tab [doxycycline] Palpitations 06/06/2007     Family History  Problem Relation Age of Onset   Colon cancer Neg Hx    Colon polyps Neg Hx    Esophageal cancer Neg Hx    Rectal cancer Neg Hx    Stomach cancer Neg Hx    Pancreatic cancer Neg Hx    Breast cancer Neg Hx     Social History   Socioeconomic History   Marital status: Single    Spouse name: Not on file   Number of children: 0   Years of education: 6 years college   Highest education level: Master's degree (e.g., MA, MS, MEng, MEd, MSW, MBA)  Occupational History   Occupation: retired  Tobacco Use   Smoking status: Former    Packs/day: 1.00    Years: 50.00    Total pack years: 50.00    Types: Cigarettes    Start date: 1966   Smokeless tobacco: Never   Tobacco comments:    Stopped smoking 07/10/2021  Vaping Use   Vaping Use: Never used  Substance and Sexual Activity   Alcohol use: Not Currently    Alcohol/week: 2.0 standard drinks of alcohol    Types: 2 Shots of liquor per week    Comment: daily alcohol   Drug use: No   Sexual activity: Not on file  Other Topics Concern   Not on file  Social History Narrative   Not on file   Social Determinants of Health   Financial Resource Strain: Low Risk  (10/27/2021)   Overall Financial Resource Strain (CARDIA)    Difficulty of Paying Living Expenses: Not hard at all  Food Insecurity: No Food Insecurity (10/27/2021)   Hunger Vital Sign    Worried About Running Out of Food in the Last Year: Never true    Ran Out of Food in the Last Year: Never true  Transportation Needs: No Transportation Needs (10/27/2021)   PRAPARE - Hydrologist (Medical): No    Lack of Transportation (Non-Medical): No  Physical Activity: Inactive (10/27/2021)   Exercise Vital Sign    Days of Exercise per Week: 0 days    Minutes of Exercise per Session: 0 min  Stress: No  Stress Concern Present (10/27/2021)   [place]    Feeling of Stress :  Not at all  Social Connections: Moderately Integrated (10/27/2021)   Social Connection and Isolation Panel [NHANES]    Frequency of Communication with Friends and Family: More than three times a week    Frequency of Social Gatherings with Friends and Family: More than three times a week    Attends Religious Services: More than 4 times per year    Active Member of [employer] or Organizations: Yes    Attends Archivist Meetings: More than 4 times per year    Marital Status: Divorced  Intimate Partner Violence: Not At Risk (10/27/2021)   Humiliation, Afraid, Rape, and Kick questionnaire    Fear of Current or Ex-Partner: No    Emotionally Abused: No    Physically Abused: No    Sexually Abused: No    REVIEW OF SYSTEMS: Constitutional: Fatigue and weakness. ENT:  No nose bleeds Pulm: Slight dyspnea on exertion CV:  No palpitations, no LE edema.  No angina GU:  No hematuria, no frequency GI: Per HPI. Heme: Denies excessive or unusual bleeding or bruising Transfusions: None Neuro:  No headaches, no peripheral tingling or numbness Derm:  No itching, no rash or sores.  Endocrine:  No sweats or chills.  No polyuria or dysuria Immunization: Reviewed. Travel:  None    PHYSICAL EXAM: Vital signs in last 24 hours: Vitals:   04/12/22 1017 04/12/22 1130  BP:  (!) 165/86  Pulse:  (!) 105  Resp:  (!) 23  Temp: 98 F (36.7 C)   SpO2:  100%   Wt Readings from Last 3 Encounters:  03/21/22 53.1 kg  01/23/22 61.7 kg  01/05/22 61.9 kg    General: Patient looks unwell.  Lethargic but alert. Head: No facial asymmetry or swelling.  No signs of head trauma. Eyes: Conjunctiva pink. Ears: Not hard of hearing Nose: No congestion or discharge Mouth: Mucosa pink, moist, clear.  Tongue midline Neck: No JVD, masses, thyromegaly Lungs: No labored breathing, no cough.  Lungs clear though diminished breath sounds bilaterally Heart: RRR.  No MRG.  S1, S2 present. Abdomen: Soft without  tenderness.  No HSM, masses, bruits, hernias..   Rectal: Brown stool, no masses, stool tests FOBT positive per exam by ED staff.  I did not repeat the rectal exam. Musc/Skeltl: Slight sarcopenia.  No joint redness or swelling. Extremities: No CCE. Neurologic: Oriented to place, person, time.  Moves all 4 limbs without tremor or gross weakness but strength not formally evaluated. Skin: No obvious sores, rashes or suspicious lesions on incomplete dermatologic survey.  Note that on ED examiner there were bedbugs crawling on the patient's pants.  I did not see bedbugs though her clothing had been removed and she was wearing a diaper. Nodes: No cervical adenopathy Psych: Cooperative, flat affect due to illness or depression?  Intake/Output from previous day: No intake/output data recorded. Intake/Output this shift: No intake/output data recorded.  LAB RESULTS: Recent Labs    04/12/22 0918  WBC 13.6*  HGB 7.9*  HCT 26.5*  PLT 130*   BMET Lab Results  Component Value Date   NA 144 04/12/2022   NA 139 01/05/2022   NA 141 07/21/2021   K 2.1 (LL) 04/12/2022   K 3.9 01/05/2022   K 4.0 07/21/2021   CL 98 04/12/2022   CL 99 01/05/2022   CL 101 07/21/2021   CO2 32 04/12/2022  CO2 32 01/05/2022   CO2 30 07/21/2021   GLUCOSE 164 (H) 04/12/2022   GLUCOSE 101 (H) 01/05/2022   GLUCOSE 93 07/21/2021   BUN 15 04/12/2022   BUN 18 01/05/2022   BUN 15 07/21/2021   CREATININE 0.75 04/12/2022   CREATININE 0.78 01/05/2022   CREATININE 0.78 07/21/2021   CALCIUM 12.1 (H) 04/12/2022   CALCIUM 10.0 01/05/2022   CALCIUM 10.2 07/21/2021   LFT Recent Labs    04/12/22 0918  PROT 6.2*  ALBUMIN 3.5  AST 55*  ALT 24  ALKPHOS 47  BILITOT 1.6*   PT/INR Lab Results  Component Value Date   INR 1.5 (H) 04/12/2022   Hepatitis Panel No results for input(s): "HEPBSAG", "HCVAB", "HEPAIGM", "HEPBIGM" in the last 72 hours. C-Diff No components found for: "CDIFF" Lipase  No results found for:  "LIPASE"  Drugs of Abuse  No results found for: "LABOPIA", "COCAINSCRNUR", "LABBENZ", "AMPHETMU", "THCU", "LABBARB"   RADIOLOGY STUDIES: CT HEAD WO CONTRAST (5MM)  Result Date: 04/12/2022 CLINICAL DATA:  Head trauma, minor (Age >= 65y); Neck trauma (Age >= 65y) EXAM: CT HEAD WITHOUT CONTRAST CT CERVICAL SPINE WITHOUT CONTRAST TECHNIQUE: Multidetector CT imaging of the head and cervical spine was performed following the standard protocol without intravenous contrast. Multiplanar CT image reconstructions of the cervical spine were also generated. RADIATION DOSE REDUCTION: This exam was performed according to the departmental dose-optimization program which includes automated exposure control, adjustment of the mA and/or kV according to patient size and/or use of iterative reconstruction technique. COMPARISON:  None Available. FINDINGS: CT HEAD FINDINGS Brain: No evidence of acute infarction, hemorrhage, hydrocephalus, extra-axial collection or mass lesion/mass effect. Patchy white matter hypoattenuation nonspecific but compatible with chronic microvascular ischemic disease. Vascular: Calcific intracranial atherosclerosis. Skull: No acute fracture. Sinuses/Orbits: Clear sinuses.  No acute orbital findings. Other: No mastoid effusions. CT CERVICAL SPINE FINDINGS Alignment: No substantial sagittal subluxation. Skull base and vertebrae: Vertebral body heights are maintained. No evidence of acute fracture. Soft tissues and spinal canal: No prevertebral fluid or swelling. No visible canal hematoma. Disc levels: Multilevel facet and uncovertebral hypertrophy contributes to varying degrees of neural foraminal stenosis. Bridging osteophytes and endplate spurring at multiple levels. Upper chest: Visualized lung apices are clear. IMPRESSION: 1. No evidence of acute intracranial abnormality. 2. No evidence of acute fracture or traumatic malalignment in the cervical spine. Electronically Signed   By: Margaretha Sheffield M.D.    On: 04/12/2022 10:44   CT Cervical Spine Wo Contrast  Result Date: 04/12/2022 CLINICAL DATA:  Head trauma, minor (Age >= 65y); Neck trauma (Age >= 65y) EXAM: CT HEAD WITHOUT CONTRAST CT CERVICAL SPINE WITHOUT CONTRAST TECHNIQUE: Multidetector CT imaging of the head and cervical spine was performed following the standard protocol without intravenous contrast. Multiplanar CT image reconstructions of the cervical spine were also generated. RADIATION DOSE REDUCTION: This exam was performed according to the departmental dose-optimization program which includes automated exposure control, adjustment of the mA and/or kV according to patient size and/or use of iterative reconstruction technique. COMPARISON:  None Available. FINDINGS: CT HEAD FINDINGS Brain: No evidence of acute infarction, hemorrhage, hydrocephalus, extra-axial collection or mass lesion/mass effect. Patchy white matter hypoattenuation nonspecific but compatible with chronic microvascular ischemic disease. Vascular: Calcific intracranial atherosclerosis. Skull: No acute fracture. Sinuses/Orbits: Clear sinuses.  No acute orbital findings. Other: No mastoid effusions. CT CERVICAL SPINE FINDINGS Alignment: No substantial sagittal subluxation. Skull base and vertebrae: Vertebral body heights are maintained. No evidence of acute fracture. Soft tissues and spinal canal: No  prevertebral fluid or swelling. No visible canal hematoma. Disc levels: Multilevel facet and uncovertebral hypertrophy contributes to varying degrees of neural foraminal stenosis. Bridging osteophytes and endplate spurring at multiple levels. Upper chest: Visualized lung apices are clear. IMPRESSION: 1. No evidence of acute intracranial abnormality. 2. No evidence of acute fracture or traumatic malalignment in the cervical spine. Electronically Signed   By: Margaretha Sheffield M.D.   On: 04/12/2022 10:44   DG Pelvis Portable  Result Date: 04/12/2022 CLINICAL DATA:  Fall and weakness.  EXAM: PORTABLE PELVIS 1-2 VIEWS COMPARISON:  Lumbar spine 03/01/2022 FINDINGS: Pelvic bony ring is intact. Degenerative changes in the lower lumbar spine. Again noted is a right iliac vascular stent. Large amount stool in the rectum. Degenerative changes along the lateral aspect of the acetabula bilaterally. No gross abnormality to either hip. IMPRESSION: No acute bone abnormality in the pelvis. Electronically Signed   By: Markus Daft M.D.   On: 04/12/2022 10:28   DG Chest Port 1 View  Result Date: 04/12/2022 CLINICAL DATA:  Fall and weakness. EXAM: PORTABLE CHEST 1 VIEW COMPARISON:  Chest radiograph 11/24/2019 FINDINGS: Single view of the chest demonstrates clear lungs. Large bridging osteophytes in the thoracic spine. Heart and mediastinum are within normal limits. Negative for a pneumothorax. Trachea is midline. IMPRESSION: No active disease. Electronically Signed   By: Markus Daft M.D.   On: 04/12/2022 10:26      IMPRESSION:   3 weeks anorexia without nausea, vomiting, abdominal pain or other overt GI complaints.     Normocytic anemia.  Current Hgb 7.9 represents 3.5 g drop from 3 months ago.  Certainly her weakness, fatigue attributable to anemia but suspect this is multifactorial in setting of diminished p.o. intake.  No transfusions ordered.    FOBT positive without overt melena, bleeding per rectum, bloody/CGE emesis, abdominal pain, reflux symptoms.  Hx of recurrent adenomatous colon polyps, last BP with polypectomy and findings of nonbleeding cecal, ascending colon AVMs 2019.  Dr Carlean Purl has advised patient she does not need follow-up surveillance.  Minor elevation T. bili and AST.  Patient denies consumption of alcohol.  Hypokalemia.  Chronic Eliquis for history DVT, PAD, A-fib.  2022 non-small cell lung cancer obstructing right middle lobe, debulked at bronchoscopy and completed radiation.  Declined further surgical management.  Has follow-up with radiation MD Dr. Sondra Come in 07/2022.   Latest chest CT was 01/20/2022 showing significant reduction in size of right middle lobe mass, representing treatment response.  No change in subcentimeter RML nodule, likely benign/incidental.  History urothelial carcinoma in situ.  Resected 03/2020.  01/2021 L breast lumpectomy, intraductal papilloma  PLAN:       Begin w EGD tomorrow.  Clears today.  Await results of iron/anemia studies.     Azucena Freed  04/12/2022, 2:36 PM Phone [phone]  GI ATTENDING  History, laboratories, x-rays, prior endoscopy report reviewed.  Patient seen and examined.  Agree with comprehensive consultation note as outlined above.  We are asked to see the patient for symptomatic interval anemia.  Hemoccult positive stool.  She is on chronic anticoagulation.  Prior colonoscopy revealed right-sided AVMs.  This is the likely cause for chronic GI blood loss.  Plans for upper endoscopy to rule out upper GI mucosal lesions.  Anticoagulation has been held.  She has had no overt bleeding.  She should be transfused 2 an appropriate hemoglobin level to treat the symptoms.  She will need chronic iron therapy.  If she has refractory or recurrent  anemia, she may need colonoscopy with the intent of therapeutics.  At this point, she is a bit too frail for elective colonoscopy.  Upper endoscopy tomorrow morning.  Patient is HIGH RISK given her comorbidities, age and interruption of anticoagulation.  The nature of the procedure, as well as the risks, benefits, and alternatives were carefully and thoroughly reviewed with the patient. Ample time for discussion and questions allowed. The patient understood, was satisfied, and agreed to proceed.  Docia Chuck. Geri Seminole., M.D. [facility] Division of Gastroenterology

## 2022-04-12 NOTE — Plan of Care (Signed)
Poor appetite

## 2022-04-12 NOTE — ED Notes (Signed)
ED TO INPATIENT HANDOFF REPORT  ED Nurse Name and Phone #:   S Name/Age/Gender Tracy Davidson 81 y.o. female Room/Bed: 021C/021C  Code Status   Code Status: Full Code  Home/SNF/Other Home Patient oriented to: self, place, time, and situation Is this baseline? Yes   Triage Complete: Triage complete  Chief Complaint Hypokalemia [E87.6]  Triage Note Pt bib PTAR from home where her friend called for pt due to generalized weakness since Monday. Pt lives at home alone and said that she has been unable to get around and has been sitting in the chair for a couple days. Pt arrives AOx4 but is very soft spoken.  EMS vss Per EMS pt house is infested with insects. Pt arrives with multiple bugs crawling on her    Allergies Allergies  Allergen Reactions   Vibra-Tab [Doxycycline] Palpitations    Level of Care/Admitting Diagnosis ED Disposition     ED Disposition  Admit   Condition  --   Comment  Hospital Area: [facility] [[id number]]  Level of Care: Telemetry Medical [104]  May admit patient to [facility] or [facility] if equivalent level of care is available:: No  Covid Evaluation: Asymptomatic - no recent exposure (last 10 days) testing not required  Diagnosis: Hypokalemia [295284]  Admitting Physician: Lequita Halt [[id number]]  Attending Physician: Lequita Halt [[id number]]  Estimated length of stay: past midnight tomorrow  Certification:: I certify this patient will need inpatient services for at least 2 midnights          B Medical/Surgery History Past Medical History:  Diagnosis Date   ALLERGIC RHINITIS 09/20/2009   Arthritis    LEGS and left arm   Bladder tumor    Cataract    both eyes, right worse thsn left   COLONIC POLYPS, HX OF 06/06/2007   DYSLIPIDEMIA 09/20/2009   Dyspnea    sob with activity   GERD (gastroesophageal reflux disease)    History of radiation therapy    Right lung- 09/01/21-09/16/21- Dr. [name]   HYPERTENSION  06/06/2007   Knee pain    stiff and pain on side   LIVER FUNCTION TESTS, ABNORMAL, HX OF    feb or march 2019   Peripheral vascular disease ([place])    Shingles 11/2019   left arm and shoulder   Past Surgical History:  Procedure Laterality Date   ABDOMINAL AORTOGRAM W/LOWER EXTREMITY Bilateral 03/03/2020   Procedure: ABDOMINAL AORTOGRAM W/LOWER EXTREMITY;  Surgeon: Marty Heck, MD;  Location: [facility];  Service: Cardiovascular;  Laterality: Bilateral;   BREAST BIOPSY Left 02/02/2021   BREAST EXCISIONAL BIOPSY Left 1975   BRONCHIAL BIOPSY  06/14/2021   Procedure: BRONCHIAL BIOPSIES;  Surgeon: Garner Nash, DO;  Location: [facility];  Service: Pulmonary;;   BRONCHIAL BRUSHINGS  06/14/2021   Procedure: BRONCHIAL BRUSHINGS;  Surgeon: Garner Nash, DO;  Location: [facility];  Service: Pulmonary;;   BRONCHIAL NEEDLE ASPIRATION BIOPSY  06/14/2021   Procedure: BRONCHIAL NEEDLE ASPIRATION BIOPSIES;  Surgeon: Garner Nash, DO;  Location: [facility];  Service: Pulmonary;;   COLONOSCOPY  ?2015   CRYOTHERAPY  06/14/2021   Procedure: CRYOTHERAPY;  Surgeon: Garner Nash, DO;  Location: [facility];  Service: Pulmonary;;   CYSTOSCOPY WITH BIOPSY N/A 04/12/2020   Procedure: CYSTOSCOPY WITH BIOPSY  INSTILLATION OF GEMCITABINE;  Surgeon: Lucas Mallow, MD;  Location: [facility];  Service: Urology;  Laterality: N/A;   DILATION AND CURETTAGE OF UTERUS  yrs ago   HEMOSTASIS CONTROL  06/14/2021   Procedure: HEMOSTASIS CONTROL;  Surgeon: Garner Nash, DO;  Location: [facility];  Service: Pulmonary;;   PERIPHERAL VASCULAR INTERVENTION Right 03/03/2020   Procedure: PERIPHERAL VASCULAR INTERVENTION;  Surgeon: Marty Heck, MD;  Location: [facility];  Service: Cardiovascular;  Laterality: Right;   RADIOACTIVE SEED GUIDED EXCISIONAL BREAST BIOPSY Left 02/02/2021   Procedure: RADIOACTIVE SEED GUIDED EXCISIONAL LEFT BREAST BIOPSY;   Surgeon: Stark Klein, MD;  Location: [facility];  Service: General;  Laterality: Left;   [place]  yrs ago   complete   TRANSURETHRAL RESECTION OF BLADDER TUMOR N/A 09/18/2018   Procedure: RESTAGE TRANSURETHRAL RESECTION OF BLADDER TUMOR (TURBT);  Surgeon: Lucas Mallow, MD;  Location: [facility];  Service: Urology;  Laterality: N/A;   TRANSURETHRAL RESECTION OF BLADDER TUMOR WITH MITOMYCIN-C N/A 08/16/2018   Procedure: TRANSURETHRAL RESECTION OF BLADDER TUMOR;  Surgeon: Lucas Mallow, MD;  Location: [facility];  Service: Urology;  Laterality: N/A;   VIDEO BRONCHOSCOPY WITH ENDOBRONCHIAL ULTRASOUND N/A 06/14/2021   Procedure: VIDEO BRONCHOSCOPY WITH ENDOBRONCHIAL ULTRASOUND;  Surgeon: Garner Nash, DO;  Location: [facility];  Service: Pulmonary;  Laterality: N/A;     A IV Location/Drains/Wounds Patient Lines/Drains/Airways Status     Active Line/Drains/Airways     Name Placement date Placement time Site Days   Peripheral IV 06/14/21 18 G Distal;Left;Posterior;Lateral Forearm 06/14/21  0935  Forearm  302   Peripheral IV 04/12/22 20 G Right Antecubital 04/12/22  1013  Antecubital  less than 1   Peripheral IV 04/12/22 20 G Left Antecubital 04/12/22  1013  Antecubital  less than 1   Incision (Closed) 08/16/18 Perineum 08/16/18  1510  -- 1335   Incision (Closed) 09/18/18 Perineum 09/18/18  1055  -- 1302   Incision (Closed) 04/12/20 Perineum 04/12/20  1205  -- 730   Incision (Closed) 02/02/21 Breast Left 02/02/21  1231  -- 434            Intake/Output Last 24 hours No intake or output data in the 24 hours ending 04/12/22 1609  Labs/Imaging Results for orders placed or performed during the hospital encounter of 04/12/22 (from the past 48 hour(s))  CBC with Differential     Status: Abnormal   Collection Time: 04/12/22  9:18 AM  Result Value Ref Range   WBC 13.6 (H) 4.0 - 10.5  K/uL   RBC 2.90 (L) 3.87 - 5.11 MIL/uL   Hemoglobin 7.9 (L) 12.0 - 15.0 g/dL   HCT 26.5 (L) 36.0 - 46.0 %   MCV 91.4 80.0 - 100.0 fL   MCH 27.2 26.0 - 34.0 pg   MCHC 29.8 (L) 30.0 - 36.0 g/dL   RDW 15.9 (H) 11.5 - 15.5 %   Platelets 130 (L) 150 - 400 K/uL   nRBC 1.0 (H) 0.0 - 0.2 %   Neutrophils Relative % 91 %   Neutro Abs 12.5 (H) 1.7 - 7.7 K/uL   Lymphocytes Relative 4 %   Lymphs Abs 0.5 (L) 0.7 - 4.0 K/uL   Monocytes Relative 3 %   Monocytes Absolute 0.4 0.1 - 1.0 K/uL   Eosinophils Relative 0 %   Eosinophils Absolute 0.0 0.0 - 0.5 K/uL   Basophils Relative 0 %   Basophils Absolute 0.0 0.0 - 0.1 K/uL   Immature Granulocytes 2 %   Abs Immature Granulocytes 0.23 (H) 0.00 - 0.07  K/uL    Comment: Performed at [facility], [place] [address], [place], [place] [address]  Comprehensive metabolic panel     Status: Abnormal   Collection Time: 04/12/22  9:18 AM  Result Value Ref Range   Sodium 144 135 - 145 mmol/L   Potassium 2.1 (LL) 3.5 - 5.1 mmol/L    Comment: CRITICAL RESULT CALLED TO, READ BACK BY AND VERIFIED WITH: C.Normal Recinos,RN 1036 04/12/22 CLARK,S    Chloride 98 98 - 111 mmol/L   CO2 32 22 - 32 mmol/L   Glucose, Bld 164 (H) 70 - 99 mg/dL    Comment: Glucose reference range applies only to samples taken after fasting for at least 8 hours.   BUN 15 8 - 23 mg/dL   Creatinine, Ser 0.75 0.44 - 1.00 mg/dL   Calcium 12.1 (H) 8.9 - 10.3 mg/dL   Total Protein 6.2 (L) 6.5 - 8.1 g/dL   Albumin 3.5 3.5 - 5.0 g/dL   AST 55 (H) 15 - 41 U/L   ALT 24 0 - 44 U/L   Alkaline Phosphatase 47 38 - 126 U/L   Total Bilirubin 1.6 (H) 0.3 - 1.2 mg/dL   GFR, Estimated >60 >60 mL/min    Comment: (NOTE) Calculated using the CKD-EPI Creatinine Equation (2021)    Anion gap 14 5 - 15    Comment: Performed at [facility], [place] [address], [place], [place] [address]  CK     Status: None   Collection Time: 04/12/22  9:18 AM  Result Value Ref Range   Total CK 49 38 - 234 U/L    Comment:  Performed at [facility], [place] [address] [place], [place] [address]  ABO/Rh     Status: None   Collection Time: 04/12/22  9:18 AM  Result Value Ref Range   ABO/RH(D)      A POS Performed at [facility] [address] [place], [place] [address]   Magnesium     Status: Abnormal   Collection Time: 04/12/22  9:18 AM  Result Value Ref Range   Magnesium 1.3 (L) 1.7 - 2.4 mg/dL    Comment: Performed at [facility] [address], [place], [place] [address]  TSH     Status: None   Collection Time: 04/12/22  9:18 AM  Result Value Ref Range   TSH 1.026 0.350 - 4.500 uIU/mL    Comment: Performed by a 3rd Generation assay with a functional sensitivity of <=0.01 uIU/mL. Performed at [facility], [place] [address], [place], [place] [address]   POC occult blood, ED Provider will collect     Status: Abnormal   Collection Time: 04/12/22  9:57 AM  Result Value Ref Range   Fecal Occult Bld POSITIVE (A) NEGATIVE  Type and screen     Status: None   Collection Time: 04/12/22 11:12 AM  Result Value Ref Range   ABO/RH(D) A POS    Antibody Screen NEG    Sample Expiration      04/15/2022,2359 Performed at [facility], [place] [address], [place], [place] [address]   Protime-INR     Status: Abnormal   Collection Time: 04/12/22 11:12 AM  Result Value Ref Range   Prothrombin Time 18.2 (H) 11.4 - 15.2 seconds   INR 1.5 (H) 0.8 - 1.2    Comment: (NOTE) INR goal varies based on device and disease states. Performed at [facility], [place]  [address], [place], [place] [address]   APTT     Status: Abnormal   Collection Time: 04/12/22 11:12 AM  Result Value Ref Range   aPTT 23 (L) 24 - 36 seconds    Comment: Performed at [facility], [place] [address], [place], [place] [address]   CT HEAD WO CONTRAST (5MM)  Result Date: 04/12/2022 CLINICAL DATA:  Head trauma, minor (Age >= 65y); Neck trauma (Age >= 65y) EXAM: CT HEAD WITHOUT CONTRAST CT CERVICAL SPINE WITHOUT  CONTRAST TECHNIQUE: Multidetector CT imaging of the head and cervical spine was performed following the standard protocol without intravenous contrast. Multiplanar CT image reconstructions of the cervical spine were also generated. RADIATION DOSE REDUCTION: This exam was performed according to the departmental dose-optimization program which includes automated exposure control, adjustment of the mA and/or kV according to patient size and/or use of iterative reconstruction technique. COMPARISON:  None Available. FINDINGS: CT HEAD FINDINGS Brain: No evidence of acute infarction, hemorrhage, hydrocephalus, extra-axial collection or mass lesion/mass effect. Patchy white matter hypoattenuation nonspecific but compatible with chronic microvascular ischemic disease. Vascular: Calcific intracranial atherosclerosis. Skull: No acute fracture. Sinuses/Orbits: Clear sinuses.  No acute orbital findings. Other: No mastoid effusions. CT CERVICAL SPINE FINDINGS Alignment: No substantial sagittal subluxation. Skull base and vertebrae: Vertebral body heights are maintained. No evidence of acute fracture. Soft tissues and spinal canal: No prevertebral fluid or swelling. No visible canal hematoma. Disc levels: Multilevel facet and uncovertebral hypertrophy contributes to varying degrees of neural foraminal stenosis. Bridging osteophytes and endplate spurring at multiple levels. Upper chest: Visualized lung apices are clear. IMPRESSION: 1. No evidence of acute intracranial abnormality. 2. No evidence of acute fracture or traumatic malalignment in the cervical spine. Electronically Signed   By: Margaretha Sheffield M.D.   On: 04/12/2022 10:44   CT Cervical Spine Wo Contrast  Result Date: 04/12/2022 CLINICAL DATA:  Head trauma, minor (Age >= 65y); Neck trauma (Age >= 65y) EXAM: CT HEAD WITHOUT CONTRAST CT CERVICAL SPINE WITHOUT CONTRAST TECHNIQUE: Multidetector CT imaging of the head and cervical spine was performed following the  standard protocol without intravenous contrast. Multiplanar CT image reconstructions of the cervical spine were also generated. RADIATION DOSE REDUCTION: This exam was performed according to the departmental dose-optimization program which includes automated exposure control, adjustment of the mA and/or kV according to patient size and/or use of iterative reconstruction technique. COMPARISON:  None Available. FINDINGS: CT HEAD FINDINGS Brain: No evidence of acute infarction, hemorrhage, hydrocephalus, extra-axial collection or mass lesion/mass effect. Patchy white matter hypoattenuation nonspecific but compatible with chronic microvascular ischemic disease. Vascular: Calcific intracranial atherosclerosis. Skull: No acute fracture. Sinuses/Orbits: Clear sinuses.  No acute orbital findings. Other: No mastoid effusions. CT CERVICAL SPINE FINDINGS Alignment: No substantial sagittal subluxation. Skull base and vertebrae: Vertebral body heights are maintained. No evidence of acute fracture. Soft tissues and spinal canal: No prevertebral fluid or swelling. No visible canal hematoma. Disc levels: Multilevel facet and uncovertebral hypertrophy contributes to varying degrees of neural foraminal stenosis. Bridging osteophytes and endplate spurring at multiple levels. Upper chest: Visualized lung apices are clear. IMPRESSION: 1. No evidence of acute intracranial abnormality. 2. No evidence of acute fracture or traumatic malalignment in the cervical spine. Electronically Signed   By: Margaretha Sheffield M.D.   On: 04/12/2022 10:44   DG Pelvis Portable  Result Date: 04/12/2022 CLINICAL DATA:  Fall and weakness. EXAM: PORTABLE PELVIS 1-2 VIEWS COMPARISON:  Lumbar spine 03/01/2022 FINDINGS: Pelvic bony ring is intact. Degenerative changes in the lower  lumbar spine. Again noted is a right iliac vascular stent. Large amount stool in the rectum. Degenerative changes along the lateral aspect of the acetabula bilaterally. No gross  abnormality to either hip. IMPRESSION: No acute bone abnormality in the pelvis. Electronically Signed   By: Markus Daft M.D.   On: 04/12/2022 10:28   DG Chest Port 1 View  Result Date: 04/12/2022 CLINICAL DATA:  Fall and weakness. EXAM: PORTABLE CHEST 1 VIEW COMPARISON:  Chest radiograph 11/24/2019 FINDINGS: Single view of the chest demonstrates clear lungs. Large bridging osteophytes in the thoracic spine. Heart and mediastinum are within normal limits. Negative for a pneumothorax. Trachea is midline. IMPRESSION: No active disease. Electronically Signed   By: Markus Daft M.D.   On: 04/12/2022 10:26    Pending Labs Unresulted Labs (From admission, onward)     Start     Ordered   04/13/22 5188  Basic metabolic panel  Tomorrow morning,   R        04/12/22 1206   04/13/22 0500  CBC  Tomorrow morning,   R        04/12/22 1208   04/12/22 2000  Hemoglobin and hematocrit, blood  Once-Timed,   TIMED        04/12/22 1208   04/12/22 1500  Potassium  Once-Timed,   STAT        04/12/22 1202   04/12/22 1321  Reticulocytes  Once,   R        04/12/22 1320   04/12/22 1320  Iron and TIBC  Add-on,   AD        04/12/22 1319   04/12/22 1320  Ferritin  Add-on,   AD        04/12/22 1319   04/12/22 1316  Phosphorus  Add-on,   AD        04/12/22 1315   04/12/22 0922  Urinalysis, Routine w reflex microscopic  Once,   URGENT        04/12/22 0923   04/12/22 0922  Urine Culture  Once,   URGENT       Question:  Indication  Answer:  Sepsis   04/12/22 0923            Vitals/Pain Today's Vitals   04/12/22 1500 04/12/22 1515 04/12/22 1530 04/12/22 1545  BP: (!) 147/86 (!) 154/84 (!) 149/90 (!) 152/91  Pulse: 93 95 96 94  Resp: 16 16 16 15   Temp:      TempSrc:      SpO2: 98% 100% 98% 99%  PainSc:        Isolation Precautions No active isolations  Medications Medications  feeding supplement (ENSURE ENLIVE / ENSURE PLUS) liquid 237 mL (has no administration in time range)  dronabinol (MARINOL)  capsule 2.5 mg (has no administration in time range)  potassium chloride SA (KLOR-CON M) CR tablet 40 mEq (40 mEq Oral Not Given 04/12/22 1330)  acetaminophen (TYLENOL) tablet 1,000 mg (has no administration in time range)  atorvastatin (LIPITOR) tablet 10 mg (10 mg Oral Given 04/12/22 1313)  melatonin tablet 5 mg (has no administration in time range)  lisinopril (ZESTRIL) tablet 20 mg (20 mg Oral Given 04/12/22 1306)    And  hydrochlorothiazide (HYDRODIURIL) tablet 25 mg (25 mg Oral Given 04/12/22 1306)  potassium chloride 10 mEq in 100 mL IVPB (10 mEq Intravenous New Bag/Given 04/12/22 1606)  enoxaparin (LOVENOX) injection 40 mg (has no administration in time range)  potassium chloride 10 mEq in 100 mL IVPB (  0 mEq Intravenous Stopped 04/12/22 1330)  magnesium sulfate IVPB 2 g 50 mL (0 g Intravenous Stopped 04/12/22 1257)  sodium chloride 0.9 % 1,000 mL with M.V.I. Adult (INFUVITE ADULT) 10 mL infusion ( Intravenous Stopped 04/12/22 1543)    Mobility non-ambulatory Moderate fall risk   Focused Assessments Neuro Assessment Handoff:  Swallow screen pass? Yes          Neuro Assessment:   Neuro Checks:      Last Documented NIHSS Modified Score:   Has TPA been given? No If patient is a Neuro Trauma and patient is going to OR before floor call report to [facility] nurse: [phone] or [phone]  , GI- EGD planned for tomorrow-- on clear liquids today   R Recommendations: See Admitting Provider Note  Report given to:   Additional Notes:

## 2022-04-12 NOTE — ED Triage Notes (Signed)
Pt bib PTAR from home where her friend called for pt due to generalized weakness since Monday. Pt lives at home alone and said that she has been unable to get around and has been sitting in the chair for a couple days. Pt arrives AOx4 but is very soft spoken.  EMS vss Per EMS pt house is infested with insects. Pt arrives with multiple bugs crawling on her

## 2022-04-12 NOTE — ED Provider Notes (Signed)
[facility] EMERGENCY DEPARTMENT Provider Note   CSN: [id number] Arrival date & time: 04/12/22  4656     History  Chief Complaint  Patient presents with   Weakness    Tracy ARVANITIS is a 81 y.o. female.  The history is provided by the patient and medical records.   81 year old female with history of DVT on Eliquis, dyslipidemia, hypertension, peripheral arterial disease, presenting to the ED with generalized weakness.  Per EMS, neighbor called as patient has been unable to get out of of chair at home since Monday.  Patient does report a fall on Monday but denies any injuries from this.  She denies any head injury or loss of consciousness.  States for some reason she just "could not stand up".  She does report using cane/walker at home when up and about. She is on eliquis.    Home Medications Prior to Admission medications   Medication Sig Start Date End Date Taking? Authorizing Provider  atorvastatin (LIPITOR) 10 MG tablet TAKE 1 TABLET BY MOUTH EVERY DAY 03/31/22   Waynetta Sandy, MD  calcium carbonate (OSCAL) 1500 (600 Ca) MG TABS tablet Take 1,500 mg by mouth 2 (two) times daily with a meal.    [provider]  Cholecalciferol (VITAMIN D) 50 MCG (2000 UT) CAPS Take 5,000 Units by mouth daily.     [provider]  ELIQUIS 5 MG TABS tablet TAKE 1 TABLET BY MOUTH TWICE A DAY 12/19/21   Billie Ruddy, MD  KLOR-CON M20 20 MEQ tablet TAKE 1 TABLET BY MOUTH EVERY DAY 04/10/22   Billie Ruddy, MD  lisinopril-hydrochlorothiazide (ZESTORETIC) 20-25 MG tablet TAKE 1 TABLET BY MOUTH EVERY DAY 02/03/22   Billie Ruddy, MD  Melatonin 5 MG CAPS Take 5 mg by mouth at bedtime as needed (sleep).    [provider]  nicotine polacrilex (COMMIT) 2 MG lozenge Take 2 mg by mouth as needed for smoking cessation. Patient not taking: Reported on 03/21/2022    [provider]  omeprazole (PRILOSEC) 20 MG capsule TAKE 1 CAPSULE BY MOUTH  EVERY DAY 04/04/22   Billie Ruddy, MD  predniSONE (DELTASONE) 20 MG tablet PLEASE SEE ATTACHED FOR DETAILED DIRECTIONS Patient not taking: Reported on 03/21/2022 11/03/21   [provider]  triamcinolone ointment (KENALOG) 0.1 % Apply 1 application topically 2 (two) times daily.    [provider]      Allergies    Doxycycline    Review of Systems   Review of Systems  Neurological:  Positive for weakness.  All other systems reviewed and are negative.   Physical Exam Updated Vital Signs BP (!) 145/70   Pulse (!) 101   Temp 98 F (36.7 C) (Oral)   Resp (!) 21   SpO2 100%   Physical Exam Vitals and nursing note reviewed.  Constitutional:      Appearance: She is well-developed.     Comments: Bedbugs seen crawling on pants, smells of urine  HENT:     Head: Normocephalic and atraumatic.  Eyes:     Conjunctiva/sclera: Conjunctivae normal.     Pupils: Pupils are equal, round, and reactive to light.  Cardiovascular:     Rate and Rhythm: Normal rate and regular rhythm.     Heart sounds: Normal heart sounds.  Pulmonary:     Effort: Pulmonary effort is normal.     Breath sounds: Normal breath sounds.  Abdominal:     General: Bowel sounds are  normal.     Palpations: Abdomen is soft.  Genitourinary:    Comments: Exam chaperoned by RN Owens Shark stool noted on DRE, no gross blood, hemoccult + Musculoskeletal:        General: Normal range of motion.     Cervical back: Normal range of motion.     Comments: Pelvis stable, non-tender, no leg shortening DP pulses intact BLE  Skin:    General: Skin is warm and dry.  Neurological:     Mental Status: She is alert and oriented to person, place, and time.     ED Results / Procedures / Treatments   Labs (all labs ordered are listed, but only abnormal results are displayed) Labs Reviewed  CBC WITH DIFFERENTIAL/PLATELET - Abnormal; Notable for the following components:      Result Value   WBC 13.6 (*)    RBC 2.90 (*)     Hemoglobin 7.9 (*)    HCT 26.5 (*)    MCHC 29.8 (*)    RDW 15.9 (*)    Platelets 130 (*)    nRBC 1.0 (*)    Neutro Abs 12.5 (*)    Lymphs Abs 0.5 (*)    Abs Immature Granulocytes 0.23 (*)    All other components within normal limits  COMPREHENSIVE METABOLIC PANEL - Abnormal; Notable for the following components:   Potassium 2.1 (*)    Glucose, Bld 164 (*)    Calcium 12.1 (*)    Total Protein 6.2 (*)    AST 55 (*)    Total Bilirubin 1.6 (*)    All other components within normal limits  PROTIME-INR - Abnormal; Notable for the following components:   Prothrombin Time 18.2 (*)    INR 1.5 (*)    All other components within normal limits  APTT - Abnormal; Notable for the following components:   aPTT 23 (*)    All other components within normal limits  MAGNESIUM - Abnormal; Notable for the following components:   Magnesium 1.3 (*)    All other components within normal limits  POC OCCULT BLOOD, ED - Abnormal; Notable for the following components:   Fecal Occult Bld POSITIVE (*)    All other components within normal limits  URINE CULTURE  CK  URINALYSIS, ROUTINE W REFLEX MICROSCOPIC  POTASSIUM  TSH  HEMOGLOBIN AND HEMATOCRIT, BLOOD  TYPE AND SCREEN  ABO/RH    EKG None  Radiology CT HEAD WO CONTRAST (5MM)  Result Date: 04/12/2022 CLINICAL DATA:  Head trauma, minor (Age >= 65y); Neck trauma (Age >= 65y) EXAM: CT HEAD WITHOUT CONTRAST CT CERVICAL SPINE WITHOUT CONTRAST TECHNIQUE: Multidetector CT imaging of the head and cervical spine was performed following the standard protocol without intravenous contrast. Multiplanar CT image reconstructions of the cervical spine were also generated. RADIATION DOSE REDUCTION: This exam was performed according to the departmental dose-optimization program which includes automated exposure control, adjustment of the mA and/or kV according to patient size and/or use of iterative reconstruction technique. COMPARISON:  None Available. FINDINGS:  CT HEAD FINDINGS Brain: No evidence of acute infarction, hemorrhage, hydrocephalus, extra-axial collection or mass lesion/mass effect. Patchy white matter hypoattenuation nonspecific but compatible with chronic microvascular ischemic disease. Vascular: Calcific intracranial atherosclerosis. Skull: No acute fracture. Sinuses/Orbits: Clear sinuses.  No acute orbital findings. Other: No mastoid effusions. CT CERVICAL SPINE FINDINGS Alignment: No substantial sagittal subluxation. Skull base and vertebrae: Vertebral body heights are maintained. No evidence of acute fracture. Soft tissues and spinal canal: No prevertebral fluid or swelling. No  visible canal hematoma. Disc levels: Multilevel facet and uncovertebral hypertrophy contributes to varying degrees of neural foraminal stenosis. Bridging osteophytes and endplate spurring at multiple levels. Upper chest: Visualized lung apices are clear. IMPRESSION: 1. No evidence of acute intracranial abnormality. 2. No evidence of acute fracture or traumatic malalignment in the cervical spine. Electronically Signed   By: Margaretha Sheffield M.D.   On: 04/12/2022 10:44   CT Cervical Spine Wo Contrast  Result Date: 04/12/2022 CLINICAL DATA:  Head trauma, minor (Age >= 65y); Neck trauma (Age >= 65y) EXAM: CT HEAD WITHOUT CONTRAST CT CERVICAL SPINE WITHOUT CONTRAST TECHNIQUE: Multidetector CT imaging of the head and cervical spine was performed following the standard protocol without intravenous contrast. Multiplanar CT image reconstructions of the cervical spine were also generated. RADIATION DOSE REDUCTION: This exam was performed according to the departmental dose-optimization program which includes automated exposure control, adjustment of the mA and/or kV according to patient size and/or use of iterative reconstruction technique. COMPARISON:  None Available. FINDINGS: CT HEAD FINDINGS Brain: No evidence of acute infarction, hemorrhage, hydrocephalus, extra-axial collection or  mass lesion/mass effect. Patchy white matter hypoattenuation nonspecific but compatible with chronic microvascular ischemic disease. Vascular: Calcific intracranial atherosclerosis. Skull: No acute fracture. Sinuses/Orbits: Clear sinuses.  No acute orbital findings. Other: No mastoid effusions. CT CERVICAL SPINE FINDINGS Alignment: No substantial sagittal subluxation. Skull base and vertebrae: Vertebral body heights are maintained. No evidence of acute fracture. Soft tissues and spinal canal: No prevertebral fluid or swelling. No visible canal hematoma. Disc levels: Multilevel facet and uncovertebral hypertrophy contributes to varying degrees of neural foraminal stenosis. Bridging osteophytes and endplate spurring at multiple levels. Upper chest: Visualized lung apices are clear. IMPRESSION: 1. No evidence of acute intracranial abnormality. 2. No evidence of acute fracture or traumatic malalignment in the cervical spine. Electronically Signed   By: Margaretha Sheffield M.D.   On: 04/12/2022 10:44   DG Pelvis Portable  Result Date: 04/12/2022 CLINICAL DATA:  Fall and weakness. EXAM: PORTABLE PELVIS 1-2 VIEWS COMPARISON:  Lumbar spine 03/01/2022 FINDINGS: Pelvic bony ring is intact. Degenerative changes in the lower lumbar spine. Again noted is a right iliac vascular stent. Large amount stool in the rectum. Degenerative changes along the lateral aspect of the acetabula bilaterally. No gross abnormality to either hip. IMPRESSION: No acute bone abnormality in the pelvis. Electronically Signed   By: Markus Daft M.D.   On: 04/12/2022 10:28   DG Chest Port 1 View  Result Date: 04/12/2022 CLINICAL DATA:  Fall and weakness. EXAM: PORTABLE CHEST 1 VIEW COMPARISON:  Chest radiograph 11/24/2019 FINDINGS: Single view of the chest demonstrates clear lungs. Large bridging osteophytes in the thoracic spine. Heart and mediastinum are within normal limits. Negative for a pneumothorax. Trachea is midline. IMPRESSION: No active  disease. Electronically Signed   By: Markus Daft M.D.   On: 04/12/2022 10:26    Procedures Procedures    CRITICAL CARE Performed by: Larene Pickett   Total critical care time: 45 minutes  Critical care time was exclusive of separately billable procedures and treating other patients.  Critical care was necessary to treat or prevent imminent or life-threatening deterioration.  Critical care was time spent personally by me on the following activities: development of treatment plan with patient and/or surrogate as well as nursing, discussions with consultants, evaluation of patient's response to treatment, examination of patient, obtaining history from patient or surrogate, ordering and performing treatments and interventions, ordering and review of laboratory studies, ordering and review of radiographic  studies, pulse oximetry and re-evaluation of patient's condition.   Medications Ordered in ED Medications  feeding supplement (ENSURE ENLIVE / ENSURE PLUS) liquid 237 mL (has no administration in time range)  dronabinol (MARINOL) capsule 2.5 mg (has no administration in time range)  potassium chloride SA (KLOR-CON M) CR tablet 40 mEq (40 mEq Oral Given 04/12/22 1253)  acetaminophen (TYLENOL) tablet 1,000 mg (has no administration in time range)  atorvastatin (LIPITOR) tablet 10 mg (has no administration in time range)  0.9 %  sodium chloride infusion ( Intravenous New Bag/Given 04/12/22 1218)  pantoprazole (PROTONIX) EC tablet 40 mg (has no administration in time range)  melatonin tablet 5 mg (has no administration in time range)  enoxaparin (LOVENOX) injection 40 mg (has no administration in time range)  lisinopril (ZESTRIL) tablet 20 mg (has no administration in time range)    And  hydrochlorothiazide (HYDRODIURIL) tablet 25 mg (has no administration in time range)  potassium chloride 10 mEq in 100 mL IVPB (0 mEq Intravenous Stopped 04/12/22 1219)  magnesium sulfate IVPB 2 g 50 mL (0 g  Intravenous Stopped 04/12/22 1257)    ED Course/ Medical Decision Making/ A&P                           Medical Decision Making Amount and/or Complexity of Data Reviewed Labs: ordered. Radiology: ordered and independent interpretation performed. ECG/medicine tests: ordered and independent interpretation performed.  Risk Prescription drug management. Decision regarding hospitalization.   81 year old female presenting to the ED with generalized weakness.  Has been sitting in chair at home since Monday, unable to get up on her own.  She does report a fall on Monday but denies any injuries.  She is on Eliquis, denies any head injury or loss of consciousness.  She is awake, alert, oriented here.  She is able to answer questions and follow commands appropriately.  She has no acute complaints other than feeling weak.  Vitals are stable on room air.  Labs obtained, hemoglobin today is 7.9, down from 11.4 in March. No gross blood on DRE but hemoccult +.  Will send type and screen, coags.  She is on eliquis chronically.  Last colonscopy 2019 with Dr. Carlean Purl, polyps and diverticula found.  Suspect likely diverticular bleed.  K+ also critically low at 2.1.  normal SrCr.  CK normal at 49.  Mg+ low at 1.3.  given IV K+ and Mg+ replacement.  Head/c-spine CT's reviewed, no acute findings.  Chest and pelvis films also normal.  Will discuss with GI and admit to medicine.  Spoke with Nevin Bloodgood with  GI-- will consult.    Discussed with hospitalist, Dr. Roosevelt Locks.  Will admit for ongoing care.  Final Clinical Impression(s) / ED Diagnoses Final diagnoses:  Gastrointestinal hemorrhage, unspecified gastrointestinal hemorrhage type  Hypokalemia  Generalized weakness    Rx / DC Orders ED Discharge Orders     None         Larene Pickett, PA-C 04/12/22 1300    Carmin Muskrat, MD 04/12/22 [phone]

## 2022-04-12 NOTE — ED Notes (Signed)
Pt belongings taken off and bagged at the bedside. Pt wiped down and bedding changed

## 2022-04-13 ENCOUNTER — Encounter (HOSPITAL_COMMUNITY): Payer: Self-pay | Admitting: Internal Medicine

## 2022-04-13 ENCOUNTER — Encounter (HOSPITAL_COMMUNITY)
Admission: EM | Disposition: A | Discharge status: Skilled Nursing Facility | Payer: Self-pay | Source: Home / Self Care | Attending: Internal Medicine

## 2022-04-13 ENCOUNTER — Inpatient Hospital Stay (HOSPITAL_COMMUNITY): Payer: Medicare Other | Admitting: Anesthesiology

## 2022-04-13 DIAGNOSIS — I10 Essential (primary) hypertension: Secondary | ICD-10-CM | POA: Diagnosis not present

## 2022-04-13 DIAGNOSIS — K922 Gastrointestinal hemorrhage, unspecified: Secondary | ICD-10-CM | POA: Diagnosis not present

## 2022-04-13 DIAGNOSIS — M1990 Unspecified osteoarthritis, unspecified site: Secondary | ICD-10-CM | POA: Diagnosis not present

## 2022-04-13 DIAGNOSIS — D649 Anemia, unspecified: Secondary | ICD-10-CM

## 2022-04-13 DIAGNOSIS — R195 Other fecal abnormalities: Secondary | ICD-10-CM

## 2022-04-13 DIAGNOSIS — Z87891 Personal history of nicotine dependence: Secondary | ICD-10-CM | POA: Diagnosis not present

## 2022-04-13 DIAGNOSIS — R531 Weakness: Secondary | ICD-10-CM

## 2022-04-13 DIAGNOSIS — D500 Iron deficiency anemia secondary to blood loss (chronic): Secondary | ICD-10-CM | POA: Diagnosis not present

## 2022-04-13 DIAGNOSIS — E876 Hypokalemia: Secondary | ICD-10-CM | POA: Diagnosis not present

## 2022-04-13 HISTORY — PX: ESOPHAGOGASTRODUODENOSCOPY (EGD) WITH PROPOFOL: SHX5813

## 2022-04-13 LAB — CBC
HCT: 21.1 % — ABNORMAL LOW (ref 36.0–46.0)
HCT: 28.3 % — ABNORMAL LOW (ref 36.0–46.0)
Hemoglobin: 6.6 g/dL — CL (ref 12.0–15.0)
Hemoglobin: 8.8 g/dL — ABNORMAL LOW (ref 12.0–15.0)
MCH: 28.2 pg (ref 26.0–34.0)
MCH: 26.5 pg (ref 26.0–34.0)
MCHC: 31.3 g/dL (ref 30.0–36.0)
MCHC: 31.1 g/dL (ref 30.0–36.0)
MCV: 90.2 fL (ref 80.0–100.0)
MCV: 85.2 fL (ref 80.0–100.0)
Platelets: 84 10*3/uL — ABNORMAL LOW (ref 150–400)
Platelets: 116 10*3/uL — ABNORMAL LOW (ref 150–400)
RBC: 2.34 MIL/uL — ABNORMAL LOW (ref 3.87–5.11)
RBC: 3.32 MIL/uL — ABNORMAL LOW (ref 3.87–5.11)
RDW: 16.1 % — ABNORMAL HIGH (ref 11.5–15.5)
RDW: 17.7 % — ABNORMAL HIGH (ref 11.5–15.5)
WBC: 11.3 10*3/uL — ABNORMAL HIGH (ref 4.0–10.5)
WBC: 12.8 10*3/uL — ABNORMAL HIGH (ref 4.0–10.5)
nRBC: 0.6 % — ABNORMAL HIGH (ref 0.0–0.2)
nRBC: 0.5 % — ABNORMAL HIGH (ref 0.0–0.2)

## 2022-04-13 LAB — PREPARE RBC (CROSSMATCH)

## 2022-04-13 LAB — BASIC METABOLIC PANEL
Anion gap: 13 (ref 5–15)
BUN: 14 mg/dL (ref 8–23)
CO2: 29 mmol/L (ref 22–32)
Calcium: 11.3 mg/dL — ABNORMAL HIGH (ref 8.9–10.3)
Chloride: 102 mmol/L (ref 98–111)
Creatinine, Ser: 0.57 mg/dL (ref 0.44–1.00)
GFR, Estimated: >60 mL/min (ref >60)
Glucose, Bld: 132 mg/dL — ABNORMAL HIGH (ref 70–99)
Potassium: 3.4 mmol/L — ABNORMAL LOW (ref 3.5–5.1)
Sodium: 144 mmol/L (ref 135–145)

## 2022-04-13 LAB — MAGNESIUM: Magnesium: 1.6 mg/dL — ABNORMAL LOW (ref 1.7–2.4)

## 2022-04-13 SURGERY — ESOPHAGOGASTRODUODENOSCOPY (EGD) WITH PROPOFOL
Anesthesia: Monitor Anesthesia Care

## 2022-04-13 MED ORDER — MAGNESIUM SULFATE 4 GM/100ML IV SOLN
4.0000 g | Freq: Once | INTRAVENOUS | Status: AC
Start: 2022-04-13 — End: 2022-04-13
  Administered 2022-04-13: 4 g via INTRAVENOUS
  Filled 2022-04-13: qty 100

## 2022-04-13 MED ORDER — LACTATED RINGERS IV SOLN: INTRAVENOUS | Status: DC

## 2022-04-13 MED ORDER — PROPOFOL 10 MG/ML IV BOLUS
INTRAVENOUS | Status: DC | PRN
  Administered 2022-04-13: 20 mg via INTRAVENOUS

## 2022-04-13 MED ORDER — POTASSIUM CHLORIDE CRYS ER 20 MEQ PO TBCR
60.0000 meq | EXTENDED_RELEASE_TABLET | Freq: Once | ORAL | Status: AC
  Administered 2022-04-13: 60 meq via ORAL
  Filled 2022-04-13: qty 3

## 2022-04-13 MED ORDER — PROPOFOL 500 MG/50ML IV EMUL
INTRAVENOUS | Status: DC | PRN
  Administered 2022-04-13: 100 ug/kg/min via INTRAVENOUS

## 2022-04-13 MED ORDER — SODIUM CHLORIDE 0.9% IV SOLUTION: Freq: Once | INTRAVENOUS | Status: AC

## 2022-04-13 MED ORDER — APIXABAN 2.5 MG PO TABS
2.5000 mg | ORAL_TABLET | Freq: Two times a day (BID) | ORAL | Status: DC
  Administered 2022-04-13 – 2022-04-20 (×14): 2.5 mg via ORAL
  Filled 2022-04-13 (×14): qty 1

## 2022-04-13 MED ORDER — LACTATED RINGERS IV SOLN
INTRAVENOUS | Status: AC | PRN
  Administered 2022-04-13: 500 mL via INTRAVENOUS

## 2022-04-13 SURGICAL SUPPLY — 15 items

## 2022-04-13 NOTE — Op Note (Signed)
[facility] Patient Name: Tracy Davidson Procedure Date : 04/13/2022 MRN: [id number] Attending MD: Docia Chuck. Henrene Pastor , MD Date of Birth: 03-Nov-1940 CSN: [id number] Age: 81 Admit Type: Inpatient Procedure:                Upper GI endoscopy Indications:              Iron deficiency anemia secondary to chronic blood                            loss Providers:                Docia Chuck. Henrene Pastor, MD, Grace Isaac, RN, [facility]                            Technician, Technician Referring MD:             [facility] hospitalist Medicines:                Monitored Anesthesia Care Complications:            No immediate complications. Estimated Blood Loss:     Estimated blood loss: none. Procedure:                Pre-Anesthesia Assessment:                           - Prior to the procedure, a History and Physical                            was performed, and patient medications and                            allergies were reviewed. The patient's tolerance of                            previous anesthesia was also reviewed. The risks                            and benefits of the procedure and the sedation                            options and risks were discussed with the patient.                            All questions were answered, and informed consent                            was obtained. Prior Anticoagulants: The patient has                            taken Eliquis (apixaban), last dose was 2 days                            prior to procedure. ASA Grade Assessment: III - A  patient with severe systemic disease. After                            reviewing the risks and benefits, the patient was                            deemed in satisfactory condition to undergo the                            procedure.                           After obtaining informed consent, the endoscope was                            passed under direct vision. Throughout the                             procedure, the patient's blood pressure, pulse, and                            oxygen saturations were monitored continuously. The                            [id number]-[id number] ([id number] Olympus endoscope was introduced                            through the mouth, and advanced to the third part                            of duodenum. The upper GI endoscopy was                            accomplished without difficulty. The patient                            tolerated the procedure well. Scope In: Scope Out: Findings:      The esophagus was normal.      The stomach small submucosal nodule in the gastric antrum but was       otherwise normal.      The examined duodenum was normal.      The cardia and gastric fundus were normal on retroflexion. Impression:               1. Unremarkable EGD                           2. 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                           3. No acute or subacute bleeding at this time  reports they are out. Positive stool is yellow.                           4. Poor candidate for colonoscopy. Recommendation:           1. Transfuse to clinically appropriate hemoglobin                           2. Resume previous diet                           3. This patient needs to be on iron sulfate 25 mg                            twice daily INDEFINITELY                           4. If she were to have overt bleeding, high risk                            colonoscopy with the purposes of therapeutics could                            be considered with the patient off oral                            anticoagulation.                           5. Okay to resume anticoagulation                           No further GI work-up at this time plan. We will                            sign off. Procedure Code(s):        --- Professional ---                           [phone],  Esophagogastroduodenoscopy, flexible,                            transoral; diagnostic, including collection of                            specimen(s) by brushing or washing, when performed                            (separate procedure) Diagnosis Code(s):        --- Professional ---                           D50.0, Iron deficiency anemia secondary to blood                            loss (chronic) CPT copyright 2019 [employer]. All rights  reserved. The codes documented in this report are preliminary and upon coder review may  be revised to meet current compliance requirements. Docia Chuck. Henrene Pastor, MD 04/13/2022 12:54:48 PM This report has been signed electronically. Number of Addenda: 0

## 2022-04-13 NOTE — Progress Notes (Signed)
Progress Note   Patient: Tracy Davidson ZOX:[id number] DOB: Jun 22, 1941 DOA: 04/12/2022     1 DOS: the patient was seen and examined on 04/13/2022   Brief hospital course: 81 y.o. 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   Her symptoms started about 2 weeks ago, patient patient lost appetite.  Since then, patient has been only on Ensure 2-3 times a day.  She denied any feeling of nauseous vomiting abdominal pain, she does have occasional diarrhea but she described as mild and loose.  No fever or chills.  Also denied any trouble swallowing, no choking or cough after eating or drinking.  She estimated herself "lost a few pounds in last weeks".  This week, she felt extremely weak and could not even stand up from her chair, she denied any one-sided weakness or numbness.  No fall.  She denied any blood in the stool or dark-colored stool.  Pt was found to have hgb of 6.6 with heme pos stools  Assessment and Plan: Severe hypokalemia -Likely secondary to poor oral intake, with concurrent severe hypomagnesemia. -Potassium and Mg remain low -Give KCl x 73meq with 4g Mg  -Repeat lytes in AM, cont to correct as needed   Severe dehydration -Clinically, patient has symptoms and signs of volume contraction probably from poor oral intake.   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 Recs to continue iron supplementation indefinitely, and OK to resume anticoagulation   Hypercalcemia -Suspect this is more likely related to  dehydration -Ca up to 11.3. will continue IVF. -Repeat lytes in AM, if Ca remains elevated, then consider zometa   Question of dysphagia -Nurse at ED reported that patient appeared to have hard time swallowing pills, given the history of lung cancer and radiation   Leukocytosis -No cough, no diarrhea no dysuria, chest x-ray clear, UA pending.   -WBC stable at 12.8 -Afebrile   Severe protein calorie malnutrition -Has had anorexia, no clear etiology.  Expect GI will perform EGD to rule out any organic reasons.  Speech evaluation underway to rule out any dysphagia. -Trial of Marinol   Right-sided lung CA -Status post radiation therapy, repeat CT scan 2 months ago showed shrinking size of the tumor. Outpatient follow-up with oncology and CT surgery for elective lobectomy.   PAF -Rate controlled -resume eliquis, cleared to resume anticoagulation per GI   HTN -cont lisinopril -Given dehydration, in setting of hyper calcemia, hold HCTZ     Subjective: Seen after EGD. Without complaints  Physical Exam: Vitals:   04/13/22 1300 04/13/22 1310 04/13/22 1347 04/13/22 1641  BP: (!) 150/102 (!) 147/77 (!) 153/94 (!) 147/90  Pulse: (!) 101 (!) 104 (!) 110 (!) 103  Resp: 19 20  19   Temp:   97.7 F (36.5 C) 98.4 F (36.9 C)  TempSrc:   Oral   SpO2: 100% 97% 100% 99%  Weight:      Height:       General exam: Awake, laying in bed, in nad, membranes dry Respiratory system: Normal respiratory effort, no wheezing  Cardiovascular system: regular rate, s1, s2 Gastrointestinal system: Soft, nondistended, positive BS Central nervous system: CN2-12 grossly intact, strength intact Extremities: Perfused, no clubbing Skin: Normal skin turgor, no notable skin lesions seen Psychiatry: Mood normal // no visual hallucinations   Data Reviewed:  Labs reviewed: K 3.4, Ca 11.3, Cr 0.57  Family Communication: Pt in room, family not at bedside  Disposition: Status is: Inpatient Remains inpatient  appropriate because: Severity of illness  Planned Discharge Destination:  Pending PT eval    Author: Marylu Lund, MD 04/13/2022 6:02 PM  For on call review [email or web address].

## 2022-04-13 NOTE — Anesthesia Preprocedure Evaluation (Signed)
Anesthesia Evaluation  Patient identified by MRN, date of birth, ID band Patient awake    Reviewed: Allergy & Precautions, H&P , NPO status , Patient's Chart, lab work & pertinent test results  Airway Mallampati: II   Neck ROM: full    Dental   Pulmonary former smoker,    breath sounds clear to auscultation       Cardiovascular hypertension, + Peripheral Vascular Disease   Rhythm:regular Rate:Normal     Neuro/Psych    GI/Hepatic GERD  ,GI bleeding   Endo/Other    Renal/GU      Musculoskeletal  (+) Arthritis ,   Abdominal   Peds  Hematology  (+) Blood dyscrasia, anemia ,   Anesthesia Other Findings   Reproductive/Obstetrics                             Anesthesia Physical Anesthesia Plan  ASA: 3  Anesthesia Plan: MAC   Post-op Pain Management:    Induction: Intravenous  PONV Risk Score and Plan: 2 and Propofol infusion and Treatment may vary due to age or medical condition  Airway Management Planned: Nasal Cannula  Additional Equipment:   Intra-op Plan:   Post-operative Plan:   Informed Consent: I have reviewed the patients History and Physical, chart, labs and discussed the procedure including the risks, benefits and alternatives for the proposed anesthesia with the patient or authorized representative who has indicated his/her understanding and acceptance.     Dental advisory given  Plan Discussed with: Anesthesiologist, Surgeon and CRNA  Anesthesia Plan Comments:         Anesthesia Quick Evaluation

## 2022-04-13 NOTE — Plan of Care (Signed)
  Problem: Clinical Measurements: Goal: Respiratory complications will improve Outcome: Progressing Goal: Cardiovascular complication will be avoided Outcome: Progressing   Problem: Activity: Goal: Risk for activity intolerance will decrease Outcome: Not Progressing   Problem: Nutrition: Goal: Adequate nutrition will be maintained Outcome: Not Progressing

## 2022-04-13 NOTE — Progress Notes (Signed)
PT Cancellation Note  Patient Details Name: DESTINA MANTEI MRN: [id number] DOB: 11-13-1940   Cancelled Treatment:    Reason Eval/Treat Not Completed: Patient at procedure or test/unavailable Will follow up as schedule allows.   Lou Miner, DPT  Acute Rehabilitation Services  Office: [phone]    Rudean Hitt 04/13/2022, 12:25 PM

## 2022-04-13 NOTE — Hospital Course (Signed)
81 y.o. 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   Her symptoms started about 2 weeks ago, patient patient lost appetite.  Since then, patient has been only on Ensure 2-3 times a day.  She denied any feeling of nauseous vomiting abdominal pain, she does have occasional diarrhea but she described as mild and loose.  No fever or chills.  Also denied any trouble swallowing, no choking or cough after eating or drinking.  She estimated herself "lost a few pounds in last weeks".  This week, she felt extremely weak and could not even stand up from her chair, she denied any one-sided weakness or numbness.  No fall.  She denied any blood in the stool or dark-colored stool.  Pt was found to have hgb of 6.6 with heme pos stools

## 2022-04-13 NOTE — Interval H&P Note (Signed)
History and Physical Interval Note:  04/13/2022 12:20 PM  Tracy Davidson  has presented today for surgery, with the diagnosis of Anemia.  FOBT positive.  Anorexia..  The various methods of treatment have been discussed with the patient and family. After consideration of risks, benefits and other options for treatment, the patient has consented to  Procedure(s): ESOPHAGOGASTRODUODENOSCOPY (EGD) WITH PROPOFOL (N/A) as a surgical intervention.  The patient's history has been reviewed, patient examined, no change in status, stable for surgery.  I have reviewed the patient's chart and labs.  Questions were answered to the patient's satisfaction.     Scarlette Shorts

## 2022-04-13 NOTE — Transfer of Care (Signed)
Immediate Anesthesia Transfer of Care Note  Patient: Tracy Davidson  Procedure(s) Performed: ESOPHAGOGASTRODUODENOSCOPY (EGD) WITH PROPOFOL  Patient Location: Endoscopy Unit  Anesthesia Type:MAC  Level of Consciousness: drowsy and patient cooperative  Airway & Oxygen Therapy: Patient Spontanous Breathing  Post-op Assessment: Report given to RN and Post -op Vital signs reviewed and stable  Post vital signs: Reviewed and stable  Last Vitals:  Vitals Value Taken Time  BP    Temp    Pulse    Resp    SpO2      Last Pain:  Vitals:   04/13/22 1211  TempSrc: Oral  PainSc: 0-No pain         Complications: No notable events documented.

## 2022-04-13 NOTE — Progress Notes (Signed)
ANTICOAGULATION CONSULT NOTE - Initial Consult  Pharmacy Consult for apixaban Indication: atrial fibrillation  Allergies  Allergen Reactions   Vibra-Tab [Doxycycline] Palpitations    Patient Measurements: Height: 5' 3.5" (161.3 cm) Weight: 50.6 kg (111 lb 8.8 oz) IBW/kg (Calculated) : 53.55 Heparin Dosing Weight:   Vital Signs: Temp: 98.4 F (36.9 C) (06/29 1641) Temp Source: Oral (06/29 1347) BP: 147/90 (06/29 1641) Pulse Rate: 103 (06/29 1641)  Labs: Recent Labs    04/12/22 0918 04/12/22 1112 04/12/22 2006 04/12/22 2048 04/13/22 0244 04/13/22 1058  HGB 7.9*  --  7.1*  --  6.6* 8.8*  HCT 26.5*  --  23.6*  --  21.1* 28.3*  PLT 130*  --   --   --  84* 116*  APTT  --  23*  --   --   --   --   LABPROT  --  18.2*  --   --   --   --   INR  --  1.5*  --   --   --   --   CREATININE 0.75  --   --  0.52 0.57  --   CKTOTAL 49  --   --   --   --   --     Estimated Creatinine Clearance: 44.1 mL/min (by C-G formula based on SCr of 0.57 mg/dL).   Medical History: Past Medical History:  Diagnosis Date   ALLERGIC RHINITIS 09/20/2009   Arthritis    LEGS and left arm   Bladder tumor    Cataract    both eyes, right worse thsn left   COLONIC POLYPS, HX OF 06/06/2007   DYSLIPIDEMIA 09/20/2009   Dyspnea    sob with activity   GERD (gastroesophageal reflux disease)    History of radiation therapy    Right lung- 09/01/21-09/16/21- Dr. [name]   HYPERTENSION 06/06/2007   Knee pain    stiff and pain on side   LIVER FUNCTION TESTS, ABNORMAL, HX OF    feb or march 2019   Peripheral vascular disease ([place])    Shingles 11/2019   left arm and shoulder    Medications:  Medications Prior to Admission  Medication Sig Dispense Refill Last Dose   acetaminophen (TYLENOL) 500 MG tablet Take 1,000 mg by mouth 2 (two) times daily as needed for mild pain.   04/11/2022   atorvastatin (LIPITOR) 10 MG tablet TAKE 1 TABLET BY MOUTH EVERY DAY (Patient taking differently: Take 10 mg by  mouth daily.) 90 tablet 3 Past Week   CALCIUM PO Take 1 tablet by mouth daily.   04/11/2022   Cholecalciferol (VITAMIN D-3 PO) Take 1 capsule by mouth daily.   04/11/2022   ELIQUIS 5 MG TABS tablet TAKE 1 TABLET BY MOUTH TWICE A DAY (Patient taking differently: Take 5 mg by mouth 2 (two) times daily.) 60 tablet 2 04/11/2022 at 18:30   KLOR-CON M20 20 MEQ tablet TAKE 1 TABLET BY MOUTH EVERY DAY (Patient taking differently: 20 mEq daily.) 90 tablet 1 04/11/2022   lisinopril-hydrochlorothiazide (ZESTORETIC) 20-25 MG tablet TAKE 1 TABLET BY MOUTH EVERY DAY (Patient taking differently: Take 1 tablet by mouth daily.) 90 tablet 1 04/11/2022   MELATONIN PO Take 1 tablet by mouth daily.   Past Month   omeprazole (PRILOSEC) 20 MG capsule TAKE 1 CAPSULE BY MOUTH EVERY DAY (Patient taking differently: Take 20 mg by mouth daily.) 90 capsule 1 04/11/2022   triamcinolone ointment (KENALOG) 0.1 % Apply 1 application  topically 2 (  two) times daily as needed (itching).   04/12/2022   Scheduled:   apixaban  2.5 mg Oral BID   atorvastatin  10 mg Oral Daily   dronabinol  2.5 mg Oral BID AC   feeding supplement  237 mL Oral BID BM   lisinopril  20 mg Oral Daily   melatonin  5 mg Oral QHS   potassium chloride  60 mEq Oral Once   Infusions:   lactated ringers     magnesium sulfate bolus IVPB      Assessment: Pt presented with weakness due to poor oral intake. She has been on apixaban for her AF. However, she has been taking apixaban 5mg  PO BID  at home but she should be on the reduced dose due to age (>80)  and weight (<60kg). EGD came back neg for bleed today so apixaban will be resumed tonight.   Goal of Therapy:  Monitor platelets by anticoagulation protocol: Yes   Plan:  Resume apixaban at 2.5mg  PO BID Rx will re-edu her due to dose change and monitor peripherally  Onnie Boer, PharmD, BCIDP, AAHIVP, CPP Infectious Disease Pharmacist 04/13/2022 6:25 PM

## 2022-04-13 NOTE — Progress Notes (Signed)
Initial Nutrition Assessment  DOCUMENTATION CODES:   Non-severe (moderate) malnutrition in context of chronic illness  INTERVENTION:   Ensure Enlive po BID, each supplement provides 350 kcal and 20 grams of protein.  MVI with Minerals daily  Continue GI soft diet, liberalize to Regular as able   NUTRITION DIAGNOSIS:   Moderate Malnutrition related to chronic illness as evidenced by moderate muscle depletion, moderate fat depletion.  GOAL:   Patient will meet greater than or equal to 90% of their needs   MONITOR:   PO intake, Labs, Weight trends  REASON FOR ASSESSMENT:   Consult Assessment of nutrition requirement/status  ASSESSMENT:   81 yo female admitted with generalized weakness, severe electrolyte abnormalities with severe dehydration likely related to poor po intake.  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 Appetite has been poor x 1 month  Pt not eating much. Recorded po intake 0%. Appeared that pt had tried to eat some bites of lunch.   Noted marinol started today  Admit wt 50.6 kg; no weight since admission. Pt hast lost weight but unsure how much.   Labs: potssium 3.4 (L), phosphorus 1.8 (L), magnesium 1.5 (L), Creatinine wdl, calcium 11.3 (H) Meds: marinol, potassium phosphate, KCL, mag sulfate, NS at 125 ml/hr   NUTRITION - FOCUSED PHYSICAL EXAM:  Flowsheet Row Most Recent Value  Orbital Region Moderate depletion  Upper Arm Region Mild depletion  Thoracic and Lumbar Region Moderate depletion  Buccal Region Moderate depletion  Temple Region Moderate depletion  Clavicle Bone Region Moderate depletion  Clavicle and Acromion Bone Region Moderate depletion  Scapular Bone Region Moderate depletion  Dorsal Hand Moderate depletion  Patellar Region Severe depletion  Anterior Thigh Region  Severe depletion  Posterior Calf Region Severe depletion  Edema (RD Assessment) None       Diet Order:   Diet Order             DIET SOFT Room service appropriate? Yes; Fluid consistency: Thin  Diet effective now                   EDUCATION NEEDS:   Education needs have been addressed  Skin:  Skin Assessment: Skin Integrity Issues: Skin Integrity Issues:: Other (Comment) Other: skin tear on her rectum  Last BM:  6/29  Height:   Ht Readings from Last 1 Encounters:  04/13/22 5' 3.5" (1.613 m)    Weight:   Wt Readings from Last 1 Encounters:  04/12/22 50.6 kg     BMI:  Body mass index is 19.45 kg/m.  Estimated Nutritional Needs:   Kcal:  1500-1700 kcals  Protein:  70-80 g  Fluid:  >/= 1.5 L   Kerman Passey MS, RDN, LDN, CNSC Registered Dietitian III Clinical Nutrition RD Pager and On-Call Pager Number Located in [place]

## 2022-04-14 ENCOUNTER — Encounter (HOSPITAL_COMMUNITY): Payer: Self-pay | Admitting: Internal Medicine

## 2022-04-14 DIAGNOSIS — R195 Other fecal abnormalities: Secondary | ICD-10-CM | POA: Diagnosis not present

## 2022-04-14 DIAGNOSIS — K922 Gastrointestinal hemorrhage, unspecified: Secondary | ICD-10-CM

## 2022-04-14 DIAGNOSIS — E876 Hypokalemia: Secondary | ICD-10-CM | POA: Diagnosis not present

## 2022-04-14 LAB — COMPREHENSIVE METABOLIC PANEL
ALT: 21 U/L (ref 0–44)
AST: 39 U/L (ref 15–41)
Albumin: 3.1 g/dL — ABNORMAL LOW (ref 3.5–5.0)
Alkaline Phosphatase: 43 U/L (ref 38–126)
Anion gap: 12 (ref 5–15)
BUN: 14 mg/dL (ref 8–23)
CO2: 27 mmol/L (ref 22–32)
Calcium: 11.3 mg/dL — ABNORMAL HIGH (ref 8.9–10.3)
Chloride: 104 mmol/L (ref 98–111)
Creatinine, Ser: 0.63 mg/dL (ref 0.44–1.00)
GFR, Estimated: >60 mL/min (ref >60)
Glucose, Bld: 128 mg/dL — ABNORMAL HIGH (ref 70–99)
Potassium: 3.4 mmol/L — ABNORMAL LOW (ref 3.5–5.1)
Sodium: 143 mmol/L (ref 135–145)
Total Bilirubin: 1.9 mg/dL — ABNORMAL HIGH (ref 0.3–1.2)
Total Protein: 5.5 g/dL — ABNORMAL LOW (ref 6.5–8.1)

## 2022-04-14 LAB — BPAM RBC
Blood Product Expiration Date: 202307062359
ISSUE DATE / TIME: 202306290510
Unit Type and Rh: 6200

## 2022-04-14 LAB — CBC
HCT: 29.5 % — ABNORMAL LOW (ref 36.0–46.0)
Hemoglobin: 9.1 g/dL — ABNORMAL LOW (ref 12.0–15.0)
MCH: 26.8 pg (ref 26.0–34.0)
MCHC: 30.8 g/dL (ref 30.0–36.0)
MCV: 86.8 fL (ref 80.0–100.0)
Platelets: 107 10*3/uL — ABNORMAL LOW (ref 150–400)
RBC: 3.4 MIL/uL — ABNORMAL LOW (ref 3.87–5.11)
RDW: 18.6 % — ABNORMAL HIGH (ref 11.5–15.5)
WBC: 13.9 10*3/uL — ABNORMAL HIGH (ref 4.0–10.5)
nRBC: 0.6 % — ABNORMAL HIGH (ref 0.0–0.2)

## 2022-04-14 LAB — TYPE AND SCREEN
ABO/RH(D): A POS
Antibody Screen: NEGATIVE
Unit division: 0

## 2022-04-14 MED ORDER — SODIUM CHLORIDE 0.9 % IV SOLN
4.0000 mg | Freq: Once | INTRAVENOUS | Status: AC
  Administered 2022-04-14: 4 mg via INTRAVENOUS
  Filled 2022-04-14: qty 5

## 2022-04-14 MED ORDER — PANTOPRAZOLE SODIUM 40 MG PO TBEC
40.0000 mg | DELAYED_RELEASE_TABLET | Freq: Every day | ORAL | Status: DC
  Administered 2022-04-14 – 2022-04-22 (×9): 40 mg via ORAL
  Filled 2022-04-14 (×9): qty 1

## 2022-04-14 MED ORDER — SODIUM CHLORIDE 0.9 % IV SOLN: INTRAVENOUS | Status: DC

## 2022-04-14 MED ORDER — ADULT MULTIVITAMIN W/MINERALS CH
1.0000 | ORAL_TABLET | Freq: Every day | ORAL | Status: DC
  Administered 2022-04-14 – 2022-04-22 (×9): 1 via ORAL
  Filled 2022-04-14 (×9): qty 1

## 2022-04-14 MED ORDER — POTASSIUM PHOSPHATES 15 MMOLE/5ML IV SOLN
30.0000 mmol | Freq: Once | INTRAVENOUS | Status: AC
  Administered 2022-04-14: 30 mmol via INTRAVENOUS
  Filled 2022-04-14: qty 10

## 2022-04-14 NOTE — TOC Initial Note (Signed)
Transition of Care [facility]) - Initial/Assessment Note    Patient Details  Name: Tracy Davidson MRN: [id number] Date of Birth: May 30, 1941  Transition of Care [facility]) CM/SW Contact:    Tracy Davidson, [place] Phone Number: 04/14/2022, 1:52 PM  Clinical Narrative:                  CSW met with pt and pt's friend(former coworker), Tracy Davidson, to discuss SNF rec. Pt lives at home alone in [place] and her family lives in [place]. Tracy Davidson lives locally. Pt is agreeable to SNF recommendation. They are interested in [facility] as it is close to pt's home. CSW explains snf workup and insurance auth process. Fl2 completed and faxed bed requests in hub. Pt is covid vaccinated x4.   Expected Discharge Plan: Skilled Nursing Facility Barriers to Discharge: Continued Medical Work up, Ship broker, SNF Pending bed offer   Patient Goals and CMS Choice Patient states their goals for this hospitalization and ongoing recovery are:: Get into rehab and return home      Expected Discharge Plan and Services Expected Discharge Plan: [place]       Living arrangements for the past 2 months: Single Family Home                                      Prior Living Arrangements/Services Living arrangements for the past 2 months: Single Family Home Lives with:: Self Patient language and need for interpreter reviewed:: Yes        Need for Family Participation in Patient Care: Yes (Comment) Care giver support system in place?: No (comment)   Criminal Activity/Legal Involvement Pertinent to Current Situation/Hospitalization: No - Comment as needed  Activities of Daily Living Home Assistive Devices/Equipment: None ADL Screening (condition at time of admission) Patient's cognitive ability adequate to safely complete daily activities?: No Is the patient deaf or have difficulty hearing?: No Does the patient have difficulty seeing, even when wearing  glasses/contacts?: No Does the patient have difficulty concentrating, remembering, or making decisions?: No Patient able to express need for assistance with ADLs?: Yes Does the patient have difficulty dressing or bathing?: Yes Independently performs ADLs?: No Communication: Independent Dressing (OT): Needs assistance Is this a change from baseline?: Change from baseline, expected to last <3days Grooming: Needs assistance Is this a change from baseline?: Change from baseline, expected to last <3 days Bathing: Needs assistance Is this a change from baseline?: Change from baseline, expected to last <3 days Toileting: Needs assistance Is this a change from baseline?: Pre-admission baseline In/Out Bed: Needs assistance Is this a change from baseline?: Change from baseline, expected to last <3 days Walks in Home: Needs assistance Is this a change from baseline?: Pre-admission baseline Does the patient have difficulty walking or climbing stairs?: Yes Weakness of Legs: Both Weakness of Arms/Hands: Both  Permission Sought/Granted   Permission granted to share information with : Yes, Verbal Permission Granted  Share Information with NAME: Tracy Davidson,Tracy Davidson)   [phone] (Mobile)           Emotional Assessment Appearance:: Appears stated age Attitude/Demeanor/Rapport: Lethargic Affect (typically observed): Accepting Orientation: : Oriented to Self, Oriented to Place, Oriented to  Time, Oriented to Situation Alcohol / Substance Use: Not Applicable Psych Involvement: No (comment)  Admission diagnosis:  Hypokalemia [E87.6] Hypomagnesemia [E83.42] Generalized weakness [R53.1] Gastrointestinal hemorrhage, unspecified gastrointestinal hemorrhage type [K92.2] Patient Active Problem List  Diagnosis Date Noted   Heme positive stool    Symptomatic anemia 04/12/2022   Hypokalemia 04/12/2022   Non-small cell lung cancer, right ([place]) 06/21/2021   Endobronchial cancer, right ([place])     Aortic atherosclerosis ([place]) 06/09/2021   Chronic deep vein thrombosis (DVT) of popliteal vein of left lower extremity ([place]) 06/09/2021   Right middle lobe pulmonary nodule 05/31/2021   Polycythemia 01/30/2020   Sinus tachycardia 01/30/2020   SOB (shortness of breath) on exertion 01/30/2020   Hypercalcemia 01/30/2020   PAD (peripheral artery disease) ([place]) 07/29/2019   Tobacco abuse 03/14/2012   LIVER FUNCTION TESTS, ABNORMAL, HX OF 09/21/2009   Dyslipidemia 09/20/2009   ALLERGIC RHINITIS 09/20/2009   Essential hypertension 06/06/2007   COLONIC POLYPS, HX OF 06/06/2007   PCP:  Billie Ruddy, MD Pharmacy:   [id number] [id number]: [phone]: [phone]    Social Determinants of Health (SDOH) Interventions    Readmission Risk Interventions     No data to display

## 2022-04-14 NOTE — Progress Notes (Signed)
Per patient's friend [name] they did not bring cell phone to the hospital.

## 2022-04-14 NOTE — Progress Notes (Signed)
Progress Note   Patient: Tracy Davidson BLT:[id number] DOB: 08-09-1941 DOA: 04/12/2022     2 DOS: the patient was seen and examined on 04/14/2022   Brief hospital course: 81 y.o. 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   Her symptoms started about 2 weeks ago, patient patient lost appetite.  Since then, patient has been only on Ensure 2-3 times a day.  She denied any feeling of nauseous vomiting abdominal pain, she does have occasional diarrhea but she described as mild and loose.  No fever or chills.  Also denied any trouble swallowing, no choking or cough after eating or drinking.  She estimated herself "lost a few pounds in last weeks".  This week, she felt extremely weak and could not even stand up from her chair, she denied any one-sided weakness or numbness.  No fall.  She denied any blood in the stool or dark-colored stool.  Pt was found to have hgb of 6.6 with heme pos stools  Assessment and Plan: Severe hypokalemia -Likely secondary to poor oral intake, with concurrent severe hypomagnesemia. -Potassium and Mg remain low -Cont to replace as needed  -Recheck bmet in AM   Severe dehydration -Clinically, patient has symptoms and signs of volume contraction probably from poor oral intake.   -Cont IVF hydration -Still appears dehydrated this AM, encourage PO intake   Acute on chronic normocytic anemia secondary to blood loss from known AVM -hgb trended down to 6.6, given 2 units PRBC's with post-transfusion hgb up to 8.8 -GI consulted and pt is now s/p EGD with neg findings. 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 Recs to continue iron supplementation indefinitely, and OK to resume anticoagulation   Hypercalcemia -Suspect this is more likely related to dehydration -Ca remains stable at 11.3 despite  LR -Change IVF to NS -Give trial of zometa x1 -Repeat lytes in AM   Question of dysphagia -Nurse at ED reported that patient appeared to have hard time swallowing pills, given the history of lung cancer and radiation   Leukocytosis -No cough, no diarrhea no dysuria, chest x-ray clear, UA pending.   -WBC stable at 12.8 -Afebrile   Severe protein calorie malnutrition -Has had anorexia, no clear etiology.  Expect GI will perform EGD to rule out any organic reasons.  Speech evaluation underway to rule out any dysphagia. -Given trial of Marinol   Right-sided lung CA -Status post radiation therapy, repeat CT scan 2 months ago showed shrinking size of the tumor. Outpatient follow-up with oncology and CT surgery for elective lobectomy.   PAF -Rate controlled -resume eliquis, cleared to resume anticoagulation per GI   HTN -cont lisinopril -Given dehydration, in setting of hyper calcemia, hold HCTZ     Subjective: Reports feeling better today  Physical Exam: Vitals:   04/13/22 2059 04/14/22 0433 04/14/22 0852 04/14/22 1650  BP: (!) 169/98 (!) 168/98 (!) 147/88 138/88  Pulse: (!) 102 98 89 97  Resp: 19 18 16 16   Temp: 97.7 F (36.5 C) 97.9 F (36.6 C) (!) 97.5 F (36.4 C) 98.1 F (36.7 C)  TempSrc: Oral  Oral   SpO2: 100% 100% 100% 100%  Weight:      Height:       General exam: Conversant, in no acute distress Respiratory system: normal chest rise, clear, no audible wheezing Cardiovascular system: regular rhythm, s1-s2 Gastrointestinal  system: Nondistended, nontender, pos BS Central nervous system: No seizures, no tremors Extremities: No cyanosis, no joint deformities Skin: No rashes, no pallor Psychiatry: Affect normal // no auditory hallucinations   Data Reviewed:  Labs reviewed: K 3.4, Ca 11.3, Cr 0.63  Family Communication: Pt in room, family not at bedside  Disposition: Status is: Inpatient Remains inpatient appropriate because: Severity of illness  Planned  Discharge Destination:  Pending PT eval    Author: Marylu Lund, MD 04/14/2022 5:21 PM  For on call review [email or web address].

## 2022-04-14 NOTE — Care Management Important Message (Signed)
Important Message  Patient Details  Name: Tracy Davidson MRN: [id number] Date of Birth: 09/18/1941   Medicare Important Message Given:  Yes  Patient has a contact precaution order in place will mill IM to the patient home address.    Loyd Marhefka 04/14/2022, 4:38 PM

## 2022-04-14 NOTE — Progress Notes (Signed)
Speech Language Pathology Treatment: Dysphagia  Patient Details Name: Tracy Davidson MRN: [id number] DOB: 10/20/1940 Today's Date: 04/14/2022 Time: 1017-1030 SLP Time Calculation (min) (ACUTE ONLY): 13 min  Assessment / Plan / Recommendation Clinical Impression  RN reported pt coughed x 1 this am with straw sips apple juice. With therapist, in upright position during consecutive sips ,there was only one slight throat clear throughout. Handled solid/soft texture efficiently and timely without residue. Recommend she continue with soft, thin liquids, sit upright, straws allowed with small sips, pills whole in puree. No further ST needed at this time.    HPI HPI: Pt is an 81 y.o. female who presented with worsening of generalized weakness. SLP consulted after ED RN noted pt having a "hard time" swallowing pills. CXR and Ct head negative for acute changes. GI consulted due to anemia; plan for EGD on 6/29 and rx clear liquids for 6/28, full liquid diet ordered by attending. PMH: right-sided lung cancer status post radiation therapy 2022, HTN, PAF on Eliquis, PAD.      SLP Plan  All goals met;Discharge SLP treatment due to (comment)      Recommendations for follow up therapy are one component of a multi-disciplinary discharge planning process, led by the attending physician.  Recommendations may be updated based on patient status, additional functional criteria and insurance authorization.    Recommendations  Diet recommendations: Thin liquid;Other(comment) (soft) Liquids provided via: Straw;Cup Medication Administration: Whole meds with puree Supervision: Staff to assist with self feeding Compensations: Slow rate;Small sips/bites Postural Changes and/or Swallow Maneuvers: Seated upright 90 degrees                Oral Care Recommendations: Oral care BID Follow Up Recommendations: No SLP follow up Assistance recommended at discharge: Intermittent Supervision/Assistance SLP Visit  Diagnosis: Dysphagia, unspecified (R13.10) Plan: All goals met;Discharge SLP treatment due to (comment)           Houston Siren  04/14/2022, 10:36 AM

## 2022-04-14 NOTE — Evaluation (Signed)
Physical Therapy Evaluation Patient Details Name: Tracy Davidson MRN: [id number] DOB: 02-02-1941 Today's Date: 04/14/2022  History of Present Illness  81 y.o. female presented with worsening of generalized weakness after 2 week loss of appetite 6/28. Found to be hypokalemic and hypomagnesemic, Hgb 6.6 PMH: right-sided lung cancer status post radiation therapy 2022, HTN, PAF on Eliquis, PAD,  Clinical Impression  PTA pt living alone in single story home with 2 steps to enter. Pt reports using RW for ambulation, independent in ADLs and iADLs, friend drives her to grocery store and doctor's appointments. Pt is limited in safe mobility by lethargy, decreased strength, balance and endurance in presence of failure to thrive. Pt is currently maxA fot bed mobility and total assist for transfers. PT recommending SNF level rehab at discharge. PT will continue to follow acutely.       Recommendations for follow up therapy are one component of a multi-disciplinary discharge planning process, led by the attending physician.  Recommendations may be updated based on patient status, additional functional criteria and insurance authorization.  Follow Up Recommendations Skilled nursing-short term rehab (<3 hours/day) Can patient physically be transported by private vehicle: No    Assistance Recommended at Discharge Frequent or constant Supervision/Assistance  Patient can return home with the following  A lot of help with walking and/or transfers;A lot of help with bathing/dressing/bathroom;Assistance with cooking/housework;Direct supervision/assist for medications management;Direct supervision/assist for financial management;Assist for transportation;Help with stairs or ramp for entrance    Equipment Recommendations None recommended by PT     Functional Status Assessment Patient has had a recent decline in their functional status and demonstrates the ability to make significant improvements in function in a  reasonable and predictable amount of time.     Precautions / Restrictions Precautions Precautions: Fall Restrictions Weight Bearing Restrictions: No      Mobility  Bed Mobility Overal bed mobility: Needs Assistance Bed Mobility: Supine to Sit     Supine to sit: Max assist     General bed mobility comments: mod A for bringing LE off bed, max A for trunk to upright and pad scoot of hips to EoB    Transfers Overall transfer level: Needs assistance Equipment used: 1 person hand held assist Transfers: Sit to/from Stand, Bed to chair/wheelchair/BSC Sit to Stand: Total assist     Squat pivot transfers: Total assist     General transfer comment: Pt lacks strength to come to upright without total A, unable to maintain once there, requires total A for pivot transfer to chair    Ambulation/Gait               General Gait Details: unable        Balance Overall balance assessment: Needs assistance Sitting-balance support: Feet supported, Feet unsupported, No upper extremity supported, Bilateral upper extremity supported, Single extremity supported Sitting balance-Leahy Scale: Fair     Standing balance support: Bilateral upper extremity supported, During functional activity Standing balance-Leahy Scale: Zero                               Pertinent Vitals/Pain Pain Assessment Pain Assessment: Faces Faces Pain Scale: Hurts little more Pain Location: generalized with movement Pain Descriptors / Indicators: Discomfort Pain Intervention(s): Limited activity within patient's tolerance, Monitored during session, Repositioned    Home Living Family/patient expects to be discharged to:: Private residence Living Arrangements: Alone Available Help at Discharge: Available PRN/intermittently;Friend(s) Type of Home: House Home Access:  Stairs to enter Entrance Stairs-Rails: Psychiatric nurse of Steps: 2   Home Layout: One level Home  Equipment: Conservation officer, nature (2 wheels);Tub bench;Hand held shower head      Prior Function Prior Level of Function : Independent/Modified Independent             Mobility Comments: mobilizes in home with RW, friend takes her to the grocery store ADLs Comments: reports independence in ADLs and iADLs        Extremity/Trunk Assessment   Upper Extremity Assessment Upper Extremity Assessment: Defer to OT evaluation    Lower Extremity Assessment Lower Extremity Assessment: RLE deficits/detail;LLE deficits/detail RLE Deficits / Details: PROM WFL, strength hips 3/5, knee ext 2/5, knee flex 3/5 ankle plantar/dorsi 3/5 RLE Sensation: decreased light touch RLE Coordination: decreased fine motor LLE Deficits / Details: PROM WFL, strength hips 3/5, knee ext 2/5, knee flex 3/5 ankle plantar/dorsi 3/5 LLE Sensation: decreased light touch LLE Coordination: decreased fine motor    Cervical / Trunk Assessment Cervical / Trunk Assessment: Kyphotic  Communication   Communication: No difficulties  Cognition Arousal/Alertness: Lethargic Behavior During Therapy: Flat affect (soft spoken) Overall Cognitive Status: Impaired/Different from baseline Area of Impairment: Problem solving, Safety/judgement                         Safety/Judgement: Decreased awareness of safety   Problem Solving: Requires verbal cues, Requires tactile cues, Slow processing, Decreased initiation General Comments: overall slowed responses with questions and mobilization        General Comments General comments (skin integrity, edema, etc.): VSS on RA        Assessment/Plan    PT Assessment Patient needs continued PT services  PT Problem List Decreased strength;Decreased range of motion;Decreased activity tolerance;Decreased balance;Decreased mobility;Decreased coordination;Decreased cognition;Impaired sensation;Pain       PT Treatment Interventions DME instruction;Gait training;Stair  training;Functional mobility training;Therapeutic activities;Therapeutic exercise;Balance training;Cognitive remediation;Patient/family education    PT Goals (Current goals can be found in the Care Plan section)  Acute Rehab PT Goals Patient Stated Goal: none stated PT Goal Formulation: With patient Time For Goal Achievement: 04/28/22 Potential to Achieve Goals: Fair    Frequency Min 2X/week        AM-PAC PT "6 Clicks" Mobility  Outcome Measure Help needed turning from your back to your side while in a flat bed without using bedrails?: A Lot Help needed moving from lying on your back to sitting on the side of a flat bed without using bedrails?: Total Help needed moving to and from a bed to a chair (including a wheelchair)?: Total Help needed standing up from a chair using your arms (e.g., wheelchair or bedside chair)?: Total Help needed to walk in hospital room?: Total Help needed climbing 3-5 steps with a railing? : Total 6 Click Score: 7    End of Session Equipment Utilized During Treatment: Gait belt Activity Tolerance: Patient limited by lethargy Patient left: in chair;with call bell/phone within reach;with chair alarm set Nurse Communication: Mobility status PT Visit Diagnosis: Unsteadiness on feet (R26.81);Other abnormalities of gait and mobility (R26.89);Muscle weakness (generalized) (M62.81);Difficulty in walking, not elsewhere classified (R26.2);Pain;Adult, failure to thrive (R62.7) Pain - part of body:  (back)    Time: 6440-3474 PT Time Calculation (min) (ACUTE ONLY): 33 min   Charges:   PT Evaluation $PT Eval Moderate Complexity: 1 Mod PT Treatments $Therapeutic Activity: 8-22 mins        Asa Fath B. Migdalia Dk PT, DPT Acute Rehabilitation Services  Please use secure chat or  Call Office [phone]   [place] 04/14/2022, 11:47 AM

## 2022-04-14 NOTE — Plan of Care (Signed)
  Problem: Clinical Measurements: Goal: Respiratory complications will improve Outcome: Progressing Goal: Cardiovascular complication will be avoided Outcome: Progressing   Problem: Elimination: Goal: Will not experience complications related to urinary retention Outcome: Progressing   Problem: Pain Managment: Goal: General experience of comfort will improve Outcome: Progressing   Problem: Activity: Goal: Risk for activity intolerance will decrease Outcome: Not Progressing   Problem: Nutrition: Goal: Adequate nutrition will be maintained Outcome: Not Progressing

## 2022-04-14 NOTE — Care Management Important Message (Signed)
Important Message  Patient Details  Name: Tracy Davidson MRN: [id number] Date of Birth: Mar 29, 1941   Medicare Important Message Given:  Yes     Matilde Markie 04/14/2022, 4:18 PM

## 2022-04-14 NOTE — NC FL2 (Signed)
[place] LEVEL OF CARE SCREENING TOOL     IDENTIFICATION  Patient Name: Tracy Davidson Birthdate: [birth year] Sex: female Admission Date (Current Location): 04/12/2022  [facility] and [place] Number:  Herbalist and Address:  [facility] [place] [place] [place] [address] [place], [place] [address]      Provider Number: [id number]  Attending Physician Name and Address:  Donne Hazel, MD  Relative Name and Phone Number:  [name] Denman [place])   [phone] [facility])    Current Level of Care: Hospital Recommended Level of Care: [place] Prior Approval Number: [id number]  Date Approved/Denied:   PASRR Number:    Discharge Plan: SNF    Current Diagnoses: Patient Active Problem List   Diagnosis Date Noted   Heme positive stool    Symptomatic anemia 04/12/2022   Hypokalemia 04/12/2022   Non-small cell lung cancer, right ([place]) 06/21/2021   Endobronchial cancer, right ([place])    Aortic atherosclerosis ([place]) 06/09/2021   Chronic deep vein thrombosis (DVT) of popliteal vein of left lower extremity ([place]) 06/09/2021   Right middle lobe pulmonary nodule 05/31/2021   Polycythemia 01/30/2020   Sinus tachycardia 01/30/2020   SOB (shortness of breath) on exertion 01/30/2020   Hypercalcemia 01/30/2020   PAD (peripheral artery disease) ([place]) 07/29/2019   Tobacco abuse 03/14/2012   LIVER FUNCTION TESTS, ABNORMAL, HX OF 09/21/2009   Dyslipidemia 09/20/2009   ALLERGIC RHINITIS 09/20/2009   Essential hypertension 06/06/2007   COLONIC POLYPS, HX OF 06/06/2007    Orientation RESPIRATION BLADDER Height & Weight     Self, Time, Situation, Place  Normal External catheter, Incontinent Weight: 111 lb 8.8 oz (50.6 kg) Height:  5' 3.5" (161.3 cm)  BEHAVIORAL SYMPTOMS/MOOD NEUROLOGICAL BOWEL NUTRITION STATUS      Incontinent Diet (see d/c summary)  AMBULATORY STATUS COMMUNICATION OF NEEDS Skin   Extensive Assist Verbally Other (Comment)  (Skin tear, rectum)                       Personal Care Assistance Level of Assistance  Bathing, Feeding, Dressing Bathing Assistance: Limited assistance Feeding assistance: Independent Dressing Assistance: Limited assistance     Functional Limitations Info  Sight, Hearing, Speech Sight Info: Impaired Hearing Info: Adequate Speech Info: Adequate    SPECIAL CARE FACTORS FREQUENCY  PT (By licensed PT), OT (By licensed OT)     PT Frequency: 5x/week OT Frequency: 5x/week            Contractures Contractures Info: Not present    Additional Factors Info  Code Status, Allergies Code Status Info: Full code Allergies Info: Vibra-tab (doxycycline)           Current Medications (04/14/2022):  This is the current hospital active medication list Current Facility-Administered Medications  Medication Dose Route Frequency Provider Last Rate Last Admin   0.9 %  sodium chloride infusion   Intravenous Continuous Donne Hazel, MD 125 mL/hr at 04/14/22 3500 Infusion Verify at 04/14/22 9381   acetaminophen (TYLENOL) tablet 1,000 mg  1,000 mg Oral BID PRN Irene Shipper, MD       apixaban Arne [place]) tablet 2.5 mg  2.5 mg Oral BID Pham, Minh Q, RPH-CPP   2.5 mg at 04/14/22 0855   atorvastatin (LIPITOR) tablet 10 mg  10 mg Oral Daily Irene Shipper, MD   10 mg at 04/14/22 0854   dronabinol (MARINOL) capsule 2.5 mg  2.5 mg Oral BID Dietrich Pates, MD  2.5 mg at 04/14/22 1320   feeding supplement (ENSURE ENLIVE / ENSURE PLUS) liquid 237 mL  237 mL Oral BID BM Donne Hazel, MD   237 mL at 04/14/22 1333   lisinopril (ZESTRIL) tablet 20 mg  20 mg Oral Daily Irene Shipper, MD   20 mg at 04/14/22 0854   melatonin tablet 5 mg  5 mg Oral QHS Irene Shipper, MD   5 mg at 04/13/22 2318   multivitamin with minerals tablet 1 tablet  1 tablet Oral Daily Donne Hazel, MD       pantoprazole (PROTONIX) EC tablet 40 mg  40 mg Oral Daily Donne Hazel, MD   40 mg at 04/14/22 0854   potassium  PHOSPHATE 30 mmol in dextrose 5 % 500 mL infusion  30 mmol Intravenous Once Donne Hazel, MD 85 mL/hr at 04/14/22 0951 30 mmol at 04/14/22 6384     Discharge Medications: Please see discharge summary for a list of discharge medications.  Relevant Imaging Results:  Relevant Lab Results:   Additional Information SSN [place], [place]

## 2022-04-14 NOTE — Anesthesia Postprocedure Evaluation (Signed)
Anesthesia Post Note  Patient: Tracy Davidson  Procedure(s) Performed: ESOPHAGOGASTRODUODENOSCOPY (EGD) WITH PROPOFOL     Patient location during evaluation: Endoscopy Anesthesia Type: MAC Level of consciousness: awake and alert Pain management: pain level controlled Vital Signs Assessment: post-procedure vital signs reviewed and stable Respiratory status: spontaneous breathing, nonlabored ventilation, respiratory function stable and patient connected to nasal cannula oxygen Cardiovascular status: stable and blood pressure returned to baseline Postop Assessment: no apparent nausea or vomiting Anesthetic complications: no   No notable events documented.  Last Vitals:  Vitals:   04/13/22 2059 04/14/22 0433  BP: (!) 169/98 (!) 168/98  Pulse: (!) 102 98  Resp: 19 18  Temp: 36.5 C 36.6 C  SpO2: 100% 100%    Last Pain:  Vitals:   04/13/22 2059  TempSrc: Oral  PainSc:                  Max S

## 2022-04-14 NOTE — Evaluation (Signed)
Occupational Therapy Evaluation Patient Details Name: Tracy Davidson MRN: [id number] DOB: July 22, 1941 Today's Date: 04/14/2022   History of Present Illness 81 y.o. female presented with worsening of generalized weakness after 2 week loss of appetite 6/28. Found to be hypokalemic and hypomagnesemic, Hgb 6.6 PMH: right-sided lung cancer status post radiation therapy 2022, HTN, PAF on Eliquis, PAD,   Clinical Impression   Pt currently total assist +2 for simulated LB selfcare and transfers stand pivot from bedside recliner to bed.  Max assist for toilet hygiene in bed rolling side to side as well as total +2 if standing.  Feel she will benefit from acute care OT to progress ADL independence however, since she lives alone, recommend SNF for follow-up rehab.        Recommendations for follow up therapy are one component of a multi-disciplinary discharge planning process, led by the attending physician.  Recommendations may be updated based on patient status, additional functional criteria and insurance authorization.   Follow Up Recommendations  Skilled nursing-short term rehab (<3 hours/day)    Assistance Recommended at Discharge Frequent or constant Supervision/Assistance  Patient can return home with the following Two people to help with walking and/or transfers;Two people to help with bathing/dressing/bathroom;Direct supervision/assist for medications management;Assist for transportation;Assistance with cooking/housework;Direct supervision/assist for financial management    Functional Status Assessment  Patient has had a recent decline in their functional status and demonstrates the ability to make significant improvements in function in a reasonable and predictable amount of time.  Equipment Recommendations  None recommended by OT;Other (comment) (defer to SNF)       Precautions / Restrictions Precautions Precautions: Fall Restrictions Weight Bearing Restrictions: No      Mobility  Bed Mobility Overal bed mobility: Needs Assistance Bed Mobility: Sit to Supine       Sit to supine: Mod assist   General bed mobility comments: Pt needed min assist for lifting her LEs into the bed.    Transfers Overall transfer level: Needs assistance Equipment used: None Transfers: Sit to/from Stand, Bed to chair/wheelchair/BSC Sit to Stand: Total assist, +2 physical assistance   Squat pivot transfers: Total assist, +2 physical assistance              Balance Overall balance assessment: Needs assistance Sitting-balance support: Feet supported Sitting balance-Leahy Scale: Fair Sitting balance - Comments: Pt able to maintain standing balance with close supervision.   Standing balance support: Bilateral upper extremity supported, During functional activity Standing balance-Leahy Scale: Zero Standing balance comment: Pt unable to complete standing without total assist from therapist, and then knees still remained flexed without any push into the floor.                           ADL either performed or assessed with clinical judgement   ADL Overall ADL's : Needs assistance/impaired Eating/Feeding: Set up;Sitting   Grooming: Wash/dry face;Wash/dry hands;Set up;Sitting Grooming Details (indicate cue type and reason): simulated Upper Body Bathing: Set up;Sitting   Lower Body Bathing: Total assistance;+2 for physical assistance;Sit to/from stand   Upper Body Dressing : Minimal assistance;Sitting   Lower Body Dressing: Total assistance;+2 for physical assistance;Sit to/from stand   Toilet Transfer: Total assistance;+2 for physical assistance;Stand-pivot Toilet Transfer Details (indicate cue type and reason): simulated Toileting- Clothing Manipulation and Hygiene: Total assistance;Sit to/from stand;+2 for physical assistance         General ADL Comments: Pt with decreased knee and hip extension when standing requiring  total +2 for sit to stand and stand  pivot transfer from recliner to bed.     Vision Baseline Vision/History: 1 Wears glasses Ability to See in Adequate Light: 0 Adequate Patient Visual Report: No change from baseline Vision Assessment?: No apparent visual deficits     Perception Perception Perception: Within Functional Limits   Praxis Praxis Praxis: Intact    Pertinent Vitals/Pain Pain Assessment Pain Assessment: Faces Faces Pain Scale: Hurts little more Pain Location: buttocks with toilet hygiene Pain Descriptors / Indicators: Grimacing Pain Intervention(s): Limited activity within patient's tolerance, Repositioned     Hand Dominance Right   Extremity/Trunk Assessment Upper Extremity Assessment Upper Extremity Assessment: Generalized weakness (strength 3+/5 throughout)   Lower Extremity Assessment Lower Extremity Assessment: Defer to PT evaluation RLE Deficits / Details: PROM WFL, strength hips 3/5, knee ext 2/5, knee flex 3/5 ankle plantar/dorsi 3/5 RLE Sensation: decreased light touch RLE Coordination: decreased fine motor LLE Deficits / Details: PROM WFL, strength hips 3/5, knee ext 2/5, knee flex 3/5 ankle plantar/dorsi 3/5 LLE Sensation: decreased light touch LLE Coordination: decreased fine motor   Cervical / Trunk Assessment Cervical / Trunk Assessment: Kyphotic   Communication Communication Communication: No difficulties   Cognition Arousal/Alertness: Awake/alert Behavior During Therapy: Flat affect Overall Cognitive Status: Impaired/Different from baseline Area of Impairment: Awareness, Problem solving                             Problem Solving: Requires verbal cues       General Comments  VSS on RA            Home Living Family/patient expects to be discharged to:: Private residence Living Arrangements: Alone Available Help at Discharge: Available PRN/intermittently;Friend(s) Type of Home: House Home Access: Stairs to enter Technical brewer of Steps:  2 Entrance Stairs-Rails: Right;Left Home Layout: One level     Bathroom Shower/Tub: Teacher, early years/pre: Standard Bathroom Accessibility: No   Home Equipment: Conservation officer, nature (2 wheels);Tub bench;Hand held shower head          Prior Functioning/Environment Prior Level of Function : Independent/Modified Independent             Mobility Comments: mobilizes in home with RW, friend takes her to the grocery store ADLs Comments: reports independence in ADLs and iADLs        OT Problem List: Decreased strength;Decreased activity tolerance;Impaired balance (sitting and/or standing);Pain;Decreased knowledge of use of DME or AE      OT Treatment/Interventions: Self-care/ADL training;Patient/family education;Balance training;Therapeutic activities;DME and/or AE instruction;Therapeutic exercise;Energy conservation    OT Goals(Current goals can be found in the care plan section) Acute Rehab OT Goals Patient Stated Goal: Patient wants to get stronger OT Goal Formulation: With patient Time For Goal Achievement: 04/28/22 Potential to Achieve Goals: Good  OT Frequency: Min 2X/week       AM-PAC OT "6 Clicks" Daily Activity     Outcome Measure Help from another person eating meals?: None Help from another person taking care of personal grooming?: A Little Help from another person toileting, which includes using toliet, bedpan, or urinal?: Total Help from another person bathing (including washing, rinsing, drying)?: Total Help from another person to put on and taking off regular upper body clothing?: A Little Help from another person to put on and taking off regular lower body clothing?: Total 6 Click Score: 13   End of Session Nurse Communication: Mobility status;Need for lift equipment  Activity  Tolerance: Patient tolerated treatment well Patient left: in bed;with call bell/phone within reach;with nursing/sitter in room  OT Visit Diagnosis: Unsteadiness on feet  (R26.81);Muscle weakness (generalized) (M62.81);Pain                Time: 3013-1438 OT Time Calculation (min): 27 min Charges:  OT General Charges $OT Visit: 1 Visit OT Evaluation $OT Eval Moderate Complexity: 1 Mod OT Treatments $Self Care/Home Management : 8-22 mins Xoie Kreuser OTR/L 04/14/2022, 1:43 PM

## 2022-04-15 DIAGNOSIS — K264 Chronic or unspecified duodenal ulcer with hemorrhage: Secondary | ICD-10-CM | POA: Diagnosis not present

## 2022-04-15 DIAGNOSIS — E440 Moderate protein-calorie malnutrition: Secondary | ICD-10-CM | POA: Insufficient documentation

## 2022-04-15 DIAGNOSIS — R195 Other fecal abnormalities: Secondary | ICD-10-CM | POA: Diagnosis not present

## 2022-04-15 DIAGNOSIS — E876 Hypokalemia: Secondary | ICD-10-CM | POA: Diagnosis not present

## 2022-04-15 LAB — COMPREHENSIVE METABOLIC PANEL
ALT: 19 U/L (ref 0–44)
AST: 33 U/L (ref 15–41)
Albumin: 2.6 g/dL — ABNORMAL LOW (ref 3.5–5.0)
Alkaline Phosphatase: 36 U/L — ABNORMAL LOW (ref 38–126)
Anion gap: 5 (ref 5–15)
BUN: 12 mg/dL (ref 8–23)
CO2: 27 mmol/L (ref 22–32)
Calcium: 9.1 mg/dL (ref 8.9–10.3)
Chloride: 108 mmol/L (ref 98–111)
Creatinine, Ser: 0.66 mg/dL (ref 0.44–1.00)
GFR, Estimated: >60 mL/min (ref >60)
Glucose, Bld: 115 mg/dL — ABNORMAL HIGH (ref 70–99)
Potassium: 3 mmol/L — ABNORMAL LOW (ref 3.5–5.1)
Sodium: 140 mmol/L (ref 135–145)
Total Bilirubin: 1.1 mg/dL (ref 0.3–1.2)
Total Protein: 4.6 g/dL — ABNORMAL LOW (ref 6.5–8.1)

## 2022-04-15 LAB — CBC
HCT: 23.8 % — ABNORMAL LOW (ref 36.0–46.0)
Hemoglobin: 7.5 g/dL — ABNORMAL LOW (ref 12.0–15.0)
MCH: 26.6 pg (ref 26.0–34.0)
MCHC: 31.5 g/dL (ref 30.0–36.0)
MCV: 84.4 fL (ref 80.0–100.0)
Platelets: 81 10*3/uL — ABNORMAL LOW (ref 150–400)
RBC: 2.82 MIL/uL — ABNORMAL LOW (ref 3.87–5.11)
RDW: 17.6 % — ABNORMAL HIGH (ref 11.5–15.5)
WBC: 11 10*3/uL — ABNORMAL HIGH (ref 4.0–10.5)
nRBC: 0.4 % — ABNORMAL HIGH (ref 0.0–0.2)

## 2022-04-15 LAB — URINE CULTURE: Culture: 60000 — AB

## 2022-04-15 LAB — MAGNESIUM: Magnesium: 1.3 mg/dL — ABNORMAL LOW (ref 1.7–2.4)

## 2022-04-15 MED ORDER — POLYETHYLENE GLYCOL 3350 17 G PO PACK
17.0000 g | PACK | Freq: Every day | ORAL | Status: DC
  Administered 2022-04-15 – 2022-04-22 (×4): 17 g via ORAL
  Filled 2022-04-15 (×8): qty 1

## 2022-04-15 MED ORDER — POTASSIUM CHLORIDE CRYS ER 20 MEQ PO TBCR
60.0000 meq | EXTENDED_RELEASE_TABLET | Freq: Once | ORAL | Status: AC
Start: 2022-04-15 — End: 2022-04-15
  Administered 2022-04-15: 60 meq via ORAL
  Filled 2022-04-15: qty 3

## 2022-04-15 MED ORDER — POTASSIUM CHLORIDE CRYS ER 20 MEQ PO TBCR
40.0000 meq | EXTENDED_RELEASE_TABLET | ORAL | Status: AC
  Administered 2022-04-15 (×2): 40 meq via ORAL
  Filled 2022-04-15 (×2): qty 2

## 2022-04-15 MED ORDER — MAGNESIUM SULFATE 4 GM/100ML IV SOLN
4.0000 g | Freq: Once | INTRAVENOUS | Status: AC
  Administered 2022-04-15: 4 g via INTRAVENOUS
  Filled 2022-04-15: qty 100

## 2022-04-15 NOTE — Progress Notes (Signed)
Progress Note   Patient: Tracy Davidson ZWC:[id number] DOB: Jul 18, 1941 DOA: 04/12/2022     3 DOS: the patient was seen and examined on 04/15/2022   Brief hospital course: 81 y.o. 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   Her symptoms started about 2 weeks ago, patient patient lost appetite.  Since then, patient has been only on Ensure 2-3 times a day.  She denied any feeling of nauseous vomiting abdominal pain, she does have occasional diarrhea but she described as mild and loose.  No fever or chills.  Also denied any trouble swallowing, no choking or cough after eating or drinking.  She estimated herself "lost a few pounds in last weeks".  This week, she felt extremely weak and could not even stand up from her chair, she denied any one-sided weakness or numbness.  No fall.  She denied any blood in the stool or dark-colored stool.  Pt was found to have hgb of 6.6 with heme pos stools  Assessment and Plan: Severe hypokalemia and hypomagnesemia -Likely secondary to poor oral intake, with concurrent severe hypomagnesemia. -Potassium and Mg remain low -Will replace -Recheck bmet in AM   Severe dehydration -Clinically, patient has symptoms and signs of volume contraction probably from poor oral intake.   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 Recs to continue iron supplementation indefinitely, and OK to resume anticoagulation -Tolerating anticoagulation at this time   Hypercalcemia -Suspect this is more likely related to dehydration -Given NS IVF hydration -Ca now normalized  after zometa x 1 -repeat bmet in AM   Question of dysphagia -Nurse at ED reported that patient appeared to have hard time swallowing pills, given the history of lung cancer and radiation   Leukocytosis -No cough, no diarrhea no dysuria, chest x-ray clear, UA pending.   -WBC stable at 12.8 -Afebrile   Severe protein calorie malnutrition -Has had anorexia, no clear etiology.  Expect GI will perform EGD to rule out any organic reasons.  Speech evaluation underway to rule out any dysphagia. -Given trial of Marinol   Right-sided lung CA -Status post radiation therapy, repeat CT scan 2 months ago showed shrinking size of the tumor. Outpatient follow-up with oncology and CT surgery for elective lobectomy.   PAF -Rate controlled -resume eliquis, cleared to resume anticoagulation per GI   HTN -cont lisinopril -Given dehydration, in setting of hyper calcemia, hold HCTZ  Constipation -Pt reports hx of BM 1-2 times per week PTA -Now agreeable to miralax, have ordered     Subjective: Not very talkative, however states feeling somewhat better  Physical Exam: Vitals:   04/14/22 2122 04/15/22 0513 04/15/22 0533 04/15/22 0827  BP: (!) 158/79 134/77  (!) 150/83  Pulse: (!) 102 (!) 103  100  Resp: 20 18  14   Temp: 98.2 F (36.8 C) 97.8 F (36.6 C)  97.8 F (36.6 C)  TempSrc: Oral Oral  Oral  SpO2: 99% 98%  100%  Weight:   54.4 kg   Height:       General exam: Awake, laying in bed, in nad Respiratory system: Normal respiratory  effort, no wheezing Cardiovascular system: regular rate, s1, s2 Gastrointestinal system: Soft, nondistended, positive BS Central nervous system: CN2-12 grossly intact, strength intact Extremities: Perfused, no clubbing Skin: Normal skin turgor, no notable skin lesions seen Psychiatry: Mood normal // no visual hallucinations   Data Reviewed:  Labs reviewed: K 3.0, Ca 9.1, Cr 0.66  Family Communication: Pt in room, family not at  bedside  Disposition: Status is: Inpatient Remains inpatient appropriate because: Severity of illness  Planned Discharge Destination:  Pending PT eval    Author: Marylu Lund, MD 04/15/2022 2:03 PM  For on call review [email or web address].

## 2022-04-16 DIAGNOSIS — E876 Hypokalemia: Secondary | ICD-10-CM | POA: Diagnosis not present

## 2022-04-16 DIAGNOSIS — R627 Adult failure to thrive: Secondary | ICD-10-CM | POA: Diagnosis not present

## 2022-04-16 DIAGNOSIS — Z7189 Other specified counseling: Secondary | ICD-10-CM

## 2022-04-16 DIAGNOSIS — R531 Weakness: Secondary | ICD-10-CM | POA: Diagnosis not present

## 2022-04-16 DIAGNOSIS — Z515 Encounter for palliative care: Secondary | ICD-10-CM | POA: Diagnosis not present

## 2022-04-16 DIAGNOSIS — K922 Gastrointestinal hemorrhage, unspecified: Secondary | ICD-10-CM | POA: Diagnosis not present

## 2022-04-16 LAB — BASIC METABOLIC PANEL
Anion gap: 10 (ref 5–15)
BUN: 12 mg/dL (ref 8–23)
CO2: 25 mmol/L (ref 22–32)
Calcium: 9 mg/dL (ref 8.9–10.3)
Chloride: 110 mmol/L (ref 98–111)
Creatinine, Ser: 0.6 mg/dL (ref 0.44–1.00)
GFR, Estimated: >60 mL/min (ref >60)
Glucose, Bld: 104 mg/dL — ABNORMAL HIGH (ref 70–99)
Potassium: 2.7 mmol/L — CL (ref 3.5–5.1)
Sodium: 145 mmol/L (ref 135–145)

## 2022-04-16 LAB — MAGNESIUM: Magnesium: 2.3 mg/dL (ref 1.7–2.4)

## 2022-04-16 LAB — PHOSPHORUS: Phosphorus: 1.3 mg/dL — ABNORMAL LOW (ref 2.5–4.6)

## 2022-04-16 MED ORDER — K PHOS MONO-SOD PHOS DI & MONO 155-852-130 MG PO TABS: 500.0000 mg | ORAL_TABLET | Freq: Two times a day (BID) | ORAL | Status: DC

## 2022-04-16 MED ORDER — POTASSIUM CHLORIDE 20 MEQ PO PACK
40.0000 meq | PACK | Freq: Once | ORAL | Status: AC
Start: 2022-04-16 — End: 2022-04-16
  Administered 2022-04-16: 40 meq via ORAL
  Filled 2022-04-16: qty 2

## 2022-04-16 MED ORDER — ORAL CARE MOUTH RINSE
15.0000 mL | OROMUCOSAL | Status: DC | PRN
Start: 2022-04-16 — End: 2022-04-22

## 2022-04-16 MED ORDER — BISACODYL 10 MG RE SUPP
10.0000 mg | Freq: Every day | RECTAL | Status: DC | PRN
Start: 2022-04-16 — End: 2022-04-22

## 2022-04-16 MED ORDER — K PHOS MONO-SOD PHOS DI & MONO 155-852-130 MG PO TABS
500.0000 mg | ORAL_TABLET | Freq: Two times a day (BID) | ORAL | Status: DC
  Filled 2022-04-16: qty 2

## 2022-04-16 MED ORDER — POTASSIUM PHOSPHATES 15 MMOLE/5ML IV SOLN
45.0000 mmol | Freq: Once | INTRAVENOUS | Status: AC
  Administered 2022-04-16: 45 mmol via INTRAVENOUS
  Filled 2022-04-16: qty 15

## 2022-04-16 MED ORDER — POTASSIUM CHLORIDE 10 MEQ/100ML IV SOLN
10.0000 meq | INTRAVENOUS | Status: AC
  Administered 2022-04-16 (×6): 10 meq via INTRAVENOUS
  Filled 2022-04-16 (×6): qty 100

## 2022-04-16 NOTE — Consult Note (Signed)
Consultation Note Date: 04/16/2022   Patient Name: Tracy Davidson  DOB: Sep 12, 1941  MRN: [id number]  Age / Sex: 81 y.o., female  PCP: Billie Ruddy, MD Referring Physician: Donne Hazel, MD  Reason for Consultation: Establishing goals of care  HPI/Patient Profile: 81 y.o. female  with past medical history of right-sided lung cancer s/p radiation 2022, HTN, PAF on Eliquis, PAD, HLD, GERD admitted on 04/12/2022 with weakness and poor appetite only taking Ensure. Treating for severe dehydration with hypokalemia and anemia. 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 EGD negative.   Clinical Assessment and Goals of Care: I have reviewed records from hospitalization including diagnostics and consultations. I spoke with Dr. Wyline Copas as well.   I met with Ms. Klepper along with her friend, [name], at her bedside. Ms. Booher gave me permission to speak with [name] present. Ms. Bolte is pleasant and answers questions but does not contribute much information to the conversation unless asked. She appears fatigued. Ms. Lodico shares that she felt fine up until 2 weeks ago when she began to have some pains in lower back and this was followed by a sudden decline with complete loss of appetite. She denies nausea but just unable to eat.   We further discussed concern with her decline with not eating/drinking and her weakness. We discussed the difficult reality of her situation. I expressed concern that if there is nothing significant for [place] to treat and reverse I worry about how she will be able to continue to survive like this. She understands. I discussed with her that as unpleasant as it may be it is time to think about her wishes and what she would want if her health were to worsen. She talks of having some plans for her burial. I spoke with her about code status and feeding  tubes. Ms. Stitt has not given a lot of thought to decisions like these. She initially shares that she wants full code and when I discuss further she recognizes that she needs to think more about if this is what she would really want. She does name her friend [name] as the person she would trust to make decisions for her (she also mentions her pastor). I will consult spiritual care for additional support and prayer. I left her to contemplate our discussion as this is a lot of heavy information that we covered today.   All questions/concerns addressed. Emotional support provided. I will follow up for additional support tomorrow.   Primary Decision Maker PATIENT    SUMMARY OF RECOMMENDATIONS   - Ongoing goals of care conversation  Code Status/Advance Care Planning: Full code   Symptom Management:  Per attending.   Palliative Prophylaxis:  Bowel Regimen, Delirium Protocol, Frequent Pain Assessment, Oral Care, and Turn Reposition  Psycho-social/Spiritual:  Desire for further Chaplaincy support:yes  Prognosis:  Overall prognosis poor with failure to thrive.   Discharge Planning: To Be Determined      Primary Diagnoses: Present on  Admission: **None**   I have reviewed the medical record, interviewed the patient and family, and examined the patient. The following aspects are pertinent.  Past Medical History:  Diagnosis Date   ALLERGIC RHINITIS 09/20/2009   Arthritis    LEGS and left arm   Bladder tumor    Cataract    both eyes, right worse thsn left   COLONIC POLYPS, HX OF 06/06/2007   DYSLIPIDEMIA 09/20/2009   Dyspnea    sob with activity   GERD (gastroesophageal reflux disease)    History of radiation therapy    Right lung- 09/01/21-09/16/21- Dr. [name]   HYPERTENSION 06/06/2007   Knee pain    stiff and pain on side   LIVER FUNCTION TESTS, ABNORMAL, HX OF    feb or march 2019   Peripheral vascular disease ([place])    Shingles 11/2019   left arm and  shoulder   Social History   Socioeconomic History   Marital status: Single    Spouse name: Not on file   Number of children: 0   Years of education: 6 years college   Highest education level: Master's degree (e.g., MA, MS, MEng, MEd, MSW, MBA)  Occupational History   Occupation: retired  Tobacco Use   Smoking status: Former    Packs/day: 1.00    Years: 50.00    Total pack years: 50.00    Types: Cigarettes    Start date: 1966   Smokeless tobacco: Never   Tobacco comments:    Stopped smoking 07/10/2021  Vaping Use   Vaping Use: Never used  Substance and Sexual Activity   Alcohol use: Not Currently    Alcohol/week: 2.0 standard drinks of alcohol    Types: 2 Shots of liquor per week    Comment: daily alcohol   Drug use: No   Sexual activity: Not on file  Other Topics Concern   Not on file  Social History Narrative   Not on file   Social Determinants of Health   Financial Resource Strain: Low Risk  (10/27/2021)   Overall Financial Resource Strain (CARDIA)    Difficulty of Paying Living Expenses: Not hard at all  Food Insecurity: No Food Insecurity (10/27/2021)   Hunger Vital Sign    Worried About Running Out of Food in the Last Year: Never true    Ran Out of Food in the Last Year: Never true  Transportation Needs: No Transportation Needs (10/27/2021)   PRAPARE - Hydrologist (Medical): No    Lack of Transportation (Non-Medical): No  Physical Activity: Inactive (10/27/2021)   Exercise Vital Sign    Days of Exercise per Week: 0 days    Minutes of Exercise per Session: 0 min  Stress: No Stress Concern Present (10/27/2021)   [place]    Feeling of Stress : Not at all  Social Connections: Moderately Integrated (10/27/2021)   Social Connection and Isolation Panel [NHANES]    Frequency of Communication with Friends and Family: More than three times a week    Frequency of Social  Gatherings with Friends and Family: More than three times a week    Attends Religious Services: More than 4 times per year    Active Member of [employer] or Organizations: Yes    Attends Music therapist: More than 4 times per year    Marital Status: Divorced   Family History  Problem Relation Age of Onset   Colon cancer  Neg Hx    Colon polyps Neg Hx    Esophageal cancer Neg Hx    Rectal cancer Neg Hx    Stomach cancer Neg Hx    Pancreatic cancer Neg Hx    Breast cancer Neg Hx    Scheduled Meds:  apixaban  2.5 mg Oral BID   atorvastatin  10 mg Oral Daily   dronabinol  2.5 mg Oral BID AC   feeding supplement  237 mL Oral BID BM   lisinopril  20 mg Oral Daily   melatonin  5 mg Oral QHS   multivitamin with minerals  1 tablet Oral Daily   pantoprazole  40 mg Oral Daily   polyethylene glycol  17 g Oral Daily   Continuous Infusions:  sodium chloride 100 mL/hr at 04/16/22 0620   potassium chloride 10 mEq (04/16/22 0812)   potassium PHOSPHATE IVPB (in mmol)     PRN Meds:.acetaminophen, mouth rinse Allergies  Allergen Reactions   Vibra-Tab [Doxycycline] Palpitations   Review of Systems  Constitutional:  Positive for activity change, appetite change, fatigue and unexpected weight change.  Musculoskeletal:  Positive for back pain.  Neurological:  Positive for weakness.    Physical Exam Vitals and nursing note reviewed.  Constitutional:      General: She is not in acute distress.    Appearance: She is cachectic. She is ill-appearing.  Cardiovascular:     Rate and Rhythm: Tachycardia present.  Pulmonary:     Effort: No tachypnea, accessory muscle usage or respiratory distress.  Abdominal:     Palpations: Abdomen is soft.  Neurological:     Mental Status: She is alert and oriented to person, place, and time.     Vital Signs: BP 136/79 (BP Location: Left Arm)   Pulse (!) 105   Temp 98.2 F (36.8 C)   Resp 16   Ht 5' 3.5" (1.613 m)   Wt 54.4 kg   SpO2 99%    BMI 20.91 kg/m  Pain Scale: 0-10   Pain Score: 0-No pain   SpO2: SpO2: 99 % O2 Device:SpO2: 99 % O2 Flow Rate: .O2 Flow Rate (L/min): 3 L/min  IO: Intake/output summary:  Intake/Output Summary (Last 24 hours) at 04/16/2022 0912 Last data filed at 04/16/2022 0600 Gross per 24 hour  Intake 1654.57 ml  Output 1200 ml  Net 454.57 ml    LBM: Last BM Date : 04/13/22 Baseline Weight: Weight: 50.6 kg Most recent weight: Weight: 54.4 kg     Palliative Assessment/Data:     Time In: 1540  Time Total: 75 min  Greater than 50%  of this time was spent counseling and coordinating care related to the above assessment and plan.  Signed by: Vinie Sill, NP Palliative Medicine Team Pager # [phone] (M-F 8a-5p) Team Phone # [phone] (Nights/Weekends)

## 2022-04-16 NOTE — Progress Notes (Signed)
Progress Note   Patient: Tracy Davidson HAL:[id number] DOB: 07-Aug-1941 DOA: 04/12/2022     4 DOS: the patient was seen and examined on 04/16/2022   Brief hospital course: 81 y.o. 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   Her symptoms started about 2 weeks ago, patient patient lost appetite.  Since then, patient has been only on Ensure 2-3 times a day.  She denied any feeling of nauseous vomiting abdominal pain, she does have occasional diarrhea but she described as mild and loose.  No fever or chills.  Also denied any trouble swallowing, no choking or cough after eating or drinking.  She estimated herself "lost a few pounds in last weeks".  This week, she felt extremely weak and could not even stand up from her chair, she denied any one-sided weakness or numbness.  No fall.  She denied any blood in the stool or dark-colored stool.  Pt was found to have hgb of 6.6 with heme pos stools  Assessment and Plan: Severe hypokalemia and hypomagnesemia -Likely secondary to poor oral intake, with concurrent severe hypomagnesemia. -Potassium remains low -Will replace -Recheck bmet in AM  Hypophosphatemia -will replace   Severe dehydration -Clinically, patient has symptoms and signs of volume contraction probably from poor oral intake.   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 Recs to continue iron supplementation indefinitely, and OK to resume anticoagulation -Tolerating anticoagulation at this time   Hypercalcemia -Suspect this is more likely related to dehydration -Given NS IVF  hydration -Ca now normalized after zometa x 1 -continue to follow BMET trends   Question of dysphagia -Nurse at ED reported that patient appeared to have hard time swallowing pills, given the history of lung cancer and radiation   Leukocytosis -No cough, no diarrhea no dysuria, chest x-ray clear, UA clear -WBC trending down -Remains afebrile   Severe protein calorie malnutrition -Has had anorexia, no clear etiology.  Expect GI will perform EGD to rule out any organic reasons.  Speech evaluation underway to rule out any dysphagia. -Given trial of Marinol   Right-sided lung CA -Status post radiation therapy, repeat CT scan 2 months ago showed shrinking size of the tumor. Outpatient follow-up with oncology and CT surgery for elective lobectomy.   PAF -Rate controlled -resume eliquis, cleared to resume anticoagulation per GI   HTN -cont lisinopril -Given dehydration, in setting of hyper calcemia, hold HCTZ  Constipation -Pt reports hx of BM 1-2 times per week PTA -Now agreeable to miralax, continue daily miralax -Recent pelvic xray with large stool burden noted -will add PRN dulcolax  Citrobacter braakii bacturia -Pt does have lower abd discomfort, however does have constipation per above -UA is unremarkable -WBC trending down and pt without fevers -Will f/u on blood cx -hold off on abx at this time     Subjective: Reports feeling uncomfortable laying in bed  Physical Exam: Vitals:   04/15/22 1653 04/15/22 2034 04/16/22 0501 04/16/22 0949  BP: 133/71 130/71 136/79 135/77  Pulse: 99 99 (!) 105 100  Resp: 19 17 16 16   Temp: 98.8 F (37.1 C) 99.2  F (37.3 C) 98.2 F (36.8 C) 98.3 F (36.8 C)  TempSrc:  Oral    SpO2: 97% 99% 99% 99%  Weight:      Height:       General exam: Conversant, in no acute distress Respiratory system: normal chest rise, clear, no audible wheezing Cardiovascular system: regular rhythm, s1-s2 Gastrointestinal system: Nondistended,  nontender, pos BS Central nervous system: No seizures, no tremors Extremities: No cyanosis, no joint deformities Skin: No rashes, no pallor Psychiatry: Affect normal // no auditory hallucinations   Data Reviewed:  Labs reviewed: K 2.7, Ca 9.0, Cr 0.60  Family Communication: Pt in room, family not at bedside  Disposition: Status is: Inpatient Remains inpatient appropriate because: Severity of illness  Planned Discharge Destination:  Pending PT eval    Author: Marylu Lund, MD 04/16/2022 5:39 PM  For on call review [email or web address].

## 2022-04-17 DIAGNOSIS — R627 Adult failure to thrive: Secondary | ICD-10-CM | POA: Diagnosis not present

## 2022-04-17 DIAGNOSIS — Z7189 Other specified counseling: Secondary | ICD-10-CM | POA: Diagnosis not present

## 2022-04-17 DIAGNOSIS — E876 Hypokalemia: Secondary | ICD-10-CM | POA: Diagnosis not present

## 2022-04-17 DIAGNOSIS — R195 Other fecal abnormalities: Secondary | ICD-10-CM | POA: Diagnosis not present

## 2022-04-17 DIAGNOSIS — K922 Gastrointestinal hemorrhage, unspecified: Secondary | ICD-10-CM | POA: Diagnosis not present

## 2022-04-17 DIAGNOSIS — Z515 Encounter for palliative care: Secondary | ICD-10-CM | POA: Diagnosis not present

## 2022-04-17 DIAGNOSIS — Z66 Do not resuscitate: Secondary | ICD-10-CM | POA: Diagnosis not present

## 2022-04-17 LAB — CBC
HCT: 29.6 % — ABNORMAL LOW (ref 36.0–46.0)
Hemoglobin: 8.9 g/dL — ABNORMAL LOW (ref 12.0–15.0)
MCH: 26.6 pg (ref 26.0–34.0)
MCHC: 30.1 g/dL (ref 30.0–36.0)
MCV: 88.6 fL (ref 80.0–100.0)
Platelets: 68 10*3/uL — ABNORMAL LOW (ref 150–400)
RBC: 3.34 MIL/uL — ABNORMAL LOW (ref 3.87–5.11)
RDW: 18 % — ABNORMAL HIGH (ref 11.5–15.5)
WBC: 14.1 10*3/uL — ABNORMAL HIGH (ref 4.0–10.5)
nRBC: 0.2 % (ref 0.0–0.2)

## 2022-04-17 LAB — COMPREHENSIVE METABOLIC PANEL
ALT: 23 U/L (ref 0–44)
AST: 58 U/L — ABNORMAL HIGH (ref 15–41)
Albumin: 2.7 g/dL — ABNORMAL LOW (ref 3.5–5.0)
Alkaline Phosphatase: 57 U/L (ref 38–126)
Anion gap: 10 (ref 5–15)
BUN: 9 mg/dL (ref 8–23)
CO2: 23 mmol/L (ref 22–32)
Calcium: 8.1 mg/dL — ABNORMAL LOW (ref 8.9–10.3)
Chloride: 111 mmol/L (ref 98–111)
Creatinine, Ser: 0.59 mg/dL (ref 0.44–1.00)
GFR, Estimated: >60 mL/min (ref >60)
Glucose, Bld: 118 mg/dL — ABNORMAL HIGH (ref 70–99)
Potassium: 3.4 mmol/L — ABNORMAL LOW (ref 3.5–5.1)
Sodium: 144 mmol/L (ref 135–145)
Total Bilirubin: 1.2 mg/dL (ref 0.3–1.2)
Total Protein: 5.2 g/dL — ABNORMAL LOW (ref 6.5–8.1)

## 2022-04-17 LAB — PHOSPHORUS: Phosphorus: 2 mg/dL — ABNORMAL LOW (ref 2.5–4.6)

## 2022-04-17 LAB — MAGNESIUM: Magnesium: 1.3 mg/dL — ABNORMAL LOW (ref 1.7–2.4)

## 2022-04-17 MED ORDER — OLANZAPINE 5 MG PO TBDP
2.5000 mg | ORAL_TABLET | Freq: Every day | ORAL | Status: DC
  Administered 2022-04-17: 2.5 mg via ORAL
  Filled 2022-04-17: qty 0.5

## 2022-04-17 MED ORDER — MAGNESIUM SULFATE 4 GM/100ML IV SOLN
4.0000 g | Freq: Once | INTRAVENOUS | Status: AC
  Administered 2022-04-17: 4 g via INTRAVENOUS
  Filled 2022-04-17: qty 100

## 2022-04-17 MED ORDER — POTASSIUM PHOSPHATES 15 MMOLE/5ML IV SOLN
30.0000 mmol | Freq: Once | INTRAVENOUS | Status: AC
  Administered 2022-04-17: 30 mmol via INTRAVENOUS
  Filled 2022-04-17: qty 10

## 2022-04-17 MED ORDER — POTASSIUM PHOSPHATES 15 MMOLE/5ML IV SOLN
45.0000 mmol | Freq: Once | INTRAVENOUS | Status: DC
Start: 2022-04-17 — End: 2022-04-17

## 2022-04-17 MED ORDER — POTASSIUM CHLORIDE CRYS ER 20 MEQ PO TBCR
60.0000 meq | EXTENDED_RELEASE_TABLET | Freq: Once | ORAL | Status: AC
  Administered 2022-04-17: 60 meq via ORAL
  Filled 2022-04-17: qty 3

## 2022-04-17 NOTE — Progress Notes (Signed)
Physical Therapy Treatment Patient Details Name: Tracy Davidson MRN: [id number] DOB: 1940/10/23 Today's Date: 04/17/2022   History of Present Illness 81 y.o. female presented with worsening of generalized weakness after 2 week loss of appetite 6/28. Found to be hypokalemic and hypomagnesemic, Hgb 6.6 PMH: right-sided lung cancer status post radiation therapy 2022, HTN, PAF on Eliquis, PAD,    PT Comments    Pt supine in bed on entry, more alert than last session. Continues to be limited in safe mobility by profound weakness. With HoB elevated pt able to come to EoB with min guard, however requires totalAx2 for coming to upright and stepping to recliner. Once in recliner performed seated exercise. Lacks eccentric control with long arc quads and after straightening knee, unable to control bending knee allowing LE to drop back down. D/c plans remain appropriate at this time. PT will continue to follow acutely.    Recommendations for follow up therapy are one component of a multi-disciplinary discharge planning process, led by the attending physician.  Recommendations may be updated based on patient status, additional functional criteria and insurance authorization.  Follow Up Recommendations  Skilled nursing-short term rehab (<3 hours/day) Can patient physically be transported by private vehicle: No   Assistance Recommended at Discharge Frequent or constant Supervision/Assistance  Patient can return home with the following A lot of help with walking and/or transfers;A lot of help with bathing/dressing/bathroom;Assistance with cooking/housework;Direct supervision/assist for medications management;Direct supervision/assist for financial management;Assist for transportation;Help with stairs or ramp for entrance   Equipment Recommendations  None recommended by PT       Precautions / Restrictions Precautions Precautions: Fall Restrictions Weight Bearing Restrictions: No     Mobility  Bed  Mobility Overal bed mobility: Needs Assistance Bed Mobility: Supine to Sit     Supine to sit: Min guard, HOB elevated     General bed mobility comments: min guard for safety, maximal cuing for hand placement and use of bed rail to come to EoB    Transfers Overall transfer level: Needs assistance Equipment used: Rolling walker (2 wheels) Transfers: Sit to/from Stand, Bed to chair/wheelchair/BSC Sit to Stand: +2 physical assistance, Total assist, From elevated surface     Squat pivot transfers: Total assist, Max assist, +2 physical assistance     General transfer comment: lacks strength to come to fully upright, attempt x3 before pt could stand and maintain upright enough to step to chair, maximal cuing for coming all the way to upright before trying to step to recliner,    Ambulation/Gait               General Gait Details: unable       Balance Overall balance assessment: Needs assistance Sitting-balance support: Feet supported, Feet unsupported, No upper extremity supported, Bilateral upper extremity supported, Single extremity supported Sitting balance-Leahy Scale: Fair     Standing balance support: Bilateral upper extremity supported, During functional activity Standing balance-Leahy Scale: Zero                              Cognition Arousal/Alertness: Awake/alert Behavior During Therapy: Flat affect (soft spoken) Overall Cognitive Status: Impaired/Different from baseline Area of Impairment: Problem solving, Safety/judgement                         Safety/Judgement: Decreased awareness of safety   Problem Solving: Requires verbal cues, Requires tactile cues, Slow processing, Decreased initiation General Comments:  continues to be slowed overall in response to questions and commands, as well as initiation        Exercises General Exercises - Lower Extremity Ankle Circles/Pumps: AROM, Both, 10 reps Gluteal Sets: AROM, Both, 5 reps,  Seated Long Arc Quad: AROM, Both, 5 reps, Seated Hip ABduction/ADduction: AROM, Both, 5 reps, Seated (resisted by PT) Hip Flexion/Marching: AROM, Both, 5 reps, Seated    General Comments General comments (skin integrity, edema, etc.): increase in HR with mobility, on RA      Pertinent Vitals/Pain Pain Assessment Pain Assessment: Faces Faces Pain Scale: Hurts little more Pain Location: generalized with movement Pain Descriptors / Indicators: Discomfort Pain Intervention(s): Limited activity within patient's tolerance, Monitored during session, Repositioned     PT Goals (current goals can now be found in the care plan section) Acute Rehab PT Goals Patient Stated Goal: none stated PT Goal Formulation: With patient Time For Goal Achievement: 04/28/22 Potential to Achieve Goals: Fair Progress towards PT goals: Progressing toward goals    Frequency    Min 2X/week      PT Plan Current plan remains appropriate       AM-PAC PT "6 Clicks" Mobility   Outcome Measure  Help needed turning from your back to your side while in a flat bed without using bedrails?: A Lot Help needed moving from lying on your back to sitting on the side of a flat bed without using bedrails?: Total Help needed moving to and from a bed to a chair (including a wheelchair)?: Total Help needed standing up from a chair using your arms (e.g., wheelchair or bedside chair)?: Total Help needed to walk in hospital room?: Total Help needed climbing 3-5 steps with a railing? : Total 6 Click Score: 7    End of Session Equipment Utilized During Treatment: Gait belt Activity Tolerance: Patient tolerated treatment well Patient left: in chair;with call bell/phone within reach;with chair alarm set;with family/visitor present Nurse Communication: Mobility status PT Visit Diagnosis: Unsteadiness on feet (R26.81);Other abnormalities of gait and mobility (R26.89);Muscle weakness (generalized) (M62.81);Difficulty in  walking, not elsewhere classified (R26.2);Pain;Adult, failure to thrive (R62.7) Pain - part of body:  (back)     Time: 1116-1140 PT Time Calculation (min) (ACUTE ONLY): 24 min  Charges:  $Therapeutic Exercise: 8-22 mins $Therapeutic Activity: 8-22 mins                     Alisha Burgo B. Migdalia Dk PT, DPT Acute Rehabilitation Services Please use secure chat or  Call Office [phone]    [place] 04/17/2022, 12:43 PM

## 2022-04-17 NOTE — TOC Transition Note (Signed)
Transition of Care [facility]) - CM/SW Discharge Note   Patient Details  Name: Tracy Davidson MRN: [id number] Date of Birth: July 28, 1941  Transition of Care [facility]) CM/SW Contact:  Vinie Sill, LCSW Phone Number: 04/17/2022, 4:56 PM   Clinical Narrative:     Patient care in progress at the time of CSW visit- unable to provide bed offers at this time.  Final next level of care: Skilled Nursing Facility Barriers to Discharge: Continued Medical Work up, Ship broker, SNF Pending bed offer   Patient Goals and CMS Choice Patient states their goals for this hospitalization and ongoing recovery are:: Get into rehab and return home      Discharge Placement                       Discharge Plan and Services                                     Social Determinants of Health (SDOH) Interventions     Readmission Risk Interventions     No data to display

## 2022-04-17 NOTE — Progress Notes (Signed)
Progress Note   Patient: Tracy Davidson DOB: 1941-06-28 DOA: 04/12/2022     5 DOS: the patient was seen and examined on 04/17/2022   Brief hospital course: 81 y.o. 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   Her symptoms started about 2 weeks ago, patient patient lost appetite.  Since then, patient has been only on Ensure 2-3 times a day.  She denied any feeling of nauseous vomiting abdominal pain, she does have occasional diarrhea but she described as mild and loose.  No fever or chills.  Also denied any trouble swallowing, no choking or cough after eating or drinking.  She estimated herself "lost a few pounds in last weeks".  This week, she felt extremely weak and could not even stand up from her chair, she denied any one-sided weakness or numbness.  No fall.  She denied any blood in the stool or dark-colored stool.  Pt was found to have hgb of 6.6 with heme pos stools  Assessment and Plan: Severe hypokalemia and hypomagnesemia -Likely secondary to poor oral intake, with concurrent severe hypomagnesemia. -Potassium remains low -cont to replace -Recheck bmet in AM  Hypophosphatemia -remains low, will replace   Severe dehydration -Clinically, patient has symptoms and signs of volume contraction probably from poor oral intake.   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 Recs to continue iron supplementation indefinitely, and OK to resume anticoagulation -Tolerating anticoagulation at this time   Hypercalcemia -Suspect this is more likely related to  dehydration -Given NS IVF hydration -Ca now normalized after zometa x 1 -continue to follow BMET trends   Question of dysphagia -Nurse at ED reported that patient appeared to have hard time swallowing pills, given the history of lung cancer and radiation   Leukocytosis -No cough, no diarrhea no dysuria, chest x-ray clear, UA clear -Remains afebrile   Severe protein calorie malnutrition -Has had anorexia, no clear etiology.  Expect GI will perform EGD to rule out any organic reasons.  Speech evaluation underway to rule out any dysphagia. -Given trial of Marinol   Right-sided lung CA -Status post radiation therapy, repeat CT scan 2 months ago showed shrinking size of the tumor. Outpatient follow-up with oncology and CT surgery for elective lobectomy.   PAF -Rate controlled -resume eliquis, cleared to resume anticoagulation per GI   HTN -cont lisinopril -Given dehydration, in setting of hyper calcemia, hold HCTZ -BP stable  Constipation -Pt reports hx of BM 1-2 times per week PTA -Now agreeable to miralax, continue daily miralax -Recent pelvic xray with large stool burden noted -Large BM noted overnight. Cont bowel regimen as needed  Citrobacter braakii bacturia -Pt does have lower abd discomfort, however does have constipation per above -UA is unremarkable -Pt remains afebrile -blood cx thus far neg -hold off on abx at this time  End of Life, DNR -Appreciate assistance by Palliative Care -Pt now noted to be DNR -Recommendation for trial of zyprexa     Subjective: Without complaints. Reports feeling better today  Physical Exam: Vitals:   04/16/22 0949 04/16/22 2134 04/17/22 0634 04/17/22 0900  BP:  135/77 135/80 (!) 154/91 (!) 155/90  Pulse: 100 (!) 107 (!) 113 (!) 109  Resp: 16 19 20 20   Temp: 98.3 F (36.8 C) 98.8 F (37.1 C) 98.5 F (36.9 C) 98.5 F (36.9 C)  TempSrc:  Oral Oral Oral  SpO2: 99% 100% 100% 99%  Weight:      Height:       General exam:  Awake, laying in bed, in nad Respiratory system: Normal respiratory effort, no wheezing Cardiovascular system: regular rate, s1, s2 Gastrointestinal system: Soft, nondistended, positive BS Central nervous system: CN2-12 grossly intact, strength intact Extremities: Perfused, no clubbing Skin: Normal skin turgor, no notable skin lesions seen Psychiatry: Mood normal // no visual hallucinations   Data Reviewed:  Labs reviewed: K 3.4, corrected Ca 9.14, Cr 0.59  Family Communication: Pt in room, pt's friend at bedside  Disposition: Status is: Inpatient Remains inpatient appropriate because: Severity of illness  Planned Discharge Destination: Skilled nursing facility    Author: Marylu Lund, MD 04/17/2022 4:38 PM  For on call review [email or web address].

## 2022-04-17 NOTE — Progress Notes (Signed)
Palliative:  HPI: 81 y.o. female  with past medical history of right-sided lung cancer s/p radiation 2022, HTN, PAF on Eliquis, PAD, HLD, GERD admitted on 04/12/2022 with weakness and poor appetite only taking Ensure. Treating for severe dehydration with hypokalemia and anemia. 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 EGD negative.   I met today with Ms. Adamik. No family or visitors at bedside. She is awake and alert. Continued fatigue and weakness. She tells me that she feels a little better today and was able to eat a little more. RN reports she ate almost 50% of her lunch. I spoke with her further about her decline and symptoms. She denies pain except in her knees when she tried to stand and walk. LBM today. We discuss possible depression knowing that she has had a difficult time since radiation and she shares that she may have had some depression during treatment but does not feel depressed now. She does endorse insomnia and continued poor appetite although she made herself eat today. She is open to medication to help with symptoms.   I reviewed with her our conversation from yesterday as I know these topics can be overwhelming. She acknowledges that it is difficult to think about but also important. I discussed with her more about code status and what this means and entails. After conversation she tells me that this is not something she wishes to go through and agrees with DNR status. Otherwise she wishes to continue with care, treatment, and rehab to try and have some level of improvement. She confirms with me again that it is okay for [place] to speak openly in the presence of her friend [name].   All questions/concerns addressed. Emotional support provided. Discussed with RN and Dr. Wyline Copas.   Exam: Alert, oriented. Generalized fatigue and severe weakness. No distress. Breathing regular, unlabored. Abd soft/flat.   Plan: - DNR decided.  - Insomnia/poor  intake: QTc ordered and reviewed 444 so will add Zyprexa 2.5 mg qhs.  - Continue treatment with goal for rehab when medically ready.   [place], NP Palliative Medicine Team Pager [phone] (Please see [email or web address] for schedule) Team Phone [phone]    Greater than 50%  of this time was spent counseling and coordinating care related to the above assessment and plan

## 2022-04-18 DIAGNOSIS — R531 Weakness: Secondary | ICD-10-CM | POA: Diagnosis not present

## 2022-04-18 DIAGNOSIS — Z515 Encounter for palliative care: Secondary | ICD-10-CM | POA: Diagnosis not present

## 2022-04-18 DIAGNOSIS — Z7189 Other specified counseling: Secondary | ICD-10-CM | POA: Diagnosis not present

## 2022-04-18 DIAGNOSIS — E876 Hypokalemia: Secondary | ICD-10-CM | POA: Diagnosis not present

## 2022-04-18 DIAGNOSIS — R627 Adult failure to thrive: Secondary | ICD-10-CM | POA: Diagnosis not present

## 2022-04-18 DIAGNOSIS — K922 Gastrointestinal hemorrhage, unspecified: Secondary | ICD-10-CM | POA: Diagnosis not present

## 2022-04-18 LAB — COMPREHENSIVE METABOLIC PANEL
ALT: 21 U/L (ref 0–44)
AST: 43 U/L — ABNORMAL HIGH (ref 15–41)
Albumin: 2.4 g/dL — ABNORMAL LOW (ref 3.5–5.0)
Alkaline Phosphatase: 54 U/L (ref 38–126)
Anion gap: 10 (ref 5–15)
BUN: 8 mg/dL (ref 8–23)
CO2: 24 mmol/L (ref 22–32)
Calcium: 7.7 mg/dL — ABNORMAL LOW (ref 8.9–10.3)
Chloride: 111 mmol/L (ref 98–111)
Creatinine, Ser: 0.54 mg/dL (ref 0.44–1.00)
GFR, Estimated: >60 mL/min (ref >60)
Glucose, Bld: 133 mg/dL — ABNORMAL HIGH (ref 70–99)
Potassium: 3 mmol/L — ABNORMAL LOW (ref 3.5–5.1)
Sodium: 145 mmol/L (ref 135–145)
Total Bilirubin: 0.9 mg/dL (ref 0.3–1.2)
Total Protein: 4.9 g/dL — ABNORMAL LOW (ref 6.5–8.1)

## 2022-04-18 LAB — PHOSPHORUS: Phosphorus: 2.5 mg/dL (ref 2.5–4.6)

## 2022-04-18 LAB — CBC
HCT: 25 % — ABNORMAL LOW (ref 36.0–46.0)
Hemoglobin: 7.9 g/dL — ABNORMAL LOW (ref 12.0–15.0)
MCH: 27.3 pg (ref 26.0–34.0)
MCHC: 31.6 g/dL (ref 30.0–36.0)
MCV: 86.5 fL (ref 80.0–100.0)
Platelets: 59 10*3/uL — ABNORMAL LOW (ref 150–400)
RBC: 2.89 MIL/uL — ABNORMAL LOW (ref 3.87–5.11)
RDW: 17.8 % — ABNORMAL HIGH (ref 11.5–15.5)
WBC: 11.2 10*3/uL — ABNORMAL HIGH (ref 4.0–10.5)
nRBC: 0.2 % (ref 0.0–0.2)

## 2022-04-18 LAB — MAGNESIUM: Magnesium: 2 mg/dL (ref 1.7–2.4)

## 2022-04-18 MED ORDER — OLANZAPINE 5 MG PO TBDP
2.5000 mg | ORAL_TABLET | Freq: Every day | ORAL | Status: DC
  Administered 2022-04-18 – 2022-04-21 (×4): 2.5 mg via ORAL
  Filled 2022-04-18 (×5): qty 0.5

## 2022-04-18 MED ORDER — POTASSIUM CHLORIDE CRYS ER 20 MEQ PO TBCR
60.0000 meq | EXTENDED_RELEASE_TABLET | Freq: Four times a day (QID) | ORAL | Status: AC
  Administered 2022-04-18 (×2): 60 meq via ORAL
  Filled 2022-04-18 (×2): qty 3

## 2022-04-18 NOTE — Progress Notes (Addendum)
Palliative:  HPI: 80 y.o. female  with past medical history of right-sided lung cancer s/p radiation 2022, HTN, PAF on Eliquis, PAD, HLD, GERD admitted on 04/12/2022 with weakness and poor appetite only taking Ensure. Treating for severe dehydration with hypokalemia and anemia. 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 EGD negative. Overall failure to thrive.   I met again today with Tracy Davidson after reviewing records/notes, vital signs, medications, and diagnostics. She is very sleepy still but easily awakens and speaks with me. She reports that she did sleep better last night. Discussed continuing trial of Zyprexa to see how it helps with her appetite although I will schedule it a little earlier in the evening to avoid increased sleepiness in the morning and she agrees. I encouraged her to try and eat some of her breakfast and to do better with her lunch. She has no complaints. She has not further questions or concerns. Goals are unchanged from yesterday. Discussed plans with RN.   Exam: Alert, oriented. Generalized fatigue and severe weakness. No distress. Breathing regular, unlabored. Abd soft/flat.   Plan: - DNR.  - Insomnia/poor appetite: Continue trial Zyprexa 2.5 mg - give at 8pm.  - Continue with treatment with hopes to pursue rehab stay.   25 min   , NP Palliative Medicine Team Pager [phone] (Please see [email or web address] for schedule) Team Phone [phone]    Greater than 50%  of this time was spent counseling and coordinating care related to the above assessment and plan   

## 2022-04-18 NOTE — Progress Notes (Signed)
Mobility Specialist: Progress Note   04/18/22 1638  Mobility  Activity Stood at bedside  Level of Assistance Maximum assist, patient does 25-49%  Assistive Device Stedy  Activity Response Tolerated fair  $Mobility charge 1 Mobility   Pt received in bed and agreeable to mobility. Required modA-maxA with bed mobility and maxA to stand. C/o BUE soreness, otherwise no c/o. Attempted to stand x2 but unable to complete stand on either attempt. Pt bowel incontinent upon standing, assisted with pericare with help from NT. Pt back in bed with NT present in the room.  [facility] Kimberly Coye Mobility Specialist Mobility Specialist [address]: [phone]

## 2022-04-18 NOTE — TOC Progression Note (Signed)
Transition of Care [facility]) - Progression Note    Patient Details  Name: LEYANI GARGUS MRN: [id number] Date of Birth: 1941/01/15  Transition of Care [facility]) CM/SW [place], [place] Phone Number: 04/18/2022, 12:29 PM  Clinical Narrative:     CSW met with patient at bedside. CSW introduced self and explained role. Patient remains agreeable to short term rehab at [facility] and accepted bed offer with [facility].   CSW informed SNF patient has accepted bed offer and once medially stable requested to discharge there for short term rehab.   TOC will continue to follow and assist with discharge planning.  Thurmond Butts, MSW, LCSW Clinical Social Worker    Expected Discharge Plan: Skilled Nursing Facility Barriers to Discharge: Continued Medical Work up, Ship broker, SNF Pending bed offer  Expected Discharge Plan and Services Expected Discharge Plan: [place] arrangements for the past 2 months: Single Family Home                                       Social Determinants of Health (SDOH) Interventions    Readmission Risk Interventions     No data to display

## 2022-04-18 NOTE — Progress Notes (Signed)
Progress Note   Patient: Tracy Davidson DVV:[id number] DOB: Feb 17, 1941 DOA: 04/12/2022     6 DOS: the patient was seen and examined on 04/18/2022   Brief hospital course: 81 y.o. 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   Her symptoms started about 2 weeks ago, patient patient lost appetite.  Since then, patient has been only on Ensure 2-3 times a day.  She denied any feeling of nauseous vomiting abdominal pain, she does have occasional diarrhea but she described as mild and loose.  No fever or chills.  Also denied any trouble swallowing, no choking or cough after eating or drinking.  She estimated herself "lost a few pounds in last weeks".  This week, she felt extremely weak and could not even stand up from her chair, she denied any one-sided weakness or numbness.  No fall.  She denied any blood in the stool or dark-colored stool.  Pt was found to have hgb of 6.6 with heme pos stools  Assessment and Plan: Severe hypokalemia and hypomagnesemia -Likely secondary to poor oral intake, with concurrent severe hypomagnesemia. -Potassium remains low -Will replace -Recheck bmet in AM  Hypophosphatemia -replaced   Severe dehydration -Clinically, patient has symptoms and signs of volume contraction probably from poor oral intake.   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 Recs to continue iron supplementation indefinitely, and OK to resume anticoagulation -Tolerating anticoagulation at this time   Hypercalcemia -Suspect this is more likely related to dehydration -Given NS IVF  hydration -Ca now normalized after zometa x 1 -continue to follow BMET trends   Question of dysphagia -Nurse at ED reported that patient appeared to have hard time swallowing pills, given the history of lung cancer and radiation -Seen by SLP with recs for soft diet   Leukocytosis -No cough, no diarrhea no dysuria, chest x-ray clear, UA clear -Remains afebrile   Severe protein calorie malnutrition -Tolerating some po -Given trial of Marinol   Right-sided lung CA -Status post radiation therapy, repeat CT scan 2 months ago showed shrinking size of the tumor. Outpatient follow-up with oncology and CT surgery for elective lobectomy.   PAF -Rate controlled -resume eliquis, cleared to resume anticoagulation per GI   HTN -cont lisinopril -Given dehydration, in setting of hyper calcemia, hold HCTZ -BP stable  Constipation -Pt reports hx of BM 1-2 times per week PTA -Now agreeable to miralax, continue daily miralax -Recent pelvic xray with large stool burden noted -Large BM noted recently. Cont bowel regimen as needed  Citrobacter braakii bacturia -Pt does have lower abd discomfort, however does have constipation per above -UA is unremarkable -Pt remains afebrile -blood cx thus far neg -hold off on abx at this time  End of Life, DNR -Appreciate assistance by Palliative Care -Pt now noted to be DNR -Recommendation for trial of zyprexa     Subjective: Reports feeling somewhat better today. Not eating very much. Denies nausea  Physical Exam: Vitals:   04/17/22 0900 04/17/22 2133 04/18/22 0530 04/18/22 0859  BP: (!) 155/90 (!) 146/81 (!) 145/77 (!) 142/78  Pulse: (!) 109 100 (!) 102  97  Resp: 20 17 16 17   Temp: 98.5 F (36.9 C) 98.4 F (36.9 C) 97.9 F (36.6 C) 97.7 F (36.5 C)  TempSrc: Oral Oral Oral Oral  SpO2: 99% 100% 100% 100%  Weight:      Height:       General exam: Conversant, in no acute distress Respiratory system: normal chest rise, clear, no audible  wheezing Cardiovascular system: regular rhythm, s1-s2 Gastrointestinal system: Nondistended, nontender, pos BS Central nervous system: No seizures, no tremors Extremities: No cyanosis, no joint deformities Skin: No rashes, no pallor Psychiatry: Affect normal // no auditory hallucinations   Data Reviewed:  Labs reviewed: K 3.0,  Cr 0.54, Mg 2.0  Family Communication: Pt in room, pt's friend at bedside  Disposition: Status is: Inpatient Remains inpatient appropriate because: Severity of illness  Planned Discharge Destination: Skilled nursing facility    Author: Marylu Lund, MD 04/18/2022 4:07 PM  For on call review [email or web address].

## 2022-04-18 NOTE — Plan of Care (Signed)
  Problem: Clinical Measurements: Goal: Respiratory complications will improve Outcome: Progressing Goal: Cardiovascular complication will be avoided Outcome: Progressing   Problem: Elimination: Goal: Will not experience complications related to urinary retention Outcome: Progressing   Problem: Pain Managment: Goal: General experience of comfort will improve Outcome: Progressing

## 2022-04-19 DIAGNOSIS — R627 Adult failure to thrive: Secondary | ICD-10-CM | POA: Diagnosis not present

## 2022-04-19 DIAGNOSIS — Z7189 Other specified counseling: Secondary | ICD-10-CM | POA: Diagnosis not present

## 2022-04-19 DIAGNOSIS — Z515 Encounter for palliative care: Secondary | ICD-10-CM | POA: Diagnosis not present

## 2022-04-19 MED ORDER — ENSURE ENLIVE PO LIQD
237.0000 mL | Freq: Four times a day (QID) | ORAL | Status: DC
  Administered 2022-04-19 – 2022-04-22 (×10): 237 mL via ORAL

## 2022-04-19 MED ORDER — POTASSIUM CHLORIDE CRYS ER 20 MEQ PO TBCR: 60.0000 meq | EXTENDED_RELEASE_TABLET | ORAL | Status: DC

## 2022-04-19 MED ORDER — DRONABINOL 2.5 MG PO CAPS
5.0000 mg | ORAL_CAPSULE | Freq: Two times a day (BID) | ORAL | Status: DC
  Administered 2022-04-19 – 2022-04-22 (×6): 5 mg via ORAL
  Filled 2022-04-19 (×6): qty 2

## 2022-04-19 MED ORDER — POTASSIUM CHLORIDE 20 MEQ PO PACK
60.0000 meq | PACK | ORAL | Status: AC
  Administered 2022-04-19 (×2): 60 meq via ORAL
  Filled 2022-04-19 (×2): qty 3

## 2022-04-19 NOTE — Progress Notes (Signed)
[facility] Progress Note  Patient: Tracy Davidson     IOM:[id number]  DOA: 04/12/2022   PCP: Billie Ruddy, MD       Brief hospital course: 81 year old female with right lung cancer status postradiation in 2022, hypertension, PAF, PAD external iliac stent who presented to the hospital with loss of appetite and generalized weakness.  She is now unable to ambulate. Regards to her lung cancer, the patient was diagnosed in 05/2021, underwent debulking and cryotherapy followed by radiation. F/u CT on 01/20/22 showed decrease in size of the cancer.  Hemoglobin noted to be 6.6 with heme positive stools. 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  GI was unable to do a colonoscopy due to the patient's frailty. She continues to have a very poor oral intake in regards to solid food.  She is being prescribed Ensure twice daily but is drinking only 40 to 50% of these.  She appears to have a poor understanding that she is not eating enough to survive long-term.  Subjective:  She has no complaints. She tells me she is waiting to go to rehab so she can get stronger. We have discussed her lack of oral intake and that I cannot discharge her to SNF if she does not eat. She appears to understand.   Assessment and Plan: Principal Problem: Loss of appetite with Malnutrition of moderate degree -Continue Ensure-will increase to 4 times daily - double Marinol from 2.5 BID to 5 mg BID - cont palliative care discussions- started on Zyprexa by palliative care for appetitie - cont Protonix daily - stop IVF to see if she will drink fluids    Hypokalemia -Due to poor oral intake and HCTZ (on hold) - Continue to replace and continue to follow levels regularly  Active Problems:   Symptomatic anemia   Heme positive stool -See above discussion - Hemoglobin is ranging from 7-9-continue to follow  Stage IIb non-small cell  cancer - History of right middle lobe central lung mass and right middle lobe and right lower lobe pulmonary nodules - Status post radiation  PAF - she is tachycardic on exam - check EKG - cont Eliquis  HTN - Lisinopril - holding HCTZ   DVT prophylaxis:  Eliquis   Code Status: DNR  Consultants: palliative care Level of Care: Level of care: Telemetry Medical Disposition Plan:  Status is: Inpatient Remains inpatient appropriate because: poor oral intake-   Objective:   Vitals:   04/18/22 0859 04/18/22 1637 04/18/22 2104 04/19/22 0810  BP: (!) 142/78 (!) 148/82 140/79 139/80  Pulse: 97 (!) 108 (!) 106 (!) 105  Resp: 17 20 20 20   Temp: 97.7 F (36.5 C) 98.6 F (37 C) 98.6 F (37 C) 98.2 F (36.8 C)  TempSrc: Oral  Oral Oral  SpO2: 100% 100% 100% 99%  Weight:      Height:       Filed Weights   04/12/22 1749 04/15/22 0533  Weight: 50.6 kg 54.4 kg   Exam: General exam: Appears comfortable  HEENT: PERRLA, oral mucosa moist, no sclera icterus or thrush Respiratory system: Clear to auscultation. Respiratory effort normal. Cardiovascular system: S1 & S2 heard, regular rate and rhythm Gastrointestinal system: Abdomen soft, non-tender, nondistended. Normal bowel sounds   Central nervous system: Alert and oriented. No focal neurological deficits. Extremities: No cyanosis, clubbing or edema Skin: No rashes or ulcers Psychiatry:  Mood & affect  appropriate.    Imaging and lab data was personally reviewed    CBC: Recent Labs  Lab 04/13/22 1058 04/14/22 0026 04/15/22 0411 04/17/22 0737 04/18/22 0151  WBC 12.8* 13.9* 11.0* 14.1* 11.2*  HGB 8.8* 9.1* 7.5* 8.9* 7.9*  HCT 28.3* 29.5* 23.8* 29.6* 25.0*  MCV 85.2 86.8 84.4 88.6 86.5  PLT 116* 107* 81* 68* 59*   Basic Metabolic Panel: Recent Labs  Lab 04/12/22 2048 04/13/22 0244 04/13/22 1058 04/14/22 0026 04/15/22 0411 04/16/22 0135 04/17/22 0737 04/18/22 0151  NA 143   < >  --  143 140 145 144 145  K 2.7*   <  >  --  3.4* 3.0* 2.7* 3.4* 3.0*  CL 102   < >  --  104 108 110 111 111  CO2 31   < >  --  27 27 25 23 24   GLUCOSE 142*   < >  --  128* 115* 104* 118* 133*  BUN 13   < >  --  14 12 12 9 8   CREATININE 0.52   < >  --  0.63 0.66 0.60 0.59 0.54  CALCIUM 11.2*   < >  --  11.3* 9.1 9.0 8.1* 7.7*  MG 1.5*  --  1.6*  --  1.3* 2.3 1.3* 2.0  PHOS 1.8*  --   --   --   --  1.3* 2.0* 2.5   < > = values in this interval not displayed.   GFR: Estimated Creatinine Clearance: 46.7 mL/min (by C-G formula based on SCr of 0.54 mg/dL).  Scheduled Meds:  apixaban  2.5 mg Oral BID   atorvastatin  10 mg Oral Daily   dronabinol  2.5 mg Oral BID AC   feeding supplement  237 mL Oral BID BM   lisinopril  20 mg Oral Daily   multivitamin with minerals  1 tablet Oral Daily   OLANZapine zydis  2.5 mg Oral QHS   pantoprazole  40 mg Oral Daily   polyethylene glycol  17 g Oral Daily   Continuous Infusions:  sodium chloride 100 mL/hr at 04/19/22 0752     LOS: 7 days   Author: Debbe Odea  04/19/2022 3:31 PM

## 2022-04-19 NOTE — Progress Notes (Signed)
This chaplain responded to PMT consult for spiritual care. The Pt. is awake and neatly tucked into bed. The Pt. declines a visit today and is considering a chaplain visit tomorrow.   The chaplain inquired about the Pt. day. The Pt. shares "my stomach was upset, but it seems to be okay now." The Pt. is appreciative of the visit.  Chaplain Sallyanne Kuster [phone]

## 2022-04-19 NOTE — Progress Notes (Signed)
Occupational Therapy Treatment Patient Details Name: Tracy Davidson MRN: [id number] DOB: October 31, 1940 Today's Date: 04/19/2022   History of present illness 81 y.o. female presented with worsening of generalized weakness after 2 week loss of appetite 6/28. Found to be hypokalemic and hypomagnesemic, Hgb 6.6 PMH: right-sided lung cancer status post radiation therapy 2022, HTN, PAF on Eliquis, PAD,   OT comments  Pt still needing extensive assistance for sit to stand and standing.  During session integrated Stedy to assist with max +2 needed to complete standing for intervals of up to 1 min X4.  Max +2 needed for toileting tasks in standing also.  Feel she will continue to benefit from acute care OT to progress ADL independence and overall transfer ability however extensive rehab will be needed at SNF level for her to return home.     Recommendations for follow up therapy are one component of a multi-disciplinary discharge planning process, led by the attending physician.  Recommendations may be updated based on patient status, additional functional criteria and insurance authorization.    Follow Up Recommendations  Skilled nursing-short term rehab (<3 hours/day)    Assistance Recommended at Discharge Frequent or constant Supervision/Assistance  Patient can return home with the following  Two people to help with walking and/or transfers;Two people to help with bathing/dressing/bathroom;Direct supervision/assist for medications management;Assist for transportation;Assistance with cooking/housework;Direct supervision/assist for financial management   Equipment Recommendations  None recommended by OT;Other (comment) (TBD next venue of care)       Precautions / Restrictions Precautions Precautions: Fall Restrictions Weight Bearing Restrictions: No       Mobility Bed Mobility Overal bed mobility: Needs Assistance Bed Mobility: Supine to Sit, Sit to Supine     Supine to sit: HOB elevated,  Mod assist Sit to supine: Mod assist   General bed mobility comments: Increased assistance for scooting out to the EOB when sitting up and then assist for lifting the LEs back into the bed and getting straight.    Transfers Overall transfer level: Needs assistance Equipment used: Ambulation equipment used Charlaine Dalton) Transfers: Sit to/from Stand Sit to Stand: +2 physical assistance, From elevated surface, Max assist (use of Stedy)   Squat pivot transfers: Total assist, Max assist, +2 physical assistance         Transfer via Lift Equipment: Stedy   Balance Overall balance assessment: Needs assistance Sitting-balance support: Feet supported, Bilateral upper extremity supported Sitting balance-Leahy Scale: Poor Sitting balance - Comments: required bil UE support   Standing balance support: Bilateral upper extremity supported, During functional activity, Reliant on assistive device for balance Standing balance-Leahy Scale: Poor Standing balance comment: +2 assist for standing with stedy                           ADL either performed or assessed with clinical judgement   ADL Overall ADL's : Needs assistance/impaired                             Toileting- Clothing Manipulation and Hygiene: Maximal assistance;+2 for safety/equipment;Sit to/from stand Toileting - Clothing Manipulation Details (indicate cue type and reason): standing in the [place]       General ADL Comments: Pt still with increased difficulty standing requiring max +2 in the [place].  Only able to maintain for 30-60 seconds before resting.  Decreased hip and trunk extension noted with pt trying to prop on forearms instead of pushing up  with her hands.  HR in the low 100s in sitting increasing up to 130 with standing.  She declined sitting up OOB in the recliner thi session.               Cognition Arousal/Alertness: Awake/alert Behavior During Therapy: Flat affect Overall Cognitive Status:  Impaired/Different from baseline Area of Impairment: Problem solving                             Problem Solving: Slow processing, Requires tactile cues, Requires verbal cues General Comments: Slower than normal response with mod demonstrational cueing needed for correct hand placement on the Stedy with standing.                   Pertinent Vitals/ Pain       Pain Assessment Pain Assessment: No/denies pain Faces Pain Scale: No hurt         Frequency  Min 2X/week        Progress Toward Goals  OT Goals(current goals can now be found in the care plan section)  Progress towards OT goals: Progressing toward goals  Acute Rehab OT Goals Patient Stated Goal: Pt wants to get stronger and get back to home. OT Goal Formulation: With patient Time For Goal Achievement: 04/28/22 Potential to Achieve Goals: Good  Plan Discharge plan remains appropriate    Co-evaluation    PT/OT/SLP Co-Evaluation/Treatment: Yes     OT goals addressed during session: ADL's and self-care      AM-PAC OT "6 Clicks" Daily Activity     Outcome Measure   Help from another person eating meals?: None Help from another person taking care of personal grooming?: A Little Help from another person toileting, which includes using toliet, bedpan, or urinal?: Total Help from another person bathing (including washing, rinsing, drying)?: Total Help from another person to put on and taking off regular upper body clothing?: A Little Help from another person to put on and taking off regular lower body clothing?: Total 6 Click Score: 13    End of Session Equipment Utilized During Treatment: Other (comment) Charlaine Dalton)  OT Visit Diagnosis: Unsteadiness on feet (R26.81);Muscle weakness (generalized) (M62.81)   Activity Tolerance Patient tolerated treatment well   Patient Left in bed;with call bell/phone within reach;with bed alarm set   Nurse Communication Mobility status;Need for lift  equipment        Time: [phone] OT Time Calculation (min): 27 min  Charges: OT General Charges $OT Visit: 1 Visit OT Treatments $Self Care/Home Management : 8-22 mins  Kiora Hallberg OTR/L 04/19/2022, 2:09 PM

## 2022-04-19 NOTE — Progress Notes (Signed)
Palliative:  HPI: 81 y.o. female  with past medical history of right-sided lung cancer s/p radiation 2022, HTN, PAF on Eliquis, PAD, HLD, GERD admitted on 04/12/2022 with weakness and poor appetite only taking Ensure. Treating for severe dehydration with hypokalemia and anemia. 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 EGD negative. Overall failure to thrive.   I met today today initially with friend [name] as she was leaving. She believes that Ms. Bouler looks a little better today. [name] is trying to help her the best she is able. I did share with [name] that Ms. Artz had made the decision for DNR (Ms. Potenza has given me permission to share all information with [name] since she is her surrogate decision maker). [name] shares about trying to assist her to get her affairs in order and know her wishes regarding arrangements should anything happen.   I met today with Ms. Jaquess. She appears more bright-eyed and interactive today. Affect not as flat as previous visits. She continues to report that she is not sleeping well at night. She has some pain in abd under umbilicus area that is achy and dull but she says it is improving - not associated with intake. She has drank some fluids and eaten a small amount for lunch but reports only a few bites for breakfast. She is working on an Ensure.   I speak openly with Ms. Rubin about her concerning health status. I asked her if she continues to decline and get worse instead of better how she would feel about having help from hospice and she shares that she would be okay with this. I explain that sometimes patients reach a point where they become too tired and weak and they make the choice to focus more on their comfort and resting rather than pursuing ongoing treatment and rehab. I told her that it is okay if she felt this way now or into the future. She tells me that she wishes to continue to try and eat  and work with therapy at this time but will let us know if this changes. She expresses understanding of why patients may reach this point.   All questions/concerns addressed. Emotional support provided.   Exam: Alert, oriented. Cachectic, frail. Breathing regular, unlabored. Abd soft, nontender. Generalized weakness and fatigue.   Plan: - DNR. - Continue treatment with hopes to pursue rehab.  - Open to hospice in the future as indicated and educated that this is available to her if she chooses more comfort focused path.  - Continue Zyprexa 2.5 every night 8pm. Consider increase to 5 mg. Still with some insomnia, appetite slightly improved at times, flat affect improved. She seems in better overall spirits today.   [place], NP Palliative Medicine Team Pager [phone] (Please see [email or web address] for schedule) Team Phone [phone]    Greater than 50%  of this time was spent counseling and coordinating care related to the above assessment and plan

## 2022-04-19 NOTE — Progress Notes (Signed)
Mobility Specialist Criteria Algorithm Info.   04/19/22 1535  Mobility  Activity Refused mobility;Moved into chair position in bed  Range of Motion/Exercises Active;Passive  Level of Assistance Dependent, patient does less than 25%   Patient received in bed, deferred to participate in mobility. Stated she just returned to bed and was "worn out". Deferred bed mobility/exercises. Patient was repositioned and left with all needs met, call bell in reach.  04/19/2022 4:09 PM  [place] Quintasia Theroux, [place], [place]  RWCHJ:[phone] Office: [phone]

## 2022-04-19 NOTE — Progress Notes (Signed)
Physical Therapy Treatment Patient Details Name: Tracy Davidson MRN: [id number] DOB: February 06, 1941 Today's Date: 04/19/2022   History of Present Illness 81 y.o. female presented with worsening of generalized weakness after 2 week loss of appetite 6/28. Found to be hypokalemic and hypomagnesemic, Hgb 6.6 PMH: right-sided lung cancer status post radiation therapy 2022, HTN, PAF on Eliquis, PAD,    PT Comments    Patient remains limited by fatigue and generalized weakness. Requiring 2 person assist to come to standing with use of Stedy. Motivated to improve.    Recommendations for follow up therapy are one component of a multi-disciplinary discharge planning process, led by the attending physician.  Recommendations may be updated based on patient status, additional functional criteria and insurance authorization.  Follow Up Recommendations  Skilled nursing-short term rehab (<3 hours/day) Can patient physically be transported by private vehicle: No   Assistance Recommended at Discharge Frequent or constant Supervision/Assistance  Patient can return home with the following A lot of help with walking and/or transfers;A lot of help with bathing/dressing/bathroom;Assistance with cooking/housework;Direct supervision/assist for medications management;Direct supervision/assist for financial management;Assist for transportation;Help with stairs or ramp for entrance   Equipment Recommendations  None recommended by PT    Recommendations for Other Services       Precautions / Restrictions Precautions Precautions: Fall Restrictions Weight Bearing Restrictions: No     Mobility  Bed Mobility Overal bed mobility: Needs Assistance Bed Mobility: Supine to Sit, Sit to Supine     Supine to sit: HOB elevated, Mod assist Sit to supine: Mod assist   General bed mobility comments: incr time and effort, pt able to get to sitting with feet over EOB with minguard assist, however could not scoot forward to  get feet onto floor--needed mod assist; return to supine mod assist to raise legs onto bed    Transfers Overall transfer level: Needs assistance Equipment used: Ambulation equipment used Transfers: Sit to/from Stand Sit to Stand: +2 physical assistance, From elevated surface, Max assist           General transfer comment: stood from elevated bed or elevated seat of stedy x 4 reps total. Max standing ~55 seconds; requires facilitation for all aspects of sit to stand (anterior wt-shift, initiate lifting hips, completing lifting chest into full upright) Transfer via Lift Equipment: Stedy  Ambulation/Gait               General Gait Details: unable   Chief Strategy Officer    Modified Rankin (Stroke Patients Only)       Balance Overall balance assessment: Needs assistance Sitting-balance support: Feet supported, Bilateral upper extremity supported Sitting balance-Leahy Scale: Poor Sitting balance - Comments: required bil UE support   Standing balance support: Bilateral upper extremity supported, During functional activity Standing balance-Leahy Scale: Poor Standing balance comment: +2 assist for standing with stedy                            Cognition Arousal/Alertness: Awake/alert Behavior During Therapy: Flat affect (soft spoken) Overall Cognitive Status: Impaired/Different from baseline Area of Impairment: Problem solving                             Problem Solving: Requires verbal cues, Requires tactile cues, Slow processing, Decreased initiation General Comments: continues to be slowed overall in response to questions and commands,  as well as initiation        Exercises      General Comments General comments (skin integrity, edema, etc.): HR max 135      Pertinent Vitals/Pain Pain Assessment Pain Assessment: No/denies pain    Home Living                          Prior Function             PT Goals (current goals can now be found in the care plan section) Acute Rehab PT Goals Patient Stated Goal: agrees she wants to get stronger PT Goal Formulation: With patient Time For Goal Achievement: 04/28/22 Potential to Achieve Goals: Fair Progress towards PT goals: Progressing toward goals    Frequency    Min 2X/week      PT Plan Current plan remains appropriate    Co-evaluation PT/OT/SLP Co-Evaluation/Treatment: Yes Reason for Co-Treatment: For patient/therapist safety;To address functional/ADL transfers PT goals addressed during session: Mobility/safety with mobility;Balance;Proper use of DME        AM-PAC PT "6 Clicks" Mobility   Outcome Measure  Help needed turning from your back to your side while in a flat bed without using bedrails?: A Lot Help needed moving from lying on your back to sitting on the side of a flat bed without using bedrails?: A Lot Help needed moving to and from a bed to a chair (including a wheelchair)?: Total Help needed standing up from a chair using your arms (e.g., wheelchair or bedside chair)?: Total Help needed to walk in hospital room?: Total Help needed climbing 3-5 steps with a railing? : Total 6 Click Score: 8    End of Session   Activity Tolerance: Patient limited by fatigue Patient left: with call bell/phone within reach;in bed Nurse Communication: Mobility status PT Visit Diagnosis: Unsteadiness on feet (R26.81);Other abnormalities of gait and mobility (R26.89);Muscle weakness (generalized) (M62.81);Difficulty in walking, not elsewhere classified (R26.2);Pain;Adult, failure to thrive (R62.7)     Time: 7867-5449 PT Time Calculation (min) (ACUTE ONLY): 29 min  Charges:  $Therapeutic Activity: 8-22 mins                      Arby Barrette, PT Acute Rehabilitation Services  Office [phone]    Rexanne Mano 04/19/2022, 9:45 AM

## 2022-04-20 DIAGNOSIS — E440 Moderate protein-calorie malnutrition: Secondary | ICD-10-CM

## 2022-04-20 DIAGNOSIS — E876 Hypokalemia: Secondary | ICD-10-CM | POA: Diagnosis not present

## 2022-04-20 DIAGNOSIS — R195 Other fecal abnormalities: Secondary | ICD-10-CM | POA: Diagnosis not present

## 2022-04-20 DIAGNOSIS — D649 Anemia, unspecified: Secondary | ICD-10-CM | POA: Diagnosis not present

## 2022-04-20 LAB — BASIC METABOLIC PANEL
Anion gap: 4 — ABNORMAL LOW (ref 5–15)
BUN: 11 mg/dL (ref 8–23)
CO2: 21 mmol/L — ABNORMAL LOW (ref 22–32)
Calcium: 7.6 mg/dL — ABNORMAL LOW (ref 8.9–10.3)
Chloride: 120 mmol/L — ABNORMAL HIGH (ref 98–111)
Creatinine, Ser: 0.69 mg/dL (ref 0.44–1.00)
GFR, Estimated: >60 mL/min (ref >60)
Glucose, Bld: 139 mg/dL — ABNORMAL HIGH (ref 70–99)
Potassium: 3.6 mmol/L (ref 3.5–5.1)
Sodium: 145 mmol/L (ref 135–145)

## 2022-04-20 LAB — CBC
HCT: 23.9 % — ABNORMAL LOW (ref 36.0–46.0)
Hemoglobin: 7.3 g/dL — ABNORMAL LOW (ref 12.0–15.0)
MCH: 27.5 pg (ref 26.0–34.0)
MCHC: 30.5 g/dL (ref 30.0–36.0)
MCV: 90.2 fL (ref 80.0–100.0)
Platelets: 79 10*3/uL — ABNORMAL LOW (ref 150–400)
RBC: 2.65 MIL/uL — ABNORMAL LOW (ref 3.87–5.11)
RDW: 18.4 % — ABNORMAL HIGH (ref 11.5–15.5)
WBC: 9.8 10*3/uL (ref 4.0–10.5)
nRBC: 0.2 % (ref 0.0–0.2)

## 2022-04-20 MED ORDER — POTASSIUM CHLORIDE CRYS ER 20 MEQ PO TBCR
40.0000 meq | EXTENDED_RELEASE_TABLET | Freq: Once | ORAL | Status: AC
  Administered 2022-04-20: 40 meq via ORAL
  Filled 2022-04-20: qty 2

## 2022-04-20 NOTE — Progress Notes (Addendum)
[facility] Progress Note  Patient: Tracy Davidson     HER:[id number]  DOA: 04/12/2022   PCP: Billie Ruddy, MD       Brief hospital course: 81 year old female with right lung cancer status postradiation in 2022, hypertension, PAF, PAD external iliac stent who presented to the hospital with loss of appetite and generalized weakness.  She is now unable to ambulate. Regards to her lung cancer, the patient was diagnosed in 05/2021, underwent debulking and cryotherapy followed by radiation. F/u CT on 01/20/22 showed decrease in size of the cancer.  Hemoglobin noted to be 6.6 with heme positive stools. 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  GI was unable to do a colonoscopy due to the patient's frailty. She continues to have a very poor oral intake in regards to solid food.  She is being prescribed Ensure twice daily but is drinking only 40 to 50% of these.  She appears to have a poor understanding that she is not eating enough to survive long-term.  Subjective:  Having pain in the left lower back and right leg today.  Breakfast tray is in front of her.  Has not eaten much of her breakfast but states that she does not want anymore.  Assessment and Plan: Principal Problem: Loss of appetite with Malnutrition of moderate degree -Continue Ensure-will increase to 4 times daily - started on Zyprexa by palliative care for appetite - 7/5> double Marinol from 2.5 BID to 5 mg BID - cont Protonix daily - stopped IVF and increased Ensure to 4 times daily to see if she will drink fluids - she is eating 85% of breakfast and 75% of lunch today-continue to follow  Active Problems:   Symptomatic anemia, normocytic   Heme positive stool -See above discussion -Status post 1 unit of PRBC on 6/29 -Anemia panel revealed normal iron stores on 6/28 - Hemoglobin is ranging from 7-9-continue to  follow  Thrombocytopenia - Platelets have been steadily dropping - Hold Eliquis    Hypokalemia -Due to poor oral intake and HCTZ (on hold) - Continue to replace and continue to follow levels regularly  Stage IIb non-small cell cancer - History of right middle lobe central lung mass and right middle lobe and right lower lobe pulmonary nodules - Status post bronc with cryoablation and debulking and subsequent radiation  PAF -Currently in sinus rhythm  HTN -cont  Lisinopril - holding HCTZ   DVT prophylaxis:  Eliquis   Code Status: DNR  Consultants: palliative care Level of Care: Level of care: Telemetry Medical Disposition Plan:  Status is: Inpatient Remains inpatient appropriate because:   following oral intake- as it seems to be improved, today, will likely dc tomorrow  Objective:   Vitals:   04/19/22 1728 04/19/22 2104 04/20/22 0942 04/20/22 1638  BP: 140/87 (!) 136/98 (!) 148/82 (!) 142/80  Pulse: (!) 107 (!) 109 (!) 110 100  Resp: 16 16 18 20   Temp: 98 F (36.7 C) 98.6 F (37 C) 98.3 F (36.8 C) 98.2 F (36.8 C)  TempSrc:  Oral Oral Oral  SpO2: 100% 100% 100% 99%  Weight:      Height:       Filed Weights   04/12/22 1749 04/15/22 0533  Weight: 50.6 kg 54.4 kg   Exam: General exam: Appears comfortable  HEENT: PERRLA, oral mucosa moist, no sclera icterus or thrush Respiratory system: Clear to auscultation. Respiratory  effort normal. Cardiovascular system: S1 & S2 heard, regular rate and rhythm Gastrointestinal system: Abdomen soft, non-tender, nondistended. Normal bowel sounds   Central nervous system: Alert and oriented. No focal neurological deficits. Extremities: No cyanosis, clubbing or edema MSK: tender in R lower back, improves with massage Skin: No rashes or ulcers Psychiatry:  Mood & affect appropriate.    Imaging and lab data was personally reviewed    CBC: Recent Labs  Lab 04/14/22 0026 04/15/22 0411 04/17/22 0737 04/18/22 0151  WBC  13.9* 11.0* 14.1* 11.2*  HGB 9.1* 7.5* 8.9* 7.9*  HCT 29.5* 23.8* 29.6* 25.0*  MCV 86.8 84.4 88.6 86.5  PLT 107* 81* 68* 59*    Basic Metabolic Panel: Recent Labs  Lab 04/15/22 0411 04/16/22 0135 04/17/22 0737 04/18/22 0151 04/20/22 0253  NA 140 145 144 145 145  K 3.0* 2.7* 3.4* 3.0* 3.6  CL 108 110 111 111 120*  CO2 27 25 23 24  21*  GLUCOSE 115* 104* 118* 133* 139*  BUN 12 12 9 8 11   CREATININE 0.66 0.60 0.59 0.54 0.69  CALCIUM 9.1 9.0 8.1* 7.7* 7.6*  MG 1.3* 2.3 1.3* 2.0  --   PHOS  --  1.3* 2.0* 2.5  --     GFR: Estimated Creatinine Clearance: 46.7 mL/min (by C-G formula based on SCr of 0.69 mg/dL).  Scheduled Meds:  apixaban  2.5 mg Oral BID   atorvastatin  10 mg Oral Daily   dronabinol  5 mg Oral BID AC   feeding supplement  237 mL Oral QID   lisinopril  20 mg Oral Daily   multivitamin with minerals  1 tablet Oral Daily   OLANZapine zydis  2.5 mg Oral QHS   pantoprazole  40 mg Oral Daily   polyethylene glycol  17 g Oral Daily   Continuous Infusions:     LOS: 8 days   Author: Debbe Odea  04/20/2022 4:49 PM

## 2022-04-20 NOTE — Progress Notes (Signed)
Nutrition Follow-up  DOCUMENTATION CODES:   Non-severe (moderate) malnutrition in context of chronic illness  INTERVENTION:   Continue Ensure Enlive po QID, each supplement provides 350 kcal and 20 grams of protein.  Continue MVI with Minerals  NUTRITION DIAGNOSIS:   Moderate Malnutrition related to chronic illness as evidenced by moderate muscle depletion, moderate fat depletion.  Being addressed via supplements  GOAL:   Patient will meet greater than or equal to 90% of their needs  Progressing  MONITOR:   PO intake, Labs, Weight trends  REASON FOR ASSESSMENT:   Consult Assessment of nutrition requirement/status  ASSESSMENT:   81 yo female admitted with generalized weakness, severe electrolyte abnormalities with severe dehydration likely related to poor po intake.  PMH includes right-sided lung cancer s/p radiation therapy, HTN, PAF, PAD  Pt reports appetite is improving; lunch tray at bedside and pt ate at least 1/2 a sandwich if not more. Pt also reports to drinking Ensure supplements  Recorded po intake 85% at breakfast today, 75% of lunch which is a great improvement from previous po intake.   No recent wt loss  Labs: reviewed Meds: marinol, MVI with Minerals, Kcl, miralax  Diet Order:   Diet Order             DIET SOFT Room service appropriate? Yes; Fluid consistency: Thin  Diet effective now                   EDUCATION NEEDS:   Education needs have been addressed  Skin:  Skin Assessment: Skin Integrity Issues: Skin Integrity Issues:: Other (Comment) Other: skin tear on her rectum  Last BM:  7/5  Height:   Ht Readings from Last 1 Encounters:  04/13/22 5' 3.5" (1.613 m)    Weight:   Wt Readings from Last 1 Encounters:  04/15/22 54.4 kg    BMI:  Body mass index is 20.91 kg/m.  Estimated Nutritional Needs:   Kcal:  1500-1700 kcals  Protein:  70-80 g  Fluid:  >/= 1.5 L   Kerman Passey MS, RDN, LDN, CNSC Registered Dietitian  III Clinical Nutrition RD Pager and On-Call Pager Number Located in [place]

## 2022-04-21 DIAGNOSIS — R195 Other fecal abnormalities: Secondary | ICD-10-CM | POA: Diagnosis not present

## 2022-04-21 DIAGNOSIS — E440 Moderate protein-calorie malnutrition: Secondary | ICD-10-CM | POA: Diagnosis not present

## 2022-04-21 DIAGNOSIS — E876 Hypokalemia: Secondary | ICD-10-CM | POA: Diagnosis not present

## 2022-04-21 DIAGNOSIS — D649 Anemia, unspecified: Secondary | ICD-10-CM | POA: Diagnosis not present

## 2022-04-21 LAB — CBC
HCT: 22 % — ABNORMAL LOW (ref 36.0–46.0)
Hemoglobin: 6.9 g/dL — CL (ref 12.0–15.0)
MCH: 27.6 pg (ref 26.0–34.0)
MCHC: 31.4 g/dL (ref 30.0–36.0)
MCV: 88 fL (ref 80.0–100.0)
Platelets: 84 10*3/uL — ABNORMAL LOW (ref 150–400)
RBC: 2.5 MIL/uL — ABNORMAL LOW (ref 3.87–5.11)
RDW: 18.1 % — ABNORMAL HIGH (ref 11.5–15.5)
WBC: 10.2 10*3/uL (ref 4.0–10.5)
nRBC: 0.4 % — ABNORMAL HIGH (ref 0.0–0.2)

## 2022-04-21 LAB — BASIC METABOLIC PANEL
Anion gap: 9 (ref 5–15)
BUN: 15 mg/dL (ref 8–23)
CO2: 23 mmol/L (ref 22–32)
Calcium: 7.8 mg/dL — ABNORMAL LOW (ref 8.9–10.3)
Chloride: 114 mmol/L — ABNORMAL HIGH (ref 98–111)
Creatinine, Ser: 0.83 mg/dL (ref 0.44–1.00)
GFR, Estimated: >60 mL/min (ref >60)
Glucose, Bld: 142 mg/dL — ABNORMAL HIGH (ref 70–99)
Potassium: 3.2 mmol/L — ABNORMAL LOW (ref 3.5–5.1)
Sodium: 146 mmol/L — ABNORMAL HIGH (ref 135–145)

## 2022-04-21 LAB — CULTURE, BLOOD (ROUTINE X 2)
Culture: NO GROWTH
Culture: NO GROWTH
Special Requests: ADEQUATE
Special Requests: ADEQUATE

## 2022-04-21 LAB — HEMOGLOBIN AND HEMATOCRIT, BLOOD
HCT: 30.1 % — ABNORMAL LOW (ref 36.0–46.0)
Hemoglobin: 9.2 g/dL — ABNORMAL LOW (ref 12.0–15.0)

## 2022-04-21 LAB — PREPARE RBC (CROSSMATCH)

## 2022-04-21 MED ORDER — SODIUM CHLORIDE 0.9% IV SOLUTION
Freq: Once | INTRAVENOUS | Status: AC
Start: 2022-04-21 — End: 2022-04-21

## 2022-04-21 MED ORDER — FERROUS FUMARATE 324 (106 FE) MG PO TABS
1.0000 | ORAL_TABLET | Freq: Two times a day (BID) | ORAL | Status: DC
  Administered 2022-04-21 – 2022-04-22 (×3): 106 mg via ORAL
  Filled 2022-04-21 (×4): qty 1

## 2022-04-21 MED ORDER — POTASSIUM CHLORIDE CRYS ER 20 MEQ PO TBCR: 40.0000 meq | EXTENDED_RELEASE_TABLET | ORAL | Status: DC

## 2022-04-21 MED ORDER — POTASSIUM CHLORIDE 20 MEQ PO PACK
40.0000 meq | PACK | Freq: Once | ORAL | Status: AC
  Administered 2022-04-21: 40 meq via ORAL
  Filled 2022-04-21: qty 2

## 2022-04-21 NOTE — Progress Notes (Signed)
This chaplain is present for F/U spiritual care. The Pt. friend-[name] is at the bedside assisting the Pt. with lunch.   The chaplain observed the Pt. ate a few small bites of everything on her plate. The Pt. shares she plans to eat her ice cream and cake later. The chaplain requested assistance shifting the Pt. up in the bed. The chaplain observed and the Pt. confirmed her breathing became more labored with the move. The Pt. shares she is not concerned and continues to talk to the chaplain.  The chaplain understands in conversation with the Pt. and [name], the Pt. hopes to regain her strength to have the opportunity to travel and shop again locally with [name].    The Pt. accepts the chaplain's blessing and invitation for F/U spiritual care.  Chaplain Sallyanne Kuster [phone]

## 2022-04-21 NOTE — TOC Progression Note (Addendum)
Transition of Care [facility]) - Initial/Assessment Note    Patient Details  Name: Tracy Davidson MRN: [id number] Date of Birth: [birth year]  Transition of Care [facility]) CM/SW Contact:    Milinda Antis, [place] Phone Number: 04/21/2022, 10:29 AM  Clinical Narrative:                 CSW informed that the patient's oral intake has improved.   29-  CSW contacted Juliann Pulse with [facility] to verify that the facility can still accept the patient.  The facility is checking on bed availability and will inform CSW of the outcome.  CSW requested that TOC SWA begin insurance authorization.    14:45-  [facility] can accept the patient tomorrow if insurance Josem Kaufmann is received.  Insurance Josem Kaufmann is still pending.   16:09- insurance auth still pending  Expected Discharge Plan: [place] Barriers to Discharge: Continued Medical Work up, Ship broker, SNF Pending bed offer   Patient Goals and CMS Choice Patient states their goals for this hospitalization and ongoing recovery are:: Get into rehab and return home      Expected Discharge Plan and Services Expected Discharge Plan: [place]       Living arrangements for the past 2 months: Single Family Home                                      Prior Living Arrangements/Services Living arrangements for the past 2 months: Single Family Home Lives with:: Self Patient language and need for interpreter reviewed:: Yes        Need for Family Participation in Patient Care: Yes (Comment) Care giver support system in place?: No (comment)   Criminal Activity/Legal Involvement Pertinent to Current Situation/Hospitalization: No - Comment as needed  Activities of Daily Living Home Assistive Devices/Equipment: None ADL Screening (condition at time of admission) Patient's cognitive ability adequate to safely complete daily activities?: No Is the patient deaf or have difficulty hearing?: No Does the  patient have difficulty seeing, even when wearing glasses/contacts?: No Does the patient have difficulty concentrating, remembering, or making decisions?: No Patient able to express need for assistance with ADLs?: Yes Does the patient have difficulty dressing or bathing?: Yes Independently performs ADLs?: No Communication: Independent Dressing (OT): Needs assistance Is this a change from baseline?: Change from baseline, expected to last <3days Grooming: Needs assistance Is this a change from baseline?: Change from baseline, expected to last <3 days Bathing: Needs assistance Is this a change from baseline?: Change from baseline, expected to last <3 days Toileting: Needs assistance Is this a change from baseline?: Pre-admission baseline In/Out Bed: Needs assistance Is this a change from baseline?: Change from baseline, expected to last <3 days Walks in Home: Needs assistance Is this a change from baseline?: Pre-admission baseline Does the patient have difficulty walking or climbing stairs?: Yes Weakness of Legs: Both Weakness of Arms/Hands: Both  Permission Sought/Granted   Permission granted to share information with : Yes, Verbal Permission Granted  Share Information with NAME: CARTER,[name] [place])   [phone] (Mobile)           Emotional Assessment Appearance:: Appears stated age Attitude/Demeanor/Rapport: Lethargic Affect (typically observed): Accepting Orientation: : Oriented to Self, Oriented to Place, Oriented to  Time, Oriented to Situation Alcohol / Substance Use: Not Applicable Psych Involvement: No (comment)  Admission diagnosis:  Hypokalemia [E87.6] Hypomagnesemia [E83.42] Generalized weakness [R53.1] Gastrointestinal hemorrhage,  unspecified gastrointestinal hemorrhage type [K92.2] Patient Active Problem List   Diagnosis Date Noted   Malnutrition of moderate degree 04/15/2022   Gastrointestinal hemorrhage    Heme positive stool    Symptomatic anemia  04/12/2022   Hypokalemia 04/12/2022   Non-small cell lung cancer, 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/21/2021   Endobronchial cancer, right ([place])    Aortic atherosclerosis ([place]) 06/09/2021   Chronic deep vein thrombosis (DVT) of popliteal vein of left lower extremity ([place]) 06/09/2021   Right middle lobe pulmonary nodule 05/31/2021   Polycythemia 01/30/2020   Sinus tachycardia 01/30/2020   SOB (shortness of breath) on exertion 01/30/2020   Hypercalcemia 01/30/2020   PAD (peripheral artery disease) ([place]) 07/29/2019   Tobacco abuse 03/14/2012   LIVER FUNCTION TESTS, ABNORMAL, HX OF 09/21/2009   Dyslipidemia 09/20/2009   ALLERGIC RHINITIS 09/20/2009   Essential hypertension 06/06/2007   COLONIC POLYPS, HX OF 06/06/2007   PCP:  Billie Ruddy, MD Pharmacy:   [id number] [address], [place] [address] Phone: [phone] Fax: [phone]     Social Determinants of Health (SDOH) Interventions    Readmission Risk Interventions     No data to display

## 2022-04-21 NOTE — Progress Notes (Signed)
Overnight progress note  Hemoglobin 6.9 tonight, was 7.3 yesterday.  She is not having any bloody stools at this time.  -Type and screen.  1 unit PRBCs ordered after obtaining consent from the patient.  Follow-up posttransfusion H&H. -Also potassium is slightly low and being replaced. -Thrombocytopenia stable

## 2022-04-21 NOTE — Plan of Care (Signed)
  Problem: Education: Goal: Knowledge of General Education information will improve Description: Including pain rating scale, medication(s)/side effects and non-pharmacologic comfort measures Outcome: Progressing   Problem: Activity: Goal: Risk for activity intolerance will decrease Outcome: Progressing   Problem: Nutrition: Goal: Adequate nutrition will be maintained Outcome: Progressing   Problem: Coping: Goal: Level of anxiety will decrease Outcome: Progressing   Problem: Safety: Goal: Ability to remain free from injury will improve Outcome: Progressing   Problem: Pain Managment: Goal: General experience of comfort will improve Outcome: Progressing   Problem: Elimination: Goal: Will not experience complications related to bowel motility Outcome: Progressing Goal: Will not experience complications related to urinary retention Outcome: Progressing

## 2022-04-21 NOTE — Progress Notes (Signed)
  Mobility Specialist Criteria Algorithm Info.     04/21/22 1530  Mobility  Activity Refused mobility x2 (LE exercises on second attempt)  Range of Motion/Exercises Active;Passive  Level of Assistance Minimal assist, patient does 75% or more  Assistive Device None  Activity Response Tolerated well   Patient received in supine alseep but easily aroused on both attempts. Deferred OOB x2 for unspecified reasons. Agreeable to participate in LE bed exercises. Completed but with minimal participation, keeping eyes closed throughout session. Stated she was really tired today. Was left in supine with all needs met, call bell in reach.   04/21/2022 3:51 PM  [place], [place], [place]  DKCCQ:[phone] Office: [phone]

## 2022-04-21 NOTE — Progress Notes (Signed)
[facility] Progress Note  Patient: Tracy Davidson     YOV:[id number]  DOA: 04/12/2022   PCP: Billie Ruddy, MD       Brief hospital course: 81 year old female with right lung cancer status postradiation in 2022, hypertension, PAF, PAD external iliac stent who presented to the hospital with loss of appetite and generalized weakness.  She is now unable to ambulate. Regards to her lung cancer, the patient was diagnosed in 05/2021, underwent debulking and cryotherapy followed by radiation. F/u CT on 01/20/22 showed decrease in size of the cancer.  Hemoglobin noted to be 6.6 with heme positive stools. 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  GI was unable to do a colonoscopy due to the patient's frailty. She had to have a very poor oral intake in regards to solid food but with medications (Marinol and Zyprexa), this has improved  Subjective:  No complaints other than ongoing weakness. She is not using the bed pan and is having BMs in the bed.   Assessment and Plan: Principal Problem: Loss of appetite with Malnutrition of moderate degree Body mass index is 21.68 kg/m. -Continue Ensure-will increase to 4 times daily - started on Zyprexa by palliative care for appetite - 7/5> doubled Marinol from 2.5 BID to 5 mg BID - cont Protonix daily -  Ensure to 4 times daily - we are encouraging oral intake repeatedly - she is eating and drinking more now-   Active Problems:   Symptomatic anemia, normocytic   Heme positive stool -See above discussion -Status post 1 unit of PRBC on 6/29 -Anemia panel revealed normal iron stores on 6/28 - Hemoglobin is ranging from 7-9-today is 6.9- 1 UPRBC ordered by the night provider - post transfusion Hgb 9.2 - As GI is unable to do a colonoscopy and she has heme + stool, have stopped Eliquis indefinitely and have explained this to the patient  Thrombocytopenia -  Platelets dropped to a nadir of 59 on 7/4 and have increased since - Hold Eliquis    Hypokalemia -Due to poor oral intake and HCTZ (on hold) - Continue to replace as needed  Stage IIb non-small cell cancer - History of right middle lobe central lung mass and right middle lobe and right lower lobe pulmonary nodules - Status post bronc with cryoablation and debulking and subsequent radiation  PAF -Currently in sinus rhythm  HTN -cont  Lisinopril - holding HCTZ   DVT prophylaxis:  Eliquis   Code Status: DNR  Consultants: palliative care Level of Care: Level of care: Telemetry Medical Disposition Plan:  Status is: Inpatient Remains inpatient appropriate because:   following oral intake- as it seems to be improved, today, will likely dc once she has Josem Kaufmann- will go to SNF with palliative care  Objective:   Vitals:   04/21/22 0449 04/21/22 0521 04/21/22 0545 04/21/22 0745  BP: (!) 146/82 139/77 (!) 143/87 (!) 147/96  Pulse: (!) 106 (!) 102 (!) 106 (!) 107  Resp: 16 16 14 18   Temp: 98.7 F (37.1 C) 98.6 F (37 C) 98.7 F (37.1 C) 98.6 F (37 C)  TempSrc: Oral Oral Oral Oral  SpO2: 98% 99% 98% 98%  Weight:      Height:       Filed Weights   04/12/22 1749 04/15/22 0533 04/20/22 1815  Weight: 50.6 kg 54.4 kg 56.4 kg   Exam: General exam: Appears comfortable  HEENT: PERRLA, oral mucosa moist, no sclera icterus or thrush Respiratory system: Clear to auscultation. Respiratory effort normal. Cardiovascular system: S1 & S2 heard, regular rate and rhythm- sinus tach Gastrointestinal system: Abdomen soft, non-tender, nondistended. Normal bowel sounds   Central nervous system: Alert and oriented. No focal neurological deficits. Extremities: No cyanosis, clubbing or edema Skin: No rashes or ulcers Psychiatry:  Mood & affect appropriate.    Imaging and lab data was personally reviewed    CBC: Recent Labs  Lab 04/15/22 0411 04/17/22 0737 04/18/22 0151 04/20/22 0253  04/21/22 0030 04/21/22 0952  WBC 11.0* 14.1* 11.2* 9.8 10.2  --   HGB 7.5* 8.9* 7.9* 7.3* 6.9* 9.2*  HCT 23.8* 29.6* 25.0* 23.9* 22.0* 30.1*  MCV 84.4 88.6 86.5 90.2 88.0  --   PLT 81* 68* 59* 79* 84*  --     Basic Metabolic Panel: Recent Labs  Lab 04/15/22 0411 04/16/22 0135 04/17/22 0737 04/18/22 0151 04/20/22 0253 04/21/22 0030  NA 140 145 144 145 145 146*  K 3.0* 2.7* 3.4* 3.0* 3.6 3.2*  CL 108 110 111 111 120* 114*  CO2 27 25 23 24  21* 23  GLUCOSE 115* 104* 118* 133* 139* 142*  BUN 12 12 9 8 11 15   CREATININE 0.66 0.60 0.59 0.54 0.69 0.83  CALCIUM 9.1 9.0 8.1* 7.7* 7.6* 7.8*  MG 1.3* 2.3 1.3* 2.0  --   --   PHOS  --  1.3* 2.0* 2.5  --   --     GFR: Estimated Creatinine Clearance: 45 mL/min (by C-G formula based on SCr of 0.83 mg/dL).  Scheduled Meds:  atorvastatin  10 mg Oral Daily   dronabinol  5 mg Oral BID AC   feeding supplement  237 mL Oral QID   Ferrous Fumarate  1 tablet Oral BID   lisinopril  20 mg Oral Daily   multivitamin with minerals  1 tablet Oral Daily   OLANZapine zydis  2.5 mg Oral QHS   pantoprazole  40 mg Oral Daily   polyethylene glycol  17 g Oral Daily   Continuous Infusions:     LOS: 9 days   Author: Debbe Odea  04/21/2022 1:55 PM

## 2022-04-22 DIAGNOSIS — Z8619 Personal history of other infectious and parasitic diseases: Secondary | ICD-10-CM | POA: Diagnosis not present

## 2022-04-22 DIAGNOSIS — R638 Other symptoms and signs concerning food and fluid intake: Secondary | ICD-10-CM | POA: Diagnosis not present

## 2022-04-22 DIAGNOSIS — M160 Bilateral primary osteoarthritis of hip: Secondary | ICD-10-CM | POA: Diagnosis not present

## 2022-04-22 DIAGNOSIS — D696 Thrombocytopenia, unspecified: Secondary | ICD-10-CM | POA: Diagnosis not present

## 2022-04-22 DIAGNOSIS — C3491 Malignant neoplasm of unspecified part of right bronchus or lung: Secondary | ICD-10-CM | POA: Diagnosis not present

## 2022-04-22 DIAGNOSIS — I82532 Chronic embolism and thrombosis of left popliteal vein: Secondary | ICD-10-CM | POA: Diagnosis not present

## 2022-04-22 DIAGNOSIS — Z86010 Personal history of colonic polyps: Secondary | ICD-10-CM | POA: Diagnosis not present

## 2022-04-22 DIAGNOSIS — C342 Malignant neoplasm of middle lobe, bronchus or lung: Secondary | ICD-10-CM | POA: Diagnosis not present

## 2022-04-22 DIAGNOSIS — L22 Diaper dermatitis: Secondary | ICD-10-CM | POA: Diagnosis not present

## 2022-04-22 DIAGNOSIS — R531 Weakness: Secondary | ICD-10-CM | POA: Diagnosis not present

## 2022-04-22 DIAGNOSIS — J309 Allergic rhinitis, unspecified: Secondary | ICD-10-CM | POA: Diagnosis not present

## 2022-04-22 DIAGNOSIS — Z923 Personal history of irradiation: Secondary | ICD-10-CM | POA: Diagnosis not present

## 2022-04-22 DIAGNOSIS — Z7401 Bed confinement status: Secondary | ICD-10-CM | POA: Diagnosis not present

## 2022-04-22 DIAGNOSIS — M5136 Other intervertebral disc degeneration, lumbar region: Secondary | ICD-10-CM | POA: Diagnosis not present

## 2022-04-22 DIAGNOSIS — G4700 Insomnia, unspecified: Secondary | ICD-10-CM | POA: Diagnosis not present

## 2022-04-22 DIAGNOSIS — K922 Gastrointestinal hemorrhage, unspecified: Secondary | ICD-10-CM | POA: Diagnosis not present

## 2022-04-22 DIAGNOSIS — E876 Hypokalemia: Secondary | ICD-10-CM | POA: Diagnosis not present

## 2022-04-22 DIAGNOSIS — L853 Xerosis cutis: Secondary | ICD-10-CM | POA: Diagnosis not present

## 2022-04-22 DIAGNOSIS — K5900 Constipation, unspecified: Secondary | ICD-10-CM | POA: Diagnosis not present

## 2022-04-22 DIAGNOSIS — L89312 Pressure ulcer of right buttock, stage 2: Secondary | ICD-10-CM | POA: Diagnosis not present

## 2022-04-22 DIAGNOSIS — M6281 Muscle weakness (generalized): Secondary | ICD-10-CM | POA: Diagnosis not present

## 2022-04-22 DIAGNOSIS — R627 Adult failure to thrive: Secondary | ICD-10-CM | POA: Diagnosis not present

## 2022-04-22 DIAGNOSIS — E559 Vitamin D deficiency, unspecified: Secondary | ICD-10-CM | POA: Diagnosis not present

## 2022-04-22 DIAGNOSIS — Z515 Encounter for palliative care: Secondary | ICD-10-CM | POA: Diagnosis not present

## 2022-04-22 DIAGNOSIS — R195 Other fecal abnormalities: Secondary | ICD-10-CM | POA: Diagnosis not present

## 2022-04-22 DIAGNOSIS — T451X5A Adverse effect of antineoplastic and immunosuppressive drugs, initial encounter: Secondary | ICD-10-CM | POA: Diagnosis not present

## 2022-04-22 DIAGNOSIS — K219 Gastro-esophageal reflux disease without esophagitis: Secondary | ICD-10-CM | POA: Diagnosis not present

## 2022-04-22 DIAGNOSIS — Z87891 Personal history of nicotine dependence: Secondary | ICD-10-CM | POA: Diagnosis not present

## 2022-04-22 DIAGNOSIS — E7800 Pure hypercholesterolemia, unspecified: Secondary | ICD-10-CM | POA: Diagnosis not present

## 2022-04-22 DIAGNOSIS — E440 Moderate protein-calorie malnutrition: Secondary | ICD-10-CM | POA: Diagnosis not present

## 2022-04-22 DIAGNOSIS — Z79899 Other long term (current) drug therapy: Secondary | ICD-10-CM | POA: Diagnosis not present

## 2022-04-22 DIAGNOSIS — L89310 Pressure ulcer of right buttock, unstageable: Secondary | ICD-10-CM | POA: Diagnosis not present

## 2022-04-22 DIAGNOSIS — E860 Dehydration: Secondary | ICD-10-CM | POA: Diagnosis not present

## 2022-04-22 DIAGNOSIS — K264 Chronic or unspecified duodenal ulcer with hemorrhage: Secondary | ICD-10-CM | POA: Diagnosis not present

## 2022-04-22 DIAGNOSIS — E46 Unspecified protein-calorie malnutrition: Secondary | ICD-10-CM | POA: Diagnosis not present

## 2022-04-22 DIAGNOSIS — I480 Paroxysmal atrial fibrillation: Secondary | ICD-10-CM | POA: Diagnosis not present

## 2022-04-22 DIAGNOSIS — D649 Anemia, unspecified: Secondary | ICD-10-CM | POA: Diagnosis not present

## 2022-04-22 DIAGNOSIS — R112 Nausea with vomiting, unspecified: Secondary | ICD-10-CM | POA: Diagnosis not present

## 2022-04-22 DIAGNOSIS — C3490 Malignant neoplasm of unspecified part of unspecified bronchus or lung: Secondary | ICD-10-CM | POA: Diagnosis not present

## 2022-04-22 DIAGNOSIS — E43 Unspecified severe protein-calorie malnutrition: Secondary | ICD-10-CM | POA: Diagnosis not present

## 2022-04-22 DIAGNOSIS — I739 Peripheral vascular disease, unspecified: Secondary | ICD-10-CM | POA: Diagnosis not present

## 2022-04-22 DIAGNOSIS — I10 Essential (primary) hypertension: Secondary | ICD-10-CM | POA: Diagnosis not present

## 2022-04-22 DIAGNOSIS — I700 Atherosclerosis of aorta: Secondary | ICD-10-CM | POA: Diagnosis not present

## 2022-04-22 DIAGNOSIS — Z743 Need for continuous supervision: Secondary | ICD-10-CM | POA: Diagnosis not present

## 2022-04-22 DIAGNOSIS — R1312 Dysphagia, oropharyngeal phase: Secondary | ICD-10-CM | POA: Diagnosis not present

## 2022-04-22 DIAGNOSIS — H269 Unspecified cataract: Secondary | ICD-10-CM | POA: Diagnosis not present

## 2022-04-22 LAB — BASIC METABOLIC PANEL
Anion gap: 7 (ref 5–15)
Anion gap: 11 (ref 5–15)
BUN: 18 mg/dL (ref 8–23)
BUN: 17 mg/dL (ref 8–23)
CO2: 24 mmol/L (ref 22–32)
CO2: 22 mmol/L (ref 22–32)
Calcium: 8.3 mg/dL — ABNORMAL LOW (ref 8.9–10.3)
Calcium: 8.1 mg/dL — ABNORMAL LOW (ref 8.9–10.3)
Chloride: 116 mmol/L — ABNORMAL HIGH (ref 98–111)
Chloride: 112 mmol/L — ABNORMAL HIGH (ref 98–111)
Creatinine, Ser: 1.14 mg/dL — ABNORMAL HIGH (ref 0.44–1.00)
Creatinine, Ser: 0.97 mg/dL (ref 0.44–1.00)
GFR, Estimated: 48 mL/min — ABNORMAL LOW (ref >60)
GFR, Estimated: 59 mL/min — ABNORMAL LOW (ref >60)
Glucose, Bld: 123 mg/dL — ABNORMAL HIGH (ref 70–99)
Glucose, Bld: 137 mg/dL — ABNORMAL HIGH (ref 70–99)
Potassium: 2.4 mmol/L — CL (ref 3.5–5.1)
Potassium: 2.9 mmol/L — ABNORMAL LOW (ref 3.5–5.1)
Sodium: 147 mmol/L — ABNORMAL HIGH (ref 135–145)
Sodium: 145 mmol/L (ref 135–145)

## 2022-04-22 LAB — TYPE AND SCREEN
ABO/RH(D): A POS
Antibody Screen: NEGATIVE
Unit division: 0

## 2022-04-22 LAB — BPAM RBC
Blood Product Expiration Date: 202307252359
ISSUE DATE / TIME: 202307070516
Unit Type and Rh: 6200

## 2022-04-22 MED ORDER — OLANZAPINE 5 MG PO TBDP: 2.5000 mg | ORAL_TABLET | Freq: Every day | ORAL | Status: AC

## 2022-04-22 MED ORDER — DRONABINOL 5 MG PO CAPS: 5.0000 mg | ORAL_CAPSULE | Freq: Two times a day (BID) | ORAL | 0 refills | Status: AC

## 2022-04-22 MED ORDER — POLYETHYLENE GLYCOL 3350 17 G PO PACK: 17.0000 g | PACK | Freq: Every day | ORAL | 0 refills | Status: AC

## 2022-04-22 MED ORDER — LISINOPRIL 20 MG PO TABS: 20.0000 mg | ORAL_TABLET | Freq: Every day | ORAL | Status: AC

## 2022-04-22 MED ORDER — ENSURE ENLIVE PO LIQD
237.0000 mL | Freq: Four times a day (QID) | ORAL | 12 refills | Status: AC
Start: 2022-04-22 — End: ?

## 2022-04-22 MED ORDER — PREDNISONE 10 MG PO TABS: 10.0000 mg | ORAL_TABLET | Freq: Every day | ORAL | Status: DC

## 2022-04-22 MED ORDER — BISACODYL 10 MG RE SUPP: 10.0000 mg | Freq: Every day | RECTAL | 0 refills | Status: AC | PRN

## 2022-04-22 MED ORDER — FERROUS FUMARATE 324 (106 FE) MG PO TABS: 1.0000 | ORAL_TABLET | Freq: Two times a day (BID) | ORAL | Status: AC

## 2022-04-22 MED ORDER — POTASSIUM CHLORIDE CRYS ER 10 MEQ PO TBCR
40.0000 meq | EXTENDED_RELEASE_TABLET | ORAL | Status: AC
  Administered 2022-04-22 (×2): 40 meq via ORAL
  Filled 2022-04-22 (×2): qty 4

## 2022-04-22 MED ORDER — ADULT MULTIVITAMIN W/MINERALS CH: 1.0000 | ORAL_TABLET | Freq: Every day | ORAL | Status: AC

## 2022-04-22 MED ORDER — PANTOPRAZOLE SODIUM 40 MG PO TBEC: 40.0000 mg | DELAYED_RELEASE_TABLET | Freq: Every day | ORAL | Status: DC

## 2022-04-22 MED ORDER — POTASSIUM CHLORIDE CRYS ER 20 MEQ PO TBCR
40.0000 meq | EXTENDED_RELEASE_TABLET | Freq: Once | ORAL | Status: AC
Start: 2022-04-22 — End: 2022-04-22
  Administered 2022-04-22: 40 meq via ORAL
  Filled 2022-04-22: qty 2

## 2022-04-22 MED ORDER — ACETAMINOPHEN 325 MG PO TABS: 650.0000 mg | ORAL_TABLET | Freq: Four times a day (QID) | ORAL | Status: AC | PRN

## 2022-04-22 MED ORDER — ACETAMINOPHEN 650 MG RE SUPP: 650.0000 mg | RECTAL | 0 refills | Status: DC | PRN

## 2022-04-22 MED ORDER — POTASSIUM CHLORIDE CRYS ER 20 MEQ PO TBCR: 20.0000 meq | EXTENDED_RELEASE_TABLET | Freq: Every day | ORAL | Status: DC

## 2022-04-22 MED ORDER — POTASSIUM CHLORIDE 10 MEQ/100ML IV SOLN
10.0000 meq | INTRAVENOUS | Status: AC
  Administered 2022-04-22 (×3): 10 meq via INTRAVENOUS
  Filled 2022-04-22 (×6): qty 100

## 2022-04-22 NOTE — Progress Notes (Signed)
Report called and received by [facility] Nurse that is receiving the patient.

## 2022-04-22 NOTE — TOC Transition Note (Signed)
Transition of Care [facility]) - CM/SW Discharge Note   Patient Details  Name: Tracy Davidson MRN: [id number] Date of Birth: [birth year]  Transition of Care [facility]) CM/SW Contact:  Amador Cunas, [place] Phone Number: 04/22/2022, 1:07 PM   Clinical Narrative: Pt for dc to [facility] today. [place] auth received ([id number], valid 7/8-7/11). Juliann Pulse with [facility] confirmed they are prepared to admit pt to room 111. Packet complete and DC summary faxed to facility at [facility] request [phone]. RN provided with number for report. Pt aware of dc and reports agreeable. SW signing off at dc.   Wandra Feinstein, MSW, LCSW [phone] (coverage)        Final next level of care: Skilled Nursing Facility Barriers to Discharge: No Barriers Identified   Patient Goals and CMS Choice Patient states their goals for this hospitalization and ongoing recovery are:: Get into rehab and return home CMS Medicare.gov Compare Post Acute Care list provided to:: Patient Choice offered to / list presented to : Patient  Discharge Placement              Patient chooses bed at: [facility] Patient to be transferred to facility by: [facility] Name of family member notified: Pt updated her friend/[name] Patient and family notified of of transfer: 04/22/22  Discharge Plan and Services                                     Social Determinants of Health (SDOH) Interventions     Readmission Risk Interventions     No data to display

## 2022-04-22 NOTE — Progress Notes (Signed)
[facility] Liaison Note  Notified by [facility] of patient/family request of [facility] Paliative services.  [facility] hospital liaison will follow patient for discharge disposition.   Please call with any questions/concerns.    Thank you for the opportunity to participate in this patient's care.   Daphene Calamity, MSW [facility] Liaison  [phone]

## 2022-04-22 NOTE — Discharge Summary (Signed)
Physician Discharge Summary  Tracy Davidson RCV:[id number] DOB: 1941-02-15 DOA: 04/12/2022  PCP: Billie Ruddy, MD  Admit date: 04/12/2022 Discharge date: 04/22/2022 Discharging to: SNF Recommendations for Outpatient Follow-up:  Palliative care to follow at SNF DNR Check Bmet on Monday and f/u K+ intermittently Encourage oral intake of solids and liquids F/u with med onc and pulm in 1-2 wks  Consults:  Palliative care Procedures:  none  .hprob Discharge Diagnoses:   Principal Problem:   Hypokalemia Active Problems:   Decreased oral intake   Endobronchial cancer, right (HCC)   Symptomatic anemia   Heme positive stool   Gastrointestinal hemorrhage   Malnutrition of moderate degree     Hospital Course: 81 year old female with right lung cancer status postradiation in 2022, hypertension, PAF, DVT, PAD with external iliac stent who presented to the hospital with loss of appetite and generalized weakness.  She is now unable to ambulate. Regards to her lung cancer, the patient was diagnosed in 05/2021, underwent debulking and cryotherapy followed by radiation. F/u CT on 01/20/22 showed decrease in size of the cancer.  CXR on admission was unremarkable.  K noted to be 2.1 and Mg 1.6 on admission and replaced. Due to poor oral intake & heme positive stools in ED, GI was consulted. On 6/29, hemoglobin noted to be 6.6 - 1 U PRBC given. 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  GI was unable to do a colonoscopy due to the patient's frailty. She had to have a very poor oral intake in regards to solid food but with medications (Marinol and Zyprexa), this has improved but still needs to be monitored closely at SNF.    Loss of appetite with Malnutrition of moderate degree Body mass index is 21.68 kg/m. - evidenced by muscle and fat depletion - Started on Marinol  - started on Zyprexa  by palliative care for appetite and insomnia - 7/5> doubled Marinol from 2.5 BID to 5 mg BID -  Ensure to 4 times daily - we are encouraging oral intake repeatedly - she is eating and drinking more now-    Active Problems:   Symptomatic anemia, normocytic   Heme positive stool -See above discussion in hospital course - GI recommended chronic Iron therapy and blood transfusions as needed. Currently too frail for a colonoscopy. - As GI is unable to do a colonoscopy and she has heme + stool, I have stopped Eliquis indefinitely and have explained this to the patient -Anemia panel revealed normal iron stores on 6/28 - Hemoglobin has mostly been ranging from 7 to 9- Hgb noted to be 6.9 on 7/7- 1 U PRBC odered by night MD- there may have been lab error falsely reporting a low Hgb because post transfusion Hgb was 9.2    Thrombocytopenia - Platelets dropped to a nadir of 59 on 7/4 and have increased since - Holding Eliquis - recommend recheck in 1k wk     Hypokalemia -Due to poor oral intake and initially also due to HCTZ (on hold since 7/29) - Continue to replace daily and f/u Bmet at SNF-    Stage IIb non-small cell cancer - History of right middle lobe pedunculated lung mass and status post bronc with cryoablation and debulking - this was followed by radiation -  chest CT on 01/20/22 showed a significant interval reduction in size of a previously seen hypermetabolic mass of the  central right middle lobe, consistent with good treatment response. CT also showed an unchanged, likely benign, 0.6 cm subcentimeter nodule of the posterior lateral segment right middle lobe, abutting and tenting the major fissure. - has had no cough or dyspnea - oupt f/u with pulm, med onc and rad onc   PAF/ intermittent sinus tachycardia -Currently in sinus rhythm- has has some sinus tachycardia possibly from deconditioning - adding a b blocker would weaken her more   HTN -cont  Lisinopril - holding HCTZ    Body mass index is 21.68 kg/m. Nutrition Status: Nutrition Problem: Moderate Malnutrition Etiology: chronic illness Signs/Symptoms: moderate muscle depletion, moderate fat depletion Interventions: MVI, Ensure Enlive (each supplement provides 350kcal and 20 grams of protein)  Severe Deconditioning - as of today, she is still unable to do more than transition from bed to chair    Discharge Instructions  Discharge Instructions     Diet general   Complete by: As directed    Increase activity slowly   Complete by: As directed    No wound care   Complete by: As directed       Allergies as of 04/22/2022       Reactions   Vibra-tab [doxycycline] Palpitations        Medication List     STOP taking these medications    Eliquis 5 MG Tabs tablet Generic drug: apixaban   lisinopril-hydrochlorothiazide 20-25 MG tablet Commonly known as: ZESTORETIC   triamcinolone ointment 0.1 % Commonly known as: KENALOG       TAKE these medications    acetaminophen 325 MG tablet Commonly known as: TYLENOL Take 2 tablets (650 mg total) by mouth every 6 (six) hours as needed. What changed:  medication strength how much to take when to take this reasons to take this   atorvastatin 10 MG tablet Commonly known as: LIPITOR TAKE 1 TABLET BY MOUTH EVERY DAY   bisacodyl 10 MG suppository Commonly known as: DULCOLAX Place 1 suppository (10 mg total) rectally daily as needed for moderate constipation.   CALCIUM PO Take 1 tablet by mouth daily.   dronabinol 5 MG capsule Commonly known as: MARINOL Take 1 capsule (5 mg total) by mouth 2 (two) times daily before lunch and supper.   feeding supplement Liqd Take 237 mLs by mouth 4 (four) times daily.   Ferrous Fumarate 324 (106 Fe) MG Tabs tablet Commonly known as: HEMOCYTE - 106 mg FE Take 1 tablet (106 mg of iron total) by mouth 2 (two) times daily.   Klor-Con M20 20 MEQ tablet Generic drug: potassium chloride SA TAKE 1 TABLET  BY MOUTH EVERY DAY What changed:  how much to take how to take this   lisinopril 20 MG tablet Commonly known as: ZESTRIL Take 1 tablet (20 mg total) by mouth daily. Start taking on: April 23, 2022   MELATONIN PO Take 1 tablet by mouth daily.   multivitamin with minerals Tabs tablet Take 1 tablet by mouth daily. Start taking on: April 23, 2022   OLANZapine zydis 5 MG disintegrating tablet Commonly known as: ZYPREXA Take 0.5 tablets (2.5 mg total) by mouth at bedtime.   omeprazole 20 MG capsule Commonly known as: PRILOSEC TAKE 1 CAPSULE BY MOUTH EVERY DAY What changed: how much to take   polyethylene glycol 17 g packet Commonly known as: MIRALAX / GLYCOLAX Take 17 g by mouth daily. Start taking on: April 23, 2022   VITAMIN D-3 PO Take 1 capsule by mouth daily.  The results of significant diagnostics from this hospitalization (including imaging, microbiology, ancillary and laboratory) are listed below for reference.    CT HEAD WO CONTRAST (5MM)  Result Date: 04/12/2022 CLINICAL DATA:  Head trauma, minor (Age >= 65y); Neck trauma (Age >= 65y) EXAM: CT HEAD WITHOUT CONTRAST CT CERVICAL SPINE WITHOUT CONTRAST TECHNIQUE: Multidetector CT imaging of the head and cervical spine was performed following the standard protocol without intravenous contrast. Multiplanar CT image reconstructions of the cervical spine were also generated. RADIATION DOSE REDUCTION: This exam was performed according to the departmental dose-optimization program which includes automated exposure control, adjustment of the mA and/or kV according to patient size and/or use of iterative reconstruction technique. COMPARISON:  None Available. FINDINGS: CT HEAD FINDINGS Brain: No evidence of acute infarction, hemorrhage, hydrocephalus, extra-axial collection or mass lesion/mass effect. Patchy white matter hypoattenuation nonspecific but compatible with chronic microvascular ischemic disease. Vascular: Calcific  intracranial atherosclerosis. Skull: No acute fracture. Sinuses/Orbits: Clear sinuses.  No acute orbital findings. Other: No mastoid effusions. CT CERVICAL SPINE FINDINGS Alignment: No substantial sagittal subluxation. Skull base and vertebrae: Vertebral body heights are maintained. No evidence of acute fracture. Soft tissues and spinal canal: No prevertebral fluid or swelling. No visible canal hematoma. Disc levels: Multilevel facet and uncovertebral hypertrophy contributes to varying degrees of neural foraminal stenosis. Bridging osteophytes and endplate spurring at multiple levels. Upper chest: Visualized lung apices are clear. IMPRESSION: 1. No evidence of acute intracranial abnormality. 2. No evidence of acute fracture or traumatic malalignment in the cervical spine. Electronically Signed   By: Margaretha Sheffield M.D.   On: 04/12/2022 10:44   CT Cervical Spine Wo Contrast  Result Date: 04/12/2022 CLINICAL DATA:  Head trauma, minor (Age >= 65y); Neck trauma (Age >= 65y) EXAM: CT HEAD WITHOUT CONTRAST CT CERVICAL SPINE WITHOUT CONTRAST TECHNIQUE: Multidetector CT imaging of the head and cervical spine was performed following the standard protocol without intravenous contrast. Multiplanar CT image reconstructions of the cervical spine were also generated. RADIATION DOSE REDUCTION: This exam was performed according to the departmental dose-optimization program which includes automated exposure control, adjustment of the mA and/or kV according to patient size and/or use of iterative reconstruction technique. COMPARISON:  None Available. FINDINGS: CT HEAD FINDINGS Brain: No evidence of acute infarction, hemorrhage, hydrocephalus, extra-axial collection or mass lesion/mass effect. Patchy white matter hypoattenuation nonspecific but compatible with chronic microvascular ischemic disease. Vascular: Calcific intracranial atherosclerosis. Skull: No acute fracture. Sinuses/Orbits: Clear sinuses.  No acute orbital  findings. Other: No mastoid effusions. CT CERVICAL SPINE FINDINGS Alignment: No substantial sagittal subluxation. Skull base and vertebrae: Vertebral body heights are maintained. No evidence of acute fracture. Soft tissues and spinal canal: No prevertebral fluid or swelling. No visible canal hematoma. Disc levels: Multilevel facet and uncovertebral hypertrophy contributes to varying degrees of neural foraminal stenosis. Bridging osteophytes and endplate spurring at multiple levels. Upper chest: Visualized lung apices are clear. IMPRESSION: 1. No evidence of acute intracranial abnormality. 2. No evidence of acute fracture or traumatic malalignment in the cervical spine. Electronically Signed   By: Margaretha Sheffield M.D.   On: 04/12/2022 10:44   DG Pelvis Portable  Result Date: 04/12/2022 CLINICAL DATA:  Fall and weakness. EXAM: PORTABLE PELVIS 1-2 VIEWS COMPARISON:  Lumbar spine 03/01/2022 FINDINGS: Pelvic bony ring is intact. Degenerative changes in the lower lumbar spine. Again noted is a right iliac vascular stent. Large amount stool in the rectum. Degenerative changes along the lateral aspect of the acetabula bilaterally. No gross abnormality to either hip.  IMPRESSION: No acute bone abnormality in the pelvis. Electronically Signed   By: Markus Daft M.D.   On: 04/12/2022 10:28   DG Chest Port 1 View  Result Date: 04/12/2022 CLINICAL DATA:  Fall and weakness. EXAM: PORTABLE CHEST 1 VIEW COMPARISON:  Chest radiograph 11/24/2019 FINDINGS: Single view of the chest demonstrates clear lungs. Large bridging osteophytes in the thoracic spine. Heart and mediastinum are within normal limits. Negative for a pneumothorax. Trachea is midline. IMPRESSION: No active disease. Electronically Signed   By: Markus Daft M.D.   On: 04/12/2022 10:26   Labs:   Basic Metabolic Panel: Recent Labs  Lab 04/16/22 0135 04/17/22 0737 04/18/22 0151 04/20/22 0253 04/21/22 0030 04/22/22 0247  NA 145 144 145 145 146* 147*  K 2.7*  3.4* 3.0* 3.6 3.2* 2.4*  CL 110 111 111 120* 114* 116*  CO2 25 23 24  21* 23 24  GLUCOSE 104* 118* 133* 139* 142* 123*  BUN 12 9 8 11 15 18   CREATININE 0.60 0.59 0.54 0.69 0.83 1.14*  CALCIUM 9.0 8.1* 7.7* 7.6* 7.8* 8.3*  MG 2.3 1.3* 2.0  --   --   --   PHOS 1.3* 2.0* 2.5  --   --   --      CBC: Recent Labs  Lab 04/17/22 0737 04/18/22 0151 04/20/22 0253 04/21/22 0030 04/21/22 0952  WBC 14.1* 11.2* 9.8 10.2  --   HGB 8.9* 7.9* 7.3* 6.9* 9.2*  HCT 29.6* 25.0* 23.9* 22.0* 30.1*  MCV 88.6 86.5 90.2 88.0  --   PLT 68* 59* 79* 84*  --          SIGNED:   Debbe Odea, MD  [facility] 04/22/2022, 11:33 AM

## 2022-04-24 DIAGNOSIS — K5900 Constipation, unspecified: Secondary | ICD-10-CM | POA: Diagnosis not present

## 2022-04-24 DIAGNOSIS — K219 Gastro-esophageal reflux disease without esophagitis: Secondary | ICD-10-CM | POA: Diagnosis not present

## 2022-04-24 DIAGNOSIS — E876 Hypokalemia: Secondary | ICD-10-CM | POA: Diagnosis not present

## 2022-04-24 DIAGNOSIS — E46 Unspecified protein-calorie malnutrition: Secondary | ICD-10-CM | POA: Diagnosis not present

## 2022-04-24 DIAGNOSIS — D649 Anemia, unspecified: Secondary | ICD-10-CM | POA: Diagnosis not present

## 2022-04-24 DIAGNOSIS — I10 Essential (primary) hypertension: Secondary | ICD-10-CM | POA: Diagnosis not present

## 2022-04-24 DIAGNOSIS — G4700 Insomnia, unspecified: Secondary | ICD-10-CM | POA: Diagnosis not present

## 2022-04-24 DIAGNOSIS — E7800 Pure hypercholesterolemia, unspecified: Secondary | ICD-10-CM | POA: Diagnosis not present

## 2022-04-25 ENCOUNTER — Encounter (HOSPITAL_COMMUNITY): Payer: Self-pay

## 2022-04-25 DIAGNOSIS — D649 Anemia, unspecified: Secondary | ICD-10-CM | POA: Diagnosis not present

## 2022-04-25 DIAGNOSIS — L853 Xerosis cutis: Secondary | ICD-10-CM | POA: Diagnosis not present

## 2022-04-25 DIAGNOSIS — E46 Unspecified protein-calorie malnutrition: Secondary | ICD-10-CM | POA: Diagnosis not present

## 2022-04-25 DIAGNOSIS — L89312 Pressure ulcer of right buttock, stage 2: Secondary | ICD-10-CM | POA: Diagnosis not present

## 2022-04-26 DIAGNOSIS — E559 Vitamin D deficiency, unspecified: Secondary | ICD-10-CM | POA: Diagnosis not present

## 2022-04-26 DIAGNOSIS — E876 Hypokalemia: Secondary | ICD-10-CM | POA: Diagnosis not present

## 2022-04-26 DIAGNOSIS — D649 Anemia, unspecified: Secondary | ICD-10-CM | POA: Diagnosis not present

## 2022-04-26 DIAGNOSIS — E46 Unspecified protein-calorie malnutrition: Secondary | ICD-10-CM | POA: Diagnosis not present

## 2022-04-27 ENCOUNTER — Telehealth: Payer: Self-pay | Admitting: Internal Medicine

## 2022-04-27 DIAGNOSIS — E876 Hypokalemia: Secondary | ICD-10-CM | POA: Diagnosis not present

## 2022-04-27 DIAGNOSIS — I10 Essential (primary) hypertension: Secondary | ICD-10-CM | POA: Diagnosis not present

## 2022-04-27 DIAGNOSIS — K5900 Constipation, unspecified: Secondary | ICD-10-CM | POA: Diagnosis not present

## 2022-04-27 DIAGNOSIS — K219 Gastro-esophageal reflux disease without esophagitis: Secondary | ICD-10-CM | POA: Diagnosis not present

## 2022-04-27 DIAGNOSIS — G4700 Insomnia, unspecified: Secondary | ICD-10-CM | POA: Diagnosis not present

## 2022-04-27 DIAGNOSIS — E559 Vitamin D deficiency, unspecified: Secondary | ICD-10-CM | POA: Diagnosis not present

## 2022-04-27 DIAGNOSIS — T451X5A Adverse effect of antineoplastic and immunosuppressive drugs, initial encounter: Secondary | ICD-10-CM | POA: Diagnosis not present

## 2022-04-27 DIAGNOSIS — D649 Anemia, unspecified: Secondary | ICD-10-CM | POA: Diagnosis not present

## 2022-04-27 DIAGNOSIS — R112 Nausea with vomiting, unspecified: Secondary | ICD-10-CM | POA: Diagnosis not present

## 2022-04-27 DIAGNOSIS — E7800 Pure hypercholesterolemia, unspecified: Secondary | ICD-10-CM | POA: Diagnosis not present

## 2022-04-27 DIAGNOSIS — E46 Unspecified protein-calorie malnutrition: Secondary | ICD-10-CM | POA: Diagnosis not present

## 2022-04-27 NOTE — Telephone Encounter (Signed)
Tracy Davidson from [facility] called and said pt needed a f/u with Dr. Julien Nordmann. I scheduled appt and requested she send [place] over referral request. She said she will fax it to [place].

## 2022-04-28 ENCOUNTER — Other Ambulatory Visit: Payer: Self-pay | Admitting: Medical Oncology

## 2022-04-28 DIAGNOSIS — L853 Xerosis cutis: Secondary | ICD-10-CM | POA: Diagnosis not present

## 2022-04-28 DIAGNOSIS — E876 Hypokalemia: Secondary | ICD-10-CM | POA: Diagnosis not present

## 2022-04-28 DIAGNOSIS — C3491 Malignant neoplasm of unspecified part of right bronchus or lung: Secondary | ICD-10-CM

## 2022-04-28 DIAGNOSIS — D696 Thrombocytopenia, unspecified: Secondary | ICD-10-CM | POA: Diagnosis not present

## 2022-05-01 ENCOUNTER — Inpatient Hospital Stay: Payer: Medicare Other | Attending: Internal Medicine | Admitting: Internal Medicine

## 2022-05-01 ENCOUNTER — Encounter: Payer: Self-pay | Admitting: Internal Medicine

## 2022-05-01 ENCOUNTER — Inpatient Hospital Stay: Payer: Medicare Other

## 2022-05-01 ENCOUNTER — Other Ambulatory Visit: Payer: Self-pay

## 2022-05-01 VITALS — BP 122/72 | HR 102 | Temp 97.7°F

## 2022-05-01 DIAGNOSIS — C3491 Malignant neoplasm of unspecified part of right bronchus or lung: Secondary | ICD-10-CM

## 2022-05-01 DIAGNOSIS — L89312 Pressure ulcer of right buttock, stage 2: Secondary | ICD-10-CM | POA: Diagnosis not present

## 2022-05-01 DIAGNOSIS — C342 Malignant neoplasm of middle lobe, bronchus or lung: Secondary | ICD-10-CM | POA: Diagnosis not present

## 2022-05-01 DIAGNOSIS — Z79899 Other long term (current) drug therapy: Secondary | ICD-10-CM | POA: Insufficient documentation

## 2022-05-01 DIAGNOSIS — E46 Unspecified protein-calorie malnutrition: Secondary | ICD-10-CM | POA: Diagnosis not present

## 2022-05-01 DIAGNOSIS — D649 Anemia, unspecified: Secondary | ICD-10-CM | POA: Insufficient documentation

## 2022-05-01 DIAGNOSIS — Z923 Personal history of irradiation: Secondary | ICD-10-CM | POA: Insufficient documentation

## 2022-05-01 DIAGNOSIS — C3490 Malignant neoplasm of unspecified part of unspecified bronchus or lung: Secondary | ICD-10-CM

## 2022-05-01 DIAGNOSIS — L853 Xerosis cutis: Secondary | ICD-10-CM | POA: Diagnosis not present

## 2022-05-01 LAB — CBC WITH DIFFERENTIAL (CANCER CENTER ONLY)
Abs Immature Granulocytes: 0.52 10*3/uL — ABNORMAL HIGH (ref 0.00–0.07)
Basophils Absolute: 0 10*3/uL (ref 0.0–0.1)
Basophils Relative: 0 %
Eosinophils Absolute: 0 10*3/uL (ref 0.0–0.5)
Eosinophils Relative: 0 %
HCT: 32.7 % — ABNORMAL LOW (ref 36.0–46.0)
Hemoglobin: 10.1 g/dL — ABNORMAL LOW (ref 12.0–15.0)
Immature Granulocytes: 4 %
Lymphocytes Relative: 5 %
Lymphs Abs: 0.6 10*3/uL — ABNORMAL LOW (ref 0.7–4.0)
MCH: 26.4 pg (ref 26.0–34.0)
MCHC: 30.9 g/dL (ref 30.0–36.0)
MCV: 85.6 fL (ref 80.0–100.0)
Monocytes Absolute: 0.3 10*3/uL (ref 0.1–1.0)
Monocytes Relative: 2 %
Neutro Abs: 10.7 10*3/uL — ABNORMAL HIGH (ref 1.7–7.7)
Neutrophils Relative %: 89 %
Platelet Count: 109 10*3/uL — ABNORMAL LOW (ref 150–400)
RBC: 3.82 MIL/uL — ABNORMAL LOW (ref 3.87–5.11)
RDW: 19.9 % — ABNORMAL HIGH (ref 11.5–15.5)
WBC Count: 12.1 10*3/uL — ABNORMAL HIGH (ref 4.0–10.5)
nRBC: 0.5 % — ABNORMAL HIGH (ref 0.0–0.2)

## 2022-05-01 LAB — CMP (CANCER CENTER ONLY)
ALT: 19 U/L (ref 0–44)
AST: 45 U/L — ABNORMAL HIGH (ref 15–41)
Albumin: 3.2 g/dL — ABNORMAL LOW (ref 3.5–5.0)
Alkaline Phosphatase: 77 U/L (ref 38–126)
Anion gap: 7 (ref 5–15)
BUN: 11 mg/dL (ref 8–23)
CO2: 28 mmol/L (ref 22–32)
Calcium: 7.7 mg/dL — ABNORMAL LOW (ref 8.9–10.3)
Chloride: 110 mmol/L (ref 98–111)
Creatinine: 0.48 mg/dL (ref 0.44–1.00)
GFR, Estimated: >60 mL/min (ref >60)
Glucose, Bld: 150 mg/dL — ABNORMAL HIGH (ref 70–99)
Potassium: 3.7 mmol/L (ref 3.5–5.1)
Sodium: 145 mmol/L (ref 135–145)
Total Bilirubin: 1.1 mg/dL (ref 0.3–1.2)
Total Protein: 6.1 g/dL — ABNORMAL LOW (ref 6.5–8.1)

## 2022-05-01 LAB — IRON AND IRON BINDING CAPACITY (CC-WL,HP ONLY)
Iron: 69 ug/dL (ref 28–170)
Saturation Ratios: 31 % (ref 10.4–31.8)
TIBC: 225 ug/dL — ABNORMAL LOW (ref 250–450)
UIBC: 156 ug/dL (ref 148–442)

## 2022-05-01 LAB — SAMPLE TO BLOOD BANK

## 2022-05-01 NOTE — Progress Notes (Signed)
[facility] Telephone:([phone]) [phone]   Fax:([phone]) [phone]  OFFICE PROGRESS NOTE  Billie Ruddy, MD [place] [address]  DIAGNOSIS:  Stage IIB (T3, N0, M0) non-small cell lung cancer, favoring adenocarcinoma.  The patient presented with a right middle lobe central lung mass in addition to a suspicious right middle lobe and right lower lobe pulmonary nodules.  She was diagnosed in August 2022.   PRIOR THERAPY: Status post curative radiotherapy to the right middle and right lower lobe nodules under the care of Dr. Randa Ngo completed on September 16, 2021.    CURRENT THERAPY: Observation  INTERVAL HISTORY: Tracy Davidson 81 y.o. female returns to the clinic today for follow-up visit accompanied by her friend.  The patient is feeling fine today with no concerning complaints except for the baseline fatigue and shortness of breath.  She tolerated her previous curative radiotherapy under the care of Dr. Sondra Come fairly well.  She had repeat CT scan of the chest in April 2023 that showed improvement of her disease.  The patient was admitted to the hospital recently with hypokalemia as well as significant anemia.  She was found to have heme positive stool and she underwent evaluation by GI but no colonoscopy was done because of her frail condition.  She stopped her treatment with Eliquis.  She is here today for evaluation and recommendation regarding her condition.  She has no current chest pain but continues to have the shortness of breath with mild cough with no hemoptysis.  She has no nausea, vomiting, diarrhea or constipation.  She has no headache or visual changes.  MEDICAL HISTORY: Past Medical History:  Diagnosis Date   ALLERGIC RHINITIS 09/20/2009   Arthritis    LEGS and left arm   Bladder tumor    Cataract    both eyes, right worse thsn left   COLONIC POLYPS, HX OF 06/06/2007   DYSLIPIDEMIA 09/20/2009   Dyspnea    sob with activity   GERD  (gastroesophageal reflux disease)    History of radiation therapy    Right lung- 09/01/21-09/16/21- Dr. [name]   HYPERTENSION 06/06/2007   Knee pain    stiff and pain on side   LIVER FUNCTION TESTS, ABNORMAL, HX OF    feb or march 2019   Peripheral vascular disease ([place])    Shingles 11/2019   left arm and shoulder    ALLERGIES:  is allergic to vibra-tab [doxycycline].  MEDICATIONS:  Current Outpatient Medications  Medication Sig Dispense Refill   acetaminophen (TYLENOL) 325 MG tablet Take 2 tablets (650 mg total) by mouth every 6 (six) hours as needed.     atorvastatin (LIPITOR) 10 MG tablet TAKE 1 TABLET BY MOUTH EVERY DAY (Patient taking differently: Take 10 mg by mouth daily.) 90 tablet 3   bisacodyl (DULCOLAX) 10 MG suppository Place 1 suppository (10 mg total) rectally daily as needed for moderate constipation. 12 suppository 0   CALCIUM PO Take 1 tablet by mouth daily.     Cholecalciferol (VITAMIN D-3 PO) Take 1 capsule by mouth daily.     dronabinol (MARINOL) 5 MG capsule Take 1 capsule (5 mg total) by mouth 2 (two) times daily before lunch and supper. 10 capsule 0   feeding supplement (ENSURE ENLIVE / ENSURE PLUS) LIQD Take 237 mLs by mouth 4 (four) times daily. 237 mL 12   Ferrous Fumarate (HEMOCYTE - 106 MG FE) 324 (106 Fe) MG TABS tablet Take 1 tablet (  106 mg of iron total) by mouth 2 (two) times daily. 30 tablet    KLOR-CON M20 20 MEQ tablet TAKE 1 TABLET BY MOUTH EVERY DAY (Patient taking differently: 20 mEq daily.) 90 tablet 1   lisinopril (ZESTRIL) 20 MG tablet Take 1 tablet (20 mg total) by mouth daily.     MELATONIN PO Take 1 tablet by mouth daily.     Multiple Vitamin (MULTIVITAMIN WITH MINERALS) TABS tablet Take 1 tablet by mouth daily.     OLANZapine zydis (ZYPREXA) 5 MG disintegrating tablet Take 0.5 tablets (2.5 mg total) by mouth at bedtime.     omeprazole (PRILOSEC) 20 MG capsule TAKE 1 CAPSULE BY MOUTH EVERY DAY (Patient taking differently: Take 20 mg by  mouth daily.) 90 capsule 1   polyethylene glycol (MIRALAX / GLYCOLAX) 17 g packet Take 17 g by mouth daily. 14 each 0   No current facility-administered medications for this visit.    SURGICAL HISTORY:  Past Surgical History:  Procedure Laterality Date   ABDOMINAL AORTOGRAM W/LOWER EXTREMITY Bilateral 03/03/2020   Procedure: ABDOMINAL AORTOGRAM W/LOWER EXTREMITY;  Surgeon: Marty Heck, MD;  Location: [facility];  Service: Cardiovascular;  Laterality: Bilateral;   BREAST BIOPSY Left 02/02/2021   BREAST EXCISIONAL BIOPSY Left 1975   BRONCHIAL BIOPSY  06/14/2021   Procedure: BRONCHIAL BIOPSIES;  Surgeon: Garner Nash, DO;  Location: [facility];  Service: Pulmonary;;   BRONCHIAL BRUSHINGS  06/14/2021   Procedure: BRONCHIAL BRUSHINGS;  Surgeon: Garner Nash, DO;  Location: [facility];  Service: Pulmonary;;   BRONCHIAL NEEDLE ASPIRATION BIOPSY  06/14/2021   Procedure: BRONCHIAL NEEDLE ASPIRATION BIOPSIES;  Surgeon: Garner Nash, DO;  Location: [facility];  Service: Pulmonary;;   COLONOSCOPY  ?2015   CRYOTHERAPY  06/14/2021   Procedure: CRYOTHERAPY;  Surgeon: Garner Nash, DO;  Location: [facility];  Service: Pulmonary;;   CYSTOSCOPY WITH BIOPSY N/A 04/12/2020   Procedure: CYSTOSCOPY WITH BIOPSY  INSTILLATION OF GEMCITABINE;  Surgeon: Lucas Mallow, MD;  Location: [facility];  Service: Urology;  Laterality: N/A;   DILATION AND CURETTAGE OF UTERUS  yrs ago   ESOPHAGOGASTRODUODENOSCOPY (EGD) WITH PROPOFOL N/A 04/13/2022   Procedure: ESOPHAGOGASTRODUODENOSCOPY (EGD) WITH PROPOFOL;  Surgeon: Irene Shipper, MD;  Location: [facility];  Service: Gastroenterology;  Laterality: N/A;   HEMOSTASIS CONTROL  06/14/2021   Procedure: HEMOSTASIS CONTROL;  Surgeon: Garner Nash, DO;  Location: [facility];  Service: Pulmonary;;   PERIPHERAL VASCULAR INTERVENTION Right 03/03/2020   Procedure: PERIPHERAL VASCULAR INTERVENTION;  Surgeon: Marty Heck, MD;  Location: [facility];  Service: Cardiovascular;  Laterality: Right;   RADIOACTIVE SEED GUIDED EXCISIONAL BREAST BIOPSY Left 02/02/2021   Procedure: RADIOACTIVE SEED GUIDED EXCISIONAL LEFT BREAST BIOPSY;  Surgeon: Stark Klein, MD;  Location: [facility];  Service: General;  Laterality: Left;   [place]  yrs ago   complete   TRANSURETHRAL RESECTION OF BLADDER TUMOR N/A 09/18/2018   Procedure: RESTAGE TRANSURETHRAL RESECTION OF BLADDER TUMOR (TURBT);  Surgeon: Lucas Mallow, MD;  Location[facility];  Service: Urology;  Laterality: N/A;   TRANSURETHRAL RESECTION OF BLADDER TUMOR WITH MITOMYCIN-C N/A 08/16/2018   Procedure: TRANSURETHRAL RESECTION OF BLADDER TUMOR;  Surgeon: Lucas Mallow, MD;  Location[facility];  Service: Urology;  Laterality: N/A;   VIDEO BRONCHOSCOPY WITH ENDOBRONCHIAL ULTRASOUND N/A 06/14/2021   Procedure: VIDEO BRONCHOSCOPY WITH ENDOBRONCHIAL  ULTRASOUND;  Surgeon: Garner Nash, DO;  Location: [facility];  Service: Pulmonary;  Laterality: N/A;    REVIEW OF SYSTEMS:  Constitutional: positive for fatigue Eyes: negative Ears, nose, mouth, throat, and face: negative Respiratory: positive for cough and dyspnea on exertion Cardiovascular: negative Gastrointestinal: negative Genitourinary:negative Integument/breast: negative Hematologic/lymphatic: negative Musculoskeletal:negative Neurological: negative Behavioral/Psych: negative Endocrine: negative Allergic/Immunologic: negative   PHYSICAL EXAMINATION: General appearance: alert, cooperative, fatigued, and no distress Head: Normocephalic, without obvious abnormality, atraumatic Neck: no adenopathy, no JVD, supple, symmetrical, trachea midline, and thyroid not enlarged, symmetric, no tenderness/mass/nodules Lymph nodes: Cervical, supraclavicular, and axillary nodes normal. Resp: clear to  auscultation bilaterally Back: symmetric, no curvature. ROM normal. No CVA tenderness. Cardio: regular rate and rhythm, S1, S2 normal, no murmur, click, rub or gallop GI: soft, non-tender; bowel sounds normal; no masses,  no organomegaly Extremities: extremities normal, atraumatic, no cyanosis or edema Neurologic: Alert and oriented X 3, normal strength and tone. Normal symmetric reflexes. Normal coordination and gait  ECOG PERFORMANCE STATUS: 1 - Symptomatic but completely ambulatory  Blood pressure 122/72, pulse (!) 102, temperature 97.7 F (36.5 C), temperature source Oral, SpO2 99 %.  LABORATORY DATA: Lab Results  Component Value Date   WBC 10.2 04/21/2022   HGB 9.2 (L) 04/21/2022   HCT 30.1 (L) 04/21/2022   MCV 88.0 04/21/2022   PLT 84 (L) 04/21/2022      Chemistry      Component Value Date/Time   NA 145 04/22/2022 1128   K 2.9 (L) 04/22/2022 1128   CL 112 (H) 04/22/2022 1128   CO2 22 04/22/2022 1128   BUN 17 04/22/2022 1128   CREATININE 0.97 04/22/2022 1128   CREATININE 0.78 07/21/2021 1018      Component Value Date/Time   CALCIUM 8.1 (L) 04/22/2022 1128   ALKPHOS 54 04/18/2022 0151   AST 43 (H) 04/18/2022 0151   AST 23 07/21/2021 1018   ALT 21 04/18/2022 0151   ALT 16 07/21/2021 1018   BILITOT 0.9 04/18/2022 0151   BILITOT 1.1 07/21/2021 1018       RADIOGRAPHIC STUDIES: CT HEAD WO CONTRAST (5MM)  Result Date: 04/12/2022 CLINICAL DATA:  Head trauma, minor (Age >= 65y); Neck trauma (Age >= 65y) EXAM: CT HEAD WITHOUT CONTRAST CT CERVICAL SPINE WITHOUT CONTRAST TECHNIQUE: Multidetector CT imaging of the head and cervical spine was performed following the standard protocol without intravenous contrast. Multiplanar CT image reconstructions of the cervical spine were also generated. RADIATION DOSE REDUCTION: This exam was performed according to the departmental dose-optimization program which includes automated exposure control, adjustment of the mA and/or kV  according to patient size and/or use of iterative reconstruction technique. COMPARISON:  None Available. FINDINGS: CT HEAD FINDINGS Brain: No evidence of acute infarction, hemorrhage, hydrocephalus, extra-axial collection or mass lesion/mass effect. Patchy white matter hypoattenuation nonspecific but compatible with chronic microvascular ischemic disease. Vascular: Calcific intracranial atherosclerosis. Skull: No acute fracture. Sinuses/Orbits: Clear sinuses.  No acute orbital findings. Other: No mastoid effusions. CT CERVICAL SPINE FINDINGS Alignment: No substantial sagittal subluxation. Skull base and vertebrae: Vertebral body heights are maintained. No evidence of acute fracture. Soft tissues and spinal canal: No prevertebral fluid or swelling. No visible canal hematoma. Disc levels: Multilevel facet and uncovertebral hypertrophy contributes to varying degrees of neural foraminal stenosis. Bridging osteophytes and endplate spurring at multiple levels. Upper chest: Visualized lung apices are clear. IMPRESSION: 1. No evidence of acute intracranial abnormality. 2. No evidence of acute fracture or traumatic malalignment in the cervical spine. Electronically  Signed   By: Margaretha Sheffield M.D.   On: 04/12/2022 10:44   CT Cervical Spine Wo Contrast  Result Date: 04/12/2022 CLINICAL DATA:  Head trauma, minor (Age >= 65y); Neck trauma (Age >= 65y) EXAM: CT HEAD WITHOUT CONTRAST CT CERVICAL SPINE WITHOUT CONTRAST TECHNIQUE: Multidetector CT imaging of the head and cervical spine was performed following the standard protocol without intravenous contrast. Multiplanar CT image reconstructions of the cervical spine were also generated. RADIATION DOSE REDUCTION: This exam was performed according to the departmental dose-optimization program which includes automated exposure control, adjustment of the mA and/or kV according to patient size and/or use of iterative reconstruction technique. COMPARISON:  None Available.  FINDINGS: CT HEAD FINDINGS Brain: No evidence of acute infarction, hemorrhage, hydrocephalus, extra-axial collection or mass lesion/mass effect. Patchy white matter hypoattenuation nonspecific but compatible with chronic microvascular ischemic disease. Vascular: Calcific intracranial atherosclerosis. Skull: No acute fracture. Sinuses/Orbits: Clear sinuses.  No acute orbital findings. Other: No mastoid effusions. CT CERVICAL SPINE FINDINGS Alignment: No substantial sagittal subluxation. Skull base and vertebrae: Vertebral body heights are maintained. No evidence of acute fracture. Soft tissues and spinal canal: No prevertebral fluid or swelling. No visible canal hematoma. Disc levels: Multilevel facet and uncovertebral hypertrophy contributes to varying degrees of neural foraminal stenosis. Bridging osteophytes and endplate spurring at multiple levels. Upper chest: Visualized lung apices are clear. IMPRESSION: 1. No evidence of acute intracranial abnormality. 2. No evidence of acute fracture or traumatic malalignment in the cervical spine. Electronically Signed   By: Margaretha Sheffield M.D.   On: 04/12/2022 10:44   DG Pelvis Portable  Result Date: 04/12/2022 CLINICAL DATA:  Fall and weakness. EXAM: PORTABLE PELVIS 1-2 VIEWS COMPARISON:  Lumbar spine 03/01/2022 FINDINGS: Pelvic bony ring is intact. Degenerative changes in the lower lumbar spine. Again noted is a right iliac vascular stent. Large amount stool in the rectum. Degenerative changes along the lateral aspect of the acetabula bilaterally. No gross abnormality to either hip. IMPRESSION: No acute bone abnormality in the pelvis. Electronically Signed   By: Markus Daft M.D.   On: 04/12/2022 10:28   DG Chest Port 1 View  Result Date: 04/12/2022 CLINICAL DATA:  Fall and weakness. EXAM: PORTABLE CHEST 1 VIEW COMPARISON:  Chest radiograph 11/24/2019 FINDINGS: Single view of the chest demonstrates clear lungs. Large bridging osteophytes in the thoracic spine.  Heart and mediastinum are within normal limits. Negative for a pneumothorax. Trachea is midline. IMPRESSION: No active disease. Electronically Signed   By: Markus Daft M.D.   On: 04/12/2022 10:26    ASSESSMENT AND PLAN: This is a very pleasant 81 years old African-American female with stage IIb (T3, N0, M0) non-small cell lung cancer, favoring adenocarcinoma presented with right middle lobe central lung mass in addition to suspicious right middle and right lower lobe pulmonary nodules diagnosed in August 2022. The patient was not a good surgical candidate and she received curative radiotherapy under the care of Dr. Sondra Come completed September 16, 2021. Her last CT scan of the chest in April 2023 showed improvement of her disease. I recommended for the patient to continue on observation with repeat CT scan of the chest in 3 months for restaging of her disease. Regarding the anemia, I will send the patient to the lab today for repeat CBC, iron study and ferritin.  We will consider The patient for iron infusion if she has significant iron deficiency. The patient was advised to call immediately if she has any other concerning symptoms in the interval.  The patient voices understanding of current disease status and treatment options and is in agreement with the current care plan.  All questions were answered. The patient knows to call the clinic with any problems, questions or concerns. We can certainly see the patient much sooner if necessary.  The total time spent in the appointment was 35 minutes.  Disclaimer: This note was dictated with voice recognition software. Similar sounding words can inadvertently be transcribed and may not be corrected upon review.

## 2022-05-02 ENCOUNTER — Non-Acute Institutional Stay: Payer: Self-pay | Admitting: Hospice

## 2022-05-02 DIAGNOSIS — Z515 Encounter for palliative care: Secondary | ICD-10-CM | POA: Diagnosis not present

## 2022-05-02 DIAGNOSIS — C3491 Malignant neoplasm of unspecified part of right bronchus or lung: Secondary | ICD-10-CM | POA: Diagnosis not present

## 2022-05-02 DIAGNOSIS — R531 Weakness: Secondary | ICD-10-CM

## 2022-05-02 DIAGNOSIS — E876 Hypokalemia: Secondary | ICD-10-CM

## 2022-05-02 DIAGNOSIS — E440 Moderate protein-calorie malnutrition: Secondary | ICD-10-CM | POA: Diagnosis not present

## 2022-05-02 DIAGNOSIS — R630 Anorexia: Secondary | ICD-10-CM

## 2022-05-02 LAB — FERRITIN: Ferritin: 210 ng/mL (ref 11–307)

## 2022-05-02 NOTE — Progress Notes (Signed)
[facility] Consult Note Telephone: [phone]  Fax: [phone]  PATIENT NAME: Tracy Davidson [place] [address] [phone] (home)  DOB: 06/21/1941 MRN: [id number]  PRIMARY CARE PROVIDER:    Billie Ruddy, MD,  [place] [address] [phone]  REFERRING PROVIDER:   Billie Ruddy, MD [address] [place],  [place] [address] [phone]  RESPONSIBLE PARTY:   Self Contact Information     Name Relation Home Work Mobile   [place]   [phone]        I met face to face with patient at facility. Visit to build trust and highlight Palliative Medicine as specialized medical care for people living with serious illness, aimed at facilitating better quality of life through symptoms relief, assisting with advance care planning and complex medical decision making. [name] visited patient during visit.  ASSESSMENT AND / RECOMMENDATIONS:   Advance Care Planning: Our advance care planning conversation included a discussion about:    The value and importance of advance care planning  Difference between Hospice and Palliative care Exploration of goals of care in the event of a sudden injury or illness  Identification and preparation of a healthcare agent  Review and updating or creation of an  advance directive document . Decision not to resuscitate or to de-escalate disease focused treatments due to poor prognosis.  CODE STATUS: Discussion on code status. Patient affirmed she is DNR  Goals of Care: Goals include to maximize quality of life and symptom management  I spent 25  minutes providing this initial consultation. More than 50% of the time in this consultation was spent on counseling patient and coordinating  communication. --------------------------------------------------------------------------------------------------------------------------------------  Symptom Management/Plan: Hypokalemia: Hospitalized 6/28- 04/22/22 for hypokalemia related to poor oral intake and HCTZ. Hold HTCZ since K noted to be 2.1 and Mg 1.6 on admission and replaced.  Current potassium level is 3.0 04/27/2022.  Continue potassium replacement as ordered.  Recheck CBC BMP in a week. Marland Kitchen Poor appetite: Continue Marinol.  Offer assistance during meals to ensure adequate oral intake. Protein caloric malnutrition: Evident muscle and fat depletion.  Albumin 3.2 05/01/2022.  Continue Marinol and mirtazapine to help boost appetite.  Ensure twice daily.  Monitor weight.  Routine CBC CMP. Weakness: PT OT is ongoing for strengthening and gait training. Right Lung CA: post radiation in 2022.  Continue follow-up appointments with oncology. Follow up: Palliative care will continue to follow for complex medical decision making, advance care planning, and clarification of goals. Return 6 weeks or prn. Encouraged to call provider sooner with any concerns.   Family /Caregiver/Community Supports:   HOSPICE ELIGIBILITY/DIAGNOSIS: TBD  Chief Complaint: Initial Palliative care visit  HISTORY OF PRESENT ILLNESS:  ALLIANNA BEAUBIEN is a 81 y.o. year old female  with multiple morbidities requiring close monitoring and with high risk of complications and  mortality:   Right lung cancer status post radiation 2022, hypokalemia,  protein caloric malnutrition, weakness.  History obtained from review of EMR, discussion with primary team, caregiver, family and/or Ms. Esson.  Review and summarization of Epic records shows history from other than patient. Rest of 10 point ROS asked and negative. Independent interpretation of tests and reviewed as needed, available labs, patient records, imaging, studies and related documents from the EMR.  Recent Labs  Lab  05/01/22 1557  WBC 12.1*  HGB 10.1*  HCT 32.7*  PLT 109*  MCV 85.6   Recent Labs  Lab 05/01/22 1557  NA 145  K 3.7  CL 110  CO2 28  BUN 11  CREATININE 0.48  GLUCOSE 150*   Latest GFR by Cockcroft Gault (not valid in AKI or ESRD) Estimated Creatinine Clearance: 46.7 mL/min (by C-G formula based on SCr of 0.48 mg/dL). Recent Labs  Lab 05/01/22 1557  AST 45*  ALT 19  ALKPHOS 77  x   PAST MEDICAL HISTORY:  Active Ambulatory Problems    Diagnosis Date Noted   Dyslipidemia 09/20/2009   Essential hypertension 06/06/2007   ALLERGIC RHINITIS 09/20/2009   LIVER FUNCTION TESTS, ABNORMAL, HX OF 09/21/2009   COLONIC POLYPS, HX OF 06/06/2007   Tobacco abuse 03/14/2012   PAD (peripheral artery disease) ([facility]) 07/29/2019   Polycythemia 01/30/2020   Sinus tachycardia 01/30/2020   SOB (shortness of breath) on exertion 01/30/2020   Hypercalcemia 01/30/2020   Right middle lobe pulmonary nodule 05/31/2021   Aortic atherosclerosis ([facility]) 06/09/2021   Chronic deep vein thrombosis (DVT) of popliteal vein of left lower extremity ([facility] [facility]) 06/09/2021   Endobronchial cancer, right ([facility])    Non-small cell lung cancer, right ([facility]) 06/21/2021   Symptomatic anemia 04/12/2022   Hypokalemia 04/12/2022   Heme positive stool    Gastrointestinal hemorrhage    Malnutrition of moderate degree 04/15/2022   Decreased oral intake 04/22/2022   Resolved Ambulatory Problems    Diagnosis Date Noted   Malignant neoplasm of unspecified part of unspecified bronchus or lung ([facility]) 05/31/2021   Past Medical History:  Diagnosis Date   Arthritis    Bladder tumor    Cataract    DYSLIPIDEMIA 09/20/2009   Dyspnea    GERD (gastroesophageal reflux disease)    History of radiation therapy    HYPERTENSION 06/06/2007   Knee pain    Peripheral vascular disease ([place])    Shingles 11/2019    SOCIAL HX:  Social History   Tobacco Use   Smoking status: Former    Packs/day: 1.00    Years: 50.00    Total pack  years: 50.00    Types: Cigarettes    Start date: 1966   Smokeless tobacco: Never   Tobacco comments:    Stopped smoking 07/10/2021  Substance Use Topics   Alcohol use: Not Currently    Alcohol/week: 2.0 standard drinks of alcohol    Types: 2 Shots of liquor per week    Comment: daily alcohol     FAMILY HX:  Family History  Problem Relation Age of Onset   Colon cancer Neg Hx    Colon polyps Neg Hx    Esophageal cancer Neg Hx    Rectal cancer Neg Hx    Stomach cancer Neg Hx    Pancreatic cancer Neg Hx    Breast cancer Neg Hx       ALLERGIES:  Allergies  Allergen Reactions   Vibra-Tab [Doxycycline] Palpitations      PERTINENT MEDICATIONS:  Outpatient Encounter Medications as of 05/02/2022  Medication Sig   acetaminophen (TYLENOL) 325 MG tablet Take 2 tablets (650 mg total) by mouth every 6 (six) hours as needed.   atorvastatin (LIPITOR) 10 MG tablet TAKE 1 TABLET BY MOUTH EVERY DAY (Patient taking differently: Take 10 mg by mouth daily.)   bisacodyl (DULCOLAX) 10 MG suppository Place 1 suppository (10 mg total) rectally daily as needed for moderate constipation.   CALCIUM PO Take 1 tablet by mouth daily.   Cholecalciferol (VITAMIN D-3 PO) Take 1 capsule by mouth daily.   dronabinol (MARINOL) 5 MG  capsule Take 1 capsule (5 mg total) by mouth 2 (two) times daily before lunch and supper.   feeding supplement (ENSURE ENLIVE / ENSURE PLUS) LIQD Take 237 mLs by mouth 4 (four) times daily.   Ferrous Fumarate (HEMOCYTE - 106 MG FE) 324 (106 Fe) MG TABS tablet Take 1 tablet (106 mg of iron total) by mouth 2 (two) times daily.   KLOR-CON M20 20 MEQ tablet TAKE 1 TABLET BY MOUTH EVERY DAY (Patient taking differently: 20 mEq daily.)   lisinopril (ZESTRIL) 20 MG tablet Take 1 tablet (20 mg total) by mouth daily.   MELATONIN PO Take 1 tablet by mouth daily.   Multiple Vitamin (MULTIVITAMIN WITH MINERALS) TABS tablet Take 1 tablet by mouth daily.   OLANZapine zydis (ZYPREXA) 5 MG  disintegrating tablet Take 0.5 tablets (2.5 mg total) by mouth at bedtime.   omeprazole (PRILOSEC) 20 MG capsule TAKE 1 CAPSULE BY MOUTH EVERY DAY (Patient taking differently: Take 20 mg by mouth daily.)   polyethylene glycol (MIRALAX / GLYCOLAX) 17 g packet Take 17 g by mouth daily.   No facility-administered encounter medications on file as of 05/02/2022.     Thank you for the opportunity to participate in the care of Ms. Bonawitz.  The palliative care team will continue to follow. Please call our office at [phone] if we can be of additional assistance.   Note: Portions of this note were generated with [employer]. Dictation errors may occur despite best attempts at proofreading.  Teodoro Spray, NP

## 2022-05-03 ENCOUNTER — Non-Acute Institutional Stay: Payer: Self-pay | Admitting: Hospice

## 2022-05-03 ENCOUNTER — Other Ambulatory Visit: Payer: Self-pay | Admitting: *Deleted

## 2022-05-03 DIAGNOSIS — C3491 Malignant neoplasm of unspecified part of right bronchus or lung: Secondary | ICD-10-CM | POA: Diagnosis not present

## 2022-05-03 NOTE — Patient Outreach (Signed)
Per [place] eligible member currently resides in [facility]. Screened for potential care management/care coordination services as a benefit of [employer] plan and PCP.  Ms. Braver admitted to SNF on 04/22/22 after hospitalization.  Facility site visit to [facility] skilled nursing facility. Met with Elmer Ramp in [place] department who report predicted dc date is next week per [facility] insurance. Ms. Espiritu friend/DPR [name] was advised to speak with [facility] business office about applying for Medicaid. Ms Gruner has not made much progress with therapy.  Spoke with Ms. Oommen at bedside. States she lives alone. Her friend [name] assists with transportation. Ms. Wingler states typically cooked her own food. Will need mobile meals assistance if she returns home thru [facility] mobile meals program benefit. Ms. Capers gave writer permission to call her friend/DPR [name] about transition plans and needs.   Will plan outreach to Ms. Sledd's primary contact, [name].    Marthenia Rolling, MSN, RN,BSN [place] Acute Care Coordinator [phone] [facility]) [phone]  (Toll free office)

## 2022-05-05 DIAGNOSIS — E46 Unspecified protein-calorie malnutrition: Secondary | ICD-10-CM | POA: Diagnosis not present

## 2022-05-05 DIAGNOSIS — Z79899 Other long term (current) drug therapy: Secondary | ICD-10-CM | POA: Diagnosis not present

## 2022-05-05 DIAGNOSIS — C3491 Malignant neoplasm of unspecified part of right bronchus or lung: Secondary | ICD-10-CM | POA: Diagnosis not present

## 2022-05-09 ENCOUNTER — Telehealth: Payer: Self-pay | Admitting: Internal Medicine

## 2022-05-09 ENCOUNTER — Other Ambulatory Visit: Payer: Self-pay | Admitting: *Deleted

## 2022-05-09 DIAGNOSIS — L853 Xerosis cutis: Secondary | ICD-10-CM | POA: Diagnosis not present

## 2022-05-09 DIAGNOSIS — L89310 Pressure ulcer of right buttock, unstageable: Secondary | ICD-10-CM | POA: Diagnosis not present

## 2022-05-09 DIAGNOSIS — D649 Anemia, unspecified: Secondary | ICD-10-CM | POA: Diagnosis not present

## 2022-05-09 DIAGNOSIS — L22 Diaper dermatitis: Secondary | ICD-10-CM | POA: Diagnosis not present

## 2022-05-09 NOTE — Patient Outreach (Signed)
[place] Coordinator follow up. [facility] eligible member screened for potential [facility] Care Management services as a benefit of member's insurance plan. Ms. Thatch remains in [facility] SNF.   Telephone call made to Ms. Sylve's friend/DPR, [name] [phone], per Ms. Amores's previous request. Patient identifiers confirmed.   Mrs. Eulas Post reports Ms. Dory will remain at [facility] SN[facility] under private pay beginning 05/12/22. States she will not qualify for Medicaid. Mrs. Eulas Post reports Ms. Salamone is currently not walking. States Ms. Borba wants to ultimately return home. Currently Ms. Manrique's home needs extermination for bed bugs. States furniture and the like has to be thrown out first. Mrs. Eulas Post reports she his working with companies who can provide these services. Mrs. Eulas Post reports Ms. Gioffre's niece and nephew are suppose to come visit and they are going to discuss further transition plans. Discussed with Mrs. Eulas Post that returning home is not a safe option without 24/7 caregiver assistance. Mrs. Eulas Post agrees. States she is hopeful they will be able to change Ms. Rhames's mind regarding returning home.   Will continue to follow while Ms. Buening remains in [facility] SNF.    Marthenia Rolling, MSN, RN,BSN [place] Acute Care Coordinator [phone] [facility]) [phone]  (Toll free office)

## 2022-05-10 ENCOUNTER — Other Ambulatory Visit: Payer: Self-pay | Admitting: *Deleted

## 2022-05-10 NOTE — Patient Outreach (Signed)
[facility] Post- Acute Care Coordinator follow up. Per [place] eligible member currently resides in [facility].  Screening for potential [facility] care coordination/care management services as a benefit of member's insurance plan and PCP with [facility].   Facility site visit to [facility] skilled nursing facility. Met with [facility] SNF discharge planner assistant and [place], [place] SW to discuss transition plans. Ms. Pat family visited on Tuesday. States family does not want to apply for Medicaid. States Ms. Sweeten will pay privately for 2 weeks, at least, while Ms. Melder's home is being exterminated. Ms. Sides does not want to stay LTC. Wants to return home. However, she will need 24/7 caregiver assistance. Lived alone prior to admission.   Will follow up with friend/DPR [name]. Will continue to follow.    Marthenia Rolling, MSN, RN,BSN [place] Acute Care Coordinator [phone] [facility]) [phone]  (Toll free office)

## 2022-05-11 DIAGNOSIS — E43 Unspecified severe protein-calorie malnutrition: Secondary | ICD-10-CM | POA: Diagnosis not present

## 2022-05-11 DIAGNOSIS — R627 Adult failure to thrive: Secondary | ICD-10-CM | POA: Diagnosis not present

## 2022-05-11 DIAGNOSIS — C3491 Malignant neoplasm of unspecified part of right bronchus or lung: Secondary | ICD-10-CM | POA: Diagnosis not present

## 2022-05-12 DIAGNOSIS — C3491 Malignant neoplasm of unspecified part of right bronchus or lung: Secondary | ICD-10-CM | POA: Diagnosis not present

## 2022-05-12 DIAGNOSIS — R1312 Dysphagia, oropharyngeal phase: Secondary | ICD-10-CM | POA: Diagnosis not present

## 2022-05-12 DIAGNOSIS — R627 Adult failure to thrive: Secondary | ICD-10-CM | POA: Diagnosis not present

## 2022-05-12 DIAGNOSIS — E43 Unspecified severe protein-calorie malnutrition: Secondary | ICD-10-CM | POA: Diagnosis not present

## 2022-05-16 DIAGNOSIS — L22 Diaper dermatitis: Secondary | ICD-10-CM | POA: Diagnosis not present

## 2022-05-16 DIAGNOSIS — C3491 Malignant neoplasm of unspecified part of right bronchus or lung: Secondary | ICD-10-CM | POA: Diagnosis not present

## 2022-05-16 DIAGNOSIS — E43 Unspecified severe protein-calorie malnutrition: Secondary | ICD-10-CM | POA: Diagnosis not present

## 2022-05-16 DIAGNOSIS — R627 Adult failure to thrive: Secondary | ICD-10-CM | POA: Diagnosis not present

## 2022-05-17 ENCOUNTER — Other Ambulatory Visit: Payer: Self-pay | Admitting: *Deleted

## 2022-05-17 NOTE — Patient Outreach (Signed)
[facility] Post- Acute Care Coordinator follow up. Ms. Sigala resides in [facility] SNF.   Met with Elmer Ramp with SNF SW department. Ms.  Venne has been issued [place]. Hospice referral has been made.  Will sign off. No identifiable [facility] needs.    Marthenia Rolling, MSN, RN,BSN [place] Acute Care Coordinator [phone] [facility]) [phone]  (Toll free office)

## 2022-05-19 DIAGNOSIS — R627 Adult failure to thrive: Secondary | ICD-10-CM | POA: Diagnosis not present

## 2022-05-19 DIAGNOSIS — L22 Diaper dermatitis: Secondary | ICD-10-CM | POA: Diagnosis not present

## 2022-05-22 DIAGNOSIS — C3491 Malignant neoplasm of unspecified part of right bronchus or lung: Secondary | ICD-10-CM | POA: Diagnosis not present

## 2022-05-22 DIAGNOSIS — D649 Anemia, unspecified: Secondary | ICD-10-CM | POA: Diagnosis not present

## 2022-05-22 DIAGNOSIS — R627 Adult failure to thrive: Secondary | ICD-10-CM | POA: Diagnosis not present

## 2022-05-22 DIAGNOSIS — E43 Unspecified severe protein-calorie malnutrition: Secondary | ICD-10-CM | POA: Diagnosis not present

## 2022-05-23 DIAGNOSIS — L22 Diaper dermatitis: Secondary | ICD-10-CM | POA: Diagnosis not present

## 2022-05-23 DIAGNOSIS — C3491 Malignant neoplasm of unspecified part of right bronchus or lung: Secondary | ICD-10-CM | POA: Diagnosis not present

## 2022-05-23 DIAGNOSIS — E43 Unspecified severe protein-calorie malnutrition: Secondary | ICD-10-CM | POA: Diagnosis not present

## 2022-05-23 DIAGNOSIS — R1312 Dysphagia, oropharyngeal phase: Secondary | ICD-10-CM | POA: Diagnosis not present

## 2022-05-23 DIAGNOSIS — R627 Adult failure to thrive: Secondary | ICD-10-CM | POA: Diagnosis not present

## 2022-05-29 DIAGNOSIS — K219 Gastro-esophageal reflux disease without esophagitis: Secondary | ICD-10-CM | POA: Diagnosis not present

## 2022-05-29 DIAGNOSIS — R627 Adult failure to thrive: Secondary | ICD-10-CM | POA: Diagnosis not present

## 2022-05-29 DIAGNOSIS — I10 Essential (primary) hypertension: Secondary | ICD-10-CM | POA: Diagnosis not present

## 2022-05-29 DIAGNOSIS — R1312 Dysphagia, oropharyngeal phase: Secondary | ICD-10-CM | POA: Diagnosis not present

## 2022-05-29 DIAGNOSIS — E43 Unspecified severe protein-calorie malnutrition: Secondary | ICD-10-CM | POA: Diagnosis not present

## 2022-05-29 DIAGNOSIS — E876 Hypokalemia: Secondary | ICD-10-CM | POA: Diagnosis not present

## 2022-05-29 DIAGNOSIS — D649 Anemia, unspecified: Secondary | ICD-10-CM | POA: Diagnosis not present

## 2022-06-05 ENCOUNTER — Telehealth: Payer: Self-pay | Admitting: Family Medicine

## 2022-06-05 NOTE — Telephone Encounter (Signed)
Pt friend catherine is calling to report pt passed away on 2022-06-25 and the funeral will be this Wednesday on 06-07-2022

## 2022-06-16 ENCOUNTER — Telehealth: Payer: Medicare Other

## 2022-06-16 DEATH — deceased

## 2022-07-10 ENCOUNTER — Telehealth: Payer: Self-pay | Admitting: *Deleted

## 2022-07-10 NOTE — Telephone Encounter (Signed)
Called patient's friend [name] to inform of Ct for 07-25-22- arrival time- 2:30 pm @ [facility], no restrictions to test, patient to receive results from Dr. Sondra Come on 07-27-22 @ 11:45 am, lvm for a return call

## 2022-07-25 ENCOUNTER — Ambulatory Visit (HOSPITAL_COMMUNITY): Admission: RE | Admit: 2022-07-25 | Payer: Medicare Other | Source: Ambulatory Visit

## 2022-07-27 ENCOUNTER — Ambulatory Visit: Payer: Medicare Other | Admitting: Radiation Oncology

## 2022-07-28 ENCOUNTER — Other Ambulatory Visit: Payer: Medicare Other

## 2022-07-31 ENCOUNTER — Ambulatory Visit: Payer: Medicare Other | Admitting: Internal Medicine

## 2022-10-10 IMAGING — US US BREAST*L* LIMITED INC AXILLA
1 series · 11 of 11 positions shown · non-contrast
Comparison: Previous exam(s).

CLINICAL DATA: 79-year-old female recalled from screening mammogram
dated 11/15/2020 for possible bilateral masses with calcifications
on the left.



[Series 1: us breast*left* limited inc axilla · 0.06mm/px · 11 of 11 slices shown]
[im 1/11]
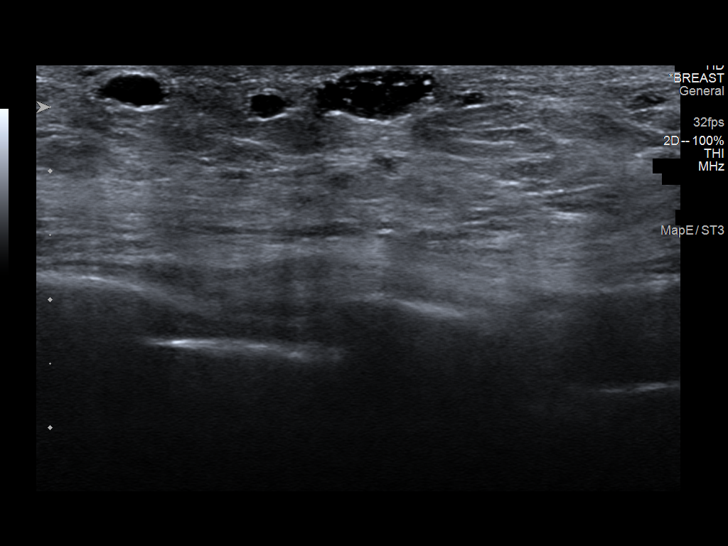
[im 2/11]
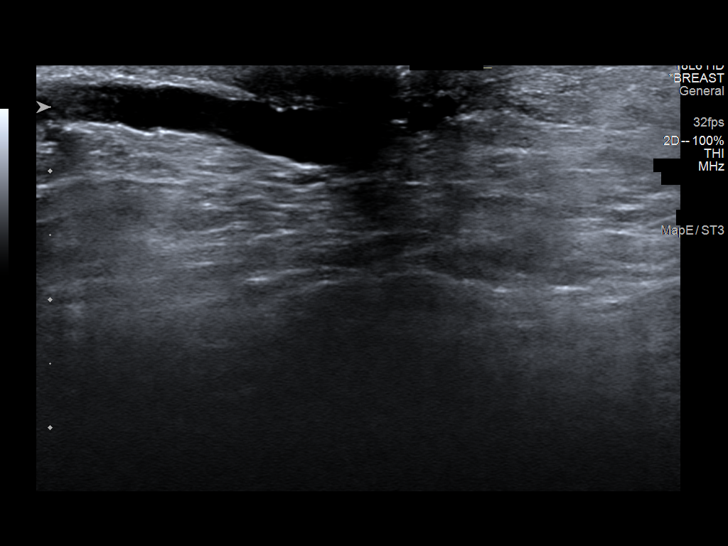
[im 3/11]
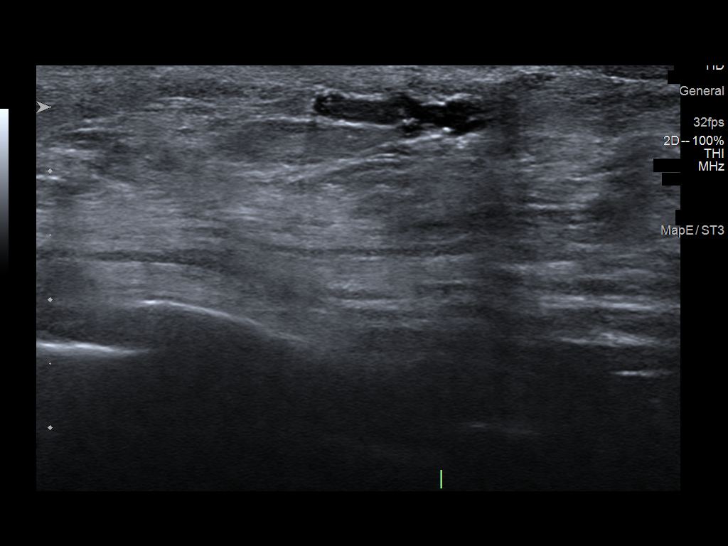
[im 4/11]
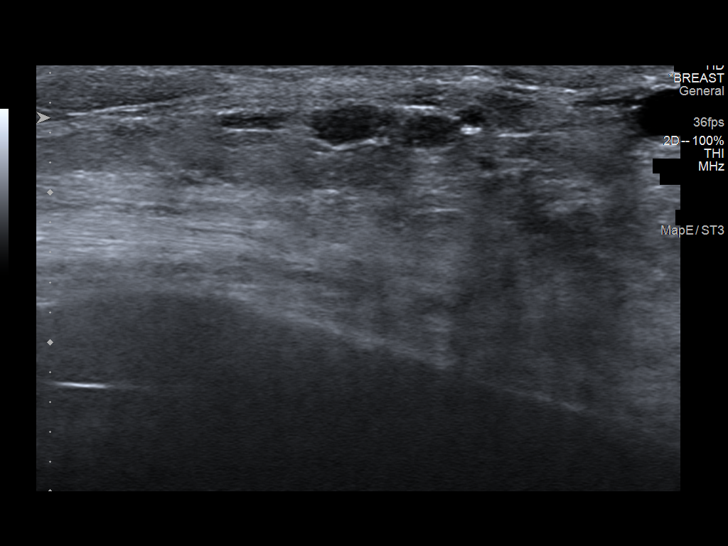
[im 5/11]
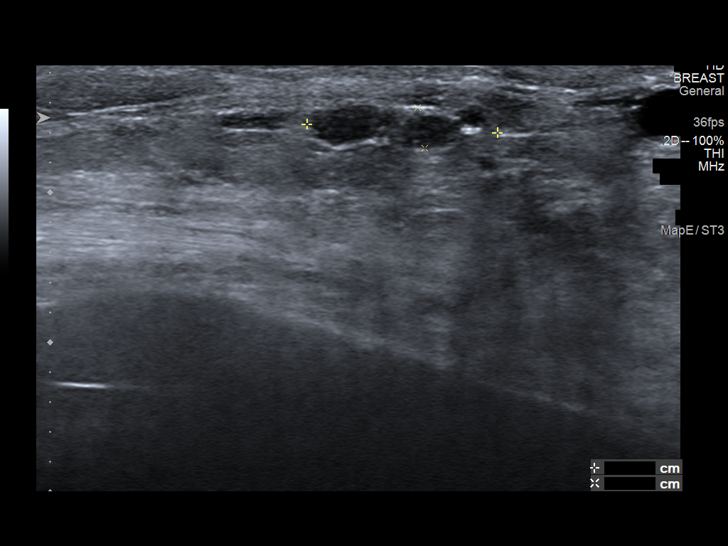
[im 6/11]
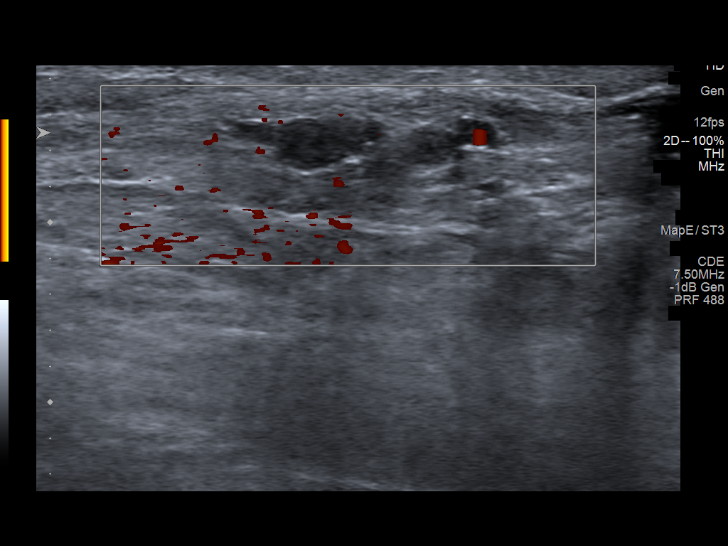
[im 7/11]
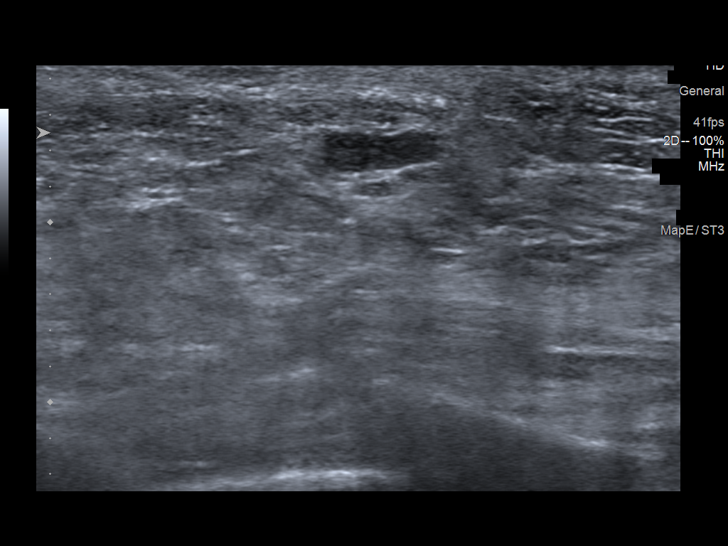
[im 8/11]
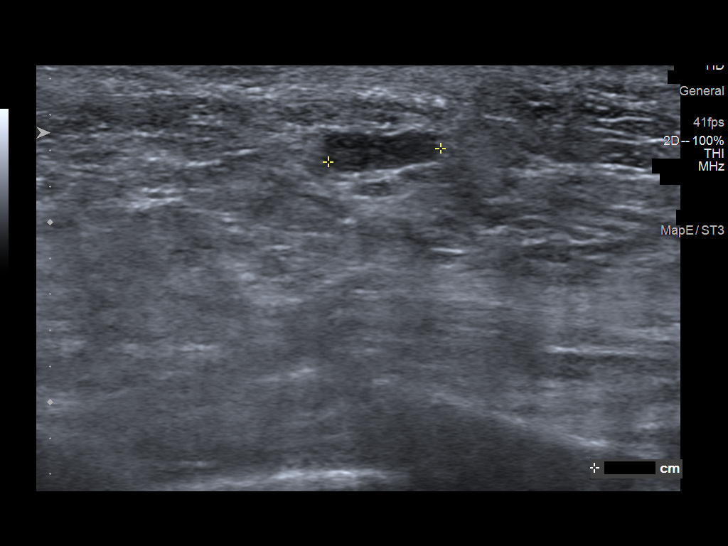
[im 9/11]
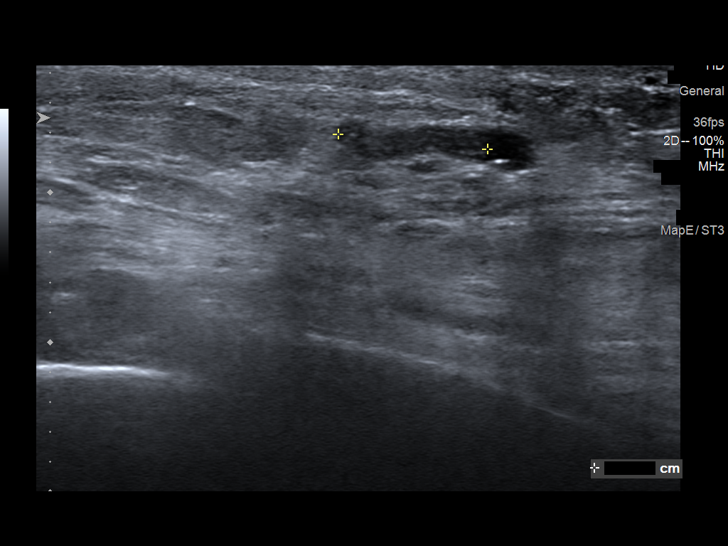
[im 10/11]
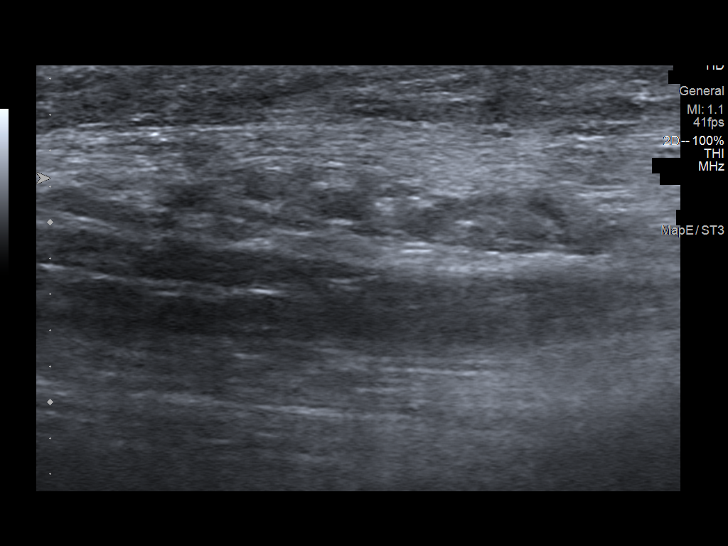
[im 11/11]
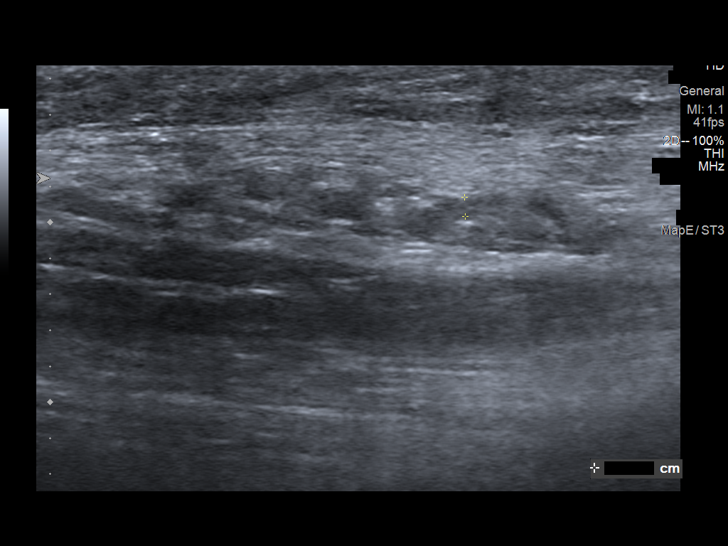

[11 of 11 positions shown; findings below may reference images not displayed]

ACR Breast Density Category b: There are scattered areas of
fibroglandular density.
FINDINGS: There is a persistent oval, circumscribed lobulated mass in the
upper outer right breast. Further evaluation with ultrasound was
performed.

There is a persistent tortuous duct behind the left nipple with
associated coarse calcifications. Further evaluation with ultrasound
was performed.

Targeted ultrasound is performed, showing an oval, circumscribed
anechoic mass at the 2 o'clock position 3 cm from the nipple on the
right. It measures 10 x 7 x 5 mm. There is no internal vascularity.
This is consistent with a benign simple cyst and corresponds with
the screening mammographic finding.

Evaluation of the left breast demonstrates a dilated duct with an
internal hypoechoic mass at the 8 o'clock position 3 cm from the
nipple. Exact measurements are difficult due to the tortuous nature
of the fat duct but measures approximately 1.3 x 0.6 x 0.7 cm. There
is mild internal blood flow. Echogenic foci along the periphery of
the mass may represent the calcifications identified
mammographically.

Evaluation of the left axilla demonstrates no suspicious
lymphadenopathy.
IMPRESSION: 1. Indeterminate intraductal mass in the left breast 8 o'clock
position with associated echogenic foci likely representing
calcifications. Recommendation is for ultrasound-guided biopsy with
close attention on post clip films to ensure mammographic
correlation. Consider specimen radiograph at the time of biopsy.
2. No suspicious left axillary lymphadenopathy.
3. Benign right breast simple cyst corresponding with the screening
mammographic findings. No further imaging follow-up required.

RECOMMENDATION:
Ultrasound-guided biopsy of the left breast.

I have discussed the findings and recommendations with the patient.
If applicable, a reminder letter will be sent to the patient
regarding the next appointment.

BI-RADS CATEGORY  4: Suspicious.

## 2022-10-10 IMAGING — US US BREAST*R* LIMITED INC AXILLA
1 series · 6 of 6 positions shown · non-contrast
Comparison: Previous exam(s).

CLINICAL DATA: 79-year-old female recalled from screening mammogram
dated 11/15/2020 for possible bilateral masses with calcifications
on the left.



[Series 1: us breast*right* limited inc axilla · 0.06mm/px · 6 of 6 slices shown]
[im 1/6]
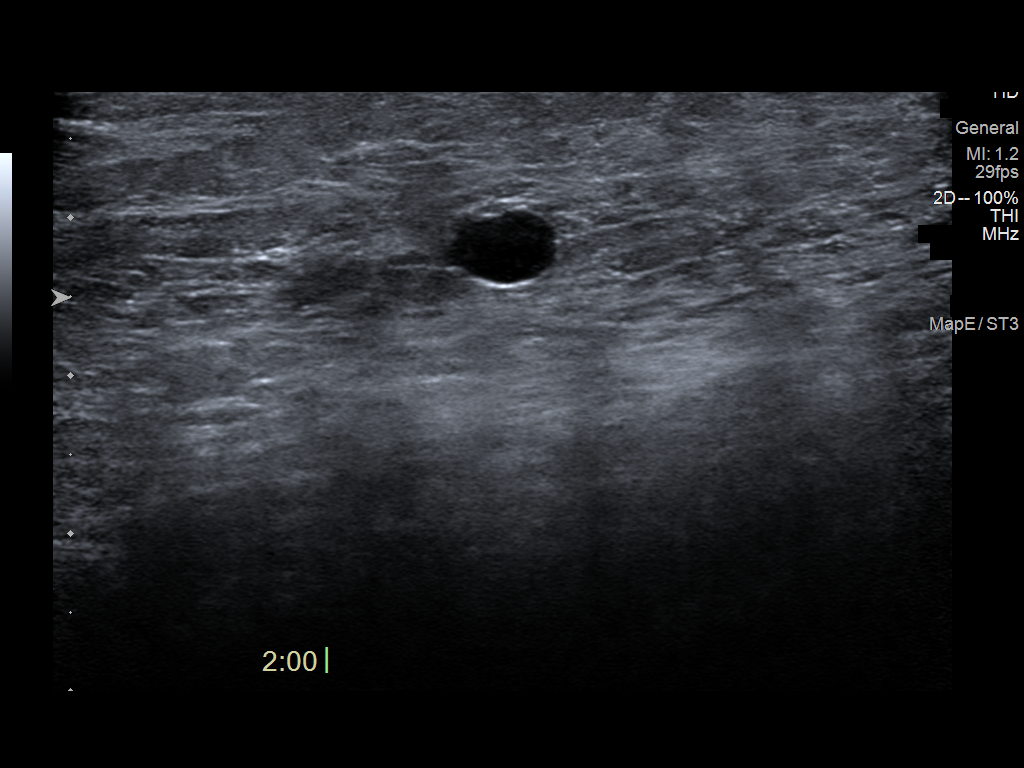
[im 2/6]
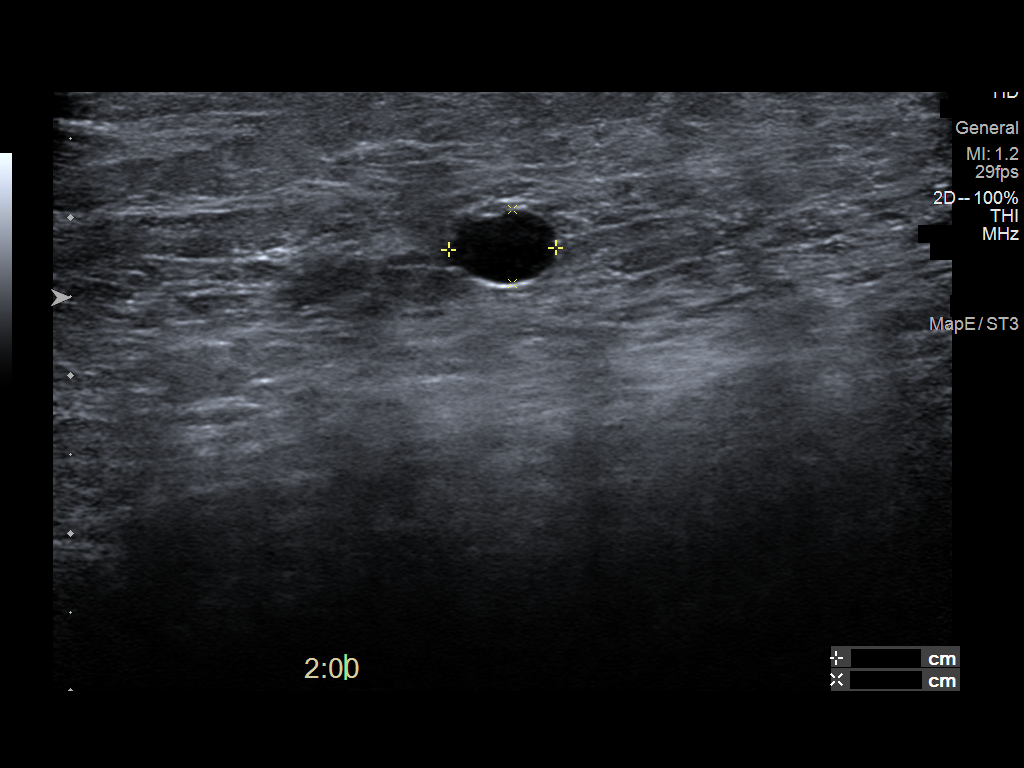
[im 3/6]
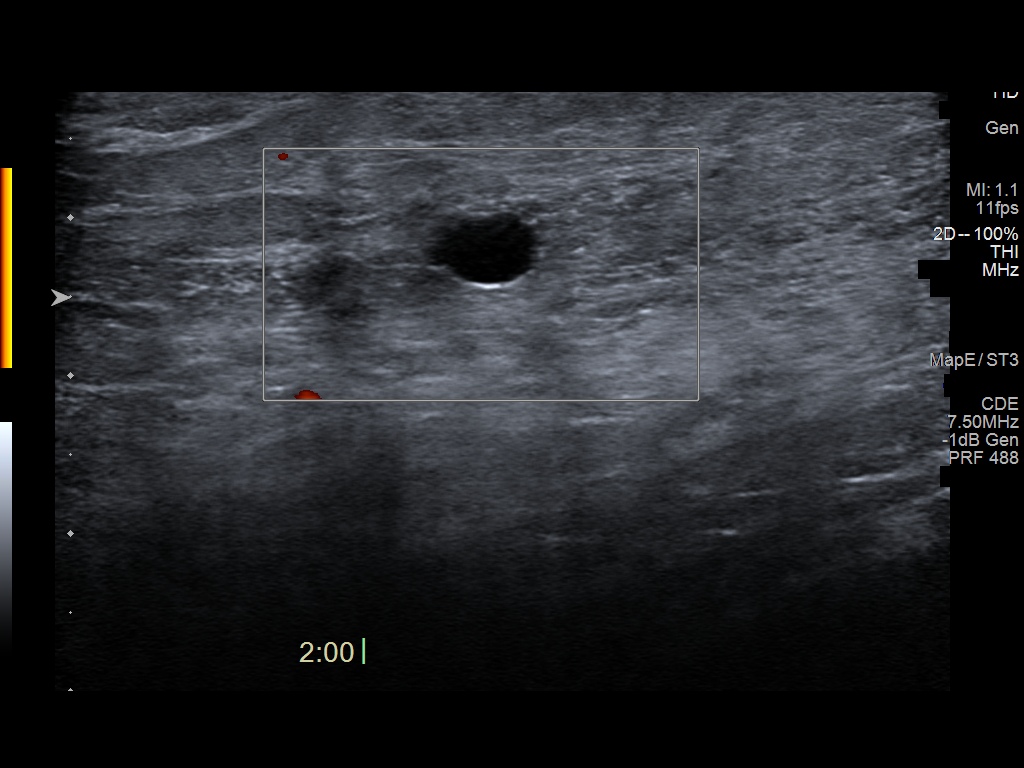
[im 4/6]
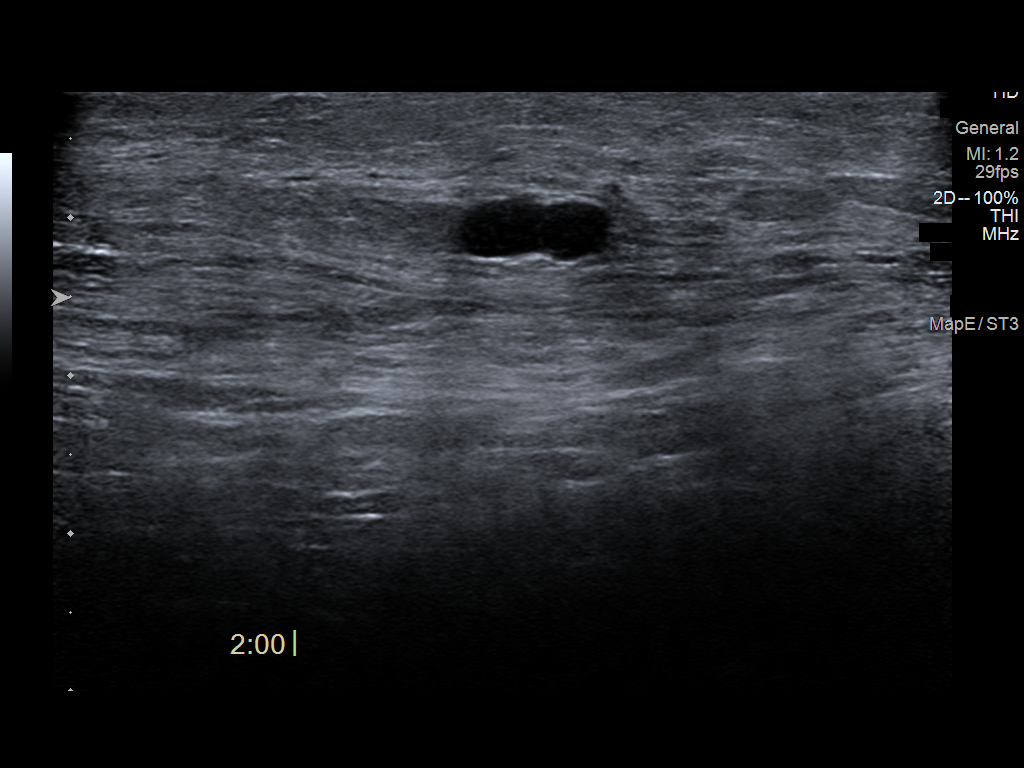
[im 5/6]
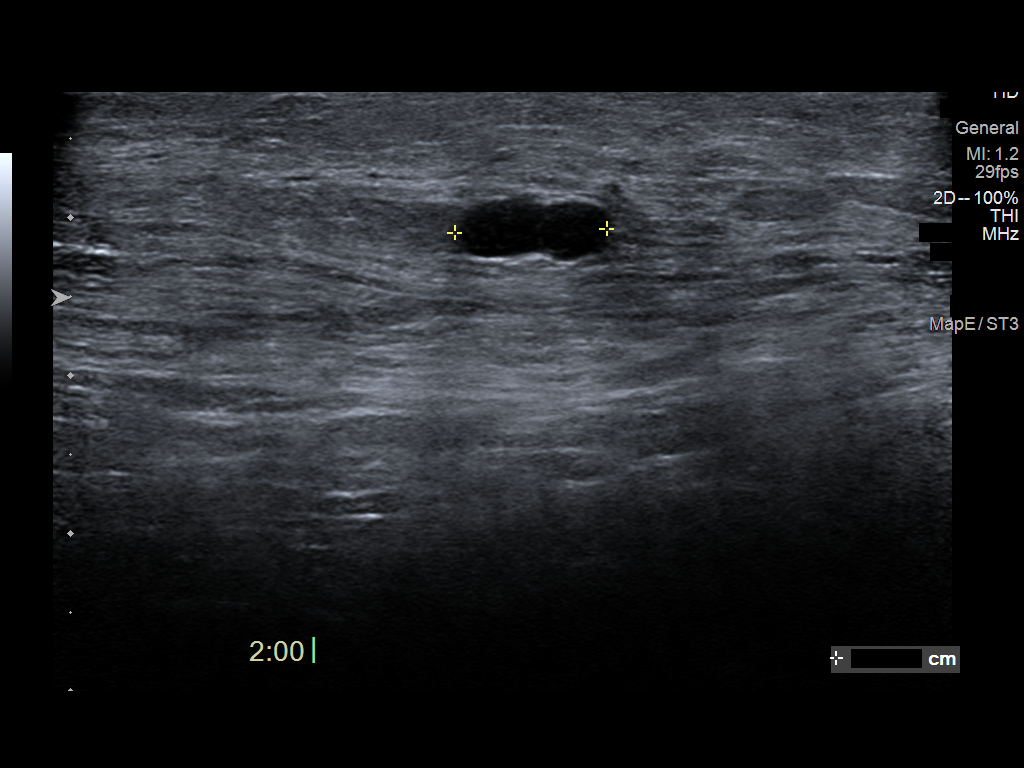
[im 6/6]
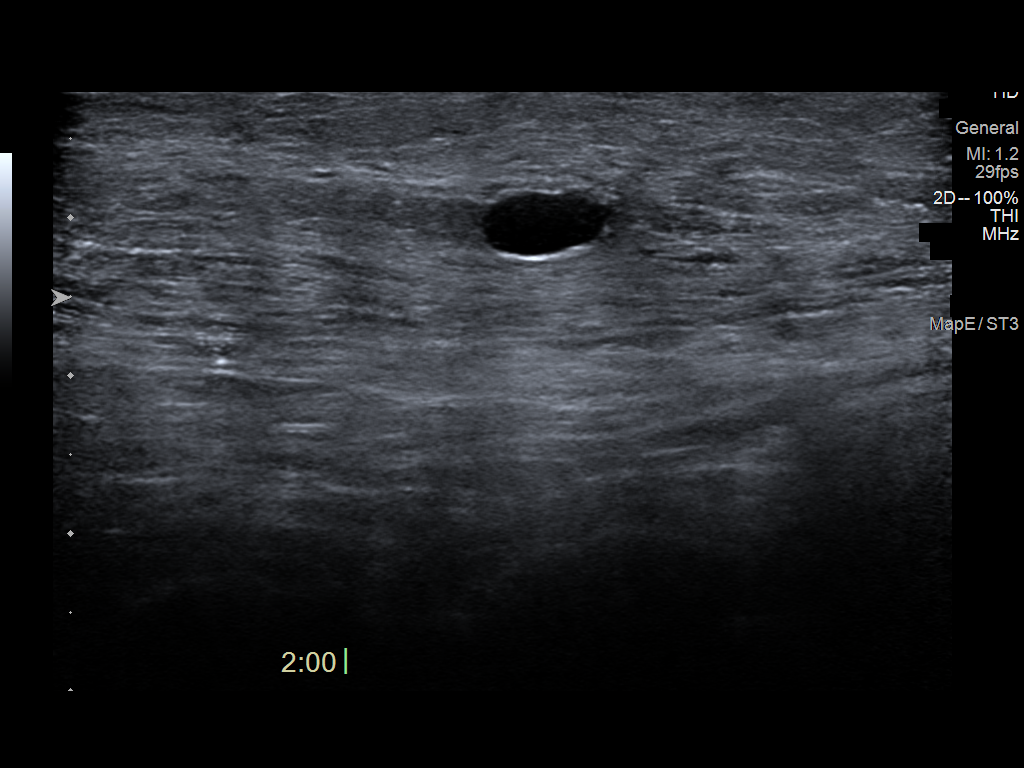

[6 of 6 positions shown; findings below may reference images not displayed]

ACR Breast Density Category b: There are scattered areas of
fibroglandular density.
FINDINGS: There is a persistent oval, circumscribed lobulated mass in the
upper outer right breast. Further evaluation with ultrasound was
performed.

There is a persistent tortuous duct behind the left nipple with
associated coarse calcifications. Further evaluation with ultrasound
was performed.

Targeted ultrasound is performed, showing an oval, circumscribed
anechoic mass at the 2 o'clock position 3 cm from the nipple on the
right. It measures 10 x 7 x 5 mm. There is no internal vascularity.
This is consistent with a benign simple cyst and corresponds with
the screening mammographic finding.

Evaluation of the left breast demonstrates a dilated duct with an
internal hypoechoic mass at the 8 o'clock position 3 cm from the
nipple. Exact measurements are difficult due to the tortuous nature
of the fat duct but measures approximately 1.3 x 0.6 x 0.7 cm. There
is mild internal blood flow. Echogenic foci along the periphery of
the mass may represent the calcifications identified
mammographically.

Evaluation of the left axilla demonstrates no suspicious
lymphadenopathy.
IMPRESSION: 1. Indeterminate intraductal mass in the left breast 8 o'clock
position with associated echogenic foci likely representing
calcifications. Recommendation is for ultrasound-guided biopsy with
close attention on post clip films to ensure mammographic
correlation. Consider specimen radiograph at the time of biopsy.
2. No suspicious left axillary lymphadenopathy.
3. Benign right breast simple cyst corresponding with the screening
mammographic findings. No further imaging follow-up required.

RECOMMENDATION:
Ultrasound-guided biopsy of the left breast.

I have discussed the findings and recommendations with the patient.
If applicable, a reminder letter will be sent to the patient
regarding the next appointment.

BI-RADS CATEGORY  4: Suspicious.

## 2022-10-11 IMAGING — US US BREAST BX W LOC DEV 1ST LESION IMG BX SPEC US GUIDE*L*
1 series · 9 of 9 positions shown · non-contrast
Comparison: Previous exam(s).
COMPARISON: Previous exam(s).

Addendum:
CLINICAL DATA: 79-year-old with a screening detected indeterminate
approximate 1.2 cm intraductal mass in the LOWER INNER QUADRANT of
the LEFT breast at the 8 o'clock position approximately 3 cm from
nipple.

EXAM:
ULTRASOUND GUIDED LEFT BREAST CORE NEEDLE BIOPSY

[Series 1: us breast bx w loc dev 1st lesion img bx spec us g · 0.06mm/px · 9 of 9 slices shown]
[im 1/9]
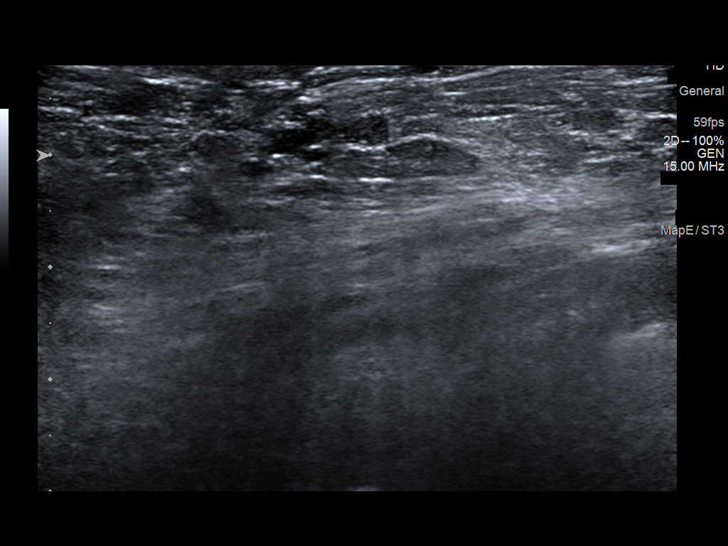
[im 2/9]
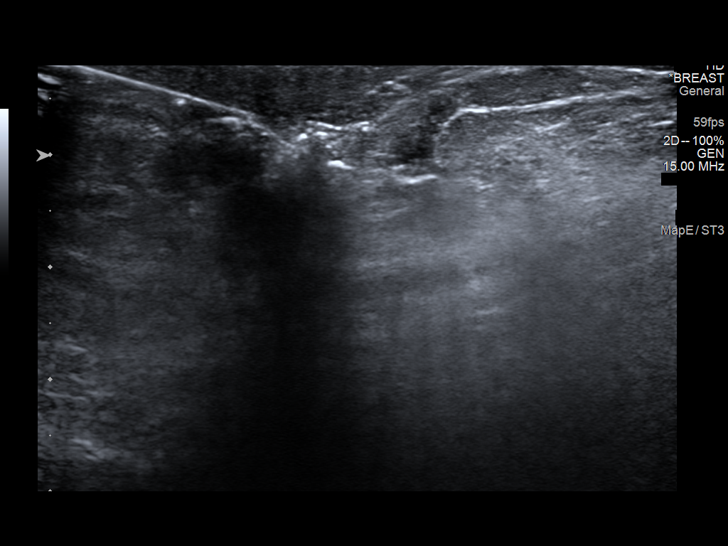
[im 3/9]
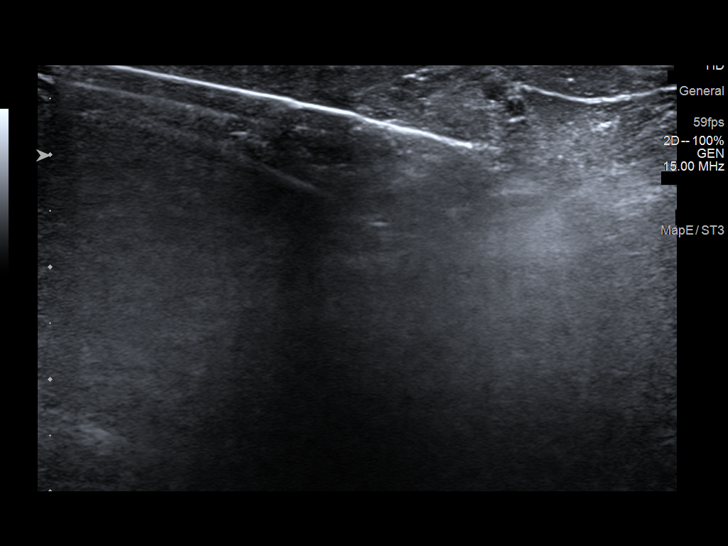
[im 4/9]
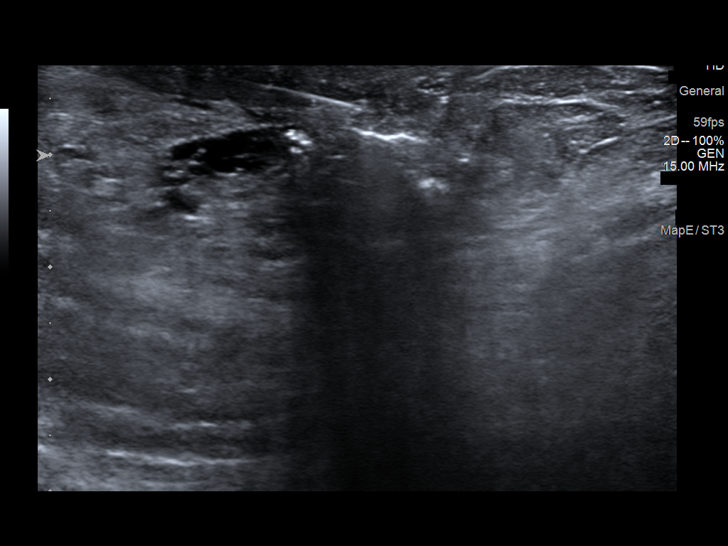
[im 5/9]
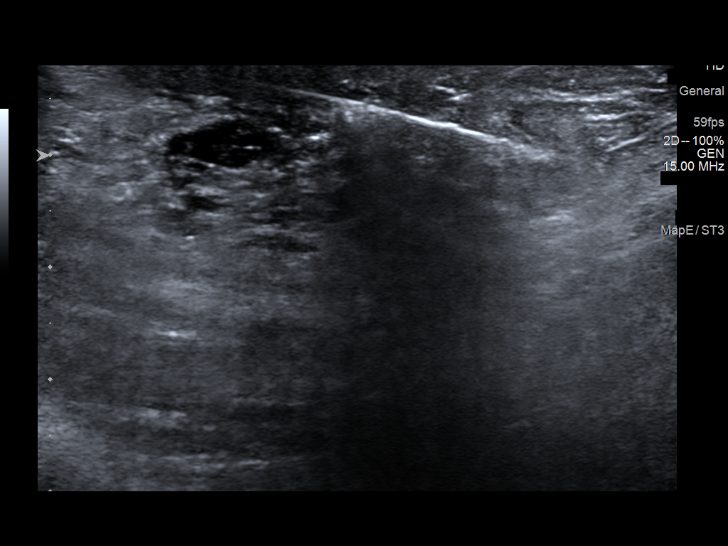
[im 6/9]
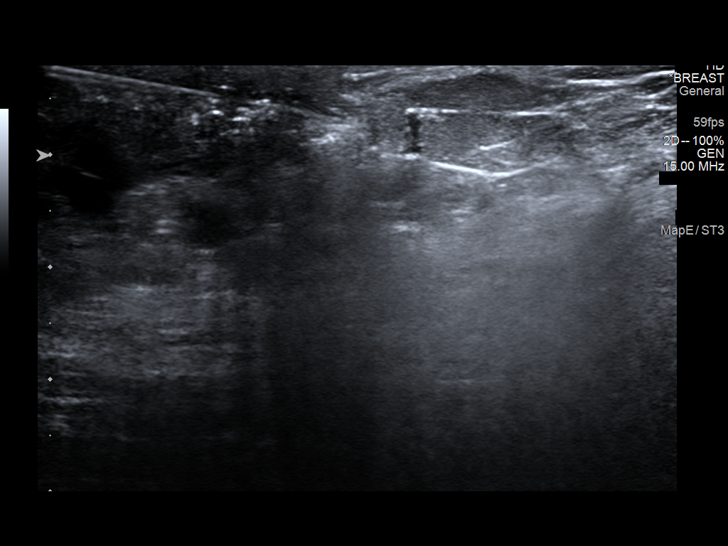
[im 7/9]
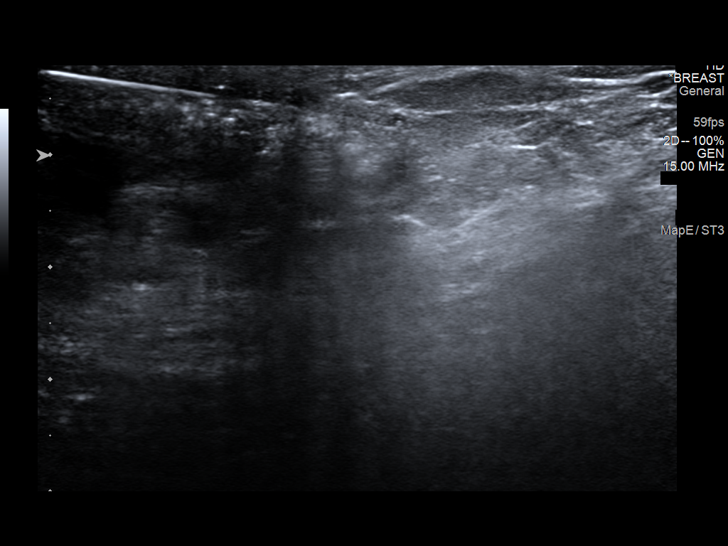
[im 8/9]
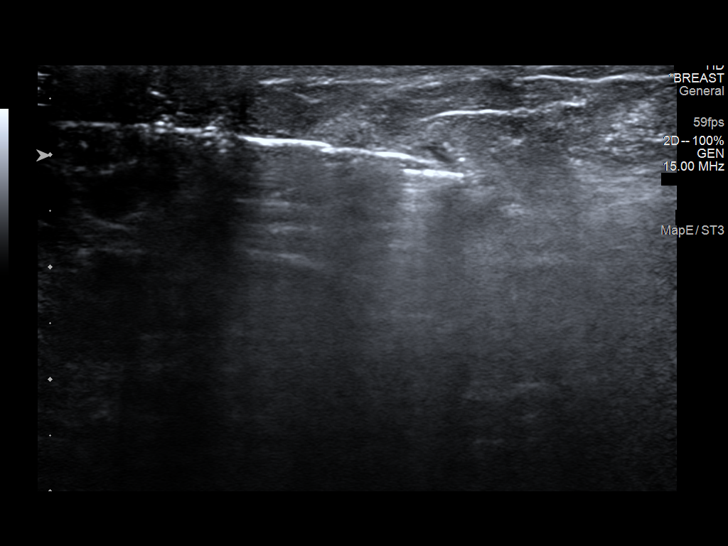
[im 9/9]
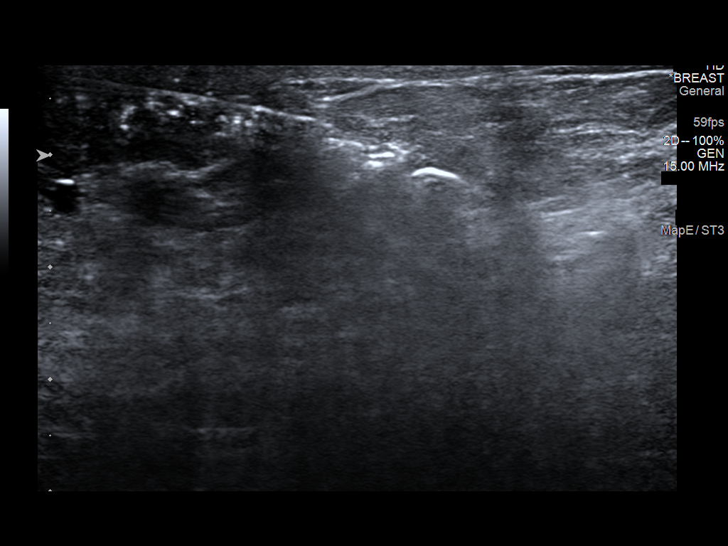

[9 of 9 positions shown; findings below may reference images not displayed]



Lesion quadrant: LOWER INNER QUADRANT.

Using sterile technique with chlorhexidine as skin antisepsis, 1%
lidocaine with epinephrine as local anesthetic, under direct
ultrasound visualization, a 12 gauge Tuyet Shae core needle device
placed through an 11 gauge introducer needle was used to perform
biopsy of the intraductal mass in the LOWER INNER LEFT breast using
an inferolateral approach.

At the conclusion of the procedure a ribbon shaped tissue marker
clip was deployed into the biopsy cavity. Follow up 2 view mammogram
was performed and dictated separately.
IMPRESSION: Ultrasound guided biopsy of an intraductal mass in the LOWER INNER
QUADRANT of the LEFT breast. No apparent complications.

ADDENDUM:
Pathology revealed HYALINIZED INTRADUCTAL PAPILLOMA WITH
CALCIFICATIONS of the Left breast, lower outer quadrant, 8 o'clock,
7cmfn. This was found to be concordant by Dr. Gudrun Gathers, with
surgical consultation for consideration of excision recommended.

Pathology results were discussed with the patient by telephone. The
patient reported doing well after the biopsy with minimal tenderness
at the site. Post biopsy instructions and care were reviewed and
questions were answered. The patient was encouraged to call The
direct phone number was provided.

Surgical consultation has been arranged with Dr. Paulus N Ceejay at
[REDACTED] on December 27, 2020.

Pathology results reported by Valentin Price, RN on 12/02/2020.



Lesion quadrant: LOWER INNER QUADRANT.

Using sterile technique with chlorhexidine as skin antisepsis, 1%
lidocaine with epinephrine as local anesthetic, under direct
ultrasound visualization, a 12 gauge Tuyet Shae core needle device
placed through an 11 gauge introducer needle was used to perform
biopsy of the intraductal mass in the LOWER INNER LEFT breast using
an inferolateral approach.

At the conclusion of the procedure a ribbon shaped tissue marker
clip was deployed into the biopsy cavity. Follow up 2 view mammogram
was performed and dictated separately.
IMPRESSION: Ultrasound guided biopsy of an intraductal mass in the LOWER INNER
QUADRANT of the LEFT breast. No apparent complications.

## 2023-03-28 IMAGING — CT CT ANGIO CHEST
2 of 7 series · 13 of 36 positions shown · IV contrast (omnipaque)
Comparison: None.

CLINICAL DATA: PE suspected, former smoker

EXAM:
CT ANGIOGRAPHY CHEST WITH CONTRAST
TECHNIQUE: Multidetector CT imaging of the chest was performed using the
standard protocol during bolus administration of intravenous
contrast. Multiplanar CT image reconstructions and MIPs were
obtained to evaluate the vascular anatomy.
CONTRAST:  60mL OMNIPAQUE IOHEXOL 350 MG/ML SOLN

[Series 5: pe axial thins · axial · 0.67mm/px · z∈[+1245,+1499]mm · 12 of 302 slices shown]
[im 24/302  lung]
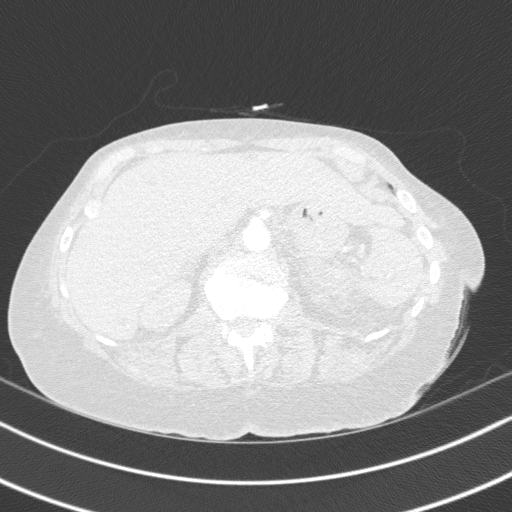
[im 47/302  mediastinal]
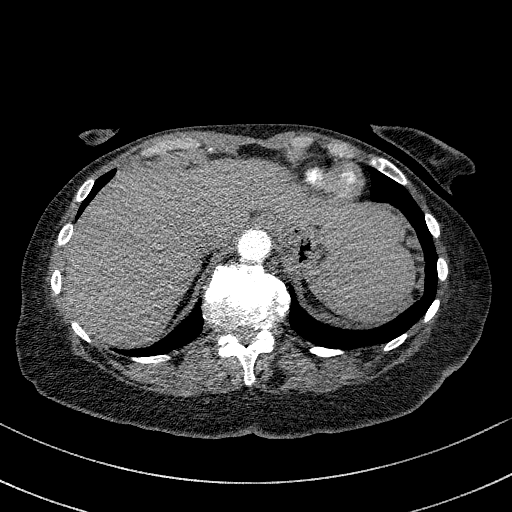
[im 70/302  lung]
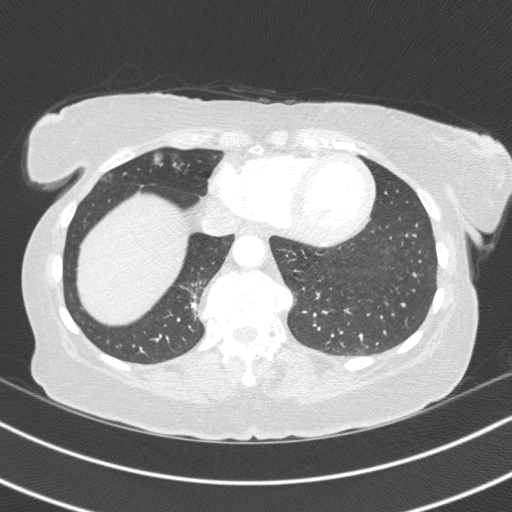
[im 93/302  mediastinal]
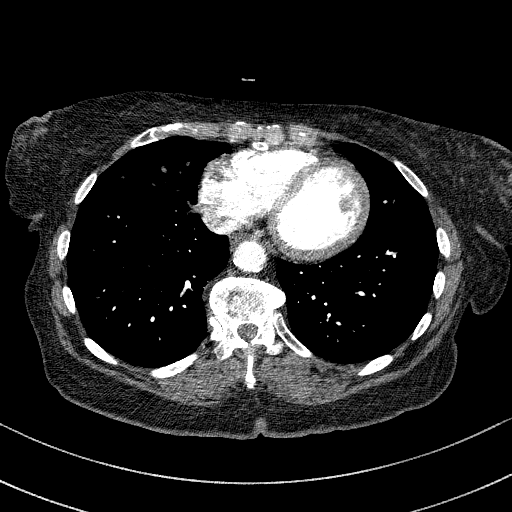
[im 116/302  lung]
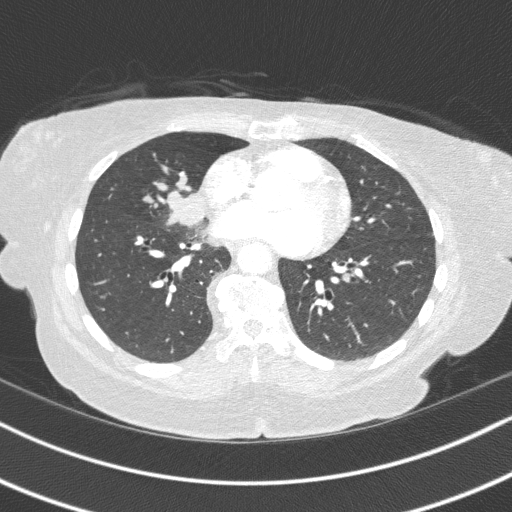
[im 139/302  mediastinal]
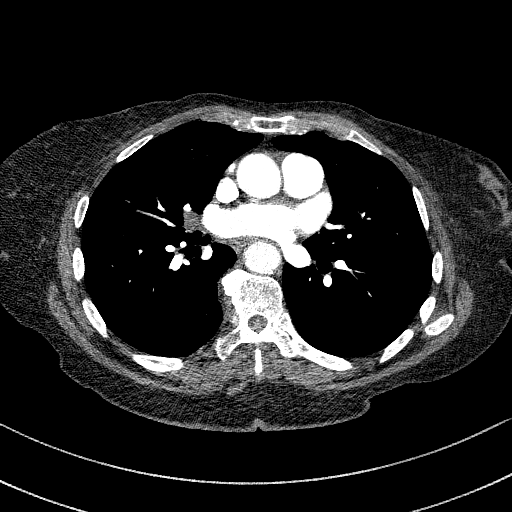
[im 163/302  lung]
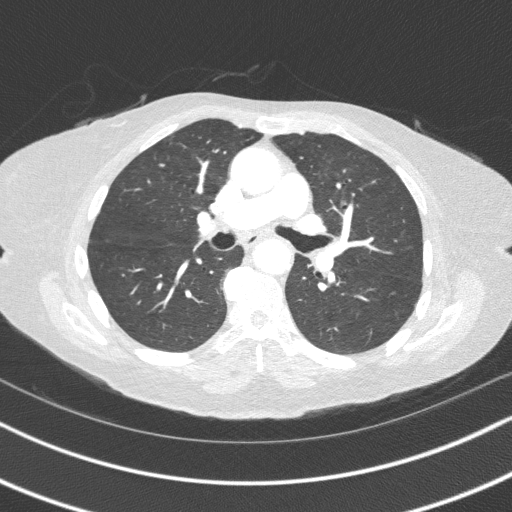
[im 186/302  mediastinal]
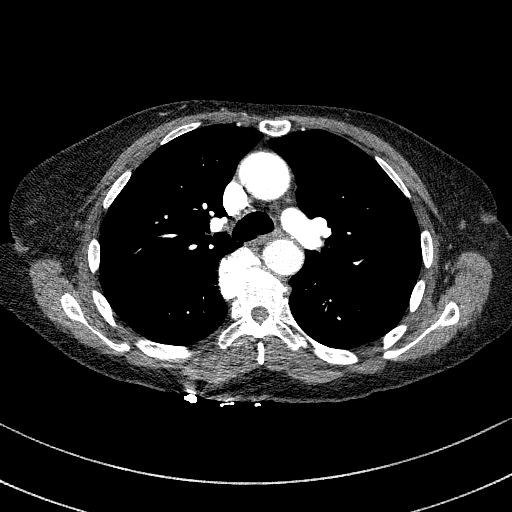
[im 209/302  lung]
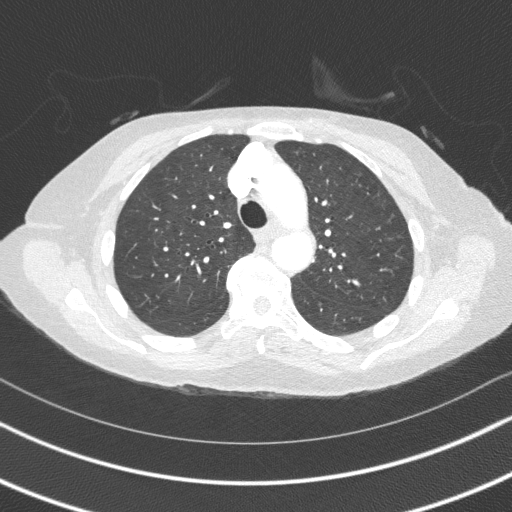
[im 232/302  mediastinal]
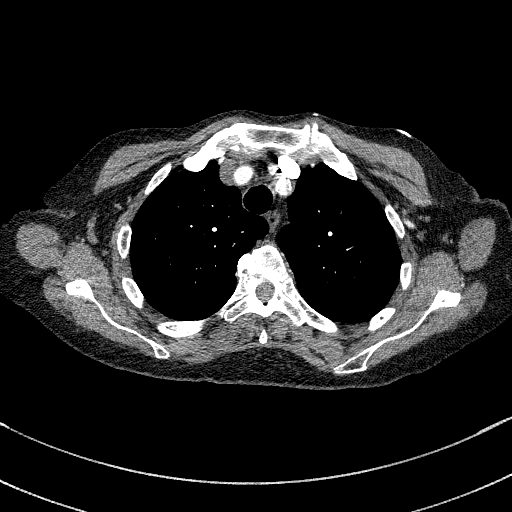
[im 255/302  lung]
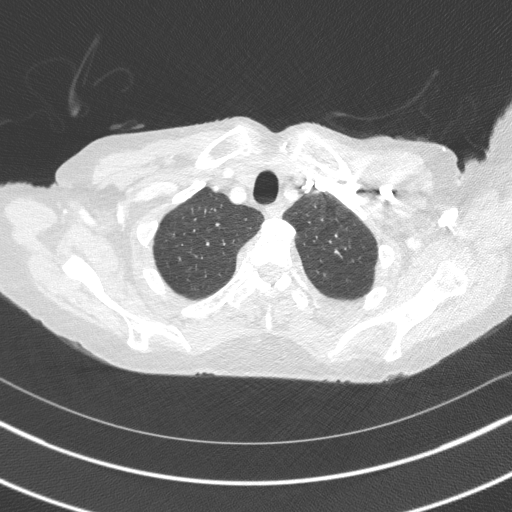
[im 278/302  mediastinal]
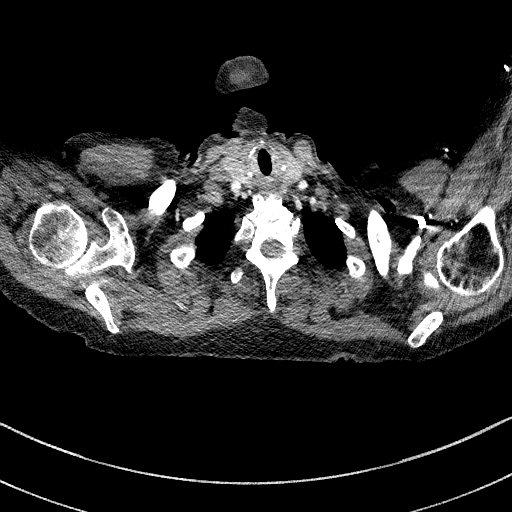

[Series 7: cor soft · coronal · 0.59mm/px · 1 of 122 slices shown]
[im 61/122  mediastinal]
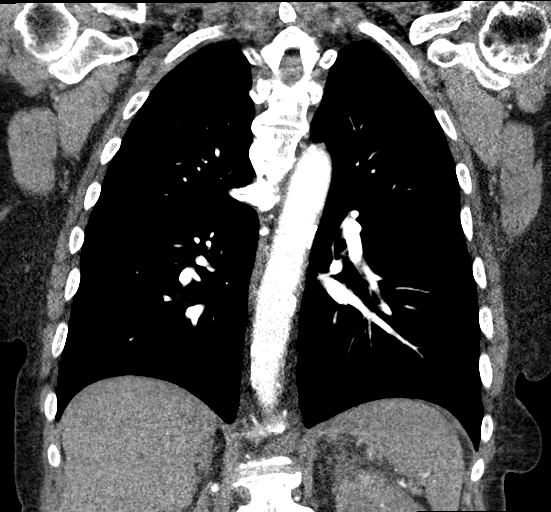

[13 of 36 positions shown; findings below may reference images not displayed]

FINDINGS: Cardiovascular: Preferential opacification of the pulmonary
arteries. No evidence of filling defect. Normal heart size.
Scattered left coronary artery calcifications no pericardial
effusion. Aortic atherosclerosis.

Mediastinum/Nodes: No enlarged mediastinal, hilar, or axillary lymph
nodes. Thyroid gland, trachea, and esophagus demonstrate no
significant findings.

Lungs/Pleura: Mild centrilobular emphysema. There is a perihilar
mass of the right middle lobe measuring 2.6 x 2.5 cm, which closely
abuts the pleura and pericardium (series 4, image 91). This lesion
obstructs the middle lobe bronchi, with bronchial impaction and
volume loss of the right middle lobe. There are multiple small,
predominantly ground-glass nodules throughout the right middle lobe,
likely postobstructive airspace disease (series 6, image 118).
Additional small nonspecific nodules of the right middle and right
lower lobes about the right major fissure measuring 0.7 and 0.6 cm
(series 6, image 76) no pleural effusion or pneumothorax.

Upper Abdomen: No acute abnormality. Incompletely imaged fluid
attenuation lesion of the right lobe of the liver, presumably a cyst
or hemangioma (series 5, image 302).

Musculoskeletal: No chest wall mass or suspicious bone lesions
identified.
IMPRESSION: 1. Negative examination for pulmonary embolism.
2. There is a perihilar mass of the right middle lobe measuring
x 2.5 cm, which closely abuts the pleura and pericardium. This
lesion is consistent with primary lung malignancy, and obstructs the
middle lobe bronchi, with bronchial impaction and volume loss of the
right middle lobe.
3. There are multiple small, predominantly ground-glass nodules
throughout the right middle lobe, likely postobstructive airspace
disease.
4. Additional small nonspecific nodules of the right middle and
right lower lobes about the right major fissure measuring 0.7 and
0.6 cm.
5. No lymphadenopathy.
6. Emphysema.
7. Coronary artery disease.

These results will be called to the ordering clinician or
representative by the Radiologist Assistant, and communication
documented in the PACS or [REDACTED].

Aortic Atherosclerosis (HON66-JLR.R) and Emphysema ([id number].W).

## 2023-04-25 IMAGING — CT NM PET TUM IMG INITIAL (PI) SKULL BASE T - THIGH
7 series · 25 of 25 positions shown · non-contrast
Comparison: CT chest 05/18/2021

CLINICAL DATA: Initial treatment strategy for lung nodule.

EXAM:
NUCLEAR MEDICINE PET SKULL BASE TO THIGH
TECHNIQUE: 6.0 mCi F-18 FDG was injected intravenously. Full-ring PET imaging
was performed from the skull base to thigh after the radiotracer. CT
data was obtained and used for attenuation correction and anatomic
localization.
Fasting blood glucose: 118 mg/dl

[Series 3: pet sk_thigh ac · axial · 5.0mm · 4.07mm/px · z∈[-516,+320]mm · 6 of 210 slices shown]
[im 1/210]
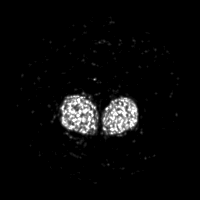
[im 42/210]
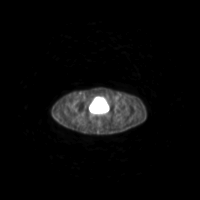
[im 84/210]
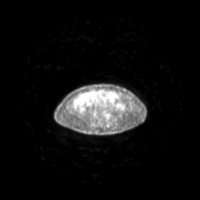
[im 126/210]
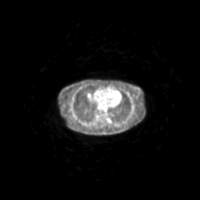
[im 168/210]
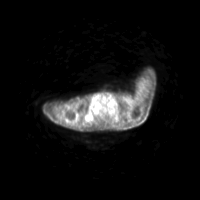
[im 210/210]
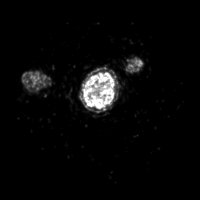

[Series 4: ct sk_thigh 5.0 bf37 · axial · 5.0mm · 0.98mm/px · z∈[-516,+320]mm · 5 of 210 slices shown]
[im 1/210]
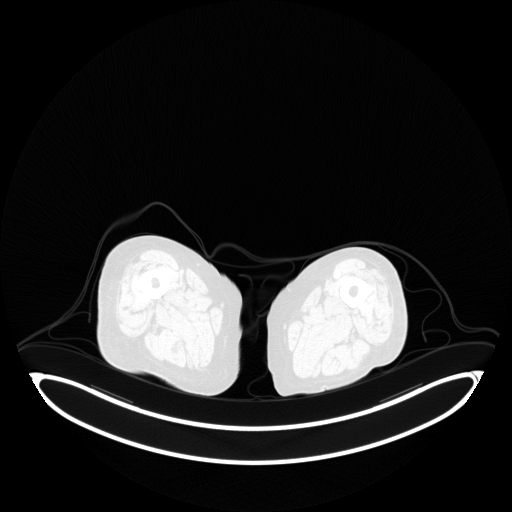
[im 53/210]
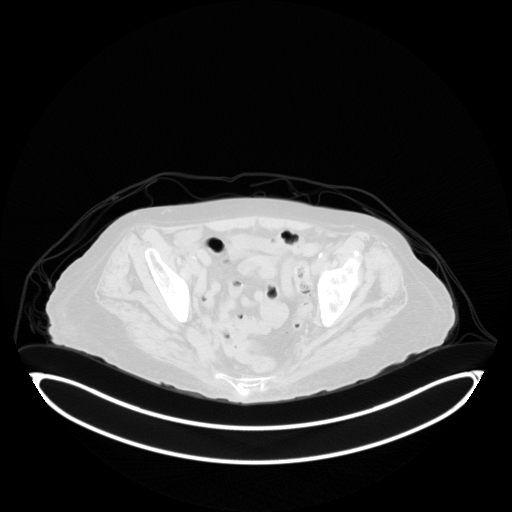
[im 105/210]
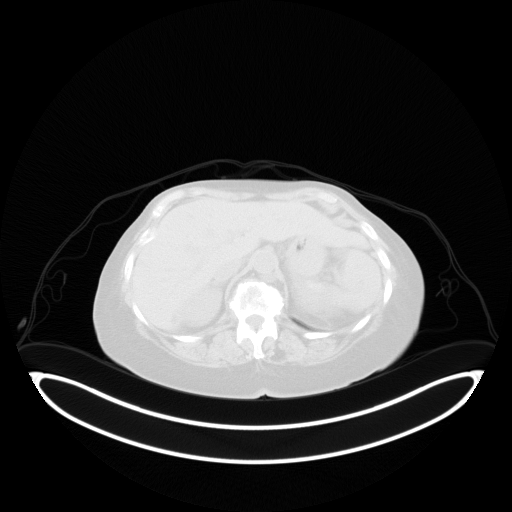
[im 157/210]
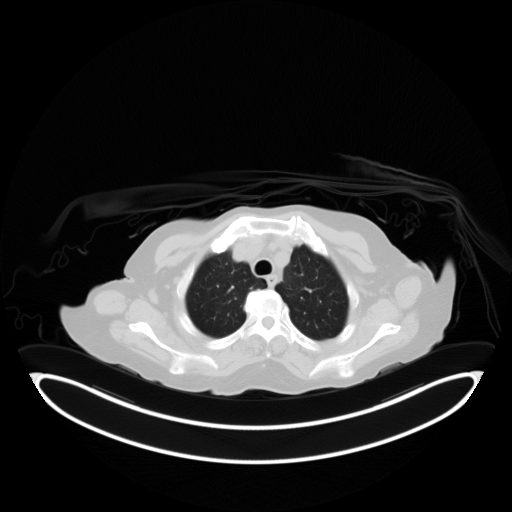
[im 210/210  brain]
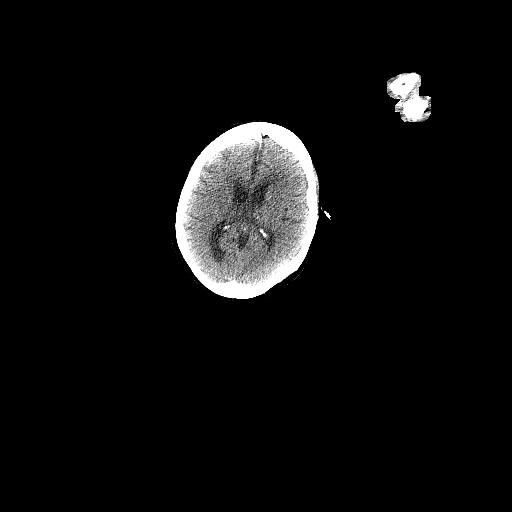

[Series 5: pet sk_thigh nac · axial · 5.0mm · 4.07mm/px · z∈[-516,+320]mm · 5 of 210 slices shown]
[im 1/210]
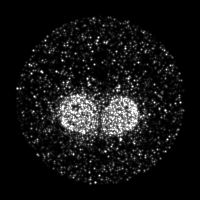
[im 53/210]
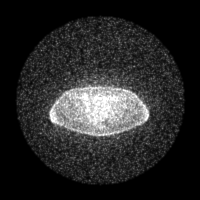
[im 105/210]
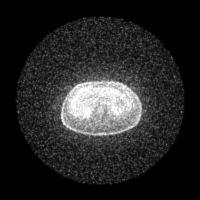
[im 157/210]
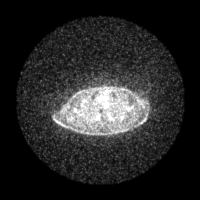
[im 210/210]
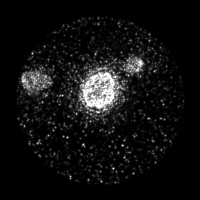

[Series 10: ct sk_thigh 5.0 br59 (id)_bone · axial · 5.0mm · 0.67mm/px · z∈[-110,+166]mm · 2 of 70 slices shown]
[im 1/70]
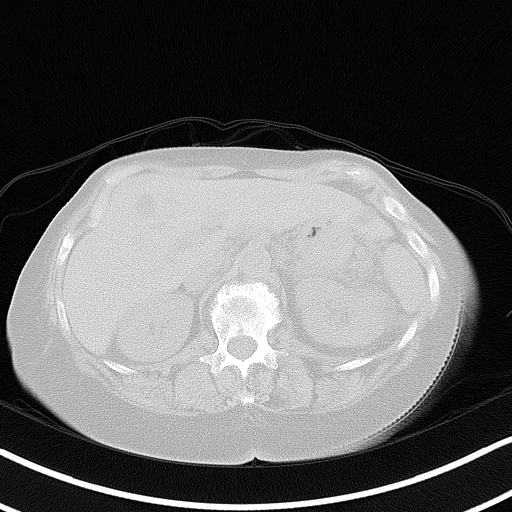
[im 70/70]
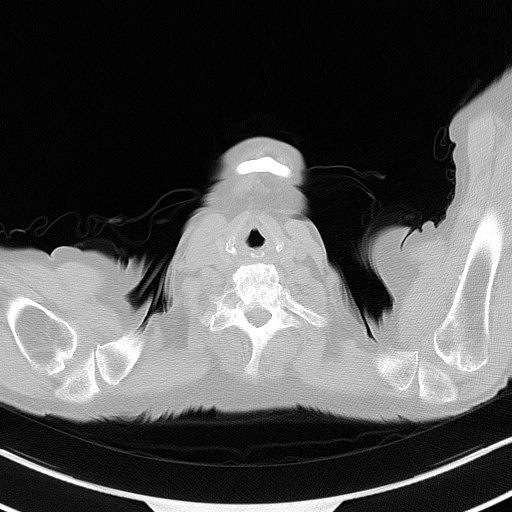

[Series 603: <mip collection> · coronal · 1.74mm/px · 1 of 32 slices shown]
[im 1/32]
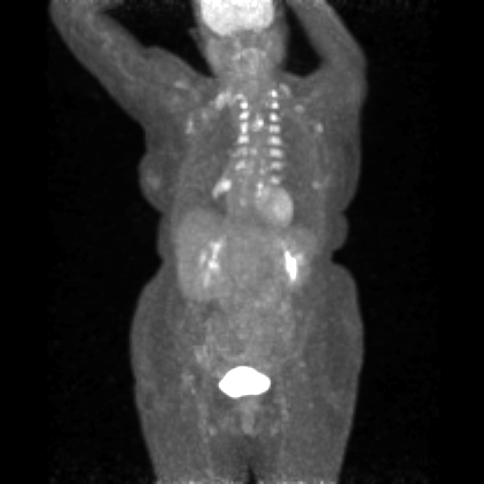

[Series 605: fused cor · 1 of 49 slices shown]
[im 1/49]
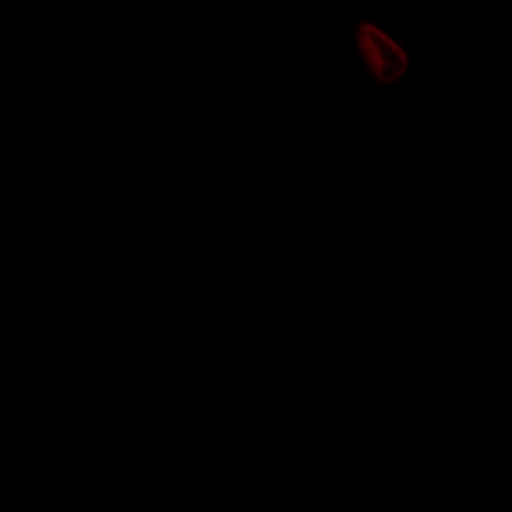

[Series 606: range-ct sk_thigh 5.0 bf37-tra-<alpha range> · 5 of 204 slices shown]
[im 1/204]
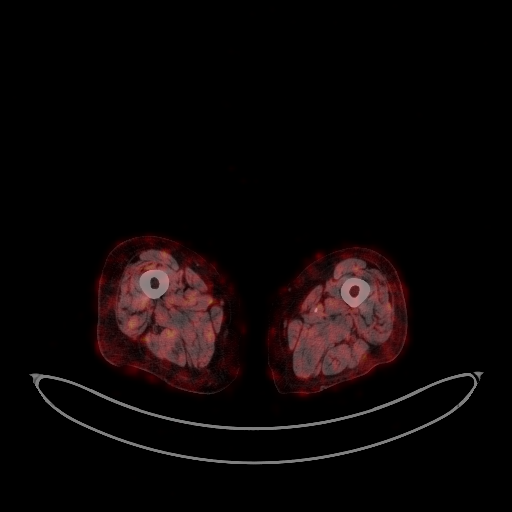
[im 51/204]
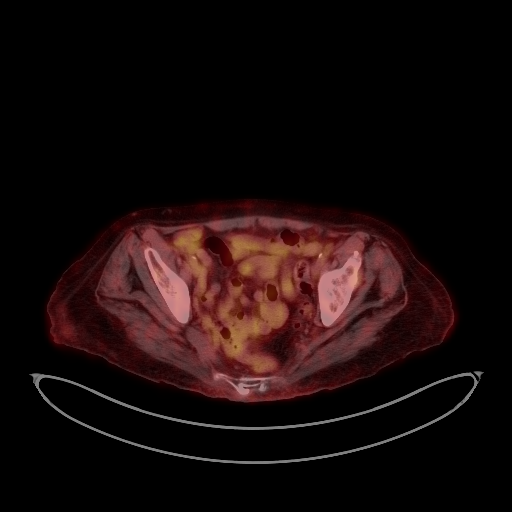
[im 102/204]
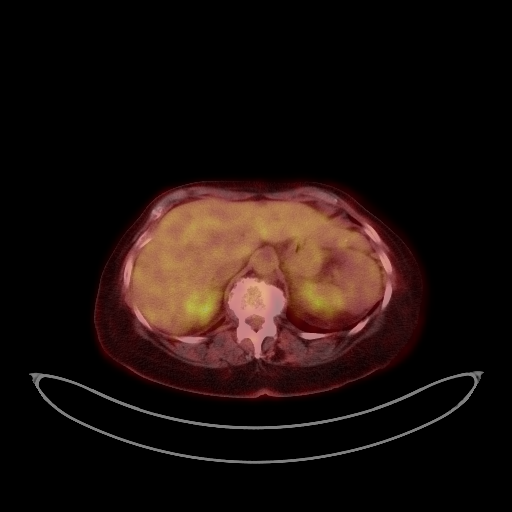
[im 153/204]
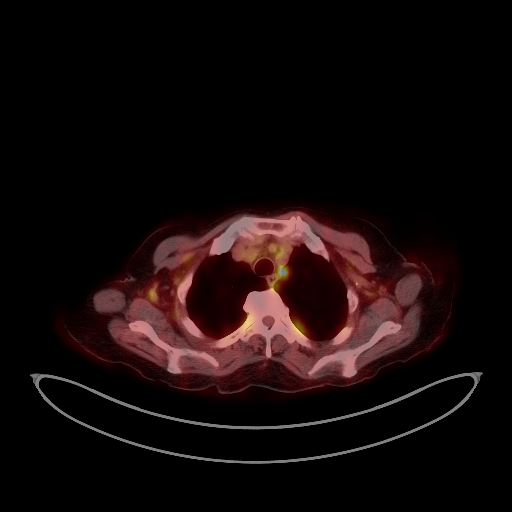
[im 204/204]
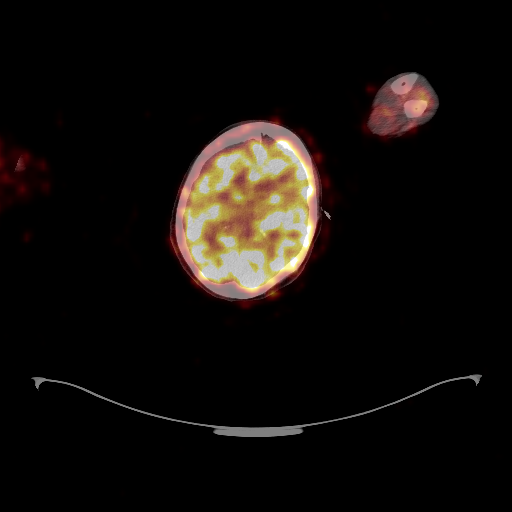

[25 of 25 positions shown; findings below may reference images not displayed]

FINDINGS: Mediastinal blood pool activity: SUV max

Liver activity: SUV max NA

NECK: The abnormal signal along the left side of the C3-4 vertebral
levels on MRI of 06/07/2021 does not correspond to abnormal
accentuated metabolic activity does appear to correspond to
substantial spurring in this vicinity, and accordingly is thought to
be benign.

There is scattered activity in hypermetabolic brown fat in the
posterior spare spinal region and extending into the upper shoulder
region and lower anterior neck region. No abnormal findings along
these foci of accentuated activity to suggest malignancy.

Incidental CT findings: Mild bilateral common carotid
atherosclerotic calcification.

CHEST: The dominant medial right middle lobe nodule measures about
2.4 cm in diameter and has a maximum SUV of 8.4, compatible with
malignancy. Ground-glass density distal to this lesion likely from
postobstructive pneumonitis. 6 by 4 mm right middle lobe nodule on
image 37 series 10 has a maximum SUV of 0.9, but is below sensitive
PET-CT size thresholds.

Scattered foci accentuated activity in the mediastinum, medial
intercostal regions, periaortic region do not have corresponding CT
findings and are ascribed to hypermetabolic brown fat. This is also
present in the shoulder and lower neck region.

Asymmetric density in the medial retroareolar region of the left
breast measuring about 2.0 by 1.1 cm on image 80 series 4, maximum
SUV 2.9.

0.6 cm left axillary lymph node, maximum SUV 3.9. 0.6 cm right
axillary lymph node with maximum SUV 4.3.

Incidental CT findings: Coronary, aortic arch, and branch vessel
atherosclerotic vascular disease.

ABDOMEN/PELVIS: A right inguinal lymph node measuring 0.5 cm in
diameter on image 170 series 4 has maximum SUV of 2.8.

Incidental CT findings: Photopenic hepatic cysts. Atherosclerosis is
present, including aortoiliac atherosclerotic disease. Scattered
sigmoid colon diverticula.

SKELETON: No significant abnormal hypermetabolic activity in this
region.

Incidental CT findings: Old healed right pelvic fracture. Lower
lumbar spondylosis and degenerative disc disease. Grade 1
anterolisthesis at L4-5.
IMPRESSION: 1. Hypermetabolic 2.4 cm right middle lobe nodule, maximum SUV 8.4,
compatible with malignancy. Adjacent postobstructive pneumonitis.
2. A 6 by 4 mm right middle lobe nodule is not hypermetabolic but is
below sensitive PET-CT size thresholds.
3. No compelling findings of active metastatic disease; sensitivity
for small metastatic lesions is reduced due to extensive multifocal
hypermetabolic brown fat in the neck and chest which causes
scattered spurious high uptake.
4. Central lower inner quadrant hypermetabolic activity in the left
breast associated with asymmetric density. This may be a reflection
of recent workup with surgical excision of intraductal papilloma
documented on prior exams from earlier this year.
5. Comment was made on prior MRI of signal along the left side of
the C3-4 vertebral level; this appears to represent benign spurring
and has no hypermetabolic activity on today's PET-CT.
6. Other imaging findings of potential clinical significance: Aortic
Atherosclerosis ([id number].T). Coronary atherosclerosis. Photopenic
hepatic cysts. Lumbar spondylosis and degenerative disc disease.
